# Patient Record
Sex: Female | Born: 1940 | Race: White | Hispanic: No | Marital: Married | Smoking: Never smoker
Health system: Southern US, Community
[De-identification: ages and names within clinical notes are randomized; demographics above are authoritative.]

## PROBLEM LIST (undated history)

## (undated) DIAGNOSIS — C4490 Unspecified malignant neoplasm of skin, unspecified: Secondary | ICD-10-CM

## (undated) DIAGNOSIS — G629 Polyneuropathy, unspecified: Secondary | ICD-10-CM

## (undated) DIAGNOSIS — T7840XA Allergy, unspecified, initial encounter: Secondary | ICD-10-CM

## (undated) DIAGNOSIS — J302 Other seasonal allergic rhinitis: Secondary | ICD-10-CM

## (undated) DIAGNOSIS — E039 Hypothyroidism, unspecified: Secondary | ICD-10-CM

## (undated) DIAGNOSIS — M1990 Unspecified osteoarthritis, unspecified site: Secondary | ICD-10-CM

## (undated) DIAGNOSIS — I4819 Other persistent atrial fibrillation: Secondary | ICD-10-CM

## (undated) DIAGNOSIS — I10 Essential (primary) hypertension: Secondary | ICD-10-CM

## (undated) DIAGNOSIS — IMO0002 Reserved for concepts with insufficient information to code with codable children: Secondary | ICD-10-CM

## (undated) DIAGNOSIS — G709 Myoneural disorder, unspecified: Secondary | ICD-10-CM

## (undated) DIAGNOSIS — G43909 Migraine, unspecified, not intractable, without status migrainosus: Secondary | ICD-10-CM

## (undated) DIAGNOSIS — K219 Gastro-esophageal reflux disease without esophagitis: Secondary | ICD-10-CM

## (undated) DIAGNOSIS — E785 Hyperlipidemia, unspecified: Secondary | ICD-10-CM

## (undated) DIAGNOSIS — I428 Other cardiomyopathies: Secondary | ICD-10-CM

## (undated) HISTORY — DX: Other seasonal allergic rhinitis: J30.2

## (undated) HISTORY — DX: Other cardiomyopathies: I42.8

## (undated) HISTORY — DX: Unspecified osteoarthritis, unspecified site: M19.90

## (undated) HISTORY — DX: Myoneural disorder, unspecified: G70.9

## (undated) HISTORY — PX: CATARACT EXTRACTION, BILATERAL: SHX1313

## (undated) HISTORY — DX: Essential (primary) hypertension: I10

## (undated) HISTORY — DX: Hypothyroidism, unspecified: E03.9

## (undated) HISTORY — DX: Other persistent atrial fibrillation: I48.19

## (undated) HISTORY — DX: Allergy, unspecified, initial encounter: T78.40XA

## (undated) HISTORY — DX: Polyneuropathy, unspecified: G62.9

## (undated) HISTORY — PX: COLONOSCOPY: SHX174

## (undated) HISTORY — DX: Gastro-esophageal reflux disease without esophagitis: K21.9

## (undated) HISTORY — PX: POLYPECTOMY: SHX149

## (undated) HISTORY — DX: Unspecified malignant neoplasm of skin, unspecified: C44.90

## (undated) HISTORY — DX: Reserved for concepts with insufficient information to code with codable children: IMO0002

## (undated) HISTORY — DX: Migraine, unspecified, not intractable, without status migrainosus: G43.909

## (undated) HISTORY — DX: Hyperlipidemia, unspecified: E78.5

---

## 1976-03-20 HISTORY — PX: TOTAL ABDOMINAL HYSTERECTOMY: SHX209

## 1999-07-14 ENCOUNTER — Other Ambulatory Visit: Admission: RE | Admit: 1999-07-14 | Discharge: 1999-07-14 | Payer: Self-pay | Admitting: Internal Medicine

## 1999-07-14 ENCOUNTER — Encounter (INDEPENDENT_AMBULATORY_CARE_PROVIDER_SITE_OTHER): Payer: Self-pay | Admitting: Specialist

## 2000-07-31 ENCOUNTER — Other Ambulatory Visit: Admission: RE | Admit: 2000-07-31 | Discharge: 2000-07-31 | Payer: Self-pay | Admitting: Obstetrics & Gynecology

## 2001-03-20 DIAGNOSIS — C4490 Unspecified malignant neoplasm of skin, unspecified: Secondary | ICD-10-CM | POA: Insufficient documentation

## 2001-03-20 HISTORY — DX: Unspecified malignant neoplasm of skin, unspecified: C44.90

## 2001-08-15 ENCOUNTER — Other Ambulatory Visit: Admission: RE | Admit: 2001-08-15 | Discharge: 2001-08-15 | Payer: Self-pay | Admitting: Obstetrics & Gynecology

## 2002-05-14 ENCOUNTER — Ambulatory Visit (HOSPITAL_COMMUNITY): Admission: RE | Admit: 2002-05-14 | Discharge: 2002-05-14 | Payer: Self-pay | Admitting: Neurosurgery

## 2002-05-14 ENCOUNTER — Encounter: Payer: Self-pay | Admitting: Neurosurgery

## 2002-08-25 ENCOUNTER — Other Ambulatory Visit: Admission: RE | Admit: 2002-08-25 | Discharge: 2002-08-25 | Payer: Self-pay | Admitting: Obstetrics & Gynecology

## 2003-03-21 HISTORY — PX: CERVICAL DISCECTOMY: SHX98

## 2003-06-24 ENCOUNTER — Inpatient Hospital Stay (HOSPITAL_COMMUNITY): Admission: RE | Admit: 2003-06-24 | Discharge: 2003-06-27 | Payer: Self-pay | Admitting: Neurosurgery

## 2003-09-18 ENCOUNTER — Other Ambulatory Visit: Admission: RE | Admit: 2003-09-18 | Discharge: 2003-09-18 | Payer: Self-pay | Admitting: Obstetrics & Gynecology

## 2004-01-20 ENCOUNTER — Ambulatory Visit: Payer: Self-pay | Admitting: Internal Medicine

## 2004-02-03 ENCOUNTER — Ambulatory Visit: Payer: Self-pay | Admitting: Internal Medicine

## 2004-02-10 ENCOUNTER — Encounter: Payer: Self-pay | Admitting: Internal Medicine

## 2004-02-10 ENCOUNTER — Ambulatory Visit (HOSPITAL_COMMUNITY): Admission: RE | Admit: 2004-02-10 | Discharge: 2004-02-10 | Payer: Self-pay | Admitting: Internal Medicine

## 2004-03-15 ENCOUNTER — Ambulatory Visit: Payer: Self-pay | Admitting: Internal Medicine

## 2004-03-20 HISTORY — PX: CHOLECYSTECTOMY: SHX55

## 2004-05-03 ENCOUNTER — Observation Stay (HOSPITAL_COMMUNITY): Admission: RE | Admit: 2004-05-03 | Discharge: 2004-05-04 | Payer: Self-pay | Admitting: Surgery

## 2004-05-03 ENCOUNTER — Encounter (INDEPENDENT_AMBULATORY_CARE_PROVIDER_SITE_OTHER): Payer: Self-pay | Admitting: *Deleted

## 2004-10-19 ENCOUNTER — Other Ambulatory Visit: Admission: RE | Admit: 2004-10-19 | Discharge: 2004-10-19 | Payer: Self-pay | Admitting: Obstetrics & Gynecology

## 2007-08-21 DIAGNOSIS — R519 Headache, unspecified: Secondary | ICD-10-CM

## 2007-08-21 DIAGNOSIS — I10 Essential (primary) hypertension: Secondary | ICD-10-CM | POA: Insufficient documentation

## 2007-08-21 DIAGNOSIS — Z860100 Personal history of colon polyps, unspecified: Secondary | ICD-10-CM | POA: Insufficient documentation

## 2007-08-21 DIAGNOSIS — E785 Hyperlipidemia, unspecified: Secondary | ICD-10-CM | POA: Insufficient documentation

## 2007-08-21 DIAGNOSIS — K5730 Diverticulosis of large intestine without perforation or abscess without bleeding: Secondary | ICD-10-CM

## 2007-08-21 DIAGNOSIS — R51 Headache: Secondary | ICD-10-CM

## 2007-08-21 DIAGNOSIS — Z87898 Personal history of other specified conditions: Secondary | ICD-10-CM | POA: Insufficient documentation

## 2007-08-21 DIAGNOSIS — K219 Gastro-esophageal reflux disease without esophagitis: Secondary | ICD-10-CM | POA: Insufficient documentation

## 2007-08-21 HISTORY — DX: Diverticulosis of large intestine without perforation or abscess without bleeding: K57.30

## 2007-08-21 HISTORY — DX: Personal history of colon polyps, unspecified: Z86.0100

## 2007-08-21 HISTORY — DX: Headache, unspecified: R51.9

## 2007-08-22 ENCOUNTER — Ambulatory Visit: Payer: Self-pay | Admitting: Internal Medicine

## 2007-08-22 DIAGNOSIS — M1990 Unspecified osteoarthritis, unspecified site: Secondary | ICD-10-CM | POA: Insufficient documentation

## 2007-08-22 DIAGNOSIS — M129 Arthropathy, unspecified: Secondary | ICD-10-CM | POA: Insufficient documentation

## 2007-08-22 DIAGNOSIS — M259 Joint disorder, unspecified: Secondary | ICD-10-CM

## 2007-08-22 DIAGNOSIS — M5416 Radiculopathy, lumbar region: Secondary | ICD-10-CM

## 2007-08-22 DIAGNOSIS — E039 Hypothyroidism, unspecified: Secondary | ICD-10-CM | POA: Insufficient documentation

## 2007-08-22 HISTORY — DX: Radiculopathy, lumbar region: M54.16

## 2007-08-22 HISTORY — DX: Joint disorder, unspecified: M25.9

## 2007-08-27 ENCOUNTER — Encounter: Payer: Self-pay | Admitting: Cardiology

## 2007-08-27 ENCOUNTER — Ambulatory Visit: Payer: Self-pay | Admitting: Surgery

## 2007-08-27 ENCOUNTER — Ambulatory Visit (HOSPITAL_COMMUNITY): Admission: RE | Admit: 2007-08-27 | Discharge: 2007-08-27 | Payer: Self-pay | Admitting: Internal Medicine

## 2007-08-30 ENCOUNTER — Encounter: Payer: Self-pay | Admitting: Internal Medicine

## 2007-08-30 ENCOUNTER — Ambulatory Visit: Payer: Self-pay | Admitting: Internal Medicine

## 2007-09-02 ENCOUNTER — Encounter: Payer: Self-pay | Admitting: Internal Medicine

## 2008-11-12 ENCOUNTER — Ambulatory Visit: Payer: Self-pay | Admitting: Internal Medicine

## 2008-11-12 DIAGNOSIS — D126 Benign neoplasm of colon, unspecified: Secondary | ICD-10-CM

## 2008-11-12 HISTORY — DX: Benign neoplasm of colon, unspecified: D12.6

## 2008-12-18 HISTORY — PX: TRANSTHORACIC ECHOCARDIOGRAM: SHX275

## 2009-03-15 ENCOUNTER — Telehealth: Payer: Self-pay | Admitting: Internal Medicine

## 2010-04-10 ENCOUNTER — Encounter: Payer: Self-pay | Admitting: Obstetrics & Gynecology

## 2010-08-05 NOTE — H&P (Signed)
NAME:  Bridget [name], Bridget [name]                         ACCOUNT NO.:  [id number]   MEDICAL RECORD NO.:  [id number]                   PATIENT TYPE:  INP   LOCATION:  NA                                   FACILITY:  [facility]   PHYSICIAN:  Hilda Lias, M.D.                DATE OF BIRTH:  12-09-1940   DATE OF ADMISSION:  DATE OF DISCHARGE:                                HISTORY & PHYSICAL   There was no dictation for this job.                                                Hilda Lias, M.D.    EB/MEDQ  D:  06/24/2003  T:  06/24/2003  Job:  [id number]

## 2010-08-05 NOTE — Op Note (Signed)
[name], [name]                         ACCOUNT NO.:  [id number]   MEDICAL RECORD NO.:  [id number]                   PATIENT TYPE:  INP   LOCATION:  2899                                 FACILITY:  [facility]   PHYSICIAN:  Hilda Lias, M.D.                DATE OF BIRTH:  July 04, 1940   DATE OF PROCEDURE:  06/24/2003  DATE OF DISCHARGE:                                 OPERATIVE REPORT   PREOPERATIVE DIAGNOSIS:  Cervical spondylosis, C3-4, 4-5, 5-6, 6-7 with  radiculopathy.   POSTOPERATIVE DIAGNOSES:  Cervical spondylosis, C3-4, 4-5, 5-6, 6-7 with  radiculopathy.   PROCEDURE:  Anterior 3-4, 4-5, 5-6, 6-7 decompression of the spinal column,  total diskectomy, bilateral foraminotomy, interbody fusion with allograft,  plate for C3 to C7, microscope.   SURGEON:  Hilda Lias, M.D.   ASSISTANT:  Dr. Newell Coral   CLINICAL HISTORY:  Bridget [name] is a lady who had been complaining of neck  pain with radiation to both upper extremities, right worse than the left  one.  Clinical history has weakness of the biceps, right tricep and neck  with extension, lateral recession.  X-ray showed severe case of spondylosis  at the level of 5-6, 6-7.  At level 4-5, 3-4, the patient already has some  narrowing centrally, as well as prominent stenosis.  We talked about surgery  and at the end she and her husband wanted to proceed with all four levels  because she did not want to have any more surgical procedures.  The patient  was aware that if we were to do only 2-3 level, the level of one below will  get worse.  The risk was explained in the history and physical, as well as  on my physical examination in my office.   PROCEDURE:  The patient was taken to the __________ ; the neck was prepped  with Betadine.  Longitudinal incision through the skin, platysma was carried  out.  X-ray showed indeed we were at the level of 5-6.  The patient had  quite a bit of osteophytes at the level of 4-5, 5-6, 6-7.  The  osteophytes  were removed with the Leksell.  Then, we entered the C6-7 space.  The area  was quite narrow up to the point that the disks were absolutely  degenerative.  We had to open the disk space using the drill.  With the  microscope, we were able to remove the posterior ligament and decompression  of the spinal cord at level 6-7 plus foraminotomy with removal of  osteophyte in that area was accomplished.  The same procedure was done at 5-  6.  The area was quite narrow.  At level 4-5, the patient also had continued  osteophytes and quite a bit central and laterally narrow secondary to  spondylosis.  Decompression of the spinal cord at this level, as well as the  level of 3-4 was accomplished.  At the end, we had good decompression of the  spinal column.  The nerve root #3, 4, 5, 6 and 7 were wide open.  Having  done this, hemostasis was done with the Gelfoam.  After patient had no more  evidence of bleeding, the Gelfoam was removed.  The area was irrigated.  The  end plate of this level was drilled, and four pieces of allograft, 6 mm  __________ were inserted.  This was followed by a plate from C3 down to C7  using pin and screws.  The lateral C spine showed good position of the  plate, as well as the bone graft.  From that level, the area was irrigated.  We waited 10 minutes and there was no evidence of any bleeding.  Nevertheless, because of the __________ we decided to leave a Jackson-Pratt  drain.  The area was closed using Vicryl and Steri-Strips.                                               Hilda Lias, M.D.   EB/MEDQ  D:  06/24/2003  T:  06/25/2003  Job:  [id number]

## 2010-08-05 NOTE — H&P (Signed)
NAME:  Bridget [name], Bridget [name]                         ACCOUNT NO.:  [id number]   MEDICAL RECORD NO.:  [id number]                   PATIENT TYPE:  INP   LOCATION:  3004                                 FACILITY:  [facility]   PHYSICIAN:  Hilda Lias, M.D.                DATE OF BIRTH:  10-04-1940   DATE OF ADMISSION:  06/24/2003  DATE OF DISCHARGE:                                HISTORY & PHYSICAL   HISTORY:  This is a lady who came to see me two months ago because of neck  pain with pain going to the right upper extremity, associated with numbness,  weakness and difficulty driving.  Some time lately she had been developing  pain in the left upper extremity.  She complained of some pain which had  mostly also localized to the hip area.  The patient denied weakness in the  lower extremity, but she is getting weak in the right upper extremity.   About eight years ago, I saw this lady and the diagnoses of 5-6, 6-7  spondylosis was done.  She denies any problem with bladder or bowel.  She  feels that she is getting worse lately.  The patient had cervical spine MRI  and because of the findings, she wanted to proceed with surgery.   PAST MEDICAL HISTORY:  Hysterectomy, appendectomy.   ALLERGIES:  CODEINE, SULFA AND BELLERGAL.   SOCIAL HISTORY:  Negative.   FAMILY HISTORY:  Mother died of a stroke.  Father died of heart disease when  he was 69 years old.   PHYSICAL EXAMINATION:  HEENT:  Head, ears and  throat normal.  Nose is  clear.  NECK:  She is able to flex, but extension and left rotation causes  discomfort. There is some tenderness in the trapezius muscle.  HEART:  Sounds are normal.  ABDOMEN:  Normal.  NEUROLOGIC:  Neuromotor is grossly normal.  Cranial nerves normal.  Strength:  She has a normal deltoid.  I can break easily both biceps and  both wrist extensors. She has a 3/5 weakness in the right triceps with  normal in the left one.  Thenar and hypothenar muscles are normal.   Sensory  examination normal.  Reflexes are symmetrical with decrease in both biceps.  Coordination normal.   Cervical spine x-ray showed spondylosis at the level of 3-4, 4-5, 5-6, 6-7.  The MRI showed that she has spondylosis, not only at 5-6 and 6-7, but also  between 4-5 and 3-4.   IMPRESSION:  Cervical spondylosis from C3 down to C6-7.   RECOMMENDATION:  I talked to her and her husband on different occasions.  There is no question that clinically, her problems are coming from the 5-6  and 6-7, but I worry about the mild spondylosis, and foraminal stenosis that  she has at the level of 3-4, and 4-5.  We talked about doing only those two  lower levels and  see how she does with the other two although she knows that  in the future, this might get worse because of the fusion and she might  require a second surgical procedure.  Nevertheless, after several talks to  both of them, they wanted to proceed with a full  level of the cervical discectomy.  The surgery was fully explained to them  on several occasions including the risks such as damage to the vertebral  artery, stroke, damage to the soft palate or trachea, failure of the bone  graft, failure of the plate and need for further surgery, and no improvement  whatsoever.                                                Hilda Lias, M.D.    EB/MEDQ  D:  06/24/2003  T:  06/25/2003  Job:  [id number]

## 2010-08-05 NOTE — Op Note (Signed)
NAMESHABNAM, SHILLINGFORD               ACCOUNT NO.:  [id number]   MEDICAL RECORD NO.:  [id number]          PATIENT TYPE:  AMB   LOCATION:  DAY                          FACILITY:  [facility]   PHYSICIAN:  Sandria Bales. Ezzard Standing, M.D.  DATE OF BIRTH:  1940/03/23   DATE OF PROCEDURE:  05/03/2004  DATE OF DISCHARGE:                                 OPERATIVE REPORT   PREOPERATIVE DIAGNOSIS:  Chronic cholecystitis with cholelithiasis.   POSTOPERATIVE DIAGNOSIS:  Chronic cholecystitis with cholelithiasis.   PROCEDURES:  1.  Laparoscopic cholecystectomy.  2.  Intraoperative cholangiogram.   SURGEON:  Sandria Bales. Ezzard Standing, M.D.   FIRST ASSISTANT:  Gita Kudo, M.D.   ANESTHESIA:  General endotracheal.   ESTIMATED BLOOD LOSS:  Minimal.   INDICATION FOR PROCEDURE:  Ms. Heidtke is a 70 year old white female who is a  patient of Dr. Lawana Pai in [place], [place].  She sees Lina Sar,  M.D.  She has had some vague abdominal pain.  She had a CT scan which  revealed a 2.3 cm gallstone.  She now comes for attempted laparoscopic  cholecystectomy.  The indications and potential complications were explained  to the patient.   The potential complications, include, but are not limited to bleeding,  infection, bile duct injury and open surgery.   OPERATIVE NOTE:  The patient was placed in the supine position and given a  general endotracheal anesthetic as supervised by Jenelle Mages. Rica Mast, M.D.  Her abdomen was prepped with Betadine solution and sterilely draped.  She  was given 1 g of Ancef at the initiation of the procedure.   I used the [place] Surgical port.  I used the 10 mm Husson for umbilicus.  I used  a 10 mm subxiphoid and then a right mid subcostal 5 mm and right lateral  subcostal 5 mm.   I first did an exploration.  The right and left lobes of the liver were  unremarkable.  The stomach was unremarkable.  The bowel that  I see was  unremarkable.  The gallbladder was identified.  I grasped the  gallbladder  and rotated it cephalad.  Interestingly, the patient had sort of a  __________ deformity of the gallbladder at the junction of the cystic duct.  This was sort of attached to the common bile duct.  I divided these off.   I then placed a clip on the gallbladder side of the cystic duct.  I then  shot an intraoperative cholangiogram.   Intraoperative cholangiogram was shot using a cut off taut catheter placed  through a 14 gauge Jelco into the cut side of the cystic duct.  A clip was  then used to secure the taut catheter.   I used half-strength Hypaque solution about 6 mL and injected this into the  cystic duct, down the common bile duct and into the duodenum.  It also went  up the hepatic radicles.  The hepatic bile duct was noted to be somewhat  dilated and large with no filling defect and it emptied promptly into the  duodenum.   The cystic duct was then  triply endoclipped and divided.  The cystic artery  was doubly endoclipped and divided.   The gallbladder was divided from the gallbladder bed.  The triangle of Calot  and the gallbladder bed were visualized.  There was no bleeding and no bile  leak.  It was placed into an EndoCatch bag delivered through the umbilicus.   The umbilical port was then closed with a 0 Vicryl suture.  The abdomen was  then irrigated.  The umbilical port was closed with a 0 Vicryl suture.  The  skin at each site was closed with a 5-0 Monocryl suture and painted with  tincture of Benzoin and steri-stripped.   The patient tolerated the procedure well and was transported to the recovery  room in good condition.  The sponge and needle counts were correct at the  end of the case.      DHN/MEDQ  D:  05/03/2004  T:  05/03/2004  Job:  [id number]   cc:   Lina Sar, M.D. [facility]   Roney Marion, M.D.  [place], [place]

## 2010-08-05 NOTE — Discharge Summary (Signed)
NAMENORELLE, RUNNION                         ACCOUNT NO.:  [id number]   MEDICAL RECORD NO.:  [id number]                   PATIENT TYPE:  INP   LOCATION:  3004                                 FACILITY:  [facility]   PHYSICIAN:  Hilda Lias, M.D.                DATE OF BIRTH:  1940-11-24   DATE OF ADMISSION:  06/24/2003  DATE OF DISCHARGE:  06/27/2003                                 DISCHARGE SUMMARY   ADMISSION DIAGNOSIS:  C3-C4, C4-C5, C5-C6, and C6-C7 spondylosis with  stenosis.   FINAL DIAGNOSIS:  C3-C4, C4-C5, C5-C6, and C6-C7 spondylosis with stenosis.   CLINICAL HISTORY:  The patient was admitted because of neck pain with  radiation to the upper extremity associated with weakness.   X-ray showed stenosis from C3-C4 down to C6-C7.  Surgery was advised.   LABORATORY DATA:  Normal.   HOSPITAL COURSE:  The patient was taken to surgery and anterior cervical  decompression was done.  After that, the patient had a drain inserted  because of a small amount of epidural __________ bleeding.   The patient was stable, and 24 hours later, the drain was removed.  The  patient was able to walk without any difficulty.   By February 9, she was stable, and she was ready to go home.   DISCHARGE CONDITION:  Improvement.   MEDICATIONS:  1. Percocet.  2. Diazepam.   DISCHARGE DIET:  Regular.   DISCHARGE ACTIVITY:  She was not to drive for at least 10 days.   FOLLOWUP:  She will be seen by me in four weeks.                                                Hilda Lias, M.D.    EB/MEDQ  D:  07/17/2003  T:  07/18/2003  Job:  [id number]

## 2011-04-04 DIAGNOSIS — M169 Osteoarthritis of hip, unspecified: Secondary | ICD-10-CM | POA: Diagnosis not present

## 2011-04-19 DIAGNOSIS — Z23 Encounter for immunization: Secondary | ICD-10-CM | POA: Diagnosis not present

## 2011-04-19 DIAGNOSIS — I10 Essential (primary) hypertension: Secondary | ICD-10-CM | POA: Diagnosis not present

## 2011-04-19 DIAGNOSIS — E782 Mixed hyperlipidemia: Secondary | ICD-10-CM | POA: Diagnosis not present

## 2011-04-19 DIAGNOSIS — R7301 Impaired fasting glucose: Secondary | ICD-10-CM | POA: Diagnosis not present

## 2011-04-20 DIAGNOSIS — I10 Essential (primary) hypertension: Secondary | ICD-10-CM | POA: Diagnosis not present

## 2011-04-20 DIAGNOSIS — E039 Hypothyroidism, unspecified: Secondary | ICD-10-CM | POA: Diagnosis not present

## 2011-04-20 DIAGNOSIS — E782 Mixed hyperlipidemia: Secondary | ICD-10-CM | POA: Diagnosis not present

## 2011-04-21 HISTORY — PX: TOTAL HIP ARTHROPLASTY: SHX124

## 2011-04-24 DIAGNOSIS — L538 Other specified erythematous conditions: Secondary | ICD-10-CM | POA: Diagnosis not present

## 2011-04-24 DIAGNOSIS — L821 Other seborrheic keratosis: Secondary | ICD-10-CM | POA: Diagnosis not present

## 2011-04-24 DIAGNOSIS — L570 Actinic keratosis: Secondary | ICD-10-CM | POA: Diagnosis not present

## 2011-05-03 DIAGNOSIS — G5790 Unspecified mononeuropathy of unspecified lower limb: Secondary | ICD-10-CM | POA: Diagnosis not present

## 2011-05-03 DIAGNOSIS — I10 Essential (primary) hypertension: Secondary | ICD-10-CM | POA: Diagnosis not present

## 2011-05-03 DIAGNOSIS — Z01818 Encounter for other preprocedural examination: Secondary | ICD-10-CM | POA: Diagnosis not present

## 2011-05-03 DIAGNOSIS — M169 Osteoarthritis of hip, unspecified: Secondary | ICD-10-CM | POA: Diagnosis not present

## 2011-05-03 DIAGNOSIS — F329 Major depressive disorder, single episode, unspecified: Secondary | ICD-10-CM | POA: Diagnosis not present

## 2011-05-03 DIAGNOSIS — G43909 Migraine, unspecified, not intractable, without status migrainosus: Secondary | ICD-10-CM | POA: Diagnosis not present

## 2011-05-03 DIAGNOSIS — Z79899 Other long term (current) drug therapy: Secondary | ICD-10-CM | POA: Diagnosis not present

## 2011-05-03 DIAGNOSIS — E039 Hypothyroidism, unspecified: Secondary | ICD-10-CM | POA: Diagnosis not present

## 2011-05-03 DIAGNOSIS — E785 Hyperlipidemia, unspecified: Secondary | ICD-10-CM | POA: Diagnosis not present

## 2011-05-11 DIAGNOSIS — G5790 Unspecified mononeuropathy of unspecified lower limb: Secondary | ICD-10-CM | POA: Diagnosis present

## 2011-05-11 DIAGNOSIS — E785 Hyperlipidemia, unspecified: Secondary | ICD-10-CM | POA: Diagnosis present

## 2011-05-11 DIAGNOSIS — E039 Hypothyroidism, unspecified: Secondary | ICD-10-CM | POA: Diagnosis present

## 2011-05-11 DIAGNOSIS — M1610 Unilateral primary osteoarthritis, unspecified hip: Secondary | ICD-10-CM | POA: Diagnosis not present

## 2011-05-11 DIAGNOSIS — Z882 Allergy status to sulfonamides status: Secondary | ICD-10-CM | POA: Diagnosis not present

## 2011-05-11 DIAGNOSIS — I10 Essential (primary) hypertension: Secondary | ICD-10-CM | POA: Diagnosis present

## 2011-05-11 DIAGNOSIS — E349 Endocrine disorder, unspecified: Secondary | ICD-10-CM | POA: Diagnosis present

## 2011-05-11 DIAGNOSIS — M169 Osteoarthritis of hip, unspecified: Secondary | ICD-10-CM | POA: Diagnosis not present

## 2011-05-11 DIAGNOSIS — Z96649 Presence of unspecified artificial hip joint: Secondary | ICD-10-CM | POA: Diagnosis not present

## 2011-05-11 DIAGNOSIS — F329 Major depressive disorder, single episode, unspecified: Secondary | ICD-10-CM | POA: Diagnosis present

## 2011-05-11 DIAGNOSIS — Z471 Aftercare following joint replacement surgery: Secondary | ICD-10-CM | POA: Diagnosis not present

## 2011-05-11 DIAGNOSIS — Z886 Allergy status to analgesic agent status: Secondary | ICD-10-CM | POA: Diagnosis not present

## 2011-05-11 DIAGNOSIS — G43909 Migraine, unspecified, not intractable, without status migrainosus: Secondary | ICD-10-CM | POA: Diagnosis present

## 2011-05-11 DIAGNOSIS — Z79899 Other long term (current) drug therapy: Secondary | ICD-10-CM | POA: Diagnosis not present

## 2011-05-14 DIAGNOSIS — IMO0001 Reserved for inherently not codable concepts without codable children: Secondary | ICD-10-CM | POA: Diagnosis not present

## 2011-05-14 DIAGNOSIS — Z96649 Presence of unspecified artificial hip joint: Secondary | ICD-10-CM | POA: Diagnosis not present

## 2011-05-14 DIAGNOSIS — I10 Essential (primary) hypertension: Secondary | ICD-10-CM | POA: Diagnosis not present

## 2011-05-14 DIAGNOSIS — M169 Osteoarthritis of hip, unspecified: Secondary | ICD-10-CM | POA: Diagnosis not present

## 2011-05-14 DIAGNOSIS — Z85828 Personal history of other malignant neoplasm of skin: Secondary | ICD-10-CM | POA: Diagnosis not present

## 2011-05-14 DIAGNOSIS — Z471 Aftercare following joint replacement surgery: Secondary | ICD-10-CM | POA: Diagnosis not present

## 2011-05-15 DIAGNOSIS — Z471 Aftercare following joint replacement surgery: Secondary | ICD-10-CM | POA: Diagnosis not present

## 2011-05-15 DIAGNOSIS — Z96649 Presence of unspecified artificial hip joint: Secondary | ICD-10-CM | POA: Diagnosis not present

## 2011-05-15 DIAGNOSIS — Z85828 Personal history of other malignant neoplasm of skin: Secondary | ICD-10-CM | POA: Diagnosis not present

## 2011-05-15 DIAGNOSIS — I10 Essential (primary) hypertension: Secondary | ICD-10-CM | POA: Diagnosis not present

## 2011-05-15 DIAGNOSIS — IMO0001 Reserved for inherently not codable concepts without codable children: Secondary | ICD-10-CM | POA: Diagnosis not present

## 2011-05-17 DIAGNOSIS — Z85828 Personal history of other malignant neoplasm of skin: Secondary | ICD-10-CM | POA: Diagnosis not present

## 2011-05-17 DIAGNOSIS — IMO0001 Reserved for inherently not codable concepts without codable children: Secondary | ICD-10-CM | POA: Diagnosis not present

## 2011-05-17 DIAGNOSIS — Z96649 Presence of unspecified artificial hip joint: Secondary | ICD-10-CM | POA: Diagnosis not present

## 2011-05-17 DIAGNOSIS — I10 Essential (primary) hypertension: Secondary | ICD-10-CM | POA: Diagnosis not present

## 2011-05-17 DIAGNOSIS — Z471 Aftercare following joint replacement surgery: Secondary | ICD-10-CM | POA: Diagnosis not present

## 2011-05-18 DIAGNOSIS — Z471 Aftercare following joint replacement surgery: Secondary | ICD-10-CM | POA: Diagnosis not present

## 2011-05-18 DIAGNOSIS — IMO0001 Reserved for inherently not codable concepts without codable children: Secondary | ICD-10-CM | POA: Diagnosis not present

## 2011-05-18 DIAGNOSIS — Z96649 Presence of unspecified artificial hip joint: Secondary | ICD-10-CM | POA: Diagnosis not present

## 2011-05-18 DIAGNOSIS — Z85828 Personal history of other malignant neoplasm of skin: Secondary | ICD-10-CM | POA: Diagnosis not present

## 2011-05-18 DIAGNOSIS — I10 Essential (primary) hypertension: Secondary | ICD-10-CM | POA: Diagnosis not present

## 2011-05-22 DIAGNOSIS — I10 Essential (primary) hypertension: Secondary | ICD-10-CM | POA: Diagnosis not present

## 2011-05-22 DIAGNOSIS — Z96649 Presence of unspecified artificial hip joint: Secondary | ICD-10-CM | POA: Diagnosis not present

## 2011-05-22 DIAGNOSIS — IMO0001 Reserved for inherently not codable concepts without codable children: Secondary | ICD-10-CM | POA: Diagnosis not present

## 2011-05-22 DIAGNOSIS — Z471 Aftercare following joint replacement surgery: Secondary | ICD-10-CM | POA: Diagnosis not present

## 2011-05-22 DIAGNOSIS — Z85828 Personal history of other malignant neoplasm of skin: Secondary | ICD-10-CM | POA: Diagnosis not present

## 2011-05-24 DIAGNOSIS — Z85828 Personal history of other malignant neoplasm of skin: Secondary | ICD-10-CM | POA: Diagnosis not present

## 2011-05-24 DIAGNOSIS — Z96649 Presence of unspecified artificial hip joint: Secondary | ICD-10-CM | POA: Diagnosis not present

## 2011-05-24 DIAGNOSIS — IMO0001 Reserved for inherently not codable concepts without codable children: Secondary | ICD-10-CM | POA: Diagnosis not present

## 2011-05-24 DIAGNOSIS — Z471 Aftercare following joint replacement surgery: Secondary | ICD-10-CM | POA: Diagnosis not present

## 2011-05-24 DIAGNOSIS — I10 Essential (primary) hypertension: Secondary | ICD-10-CM | POA: Diagnosis not present

## 2011-05-26 DIAGNOSIS — Z471 Aftercare following joint replacement surgery: Secondary | ICD-10-CM | POA: Diagnosis not present

## 2011-05-26 DIAGNOSIS — IMO0001 Reserved for inherently not codable concepts without codable children: Secondary | ICD-10-CM | POA: Diagnosis not present

## 2011-05-26 DIAGNOSIS — Z96649 Presence of unspecified artificial hip joint: Secondary | ICD-10-CM | POA: Diagnosis not present

## 2011-05-26 DIAGNOSIS — I10 Essential (primary) hypertension: Secondary | ICD-10-CM | POA: Diagnosis not present

## 2011-05-26 DIAGNOSIS — Z85828 Personal history of other malignant neoplasm of skin: Secondary | ICD-10-CM | POA: Diagnosis not present

## 2011-05-30 DIAGNOSIS — Z471 Aftercare following joint replacement surgery: Secondary | ICD-10-CM | POA: Diagnosis not present

## 2011-05-30 DIAGNOSIS — Z96649 Presence of unspecified artificial hip joint: Secondary | ICD-10-CM | POA: Diagnosis not present

## 2011-05-30 DIAGNOSIS — I10 Essential (primary) hypertension: Secondary | ICD-10-CM | POA: Diagnosis not present

## 2011-05-30 DIAGNOSIS — Z85828 Personal history of other malignant neoplasm of skin: Secondary | ICD-10-CM | POA: Diagnosis not present

## 2011-05-30 DIAGNOSIS — IMO0001 Reserved for inherently not codable concepts without codable children: Secondary | ICD-10-CM | POA: Diagnosis not present

## 2011-05-31 DIAGNOSIS — IMO0001 Reserved for inherently not codable concepts without codable children: Secondary | ICD-10-CM | POA: Diagnosis not present

## 2011-05-31 DIAGNOSIS — Z85828 Personal history of other malignant neoplasm of skin: Secondary | ICD-10-CM | POA: Diagnosis not present

## 2011-05-31 DIAGNOSIS — Z471 Aftercare following joint replacement surgery: Secondary | ICD-10-CM | POA: Diagnosis not present

## 2011-05-31 DIAGNOSIS — Z96649 Presence of unspecified artificial hip joint: Secondary | ICD-10-CM | POA: Diagnosis not present

## 2011-05-31 DIAGNOSIS — I10 Essential (primary) hypertension: Secondary | ICD-10-CM | POA: Diagnosis not present

## 2011-06-02 DIAGNOSIS — I10 Essential (primary) hypertension: Secondary | ICD-10-CM | POA: Diagnosis not present

## 2011-06-02 DIAGNOSIS — Z96649 Presence of unspecified artificial hip joint: Secondary | ICD-10-CM | POA: Diagnosis not present

## 2011-06-02 DIAGNOSIS — Z85828 Personal history of other malignant neoplasm of skin: Secondary | ICD-10-CM | POA: Diagnosis not present

## 2011-06-02 DIAGNOSIS — Z471 Aftercare following joint replacement surgery: Secondary | ICD-10-CM | POA: Diagnosis not present

## 2011-06-02 DIAGNOSIS — IMO0001 Reserved for inherently not codable concepts without codable children: Secondary | ICD-10-CM | POA: Diagnosis not present

## 2011-06-05 DIAGNOSIS — IMO0001 Reserved for inherently not codable concepts without codable children: Secondary | ICD-10-CM | POA: Diagnosis not present

## 2011-06-05 DIAGNOSIS — Z96649 Presence of unspecified artificial hip joint: Secondary | ICD-10-CM | POA: Diagnosis not present

## 2011-06-05 DIAGNOSIS — Z85828 Personal history of other malignant neoplasm of skin: Secondary | ICD-10-CM | POA: Diagnosis not present

## 2011-06-05 DIAGNOSIS — I10 Essential (primary) hypertension: Secondary | ICD-10-CM | POA: Diagnosis not present

## 2011-06-05 DIAGNOSIS — Z471 Aftercare following joint replacement surgery: Secondary | ICD-10-CM | POA: Diagnosis not present

## 2011-06-08 DIAGNOSIS — IMO0001 Reserved for inherently not codable concepts without codable children: Secondary | ICD-10-CM | POA: Diagnosis not present

## 2011-06-08 DIAGNOSIS — I10 Essential (primary) hypertension: Secondary | ICD-10-CM | POA: Diagnosis not present

## 2011-06-08 DIAGNOSIS — Z85828 Personal history of other malignant neoplasm of skin: Secondary | ICD-10-CM | POA: Diagnosis not present

## 2011-06-08 DIAGNOSIS — Z96649 Presence of unspecified artificial hip joint: Secondary | ICD-10-CM | POA: Diagnosis not present

## 2011-06-08 DIAGNOSIS — Z471 Aftercare following joint replacement surgery: Secondary | ICD-10-CM | POA: Diagnosis not present

## 2011-06-09 DIAGNOSIS — Z85828 Personal history of other malignant neoplasm of skin: Secondary | ICD-10-CM | POA: Diagnosis not present

## 2011-06-09 DIAGNOSIS — Z471 Aftercare following joint replacement surgery: Secondary | ICD-10-CM | POA: Diagnosis not present

## 2011-06-09 DIAGNOSIS — IMO0001 Reserved for inherently not codable concepts without codable children: Secondary | ICD-10-CM | POA: Diagnosis not present

## 2011-06-09 DIAGNOSIS — Z09 Encounter for follow-up examination after completed treatment for conditions other than malignant neoplasm: Secondary | ICD-10-CM | POA: Diagnosis not present

## 2011-06-09 DIAGNOSIS — Z96649 Presence of unspecified artificial hip joint: Secondary | ICD-10-CM | POA: Diagnosis not present

## 2011-06-09 DIAGNOSIS — I10 Essential (primary) hypertension: Secondary | ICD-10-CM | POA: Diagnosis not present

## 2011-06-21 DIAGNOSIS — G43119 Migraine with aura, intractable, without status migrainosus: Secondary | ICD-10-CM | POA: Diagnosis not present

## 2011-06-21 DIAGNOSIS — M359 Systemic involvement of connective tissue, unspecified: Secondary | ICD-10-CM | POA: Diagnosis not present

## 2011-08-01 DIAGNOSIS — M1710 Unilateral primary osteoarthritis, unspecified knee: Secondary | ICD-10-CM | POA: Diagnosis not present

## 2011-09-20 DIAGNOSIS — G43119 Migraine with aura, intractable, without status migrainosus: Secondary | ICD-10-CM | POA: Diagnosis not present

## 2011-09-20 DIAGNOSIS — M359 Systemic involvement of connective tissue, unspecified: Secondary | ICD-10-CM | POA: Diagnosis not present

## 2011-10-05 DIAGNOSIS — E039 Hypothyroidism, unspecified: Secondary | ICD-10-CM | POA: Diagnosis not present

## 2011-11-07 DIAGNOSIS — M1710 Unilateral primary osteoarthritis, unspecified knee: Secondary | ICD-10-CM | POA: Diagnosis not present

## 2011-11-15 DIAGNOSIS — K1370 Unspecified lesions of oral mucosa: Secondary | ICD-10-CM | POA: Diagnosis not present

## 2011-11-23 DIAGNOSIS — K1370 Unspecified lesions of oral mucosa: Secondary | ICD-10-CM | POA: Diagnosis not present

## 2011-11-30 DIAGNOSIS — L821 Other seborrheic keratosis: Secondary | ICD-10-CM | POA: Diagnosis not present

## 2011-11-30 DIAGNOSIS — L820 Inflamed seborrheic keratosis: Secondary | ICD-10-CM | POA: Diagnosis not present

## 2011-12-22 DIAGNOSIS — Z23 Encounter for immunization: Secondary | ICD-10-CM | POA: Diagnosis not present

## 2012-01-24 DIAGNOSIS — I10 Essential (primary) hypertension: Secondary | ICD-10-CM | POA: Diagnosis not present

## 2012-01-24 DIAGNOSIS — E782 Mixed hyperlipidemia: Secondary | ICD-10-CM | POA: Diagnosis not present

## 2012-02-05 DIAGNOSIS — Z01419 Encounter for gynecological examination (general) (routine) without abnormal findings: Secondary | ICD-10-CM | POA: Diagnosis not present

## 2012-02-05 DIAGNOSIS — Z130 Encounter for screening for diseases of the blood and blood-forming organs and certain disorders involving the immune mechanism: Secondary | ICD-10-CM | POA: Diagnosis not present

## 2012-02-05 DIAGNOSIS — Z13820 Encounter for screening for osteoporosis: Secondary | ICD-10-CM | POA: Diagnosis not present

## 2012-02-27 DIAGNOSIS — M1710 Unilateral primary osteoarthritis, unspecified knee: Secondary | ICD-10-CM | POA: Diagnosis not present

## 2012-02-27 DIAGNOSIS — Z1231 Encounter for screening mammogram for malignant neoplasm of breast: Secondary | ICD-10-CM | POA: Diagnosis not present

## 2012-04-24 DIAGNOSIS — M940 Chondrocostal junction syndrome [Tietze]: Secondary | ICD-10-CM | POA: Diagnosis not present

## 2012-04-30 DIAGNOSIS — M1710 Unilateral primary osteoarthritis, unspecified knee: Secondary | ICD-10-CM | POA: Diagnosis not present

## 2012-05-15 DIAGNOSIS — Z01818 Encounter for other preprocedural examination: Secondary | ICD-10-CM | POA: Diagnosis not present

## 2012-05-15 DIAGNOSIS — I10 Essential (primary) hypertension: Secondary | ICD-10-CM | POA: Diagnosis not present

## 2012-05-15 DIAGNOSIS — E782 Mixed hyperlipidemia: Secondary | ICD-10-CM | POA: Diagnosis not present

## 2012-05-15 DIAGNOSIS — E039 Hypothyroidism, unspecified: Secondary | ICD-10-CM | POA: Diagnosis not present

## 2012-05-18 HISTORY — PX: REPLACEMENT TOTAL KNEE: SUR1224

## 2012-05-21 DIAGNOSIS — L719 Rosacea, unspecified: Secondary | ICD-10-CM | POA: Diagnosis not present

## 2012-06-03 DIAGNOSIS — R4182 Altered mental status, unspecified: Secondary | ICD-10-CM | POA: Diagnosis not present

## 2012-06-03 DIAGNOSIS — G609 Hereditary and idiopathic neuropathy, unspecified: Secondary | ICD-10-CM | POA: Diagnosis present

## 2012-06-03 DIAGNOSIS — M6281 Muscle weakness (generalized): Secondary | ICD-10-CM | POA: Diagnosis not present

## 2012-06-03 DIAGNOSIS — M6250 Muscle wasting and atrophy, not elsewhere classified, unspecified site: Secondary | ICD-10-CM | POA: Diagnosis not present

## 2012-06-03 DIAGNOSIS — Z882 Allergy status to sulfonamides status: Secondary | ICD-10-CM | POA: Diagnosis not present

## 2012-06-03 DIAGNOSIS — IMO0002 Reserved for concepts with insufficient information to code with codable children: Secondary | ICD-10-CM | POA: Diagnosis not present

## 2012-06-03 DIAGNOSIS — N3010 Interstitial cystitis (chronic) without hematuria: Secondary | ICD-10-CM | POA: Diagnosis present

## 2012-06-03 DIAGNOSIS — M25569 Pain in unspecified knee: Secondary | ICD-10-CM | POA: Diagnosis not present

## 2012-06-03 DIAGNOSIS — Z01818 Encounter for other preprocedural examination: Secondary | ICD-10-CM | POA: Diagnosis not present

## 2012-06-03 DIAGNOSIS — Z96659 Presence of unspecified artificial knee joint: Secondary | ICD-10-CM | POA: Diagnosis not present

## 2012-06-03 DIAGNOSIS — I10 Essential (primary) hypertension: Secondary | ICD-10-CM | POA: Diagnosis not present

## 2012-06-03 DIAGNOSIS — F29 Unspecified psychosis not due to a substance or known physiological condition: Secondary | ICD-10-CM | POA: Diagnosis not present

## 2012-06-03 DIAGNOSIS — Z471 Aftercare following joint replacement surgery: Secondary | ICD-10-CM | POA: Diagnosis not present

## 2012-06-03 DIAGNOSIS — R262 Difficulty in walking, not elsewhere classified: Secondary | ICD-10-CM | POA: Diagnosis not present

## 2012-06-03 DIAGNOSIS — M1990 Unspecified osteoarthritis, unspecified site: Secondary | ICD-10-CM | POA: Diagnosis not present

## 2012-06-03 DIAGNOSIS — M1710 Unilateral primary osteoarthritis, unspecified knee: Secondary | ICD-10-CM | POA: Diagnosis present

## 2012-06-03 DIAGNOSIS — K219 Gastro-esophageal reflux disease without esophagitis: Secondary | ICD-10-CM | POA: Diagnosis present

## 2012-06-03 DIAGNOSIS — R269 Unspecified abnormalities of gait and mobility: Secondary | ICD-10-CM | POA: Diagnosis not present

## 2012-06-14 DIAGNOSIS — K219 Gastro-esophageal reflux disease without esophagitis: Secondary | ICD-10-CM | POA: Diagnosis not present

## 2012-06-14 DIAGNOSIS — I10 Essential (primary) hypertension: Secondary | ICD-10-CM | POA: Diagnosis not present

## 2012-06-14 DIAGNOSIS — M6281 Muscle weakness (generalized): Secondary | ICD-10-CM | POA: Diagnosis not present

## 2012-06-14 DIAGNOSIS — Z96659 Presence of unspecified artificial knee joint: Secondary | ICD-10-CM | POA: Diagnosis not present

## 2012-06-14 DIAGNOSIS — R269 Unspecified abnormalities of gait and mobility: Secondary | ICD-10-CM | POA: Diagnosis not present

## 2012-06-14 DIAGNOSIS — M1990 Unspecified osteoarthritis, unspecified site: Secondary | ICD-10-CM | POA: Diagnosis not present

## 2012-06-14 DIAGNOSIS — E782 Mixed hyperlipidemia: Secondary | ICD-10-CM | POA: Diagnosis not present

## 2012-06-14 DIAGNOSIS — K2100 Gastro-esophageal reflux disease with esophagitis, without bleeding: Secondary | ICD-10-CM | POA: Diagnosis not present

## 2012-06-14 DIAGNOSIS — M6250 Muscle wasting and atrophy, not elsewhere classified, unspecified site: Secondary | ICD-10-CM | POA: Diagnosis not present

## 2012-06-14 DIAGNOSIS — M25569 Pain in unspecified knee: Secondary | ICD-10-CM | POA: Diagnosis not present

## 2012-06-14 DIAGNOSIS — G43001 Migraine without aura, not intractable, with status migrainosus: Secondary | ICD-10-CM | POA: Diagnosis not present

## 2012-06-14 DIAGNOSIS — R4182 Altered mental status, unspecified: Secondary | ICD-10-CM | POA: Diagnosis not present

## 2012-06-14 DIAGNOSIS — R262 Difficulty in walking, not elsewhere classified: Secondary | ICD-10-CM | POA: Diagnosis not present

## 2012-06-25 DIAGNOSIS — K2100 Gastro-esophageal reflux disease with esophagitis, without bleeding: Secondary | ICD-10-CM | POA: Diagnosis not present

## 2012-06-25 DIAGNOSIS — R269 Unspecified abnormalities of gait and mobility: Secondary | ICD-10-CM | POA: Diagnosis not present

## 2012-06-25 DIAGNOSIS — G43001 Migraine without aura, not intractable, with status migrainosus: Secondary | ICD-10-CM | POA: Diagnosis not present

## 2012-06-25 DIAGNOSIS — E782 Mixed hyperlipidemia: Secondary | ICD-10-CM | POA: Diagnosis not present

## 2012-07-01 DIAGNOSIS — M1990 Unspecified osteoarthritis, unspecified site: Secondary | ICD-10-CM | POA: Diagnosis not present

## 2012-07-01 DIAGNOSIS — M6281 Muscle weakness (generalized): Secondary | ICD-10-CM | POA: Diagnosis not present

## 2012-07-01 DIAGNOSIS — Z96659 Presence of unspecified artificial knee joint: Secondary | ICD-10-CM | POA: Diagnosis not present

## 2012-07-03 DIAGNOSIS — Z96659 Presence of unspecified artificial knee joint: Secondary | ICD-10-CM | POA: Diagnosis not present

## 2012-07-03 DIAGNOSIS — M6281 Muscle weakness (generalized): Secondary | ICD-10-CM | POA: Diagnosis not present

## 2012-07-03 DIAGNOSIS — M1990 Unspecified osteoarthritis, unspecified site: Secondary | ICD-10-CM | POA: Diagnosis not present

## 2012-07-08 DIAGNOSIS — M6281 Muscle weakness (generalized): Secondary | ICD-10-CM | POA: Diagnosis not present

## 2012-07-08 DIAGNOSIS — H2589 Other age-related cataract: Secondary | ICD-10-CM | POA: Diagnosis not present

## 2012-07-08 DIAGNOSIS — H524 Presbyopia: Secondary | ICD-10-CM | POA: Diagnosis not present

## 2012-07-08 DIAGNOSIS — Z96659 Presence of unspecified artificial knee joint: Secondary | ICD-10-CM | POA: Diagnosis not present

## 2012-07-08 DIAGNOSIS — M1990 Unspecified osteoarthritis, unspecified site: Secondary | ICD-10-CM | POA: Diagnosis not present

## 2012-07-10 DIAGNOSIS — Z96659 Presence of unspecified artificial knee joint: Secondary | ICD-10-CM | POA: Diagnosis not present

## 2012-07-10 DIAGNOSIS — M6281 Muscle weakness (generalized): Secondary | ICD-10-CM | POA: Diagnosis not present

## 2012-07-10 DIAGNOSIS — M1990 Unspecified osteoarthritis, unspecified site: Secondary | ICD-10-CM | POA: Diagnosis not present

## 2012-07-10 DIAGNOSIS — Z09 Encounter for follow-up examination after completed treatment for conditions other than malignant neoplasm: Secondary | ICD-10-CM | POA: Diagnosis not present

## 2012-07-12 DIAGNOSIS — Z96659 Presence of unspecified artificial knee joint: Secondary | ICD-10-CM | POA: Diagnosis not present

## 2012-07-12 DIAGNOSIS — M6281 Muscle weakness (generalized): Secondary | ICD-10-CM | POA: Diagnosis not present

## 2012-07-12 DIAGNOSIS — M1990 Unspecified osteoarthritis, unspecified site: Secondary | ICD-10-CM | POA: Diagnosis not present

## 2012-07-15 DIAGNOSIS — M1990 Unspecified osteoarthritis, unspecified site: Secondary | ICD-10-CM | POA: Diagnosis not present

## 2012-07-15 DIAGNOSIS — Z96659 Presence of unspecified artificial knee joint: Secondary | ICD-10-CM | POA: Diagnosis not present

## 2012-07-15 DIAGNOSIS — M6281 Muscle weakness (generalized): Secondary | ICD-10-CM | POA: Diagnosis not present

## 2012-07-18 DIAGNOSIS — G603 Idiopathic progressive neuropathy: Secondary | ICD-10-CM | POA: Diagnosis not present

## 2012-07-18 DIAGNOSIS — G43119 Migraine with aura, intractable, without status migrainosus: Secondary | ICD-10-CM | POA: Diagnosis not present

## 2012-07-18 DIAGNOSIS — M542 Cervicalgia: Secondary | ICD-10-CM | POA: Diagnosis not present

## 2012-07-22 DIAGNOSIS — M1990 Unspecified osteoarthritis, unspecified site: Secondary | ICD-10-CM | POA: Diagnosis not present

## 2012-07-22 DIAGNOSIS — M6281 Muscle weakness (generalized): Secondary | ICD-10-CM | POA: Diagnosis not present

## 2012-07-22 DIAGNOSIS — Z96659 Presence of unspecified artificial knee joint: Secondary | ICD-10-CM | POA: Diagnosis not present

## 2012-07-24 DIAGNOSIS — Z96659 Presence of unspecified artificial knee joint: Secondary | ICD-10-CM | POA: Diagnosis not present

## 2012-07-24 DIAGNOSIS — M1990 Unspecified osteoarthritis, unspecified site: Secondary | ICD-10-CM | POA: Diagnosis not present

## 2012-07-24 DIAGNOSIS — M6281 Muscle weakness (generalized): Secondary | ICD-10-CM | POA: Diagnosis not present

## 2012-07-26 DIAGNOSIS — M1990 Unspecified osteoarthritis, unspecified site: Secondary | ICD-10-CM | POA: Diagnosis not present

## 2012-07-26 DIAGNOSIS — Z96659 Presence of unspecified artificial knee joint: Secondary | ICD-10-CM | POA: Diagnosis not present

## 2012-07-26 DIAGNOSIS — M6281 Muscle weakness (generalized): Secondary | ICD-10-CM | POA: Diagnosis not present

## 2012-07-29 DIAGNOSIS — M549 Dorsalgia, unspecified: Secondary | ICD-10-CM | POA: Diagnosis not present

## 2012-07-29 DIAGNOSIS — M6281 Muscle weakness (generalized): Secondary | ICD-10-CM | POA: Diagnosis not present

## 2012-07-29 DIAGNOSIS — M1990 Unspecified osteoarthritis, unspecified site: Secondary | ICD-10-CM | POA: Diagnosis not present

## 2012-07-29 DIAGNOSIS — Z96659 Presence of unspecified artificial knee joint: Secondary | ICD-10-CM | POA: Diagnosis not present

## 2012-07-31 DIAGNOSIS — Z96659 Presence of unspecified artificial knee joint: Secondary | ICD-10-CM | POA: Diagnosis not present

## 2012-07-31 DIAGNOSIS — M6281 Muscle weakness (generalized): Secondary | ICD-10-CM | POA: Diagnosis not present

## 2012-07-31 DIAGNOSIS — M1990 Unspecified osteoarthritis, unspecified site: Secondary | ICD-10-CM | POA: Diagnosis not present

## 2012-08-05 DIAGNOSIS — M1990 Unspecified osteoarthritis, unspecified site: Secondary | ICD-10-CM | POA: Diagnosis not present

## 2012-08-05 DIAGNOSIS — Z96659 Presence of unspecified artificial knee joint: Secondary | ICD-10-CM | POA: Diagnosis not present

## 2012-08-05 DIAGNOSIS — M6281 Muscle weakness (generalized): Secondary | ICD-10-CM | POA: Diagnosis not present

## 2012-08-06 DIAGNOSIS — G43119 Migraine with aura, intractable, without status migrainosus: Secondary | ICD-10-CM | POA: Diagnosis not present

## 2012-08-07 ENCOUNTER — Encounter: Payer: Self-pay | Admitting: Internal Medicine

## 2012-08-07 DIAGNOSIS — M1990 Unspecified osteoarthritis, unspecified site: Secondary | ICD-10-CM | POA: Diagnosis not present

## 2012-08-07 DIAGNOSIS — M6281 Muscle weakness (generalized): Secondary | ICD-10-CM | POA: Diagnosis not present

## 2012-08-07 DIAGNOSIS — Z96659 Presence of unspecified artificial knee joint: Secondary | ICD-10-CM | POA: Diagnosis not present

## 2012-08-19 ENCOUNTER — Encounter: Payer: Self-pay | Admitting: Internal Medicine

## 2012-08-20 DIAGNOSIS — IMO0002 Reserved for concepts with insufficient information to code with codable children: Secondary | ICD-10-CM | POA: Diagnosis not present

## 2012-08-24 DIAGNOSIS — M48061 Spinal stenosis, lumbar region without neurogenic claudication: Secondary | ICD-10-CM | POA: Diagnosis not present

## 2012-08-24 DIAGNOSIS — M79609 Pain in unspecified limb: Secondary | ICD-10-CM | POA: Diagnosis not present

## 2012-09-05 DIAGNOSIS — L821 Other seborrheic keratosis: Secondary | ICD-10-CM | POA: Diagnosis not present

## 2012-09-06 DIAGNOSIS — M47817 Spondylosis without myelopathy or radiculopathy, lumbosacral region: Secondary | ICD-10-CM | POA: Diagnosis not present

## 2012-09-06 DIAGNOSIS — M545 Low back pain, unspecified: Secondary | ICD-10-CM | POA: Diagnosis not present

## 2012-09-06 DIAGNOSIS — IMO0002 Reserved for concepts with insufficient information to code with codable children: Secondary | ICD-10-CM | POA: Diagnosis not present

## 2012-09-06 DIAGNOSIS — M5137 Other intervertebral disc degeneration, lumbosacral region: Secondary | ICD-10-CM | POA: Diagnosis not present

## 2012-09-09 DIAGNOSIS — IMO0002 Reserved for concepts with insufficient information to code with codable children: Secondary | ICD-10-CM | POA: Diagnosis not present

## 2012-09-27 DIAGNOSIS — M545 Low back pain, unspecified: Secondary | ICD-10-CM | POA: Diagnosis not present

## 2012-09-27 DIAGNOSIS — M5137 Other intervertebral disc degeneration, lumbosacral region: Secondary | ICD-10-CM | POA: Diagnosis not present

## 2012-09-27 DIAGNOSIS — IMO0002 Reserved for concepts with insufficient information to code with codable children: Secondary | ICD-10-CM | POA: Diagnosis not present

## 2012-10-04 ENCOUNTER — Ambulatory Visit (AMBULATORY_SURGERY_CENTER): Payer: Medicare Other | Admitting: *Deleted

## 2012-10-04 VITALS — Ht 66.0 in | Wt 176.8 lb

## 2012-10-04 DIAGNOSIS — Z86010 Personal history of colonic polyps: Secondary | ICD-10-CM

## 2012-10-04 MED ORDER — MOVIPREP 100 G PO SOLR: 1.0000 | Freq: Once | ORAL | Status: DC

## 2012-10-04 NOTE — Progress Notes (Signed)
Denies allergies to eggs or soy products. Denies complications of anesthesia or sedation.

## 2012-10-11 DIAGNOSIS — G43119 Migraine with aura, intractable, without status migrainosus: Secondary | ICD-10-CM | POA: Diagnosis not present

## 2012-10-11 DIAGNOSIS — M359 Systemic involvement of connective tissue, unspecified: Secondary | ICD-10-CM | POA: Diagnosis not present

## 2012-10-15 DIAGNOSIS — R609 Edema, unspecified: Secondary | ICD-10-CM | POA: Diagnosis not present

## 2012-10-15 DIAGNOSIS — I8310 Varicose veins of unspecified lower extremity with inflammation: Secondary | ICD-10-CM | POA: Diagnosis not present

## 2012-10-16 DIAGNOSIS — R609 Edema, unspecified: Secondary | ICD-10-CM | POA: Diagnosis not present

## 2012-10-18 ENCOUNTER — Encounter: Payer: Self-pay | Admitting: Internal Medicine

## 2012-10-18 ENCOUNTER — Ambulatory Visit (AMBULATORY_SURGERY_CENTER): Payer: Medicare Other | Admitting: Internal Medicine

## 2012-10-18 VITALS — BP 111/64 | HR 64 | Temp 97.4°F | Resp 12 | Ht 66.0 in | Wt 176.0 lb

## 2012-10-18 DIAGNOSIS — D126 Benign neoplasm of colon, unspecified: Secondary | ICD-10-CM

## 2012-10-18 DIAGNOSIS — Z86010 Personal history of colonic polyps: Secondary | ICD-10-CM

## 2012-10-18 DIAGNOSIS — E039 Hypothyroidism, unspecified: Secondary | ICD-10-CM | POA: Diagnosis not present

## 2012-10-18 DIAGNOSIS — I10 Essential (primary) hypertension: Secondary | ICD-10-CM | POA: Diagnosis not present

## 2012-10-18 DIAGNOSIS — K5730 Diverticulosis of large intestine without perforation or abscess without bleeding: Secondary | ICD-10-CM | POA: Diagnosis not present

## 2012-10-18 MED ORDER — SODIUM CHLORIDE 0.9 % IV SOLN: 500.0000 mL | INTRAVENOUS | Status: DC

## 2012-10-18 NOTE — Progress Notes (Signed)
Patient did not experience any of the following events: a burn prior to discharge; a fall within the facility; wrong site/side/patient/procedure/implant event; or a hospital transfer or hospital admission upon discharge from the facility. (G8907) Patient did not have preoperative order for IV antibiotic SSI prophylaxis. (G8918)  

## 2012-10-18 NOTE — Patient Instructions (Addendum)
YOU HAD AN ENDOSCOPIC PROCEDURE TODAY AT THE [facility]: Refer to the procedure report that was given to you for any specific questions about what was found during the examination.  If the procedure report does not answer your questions, please call your gastroenterologist to clarify.  If you requested that your care partner not be given the details of your procedure findings, then the procedure report has been included in a sealed envelope for you to review at your convenience later.  YOU SHOULD EXPECT: Some feelings of bloating in the abdomen. Passage of more gas than usual.  Walking can help get rid of the air that was put into your GI tract during the procedure and reduce the bloating. If you had a lower endoscopy (such as a colonoscopy or flexible sigmoidoscopy) you may notice spotting of blood in your stool or on the toilet paper. If you underwent a bowel prep for your procedure, then you may not have a normal bowel movement for a few days.  DIET: Your first meal following the procedure should be a light meal and then it is ok to progress to your normal diet.  A half-sandwich or bowl of soup is an example of a good first meal.  Heavy or fried foods are harder to digest and may make you feel nauseous or bloated.  Likewise meals heavy in dairy and vegetables can cause extra gas to form and this can also increase the bloating.  Drink plenty of fluids but you should avoid alcoholic beverages for 24 hours.  ACTIVITY: Your care partner should take you home directly after the procedure.  You should plan to take it easy, moving slowly for the rest of the day.  You can resume normal activity the day after the procedure however you should NOT DRIVE or use heavy machinery for 24 hours (because of the sedation medicines used during the test).    SYMPTOMS TO REPORT IMMEDIATELY: A gastroenterologist can be reached at any hour.  During normal business hours, 8:30 AM to 5:00 PM Monday through Friday,  call [phone].  After hours and on weekends, please call the GI answering service at [phone] who will take a message and have the physician on call contact you.   Following lower endoscopy (colonoscopy or flexible sigmoidoscopy):  Excessive amounts of blood in the stool  Significant tenderness or worsening of abdominal pains  Swelling of the abdomen that is new, acute  Fever of 100F or higher  FOLLOW UP: If any biopsies were taken you will be contacted by phone or by letter within the next 1-3 weeks.  Call your gastroenterologist if you have not heard about the biopsies in 3 weeks.  Our staff will call the home number listed on your records the next business day following your procedure to check on you and address any questions or concerns that you may have at that time regarding the information given to you following your procedure. This is a courtesy call and so if there is no answer at the home number and we have not heard from you through the emergency physician on call, we will assume that you have returned to your regular daily activities without incident.  SIGNATURES/CONFIDENTIALITY: You and/or your care partner have signed paperwork which will be entered into your electronic medical record.  These signatures attest to the fact that that the information above on your After Visit Summary has been reviewed and is understood.  Full responsibility of the confidentiality of this   discharge information lies with you and/or your care-partner.  Polyps, Diverticulosis, high fiber diet-handouts given  Repeat colonoscopy will be determined by pathology.  

## 2012-10-18 NOTE — Progress Notes (Signed)
Report to pacu rn, vss,bbs=clear,

## 2012-10-18 NOTE — Op Note (Signed)
[facility] [address] [place], [address]   COLONOSCOPY PROCEDURE REPORT  PATIENT: Torina, Ey  MR#: [id number] BIRTHDATE: 02/24/1941 , 72  yrs. old GENDER: Female ENDOSCOPIST: Hart Carwin, MD REFERRED BY: Dr Juleen China PROCEDURE DATE:  10/18/2012 PROCEDURE:   Colonoscopy with cold biopsy polypectomy First Screening Colonoscopy - Avg.  risk and is 50 yrs.  old or older - No.  Prior Negative Screening - Now for repeat screening. N/A  History of Adenoma - Now for follow-up colonoscopy & has been > or = to 3 yrs.  Yes hx of adenoma.  Has been 3 or more years since last colonoscopy.  Polyps Removed Today? Yes. ASA CLASS:   Class II INDICATIONS:Patient's personal history of adenomatous colon polyps and adenomatous polyp in 2001, 2005,08/2007. MEDICATIONS: MAC sedation, administered by CRNA and propofol (Diprivan) 300mg  IV  DESCRIPTION OF PROCEDURE:   After the risks benefits and alternatives of the procedure were thoroughly explained, informed consent was obtained.  A digital rectal exam revealed no abnormalities of the rectum.   The LB [id number]-[id number]  endoscope was introduced through the anus and advanced to the cecum, which was identified by both the appendix and ileocecal valve. No adverse events experienced.   The quality of the prep was excellent, using MoviPrep  The instrument was then slowly withdrawn as the colon was fully examined.      COLON FINDINGS: Two smooth sessile polyps ranging between 3-29mm in size were found in the ascending colon.and in the rectum at 5 cm. A polypectomy was performed with cold forceps.  The resection was complete and the polyp tissue was completely retrieved.There was mild diverticulosis of the sigmoid colon.  Retroflexed views revealed no abnormalities. The time to cecum=11 minutes 3 seconds. Withdrawal time=8 minutes 33 seconds.  The scope was withdrawn and the procedure completed. COMPLICATIONS: There were  no complications.  ENDOSCOPIC IMPRESSION: Two sessile polyps ranging between 3-84mm in size were found in the ascending colon; polypectomy was performed with cold forceps mild diverticulosis of the sigmoid colon  RECOMMENDATIONS: 1.  Await pathology results 2.  high fiber diet 3.  recall colonoscopy pending path report   eSigned:  Hart Carwin, MD 10/18/2012 10:49 AM   cc:   PATIENT NAME:  Pamalee, Marcoe MR#: [id number]

## 2012-10-18 NOTE — Progress Notes (Signed)
Called to room to assist during endoscopic procedure.  Patient ID and intended procedure confirmed with present staff. Received instructions for my participation in the procedure from the performing physician.  

## 2012-10-21 ENCOUNTER — Telehealth: Payer: Self-pay | Admitting: *Deleted

## 2012-10-21 NOTE — Telephone Encounter (Signed)
  Follow up Call-  Call back number 10/18/2012  Post procedure Call Back phone  # [phone]  Permission to leave phone message Yes     Patient questions:  Do you have a fever, pain , or abdominal swelling? no Pain Score  0 *  Have you tolerated food without any problems? yes  Have you been able to return to your normal activities? yes  Do you have any questions about your discharge instructions: Diet   no Medications  no Follow up visit  no  Do you have questions or concerns about your Care? no  Actions: * If pain score is 4 or above: No action needed, pain <4.

## 2012-10-23 ENCOUNTER — Encounter: Payer: Self-pay | Admitting: Internal Medicine

## 2012-11-05 DIAGNOSIS — I8310 Varicose veins of unspecified lower extremity with inflammation: Secondary | ICD-10-CM | POA: Diagnosis not present

## 2012-11-05 DIAGNOSIS — R609 Edema, unspecified: Secondary | ICD-10-CM | POA: Diagnosis not present

## 2012-11-07 DIAGNOSIS — M7989 Other specified soft tissue disorders: Secondary | ICD-10-CM | POA: Diagnosis not present

## 2012-11-07 DIAGNOSIS — I10 Essential (primary) hypertension: Secondary | ICD-10-CM | POA: Diagnosis not present

## 2012-11-07 DIAGNOSIS — E782 Mixed hyperlipidemia: Secondary | ICD-10-CM | POA: Diagnosis not present

## 2012-11-08 DIAGNOSIS — M25569 Pain in unspecified knee: Secondary | ICD-10-CM | POA: Diagnosis not present

## 2012-12-09 DIAGNOSIS — I10 Essential (primary) hypertension: Secondary | ICD-10-CM | POA: Diagnosis not present

## 2013-01-07 DIAGNOSIS — Z23 Encounter for immunization: Secondary | ICD-10-CM | POA: Diagnosis not present

## 2013-01-10 DIAGNOSIS — H2589 Other age-related cataract: Secondary | ICD-10-CM | POA: Diagnosis not present

## 2013-01-15 ENCOUNTER — Encounter: Payer: Self-pay | Admitting: *Deleted

## 2013-01-16 ENCOUNTER — Encounter: Payer: Self-pay | Admitting: Internal Medicine

## 2013-01-20 ENCOUNTER — Ambulatory Visit (INDEPENDENT_AMBULATORY_CARE_PROVIDER_SITE_OTHER): Payer: Medicare Other | Admitting: Internal Medicine

## 2013-01-20 ENCOUNTER — Encounter: Payer: Self-pay | Admitting: Internal Medicine

## 2013-01-20 VITALS — BP 122/82 | HR 64 | Ht 66.0 in | Wt 172.5 lb

## 2013-01-20 DIAGNOSIS — E785 Hyperlipidemia, unspecified: Secondary | ICD-10-CM

## 2013-01-20 DIAGNOSIS — I10 Essential (primary) hypertension: Secondary | ICD-10-CM

## 2013-01-20 DIAGNOSIS — M129 Arthropathy, unspecified: Secondary | ICD-10-CM

## 2013-01-20 NOTE — Patient Instructions (Signed)
Please have fasting blood work at your earliest convenience using a [facility] lab.  Your physician wants you to follow-up in: 1 year. You will receive a reminder letter in the mail two months in advance. If you don't receive a letter, please call our office to schedule the follow-up appointment.

## 2013-01-20 NOTE — Progress Notes (Signed)
OFFICE NOTE  Chief Complaint:  No complaints other than arthritis  Primary Care Physician: Marylen Ponto, MD  HPI:  Bridget [name]  is a 72 year old female I have been seeing annually with a history of fatigue in the past. Actually when I saw her the last time she was short of breath only walking across the kitchen. We did undergo a significant workup which was basically normal. She returns today and says those symptoms have completely resolved and is unclear what they were due to. Unfortunately in February as you know she had a left hip replacement and is due for right knee replacement sometime in the near future. Denies any chest pain, worsening shortness of breath, palpitations, presyncope or syncopal symptoms.  Her main complaint is arthritis which is now developing in her left hip and knee. She's been hesitant to take meloxicam due to concerns about worsening heart disease. She reported she had cholesterol testing several months ago and is due for this again. The only other change is that she was noted to be taken off of amlodipine and switched to benazepril. She's not sure that that works as well for her blood pressure, but it does appear well-controlled today 122/82.  This was due to lower extremity swelling.  PMHx:  Past Medical History  Diagnosis Date  . Seasonal allergies   . Arthritis     hip OA  . 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 GERD (gastroesophageal reflux disease)   . Hyperlipidemia   . Hypertension   . Neuropathy   . Hypothyroidism   . Degenerative disc disease   . Migraines     Past Surgical History  Procedure Laterality Date  . 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 Total hip arthroplasty Left 04/2011  . Total abdominal hysterectomy  1978  . Cervical discectomy  2005    with fusion  . Cholecystectomy  2006  . Transthoracic echocardiogram  12/2008    EF=>55%, borderline conc LVH; trace MR; mod TR; trace AV regurg    FAMHx:  Family History    Problem Relation Age of Onset  . Rectal cancer Neg Hx   . Stomach cancer Neg Hx   . Colon cancer Cousin   . Esophageal cancer Cousin   . Stroke Mother 67  . Coronary artery disease Father 39  . Stroke Maternal Grandfather   . Stroke Paternal Grandmother   . Breast cancer Paternal Grandmother     SOCHx:   reports that she has never smoked. She has never used smokeless tobacco. She reports that she does not drink alcohol or use illicit drugs.  ALLERGIES:  Allergies  Allergen Reactions  . Codeine   . Sulfonamide Derivatives     ROS: A comprehensive review of systems was negative except for: Musculoskeletal: positive for arthralgias and stiff joints  HOME MEDS: Current Outpatient Prescriptions  Medication Sig Dispense Refill  . aspirin 81 MG tablet Take 81 mg by mouth daily.      . benazepril (LOTENSIN) 10 MG tablet Take 1 tablet by mouth daily.      . Calcium Carbonate-Vitamin D (CALCIUM + D PO) Take 1,200 mg by mouth daily.      . cholecalciferol (VITAMIN D) 1000 UNITS tablet Take 1,000 Units by mouth daily.      . Coenzyme Q10 (CO Q-10) 100 MG CAPS Take 1 capsule by mouth daily.      Marland Kitchen estradiol (CLIMARA - DOSED IN MG/24 HR) 0.025 mg/24hr       .  ezetimibe (ZETIA) 10 MG tablet Take 10 mg by mouth daily.      Marland Kitchen gabapentin (NEURONTIN) 600 MG tablet Take 600 mg by mouth 3 (three) times daily.      Marland Kitchen levothyroxine (SYNTHROID, LEVOTHROID) 75 MCG tablet Take 75 mcg by mouth daily before breakfast.      . metoprolol succinate (TOPROL-XL) 50 MG 24 hr tablet Take 50 mg by mouth daily. Take with or immediately following a meal.      . Multiple Vitamin (MULTIVITAMIN) capsule Take 1 capsule by mouth daily.      Marland Kitchen omeprazole (PRILOSEC) 20 MG capsule Take 20 mg by mouth as needed.       . rosuvastatin (CRESTOR) 10 MG tablet Take 10 mg by mouth daily.       No current facility-administered medications for this visit.    LABS/IMAGING: No results found for this or any previous visit  (from the past 48 hour(s)). No results found.  VITALS: BP 122/82  Pulse 64  Ht 5\' 6"  (1.676 m)  Wt 172 lb 8 oz (78.245 kg)  BMI 27.86 kg/m2  EXAM: General appearance: alert and no distress Neck: no carotid bruit and no JVD Lungs: clear to auscultation bilaterally Heart: regular rate and rhythm, S1, S2 normal, no murmur, click, rub or gallop Abdomen: soft, non-tender; bowel sounds normal; no masses,  no organomegaly Extremities: extremities normal, atraumatic, no cyanosis or edema Pulses: 2+ and symmetric Skin: Skin color, texture, turgor normal. No rashes or lesions Neurologic: Grossly normal Psych: Mood, affect normal  EKG: Normal sinus rhythm at 64  ASSESSMENT: 1. Hypertension-well controlled 2. Dyslipidemia-on Crestor and Zetia 3. Arthritis  PLAN: 1.   Ms. Marulanda is doing very well from a cardiac standpoint. Her blood pressure is well controlled despite recent change in her medications. She is maintained on Crestor and Zetia and will later recheck of her lipid profile to make sure that she is at goal. She asked about using meloxicam for her arthritis, and I reassured her that the risk is very low for her and she should use it if she has pain. She also cord about using calcium supplements, and I recommended she discuss this with her primary, although there is less data for the use of routine calcium supplementation and people without known osteopenia or osteoporosis. Plan to see her back annually and will contact her with results of her lipid profile and make adjustments as necessary.  Chrystie Nose, MD, [facility] Attending Cardiologist [facility]  Clorene Nerio C 01/20/2013, 11:00 AM

## 2013-02-04 DIAGNOSIS — H18599 Other hereditary corneal dystrophies, unspecified eye: Secondary | ICD-10-CM | POA: Diagnosis not present

## 2013-02-07 DIAGNOSIS — R351 Nocturia: Secondary | ICD-10-CM | POA: Diagnosis not present

## 2013-02-07 DIAGNOSIS — E785 Hyperlipidemia, unspecified: Secondary | ICD-10-CM | POA: Diagnosis not present

## 2013-02-07 DIAGNOSIS — N3010 Interstitial cystitis (chronic) without hematuria: Secondary | ICD-10-CM | POA: Diagnosis not present

## 2013-02-07 DIAGNOSIS — N949 Unspecified condition associated with female genital organs and menstrual cycle: Secondary | ICD-10-CM | POA: Diagnosis not present

## 2013-02-10 LAB — NMR LIPOPROFILE WITH LIPIDS
Cholesterol, Total: 174 mg/dL (ref <200)
HDL Size: 9.6 nm (ref >=9.2)
HDL-C: 64 mg/dL (ref >=40)
LDL (calc): 89 mg/dL (ref <100)
LDL Particle Number: 1298 nmol/L — ABNORMAL HIGH (ref <1000)
LDL Size: 20.4 nm — ABNORMAL LOW (ref >20.5)
LP-IR Score: 40 (ref <=45)
Large VLDL-P: 2.6 nmol/L (ref <=2.7)
Small LDL Particle Number: 632 nmol/L — ABNORMAL HIGH (ref <=527)
VLDL Size: 47.4 nm — ABNORMAL HIGH (ref <=46.6)

## 2013-02-11 ENCOUNTER — Encounter: Payer: Self-pay | Admitting: *Deleted

## 2013-02-25 DIAGNOSIS — Z79899 Other long term (current) drug therapy: Secondary | ICD-10-CM | POA: Diagnosis not present

## 2013-02-25 DIAGNOSIS — E039 Hypothyroidism, unspecified: Secondary | ICD-10-CM | POA: Diagnosis not present

## 2013-02-25 DIAGNOSIS — I10 Essential (primary) hypertension: Secondary | ICD-10-CM | POA: Diagnosis not present

## 2013-02-25 DIAGNOSIS — H18599 Other hereditary corneal dystrophies, unspecified eye: Secondary | ICD-10-CM | POA: Diagnosis not present

## 2013-02-27 DIAGNOSIS — Z1231 Encounter for screening mammogram for malignant neoplasm of breast: Secondary | ICD-10-CM | POA: Diagnosis not present

## 2013-03-10 DIAGNOSIS — L570 Actinic keratosis: Secondary | ICD-10-CM | POA: Diagnosis not present

## 2013-04-01 DIAGNOSIS — E039 Hypothyroidism, unspecified: Secondary | ICD-10-CM | POA: Diagnosis not present

## 2013-04-01 DIAGNOSIS — H18509 Unspecified hereditary corneal dystrophies, unspecified eye: Secondary | ICD-10-CM | POA: Diagnosis not present

## 2013-04-01 DIAGNOSIS — K219 Gastro-esophageal reflux disease without esophagitis: Secondary | ICD-10-CM | POA: Diagnosis not present

## 2013-04-01 DIAGNOSIS — H18599 Other hereditary corneal dystrophies, unspecified eye: Secondary | ICD-10-CM | POA: Diagnosis not present

## 2013-04-01 DIAGNOSIS — I10 Essential (primary) hypertension: Secondary | ICD-10-CM | POA: Diagnosis not present

## 2013-04-01 DIAGNOSIS — G609 Hereditary and idiopathic neuropathy, unspecified: Secondary | ICD-10-CM | POA: Diagnosis not present

## 2013-04-01 DIAGNOSIS — Z79899 Other long term (current) drug therapy: Secondary | ICD-10-CM | POA: Diagnosis not present

## 2013-04-11 DIAGNOSIS — G43119 Migraine with aura, intractable, without status migrainosus: Secondary | ICD-10-CM | POA: Diagnosis not present

## 2013-04-11 DIAGNOSIS — G603 Idiopathic progressive neuropathy: Secondary | ICD-10-CM | POA: Diagnosis not present

## 2013-04-22 DIAGNOSIS — Z124 Encounter for screening for malignant neoplasm of cervix: Secondary | ICD-10-CM | POA: Diagnosis not present

## 2013-04-23 DIAGNOSIS — M25569 Pain in unspecified knee: Secondary | ICD-10-CM | POA: Diagnosis not present

## 2013-05-21 DIAGNOSIS — N3010 Interstitial cystitis (chronic) without hematuria: Secondary | ICD-10-CM | POA: Diagnosis not present

## 2013-05-22 DIAGNOSIS — L821 Other seborrheic keratosis: Secondary | ICD-10-CM | POA: Diagnosis not present

## 2013-05-22 DIAGNOSIS — L578 Other skin changes due to chronic exposure to nonionizing radiation: Secondary | ICD-10-CM | POA: Diagnosis not present

## 2013-05-22 DIAGNOSIS — L570 Actinic keratosis: Secondary | ICD-10-CM | POA: Diagnosis not present

## 2013-05-28 DIAGNOSIS — E039 Hypothyroidism, unspecified: Secondary | ICD-10-CM | POA: Diagnosis not present

## 2013-05-28 DIAGNOSIS — G608 Other hereditary and idiopathic neuropathies: Secondary | ICD-10-CM | POA: Diagnosis not present

## 2013-05-28 DIAGNOSIS — I10 Essential (primary) hypertension: Secondary | ICD-10-CM | POA: Diagnosis not present

## 2013-05-28 DIAGNOSIS — E782 Mixed hyperlipidemia: Secondary | ICD-10-CM | POA: Diagnosis not present

## 2013-07-10 DIAGNOSIS — G8929 Other chronic pain: Secondary | ICD-10-CM

## 2013-07-10 DIAGNOSIS — M359 Systemic involvement of connective tissue, unspecified: Secondary | ICD-10-CM | POA: Diagnosis not present

## 2013-07-10 DIAGNOSIS — M542 Cervicalgia: Secondary | ICD-10-CM | POA: Insufficient documentation

## 2013-07-10 DIAGNOSIS — G43119 Migraine with aura, intractable, without status migrainosus: Secondary | ICD-10-CM | POA: Diagnosis not present

## 2013-07-10 DIAGNOSIS — I671 Cerebral aneurysm, nonruptured: Secondary | ICD-10-CM | POA: Diagnosis not present

## 2013-07-10 HISTORY — DX: Other chronic pain: G89.29

## 2013-07-21 DIAGNOSIS — G43119 Migraine with aura, intractable, without status migrainosus: Secondary | ICD-10-CM | POA: Diagnosis not present

## 2013-07-21 DIAGNOSIS — G43909 Migraine, unspecified, not intractable, without status migrainosus: Secondary | ICD-10-CM | POA: Diagnosis not present

## 2013-07-21 DIAGNOSIS — R930 Abnormal findings on diagnostic imaging of skull and head, not elsewhere classified: Secondary | ICD-10-CM | POA: Diagnosis not present

## 2013-07-21 DIAGNOSIS — G589 Mononeuropathy, unspecified: Secondary | ICD-10-CM | POA: Diagnosis not present

## 2013-07-28 DIAGNOSIS — G609 Hereditary and idiopathic neuropathy, unspecified: Secondary | ICD-10-CM | POA: Diagnosis not present

## 2013-07-28 DIAGNOSIS — G43909 Migraine, unspecified, not intractable, without status migrainosus: Secondary | ICD-10-CM | POA: Diagnosis not present

## 2013-07-30 DIAGNOSIS — G709 Myoneural disorder, unspecified: Secondary | ICD-10-CM | POA: Insufficient documentation

## 2013-07-30 DIAGNOSIS — G43909 Migraine, unspecified, not intractable, without status migrainosus: Secondary | ICD-10-CM | POA: Insufficient documentation

## 2013-07-30 DIAGNOSIS — G629 Polyneuropathy, unspecified: Secondary | ICD-10-CM | POA: Insufficient documentation

## 2013-08-05 DIAGNOSIS — H2589 Other age-related cataract: Secondary | ICD-10-CM | POA: Diagnosis not present

## 2013-08-13 DIAGNOSIS — H2589 Other age-related cataract: Secondary | ICD-10-CM | POA: Diagnosis not present

## 2013-08-13 DIAGNOSIS — H2510 Age-related nuclear cataract, unspecified eye: Secondary | ICD-10-CM | POA: Diagnosis not present

## 2013-10-27 DIAGNOSIS — G43909 Migraine, unspecified, not intractable, without status migrainosus: Secondary | ICD-10-CM | POA: Diagnosis not present

## 2013-10-27 DIAGNOSIS — M47812 Spondylosis without myelopathy or radiculopathy, cervical region: Secondary | ICD-10-CM | POA: Diagnosis not present

## 2013-10-27 DIAGNOSIS — G609 Hereditary and idiopathic neuropathy, unspecified: Secondary | ICD-10-CM | POA: Diagnosis not present

## 2013-11-04 DIAGNOSIS — H04129 Dry eye syndrome of unspecified lacrimal gland: Secondary | ICD-10-CM | POA: Diagnosis not present

## 2013-11-06 DIAGNOSIS — M542 Cervicalgia: Secondary | ICD-10-CM | POA: Diagnosis not present

## 2013-11-06 DIAGNOSIS — Z981 Arthrodesis status: Secondary | ICD-10-CM | POA: Diagnosis not present

## 2013-11-06 DIAGNOSIS — M47812 Spondylosis without myelopathy or radiculopathy, cervical region: Secondary | ICD-10-CM | POA: Diagnosis not present

## 2013-11-06 DIAGNOSIS — M5090 Cervical disc disorder, unspecified, unspecified cervical region: Secondary | ICD-10-CM | POA: Diagnosis not present

## 2013-11-12 DIAGNOSIS — H52209 Unspecified astigmatism, unspecified eye: Secondary | ICD-10-CM | POA: Diagnosis not present

## 2013-11-12 DIAGNOSIS — H2510 Age-related nuclear cataract, unspecified eye: Secondary | ICD-10-CM | POA: Diagnosis not present

## 2013-11-12 DIAGNOSIS — H2589 Other age-related cataract: Secondary | ICD-10-CM | POA: Diagnosis not present

## 2013-12-23 DIAGNOSIS — Z23 Encounter for immunization: Secondary | ICD-10-CM | POA: Diagnosis not present

## 2014-01-26 DIAGNOSIS — G629 Polyneuropathy, unspecified: Secondary | ICD-10-CM | POA: Diagnosis not present

## 2014-01-26 DIAGNOSIS — G43709 Chronic migraine without aura, not intractable, without status migrainosus: Secondary | ICD-10-CM | POA: Diagnosis not present

## 2014-01-27 DIAGNOSIS — Z23 Encounter for immunization: Secondary | ICD-10-CM | POA: Diagnosis not present

## 2014-02-20 ENCOUNTER — Ambulatory Visit: Payer: Medicare Other | Admitting: Internal Medicine

## 2014-03-03 DIAGNOSIS — Z1231 Encounter for screening mammogram for malignant neoplasm of breast: Secondary | ICD-10-CM | POA: Diagnosis not present

## 2014-03-24 DIAGNOSIS — G43839 Menstrual migraine, intractable, without status migrainosus: Secondary | ICD-10-CM | POA: Diagnosis not present

## 2014-03-24 DIAGNOSIS — Z049 Encounter for examination and observation for unspecified reason: Secondary | ICD-10-CM | POA: Diagnosis not present

## 2014-03-24 DIAGNOSIS — G43111 Migraine with aura, intractable, with status migrainosus: Secondary | ICD-10-CM | POA: Diagnosis not present

## 2014-03-24 DIAGNOSIS — Z79899 Other long term (current) drug therapy: Secondary | ICD-10-CM | POA: Diagnosis not present

## 2014-03-24 DIAGNOSIS — R51 Headache: Secondary | ICD-10-CM | POA: Diagnosis not present

## 2014-03-24 DIAGNOSIS — G43719 Chronic migraine without aura, intractable, without status migrainosus: Secondary | ICD-10-CM | POA: Diagnosis not present

## 2014-03-26 DIAGNOSIS — G43111 Migraine with aura, intractable, with status migrainosus: Secondary | ICD-10-CM | POA: Diagnosis not present

## 2014-03-26 DIAGNOSIS — G43839 Menstrual migraine, intractable, without status migrainosus: Secondary | ICD-10-CM | POA: Diagnosis not present

## 2014-03-26 DIAGNOSIS — G518 Other disorders of facial nerve: Secondary | ICD-10-CM | POA: Diagnosis not present

## 2014-03-26 DIAGNOSIS — R51 Headache: Secondary | ICD-10-CM | POA: Diagnosis not present

## 2014-03-26 DIAGNOSIS — G43719 Chronic migraine without aura, intractable, without status migrainosus: Secondary | ICD-10-CM | POA: Diagnosis not present

## 2014-03-26 DIAGNOSIS — M542 Cervicalgia: Secondary | ICD-10-CM | POA: Diagnosis not present

## 2014-03-26 DIAGNOSIS — M791 Myalgia: Secondary | ICD-10-CM | POA: Diagnosis not present

## 2014-04-09 DIAGNOSIS — G43839 Menstrual migraine, intractable, without status migrainosus: Secondary | ICD-10-CM | POA: Diagnosis not present

## 2014-04-09 DIAGNOSIS — G43111 Migraine with aura, intractable, with status migrainosus: Secondary | ICD-10-CM | POA: Diagnosis not present

## 2014-04-09 DIAGNOSIS — R51 Headache: Secondary | ICD-10-CM | POA: Diagnosis not present

## 2014-04-09 DIAGNOSIS — M791 Myalgia: Secondary | ICD-10-CM | POA: Diagnosis not present

## 2014-04-09 DIAGNOSIS — G518 Other disorders of facial nerve: Secondary | ICD-10-CM | POA: Diagnosis not present

## 2014-04-09 DIAGNOSIS — G43719 Chronic migraine without aura, intractable, without status migrainosus: Secondary | ICD-10-CM | POA: Diagnosis not present

## 2014-04-09 DIAGNOSIS — M542 Cervicalgia: Secondary | ICD-10-CM | POA: Diagnosis not present

## 2014-04-14 ENCOUNTER — Encounter: Payer: Self-pay | Admitting: Internal Medicine

## 2014-04-14 ENCOUNTER — Ambulatory Visit (INDEPENDENT_AMBULATORY_CARE_PROVIDER_SITE_OTHER): Payer: Medicare Other | Admitting: Internal Medicine

## 2014-04-14 VITALS — BP 108/74 | HR 68 | Ht 66.5 in | Wt 163.7 lb

## 2014-04-14 DIAGNOSIS — I10 Essential (primary) hypertension: Secondary | ICD-10-CM

## 2014-04-14 DIAGNOSIS — R51 Headache: Secondary | ICD-10-CM

## 2014-04-14 DIAGNOSIS — G8929 Other chronic pain: Secondary | ICD-10-CM

## 2014-04-14 DIAGNOSIS — E785 Hyperlipidemia, unspecified: Secondary | ICD-10-CM

## 2014-04-14 DIAGNOSIS — R519 Other chronic pain: Secondary | ICD-10-CM

## 2014-04-14 MED ORDER — ROSUVASTATIN CALCIUM 10 MG PO TABS: 10.0000 mg | ORAL_TABLET | Freq: Every day | ORAL | Status: DC

## 2014-04-14 MED ORDER — EZETIMIBE 10 MG PO TABS: 10.0000 mg | ORAL_TABLET | Freq: Every day | ORAL | Status: DC

## 2014-04-14 NOTE — Progress Notes (Signed)
OFFICE NOTE  Chief Complaint:  No complaints other than headaches  Primary Care Physician: Ronita Hipps, MD  HPI:  Bridget [name]  is a 74 year old female I have been seeing annually with a history of fatigue in the past. Actually when I saw her the last time she was short of breath only walking across the kitchen. We did undergo a significant workup which was basically normal. She returns today and says those symptoms have completely resolved and is unclear what they were due to. Unfortunately in February as you know she had a left hip replacement and is due for right knee replacement sometime in the near future. Denies any chest pain, worsening shortness of breath, palpitations, presyncope or syncopal symptoms.  Her main complaint is arthritis which is now developing in her left hip and knee. She's been hesitant to take meloxicam due to concerns about worsening heart disease. She reported she had cholesterol testing several months ago and is due for this again. The only other change is that she was noted to be taken off of amlodipine and switched to benazepril. She's not sure that that works as well for her blood pressure, but it does appear well-controlled today 122/82.  This was due to lower extremity swelling.  I saw Bridget [name] back in the office today. She's been having some problems with recurrent headaches. She's currently go to headache clinic and has been weaned off of all NSAIDs. She denies any chest pain or worsening shortness of breath. Her blood pressure control is been excellent. She's not had reassessment of her cholesterol to my knowledge since her last study in 2014.  PMHx:  Past Medical History  Diagnosis Date  . Seasonal allergies   . Arthritis     hip OA  . 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 GERD (gastroesophageal reflux disease)   . Hyperlipidemia   . Hypertension   . Neuropathy   . Hypothyroidism   . Degenerative disc disease   . Migraines      Past Surgical History  Procedure Laterality Date  . 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 Total hip arthroplasty Left 04/2011  . Total abdominal hysterectomy  1978  . Cervical discectomy  2005    with fusion  . Cholecystectomy  2006  . Transthoracic echocardiogram  12/2008    EF=>55%, borderline conc LVH; trace MR; mod TR; trace AV regurg    FAMHx:  Family History  Problem Relation Age of Onset  . Rectal cancer Neg Hx   . Stomach cancer Neg Hx   . Colon cancer Cousin   . Esophageal cancer Cousin   . Stroke Mother 65  . Coronary artery disease Father 25  . Stroke Maternal Grandfather   . Stroke Paternal Grandmother   . Breast cancer Paternal Grandmother     SOCHx:   reports that she has never smoked. She has never used smokeless tobacco. She reports that she does not drink alcohol or use illicit drugs.  ALLERGIES:  Allergies  Allergen Reactions  . Amlodipine Other (See Comments)    swelling  . Codeine   . Sulfonamide Derivatives     ROS: A comprehensive review of systems was negative except for: Neurological: positive for headaches  HOME MEDS: Current Outpatient Prescriptions  Medication Sig Dispense Refill  . aspirin 81 MG tablet Take 81 mg by mouth daily.    . baclofen (LIORESAL) 10 MG tablet Take 10 mg by mouth 2 (two) times  daily as needed.  0  . Calcium Carbonate-Vitamin D (CALCIUM + D PO) Take 1,200 mg by mouth daily.    . cholecalciferol (VITAMIN D) 1000 UNITS tablet Take 1,000 Units by mouth daily.    . Coenzyme Q10 (CO Q-10) 100 MG CAPS Take 1 capsule by mouth daily.    Marland Kitchen estradiol (CLIMARA - DOSED IN MG/24 HR) 0.025 mg/24hr     . ezetimibe (ZETIA) 10 MG tablet Take 1 tablet (10 mg total) by mouth daily. 28 tablet 0  . gabapentin (NEURONTIN) 600 MG tablet Take 600 mg by mouth 3 (three) times daily.    Marland Kitchen imipramine (TOFRANIL) 25 MG tablet Take 25 mg by mouth at bedtime.  1  . irbesartan (AVAPRO) 75 MG tablet Take 75 mg by mouth daily.    Marland Kitchen  levothyroxine (SYNTHROID, LEVOTHROID) 75 MCG tablet Take 75 mcg by mouth daily before breakfast.    . metoprolol succinate (TOPROL-XL) 50 MG 24 hr tablet Take 50 mg by mouth daily. Take with or immediately following a meal.    . Multiple Vitamin (MULTIVITAMIN) capsule Take 1 capsule by mouth daily.    . rosuvastatin (CRESTOR) 10 MG tablet Take 1 tablet (10 mg total) by mouth daily. 28 tablet 0   No current facility-administered medications for this visit.    LABS/IMAGING: No results found for this or any previous visit (from the past 48 hour(s)). No results found.  VITALS: BP 108/74 mmHg  Pulse 68  Ht 5' 6.5" (1.689 m)  Wt 163 lb 11.2 oz (74.254 kg)  BMI 26.03 kg/m2  EXAM: General appearance: alert and no distress Neck: no carotid bruit and no JVD Lungs: clear to auscultation bilaterally Heart: regular rate and rhythm, S1, S2 normal, no murmur, click, rub or gallop Abdomen: soft, non-tender; bowel sounds normal; no masses,  no organomegaly Extremities: extremities normal, atraumatic, no cyanosis or edema Pulses: 2+ and symmetric Skin: Skin color, texture, turgor normal. No rashes or lesions Neurologic: Grossly normal Psych: Mood, affect normal  EKG: Normal sinus rhythm at 68  ASSESSMENT: 1. Hypertension-well controlled 2. Dyslipidemia-on Crestor and Zetia 3. Arthritis 4. Headaches  PLAN: 1.   Bridget [name] is doing very well from a cardiac standpoint. Blood pressure appears well controlled on her current regimen. She is due for a recheck of her lipid profile which we'll order today. She reports stable arthritis without any worsening pain. Her headaches are the main issue right now but she seems to recently have better control through the headache clinic. Plan to see her back annually or sooner as needed.  Pixie Casino, MD, [facility] Attending Cardiologist [facility]  Raylynne Cubbage C 04/14/2014, 9:46 AM

## 2014-04-14 NOTE — Patient Instructions (Signed)
Your physician recommends that you return for lab work in: TODAY  Your physician wants you to follow-up in: 1 year with Dr. Debara Pickett. You will receive a reminder letter in the mail two months in advance. If you don't receive a letter, please call our office to schedule the follow-up appointment.

## 2014-04-16 LAB — NMR LIPOPROFILE WITH LIPIDS
Cholesterol, Total: 146 mg/dL (ref 100–199)
HDL Particle Number: 35.3 umol/L (ref >=30.5)
HDL Size: 9.5 nm (ref >=9.2)
HDL-C: 59 mg/dL (ref >39)
LDL CALC: 68 mg/dL (ref 0–99)
LDL Particle Number: 910 nmol/L (ref <1000)
LDL Size: 20.6 nm (ref >=20.8)
LP-IR Score: 33 (ref <=45)
Large HDL-P: 8.5 umol/L (ref >=4.8)
Large VLDL-P: 2 nmol/L (ref <=2.7)
Small LDL Particle Number: 316 nmol/L (ref <=527)
TRIGLYCERIDES: 94 mg/dL (ref 0–149)
VLDL Size: 44.4 nm (ref <=46.6)

## 2014-04-17 ENCOUNTER — Encounter: Payer: Self-pay | Admitting: Internal Medicine

## 2014-04-17 ENCOUNTER — Encounter: Payer: Self-pay | Admitting: *Deleted

## 2014-04-24 DIAGNOSIS — G43719 Chronic migraine without aura, intractable, without status migrainosus: Secondary | ICD-10-CM | POA: Diagnosis not present

## 2014-04-24 DIAGNOSIS — R51 Headache: Secondary | ICD-10-CM | POA: Diagnosis not present

## 2014-04-24 DIAGNOSIS — G518 Other disorders of facial nerve: Secondary | ICD-10-CM | POA: Diagnosis not present

## 2014-04-24 DIAGNOSIS — M542 Cervicalgia: Secondary | ICD-10-CM | POA: Diagnosis not present

## 2014-04-24 DIAGNOSIS — M791 Myalgia: Secondary | ICD-10-CM | POA: Diagnosis not present

## 2014-04-24 DIAGNOSIS — G43839 Menstrual migraine, intractable, without status migrainosus: Secondary | ICD-10-CM | POA: Diagnosis not present

## 2014-04-24 DIAGNOSIS — G43111 Migraine with aura, intractable, with status migrainosus: Secondary | ICD-10-CM | POA: Diagnosis not present

## 2014-04-27 DIAGNOSIS — Z6826 Body mass index (BMI) 26.0-26.9, adult: Secondary | ICD-10-CM | POA: Diagnosis not present

## 2014-04-27 DIAGNOSIS — Z01419 Encounter for gynecological examination (general) (routine) without abnormal findings: Secondary | ICD-10-CM | POA: Diagnosis not present

## 2014-05-08 DIAGNOSIS — G43111 Migraine with aura, intractable, with status migrainosus: Secondary | ICD-10-CM | POA: Diagnosis not present

## 2014-05-08 DIAGNOSIS — G43719 Chronic migraine without aura, intractable, without status migrainosus: Secondary | ICD-10-CM | POA: Diagnosis not present

## 2014-05-08 DIAGNOSIS — M791 Myalgia: Secondary | ICD-10-CM | POA: Diagnosis not present

## 2014-05-08 DIAGNOSIS — G43839 Menstrual migraine, intractable, without status migrainosus: Secondary | ICD-10-CM | POA: Diagnosis not present

## 2014-05-08 DIAGNOSIS — M542 Cervicalgia: Secondary | ICD-10-CM | POA: Diagnosis not present

## 2014-05-08 DIAGNOSIS — R51 Headache: Secondary | ICD-10-CM | POA: Diagnosis not present

## 2014-05-08 DIAGNOSIS — G518 Other disorders of facial nerve: Secondary | ICD-10-CM | POA: Diagnosis not present

## 2014-05-22 DIAGNOSIS — M542 Cervicalgia: Secondary | ICD-10-CM | POA: Diagnosis not present

## 2014-05-22 DIAGNOSIS — R51 Headache: Secondary | ICD-10-CM | POA: Diagnosis not present

## 2014-05-22 DIAGNOSIS — G43719 Chronic migraine without aura, intractable, without status migrainosus: Secondary | ICD-10-CM | POA: Diagnosis not present

## 2014-05-22 DIAGNOSIS — G43839 Menstrual migraine, intractable, without status migrainosus: Secondary | ICD-10-CM | POA: Diagnosis not present

## 2014-05-22 DIAGNOSIS — G43111 Migraine with aura, intractable, with status migrainosus: Secondary | ICD-10-CM | POA: Diagnosis not present

## 2014-05-22 DIAGNOSIS — G518 Other disorders of facial nerve: Secondary | ICD-10-CM | POA: Diagnosis not present

## 2014-05-22 DIAGNOSIS — M791 Myalgia: Secondary | ICD-10-CM | POA: Diagnosis not present

## 2014-06-09 DIAGNOSIS — M791 Myalgia: Secondary | ICD-10-CM | POA: Diagnosis not present

## 2014-06-09 DIAGNOSIS — G518 Other disorders of facial nerve: Secondary | ICD-10-CM | POA: Diagnosis not present

## 2014-06-09 DIAGNOSIS — G43839 Menstrual migraine, intractable, without status migrainosus: Secondary | ICD-10-CM | POA: Diagnosis not present

## 2014-06-09 DIAGNOSIS — M542 Cervicalgia: Secondary | ICD-10-CM | POA: Diagnosis not present

## 2014-06-09 DIAGNOSIS — G43111 Migraine with aura, intractable, with status migrainosus: Secondary | ICD-10-CM | POA: Diagnosis not present

## 2014-06-09 DIAGNOSIS — G43719 Chronic migraine without aura, intractable, without status migrainosus: Secondary | ICD-10-CM | POA: Diagnosis not present

## 2014-06-09 DIAGNOSIS — R51 Headache: Secondary | ICD-10-CM | POA: Diagnosis not present

## 2014-06-26 DIAGNOSIS — Z79899 Other long term (current) drug therapy: Secondary | ICD-10-CM | POA: Diagnosis not present

## 2014-06-26 DIAGNOSIS — I10 Essential (primary) hypertension: Secondary | ICD-10-CM | POA: Diagnosis not present

## 2014-06-26 DIAGNOSIS — E782 Mixed hyperlipidemia: Secondary | ICD-10-CM | POA: Diagnosis not present

## 2014-06-26 DIAGNOSIS — Z0000 Encounter for general adult medical examination without abnormal findings: Secondary | ICD-10-CM | POA: Diagnosis not present

## 2014-06-26 DIAGNOSIS — E039 Hypothyroidism, unspecified: Secondary | ICD-10-CM | POA: Diagnosis not present

## 2014-06-26 DIAGNOSIS — G608 Other hereditary and idiopathic neuropathies: Secondary | ICD-10-CM | POA: Diagnosis not present

## 2014-07-15 DIAGNOSIS — C44329 Squamous cell carcinoma of skin of other parts of face: Secondary | ICD-10-CM | POA: Diagnosis not present

## 2014-07-15 DIAGNOSIS — L853 Xerosis cutis: Secondary | ICD-10-CM | POA: Diagnosis not present

## 2014-07-15 DIAGNOSIS — L821 Other seborrheic keratosis: Secondary | ICD-10-CM | POA: Diagnosis not present

## 2014-07-15 DIAGNOSIS — L820 Inflamed seborrheic keratosis: Secondary | ICD-10-CM | POA: Diagnosis not present

## 2014-07-23 DIAGNOSIS — G43719 Chronic migraine without aura, intractable, without status migrainosus: Secondary | ICD-10-CM | POA: Diagnosis not present

## 2014-07-23 DIAGNOSIS — G43111 Migraine with aura, intractable, with status migrainosus: Secondary | ICD-10-CM | POA: Diagnosis not present

## 2014-07-23 DIAGNOSIS — G43839 Menstrual migraine, intractable, without status migrainosus: Secondary | ICD-10-CM | POA: Diagnosis not present

## 2014-09-22 DIAGNOSIS — M5416 Radiculopathy, lumbar region: Secondary | ICD-10-CM | POA: Diagnosis not present

## 2014-09-22 DIAGNOSIS — M549 Dorsalgia, unspecified: Secondary | ICD-10-CM | POA: Diagnosis not present

## 2014-09-23 DIAGNOSIS — G43719 Chronic migraine without aura, intractable, without status migrainosus: Secondary | ICD-10-CM | POA: Diagnosis not present

## 2014-09-23 DIAGNOSIS — G43111 Migraine with aura, intractable, with status migrainosus: Secondary | ICD-10-CM | POA: Diagnosis not present

## 2014-09-23 DIAGNOSIS — G43839 Menstrual migraine, intractable, without status migrainosus: Secondary | ICD-10-CM | POA: Diagnosis not present

## 2014-10-21 ENCOUNTER — Encounter: Payer: Self-pay | Admitting: Internal Medicine

## 2014-10-23 DIAGNOSIS — M545 Low back pain: Secondary | ICD-10-CM | POA: Diagnosis not present

## 2014-10-23 DIAGNOSIS — M5416 Radiculopathy, lumbar region: Secondary | ICD-10-CM | POA: Diagnosis not present

## 2014-10-23 DIAGNOSIS — M549 Dorsalgia, unspecified: Secondary | ICD-10-CM | POA: Diagnosis not present

## 2014-10-28 DIAGNOSIS — M47816 Spondylosis without myelopathy or radiculopathy, lumbar region: Secondary | ICD-10-CM | POA: Diagnosis not present

## 2014-10-28 DIAGNOSIS — M5126 Other intervertebral disc displacement, lumbar region: Secondary | ICD-10-CM | POA: Diagnosis not present

## 2014-10-28 DIAGNOSIS — M4806 Spinal stenosis, lumbar region: Secondary | ICD-10-CM | POA: Diagnosis not present

## 2014-11-02 DIAGNOSIS — M5416 Radiculopathy, lumbar region: Secondary | ICD-10-CM | POA: Diagnosis not present

## 2014-11-02 DIAGNOSIS — M545 Low back pain: Secondary | ICD-10-CM | POA: Diagnosis not present

## 2014-11-12 ENCOUNTER — Other Ambulatory Visit: Payer: Self-pay | Admitting: Neurosurgery

## 2014-11-12 DIAGNOSIS — Z6827 Body mass index (BMI) 27.0-27.9, adult: Secondary | ICD-10-CM | POA: Diagnosis not present

## 2014-11-12 DIAGNOSIS — M4807 Spinal stenosis, lumbosacral region: Secondary | ICD-10-CM | POA: Diagnosis not present

## 2014-11-12 DIAGNOSIS — M549 Dorsalgia, unspecified: Secondary | ICD-10-CM | POA: Diagnosis not present

## 2014-11-12 NOTE — Progress Notes (Signed)
Pt denies SOB and chest pain but is under the care of Dr. Debara Pickett, cardiology. Pt denies having a stress test and cardiac cath. Pt made aware to stop taking Aspirin, otc vitamins and herbal medications. Do not take any NSAIDs ie: Ibuprofen, Advil, Naproxen or any medication containing Aspirin.Pt verbalized understanding of all pre-op instructions.

## 2014-11-12 NOTE — H&P (Signed)
Bridget [name] is an 74 y.o. female.   Chief Complaint: right leg pain HPI: patient who came to my office with a walker with pain coming from her back to the right foot for several weeks, no better with treatment. The pain is 10/10. Can not sit. 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  Past Medical History  Diagnosis Date  . Seasonal allergies   . Arthritis     hip OA  . 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 GERD (gastroesophageal reflux disease)   . Hyperlipidemia   . Hypertension   . Neuropathy   . Hypothyroidism   . Degenerative disc disease   . Migraines     Past Surgical History  Procedure Laterality Date  . 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 Total hip arthroplasty Left 04/2011  . Total abdominal hysterectomy  1978  . Cervical discectomy  2005    with fusion  . Cholecystectomy  2006  . Transthoracic echocardiogram  12/2008    EF=>55%, borderline conc LVH; trace MR; mod TR; trace AV regurg    Family History  Problem Relation Age of Onset  . Rectal cancer Neg Hx   . Stomach cancer Neg Hx   . Colon cancer Cousin   . Esophageal cancer Cousin   . Stroke Mother 46  . Coronary artery disease Father 11  . Stroke Maternal Grandfather   . Stroke Paternal Grandmother   . Breast cancer Paternal Grandmother    Social History:  reports that she has never smoked. She has never used smokeless tobacco. She reports that she does not drink alcohol or use illicit drugs.  Allergies:  Allergies  Allergen Reactions  . Codeine Anaphylaxis  . Sulfonamide Derivatives Anaphylaxis  . Amlodipine Other (See Comments)    swelling    No prescriptions prior to admission    No results found for this or any previous visit (from the past 48 hour(s)). No results found.  Review of Systems  Constitutional: Negative.   HENT: Negative.   Eyes: Negative.   Respiratory: Negative.   Cardiovascular: Negative.    Gastrointestinal: Negative.   Genitourinary: Negative.   Musculoskeletal: Positive for back pain.  Skin: Negative.   Neurological: Positive for sensory change and focal weakness.  Endo/Heme/Allergies: Negative.   Psychiatric/Behavioral: Negative.     There were no vitals taken for this visit. Physical Exam  Constitutional: She appears well-developed.  HENT:  Head: Normocephalic.  Eyes: Pupils are equal, round, and reactive to light.  Neck: Normal range of motion.  Sacr from previous surgery  Cardiovascular: Normal rate.   Respiratory: Effort normal.  GI: Soft.  Musculoskeletal: Normal range of motion.  Scar from hip surgery  Neurological: She is alert. She has normal reflexes. A cranial nerve deficit is present. Coordination abnormal.  Weakness of DF right foot with SLR positive at 45 in the right.can not walk in tiptoes with right foot.     Assessment/Plan Patient to go ahead with a right l4-5 discectomy. She and her husband are aware of risks and benefits such as csf leak,infection, no improvement and need of further surgery  Jaquila Santelli M 11/12/2014, 10:05 PM

## 2014-11-13 ENCOUNTER — Observation Stay (HOSPITAL_COMMUNITY)
Admission: AD | Admit: 2014-11-13 | Discharge: 2014-11-15 | Disposition: A | Discharge status: Home / Self Care | Payer: Medicare Other | Source: Ambulatory Visit | Attending: Neurosurgery | Admitting: Neurosurgery

## 2014-11-13 ENCOUNTER — Ambulatory Visit (HOSPITAL_COMMUNITY): Payer: Medicare Other | Admitting: Certified Registered"

## 2014-11-13 ENCOUNTER — Encounter (HOSPITAL_COMMUNITY)
Admission: AD | Disposition: A | Discharge status: Home / Self Care | Payer: Self-pay | Source: Ambulatory Visit | Attending: Neurosurgery

## 2014-11-13 ENCOUNTER — Ambulatory Visit (HOSPITAL_COMMUNITY): Payer: Medicare Other

## 2014-11-13 ENCOUNTER — Encounter (HOSPITAL_COMMUNITY): Payer: Self-pay | Admitting: *Deleted

## 2014-11-13 DIAGNOSIS — Z888 Allergy status to other drugs, medicaments and biological substances status: Secondary | ICD-10-CM | POA: Insufficient documentation

## 2014-11-13 DIAGNOSIS — E039 Hypothyroidism, unspecified: Secondary | ICD-10-CM | POA: Insufficient documentation

## 2014-11-13 DIAGNOSIS — Z882 Allergy status to sulfonamides status: Secondary | ICD-10-CM | POA: Diagnosis not present

## 2014-11-13 DIAGNOSIS — M79604 Pain in right leg: Secondary | ICD-10-CM | POA: Insufficient documentation

## 2014-11-13 DIAGNOSIS — I10 Essential (primary) hypertension: Secondary | ICD-10-CM | POA: Insufficient documentation

## 2014-11-13 DIAGNOSIS — R531 Weakness: Secondary | ICD-10-CM | POA: Diagnosis not present

## 2014-11-13 DIAGNOSIS — Z96651 Presence of right artificial knee joint: Secondary | ICD-10-CM | POA: Diagnosis not present

## 2014-11-13 DIAGNOSIS — G629 Polyneuropathy, unspecified: Secondary | ICD-10-CM | POA: Diagnosis not present

## 2014-11-13 DIAGNOSIS — Z9889 Other specified postprocedural states: Secondary | ICD-10-CM | POA: Diagnosis not present

## 2014-11-13 DIAGNOSIS — M5136 Other intervertebral disc degeneration, lumbar region: Secondary | ICD-10-CM | POA: Insufficient documentation

## 2014-11-13 DIAGNOSIS — Z9049 Acquired absence of other specified parts of digestive tract: Secondary | ICD-10-CM | POA: Insufficient documentation

## 2014-11-13 DIAGNOSIS — M1610 Unilateral primary osteoarthritis, unspecified hip: Secondary | ICD-10-CM | POA: Insufficient documentation

## 2014-11-13 DIAGNOSIS — Z85828 Personal history of other malignant neoplasm of skin: Secondary | ICD-10-CM | POA: Diagnosis not present

## 2014-11-13 DIAGNOSIS — G43909 Migraine, unspecified, not intractable, without status migrainosus: Secondary | ICD-10-CM | POA: Diagnosis not present

## 2014-11-13 DIAGNOSIS — E785 Hyperlipidemia, unspecified: Secondary | ICD-10-CM | POA: Insufficient documentation

## 2014-11-13 DIAGNOSIS — M1990 Unspecified osteoarthritis, unspecified site: Secondary | ICD-10-CM | POA: Diagnosis not present

## 2014-11-13 DIAGNOSIS — Z823 Family history of stroke: Secondary | ICD-10-CM | POA: Insufficient documentation

## 2014-11-13 DIAGNOSIS — M5126 Other intervertebral disc displacement, lumbar region: Secondary | ICD-10-CM

## 2014-11-13 DIAGNOSIS — Z803 Family history of malignant neoplasm of breast: Secondary | ICD-10-CM | POA: Diagnosis not present

## 2014-11-13 DIAGNOSIS — Z90710 Acquired absence of both cervix and uterus: Secondary | ICD-10-CM | POA: Insufficient documentation

## 2014-11-13 DIAGNOSIS — Z96642 Presence of left artificial hip joint: Secondary | ICD-10-CM | POA: Diagnosis not present

## 2014-11-13 DIAGNOSIS — M4806 Spinal stenosis, lumbar region: Secondary | ICD-10-CM | POA: Diagnosis not present

## 2014-11-13 DIAGNOSIS — K219 Gastro-esophageal reflux disease without esophagitis: Secondary | ICD-10-CM | POA: Diagnosis not present

## 2014-11-13 DIAGNOSIS — Z800 Family history of malignant neoplasm of digestive organs: Secondary | ICD-10-CM | POA: Diagnosis not present

## 2014-11-13 DIAGNOSIS — M5116 Intervertebral disc disorders with radiculopathy, lumbar region: Principal | ICD-10-CM | POA: Insufficient documentation

## 2014-11-13 DIAGNOSIS — Z885 Allergy status to narcotic agent status: Secondary | ICD-10-CM | POA: Diagnosis not present

## 2014-11-13 DIAGNOSIS — Z419 Encounter for procedure for purposes other than remedying health state, unspecified: Secondary | ICD-10-CM

## 2014-11-13 HISTORY — PX: LUMBAR LAMINECTOMY/DECOMPRESSION MICRODISCECTOMY: SHX5026

## 2014-11-13 HISTORY — DX: Other intervertebral disc displacement, lumbar region: M51.26

## 2014-11-13 LAB — CBC
HEMATOCRIT: 41.8 % (ref 36.0–46.0)
HEMOGLOBIN: 13.5 g/dL (ref 12.0–15.0)
MCH: 29.3 pg (ref 26.0–34.0)
MCHC: 32.3 g/dL (ref 30.0–36.0)
MCV: 90.9 fL (ref 78.0–100.0)
Platelets: 131 10*3/uL — ABNORMAL LOW (ref 150–400)
RBC: 4.6 MIL/uL (ref 3.87–5.11)
RDW: 13.7 % (ref 11.5–15.5)
WBC: 6.1 10*3/uL (ref 4.0–10.5)

## 2014-11-13 LAB — SURGICAL PCR SCREEN
MRSA, PCR: NEGATIVE
Staphylococcus aureus: POSITIVE — AB

## 2014-11-13 LAB — BASIC METABOLIC PANEL
ANION GAP: 8 (ref 5–15)
BUN: 20 mg/dL (ref 6–20)
CALCIUM: 9.6 mg/dL (ref 8.9–10.3)
CO2: 26 mmol/L (ref 22–32)
Chloride: 108 mmol/L (ref 101–111)
Creatinine, Ser: 0.75 mg/dL (ref 0.44–1.00)
GFR calc Af Amer: >60 mL/min (ref >60)
GFR calc non Af Amer: >60 mL/min (ref >60)
GLUCOSE: 94 mg/dL (ref 65–99)
POTASSIUM: 3.8 mmol/L (ref 3.5–5.1)
Sodium: 142 mmol/L (ref 135–145)

## 2014-11-13 SURGERY — LUMBAR LAMINECTOMY/DECOMPRESSION MICRODISCECTOMY 1 LEVEL
Anesthesia: General | Site: Spine Lumbar | Laterality: Right

## 2014-11-13 MED ORDER — LACTATED RINGERS IV SOLN
INTRAVENOUS | Status: DC
  Administered 2014-11-13: 09:00:00 via INTRAVENOUS

## 2014-11-13 MED ORDER — ROSUVASTATIN CALCIUM 10 MG PO TABS
10.0000 mg | ORAL_TABLET | Freq: Every day | ORAL | Status: DC
  Administered 2014-11-13 – 2014-11-15 (×3): 10 mg via ORAL
  Filled 2014-11-13 (×3): qty 1

## 2014-11-13 MED ORDER — ONDANSETRON HCL 4 MG/2ML IJ SOLN
4.0000 mg | INTRAMUSCULAR | Status: DC | PRN
  Administered 2014-11-13: 4 mg via INTRAVENOUS
  Filled 2014-11-13: qty 2

## 2014-11-13 MED ORDER — EPHEDRINE SULFATE 50 MG/ML IJ SOLN
INTRAMUSCULAR | Status: AC
  Filled 2014-11-13: qty 1

## 2014-11-13 MED ORDER — CEFAZOLIN SODIUM 1-5 GM-% IV SOLN
1.0000 g | Freq: Three times a day (TID) | INTRAVENOUS | Status: AC
  Administered 2014-11-13 – 2014-11-14 (×2): 1 g via INTRAVENOUS
  Filled 2014-11-13 (×2): qty 50

## 2014-11-13 MED ORDER — FENTANYL CITRATE (PF) 250 MCG/5ML IJ SOLN
INTRAMUSCULAR | Status: AC
  Filled 2014-11-13: qty 5

## 2014-11-13 MED ORDER — DEXAMETHASONE SODIUM PHOSPHATE 10 MG/ML IJ SOLN
INTRAMUSCULAR | Status: DC | PRN
  Administered 2014-11-13: 10 mg via INTRAVENOUS

## 2014-11-13 MED ORDER — ROCURONIUM BROMIDE 100 MG/10ML IV SOLN
INTRAVENOUS | Status: DC | PRN
  Administered 2014-11-13: 40 mg via INTRAVENOUS

## 2014-11-13 MED ORDER — ACETAMINOPHEN 650 MG RE SUPP: 650.0000 mg | RECTAL | Status: DC | PRN

## 2014-11-13 MED ORDER — BUPIVACAINE LIPOSOME 1.3 % IJ SUSP
INTRAMUSCULAR | Status: DC | PRN
  Administered 2014-11-13: 20 mL

## 2014-11-13 MED ORDER — NEOSTIGMINE METHYLSULFATE 10 MG/10ML IV SOLN
INTRAVENOUS | Status: DC | PRN
  Administered 2014-11-13: 4 mg via INTRAVENOUS

## 2014-11-13 MED ORDER — BUPIVACAINE LIPOSOME 1.3 % IJ SUSP
20.0000 mL | INTRAMUSCULAR | Status: AC
  Filled 2014-11-13: qty 20

## 2014-11-13 MED ORDER — EZETIMIBE 10 MG PO TABS
10.0000 mg | ORAL_TABLET | Freq: Every day | ORAL | Status: DC
  Administered 2014-11-14 – 2014-11-15 (×2): 10 mg via ORAL
  Filled 2014-11-13 (×2): qty 1

## 2014-11-13 MED ORDER — METOPROLOL SUCCINATE ER 50 MG PO TB24
50.0000 mg | ORAL_TABLET | Freq: Every day | ORAL | Status: DC
  Administered 2014-11-13 – 2014-11-14 (×2): 50 mg via ORAL
  Filled 2014-11-13 (×4): qty 1

## 2014-11-13 MED ORDER — MIDAZOLAM HCL 5 MG/5ML IJ SOLN
INTRAMUSCULAR | Status: DC | PRN
  Administered 2014-11-13: 1 mg via INTRAVENOUS

## 2014-11-13 MED ORDER — ESTRADIOL 0.025 MG/24HR TD PTWK
0.0250 mg | MEDICATED_PATCH | TRANSDERMAL | Status: DC
  Filled 2014-11-13: qty 1

## 2014-11-13 MED ORDER — LIDOCAINE HCL (CARDIAC) 20 MG/ML IV SOLN
INTRAVENOUS | Status: AC
  Filled 2014-11-13: qty 5

## 2014-11-13 MED ORDER — MUPIROCIN 2 % EX OINT
1.0000 "application " | TOPICAL_OINTMENT | Freq: Once | CUTANEOUS | Status: AC
  Administered 2014-11-13: 1 via TOPICAL

## 2014-11-13 MED ORDER — FENTANYL CITRATE (PF) 100 MCG/2ML IJ SOLN
25.0000 ug | INTRAMUSCULAR | Status: DC | PRN
  Administered 2014-11-13: 25 ug via INTRAVENOUS

## 2014-11-13 MED ORDER — PHENOL 1.4 % MT LIQD: 1.0000 | OROMUCOSAL | Status: DC | PRN

## 2014-11-13 MED ORDER — VANCOMYCIN HCL 1000 MG IV SOLR
INTRAVENOUS | Status: DC | PRN
  Administered 2014-11-13: 1000 mg via TOPICAL

## 2014-11-13 MED ORDER — SODIUM CHLORIDE 0.9 % IJ SOLN
INTRAMUSCULAR | Status: AC
Start: 2014-11-13 — End: 2014-11-13
  Filled 2014-11-13: qty 10

## 2014-11-13 MED ORDER — PHENYLEPHRINE HCL 10 MG/ML IJ SOLN
INTRAMUSCULAR | Status: DC | PRN
  Administered 2014-11-13 (×3): 40 ug via INTRAVENOUS
  Administered 2014-11-13 (×2): 80 ug via INTRAVENOUS
  Administered 2014-11-13 (×3): 40 ug via INTRAVENOUS

## 2014-11-13 MED ORDER — FENTANYL CITRATE (PF) 100 MCG/2ML IJ SOLN
INTRAMUSCULAR | Status: DC | PRN
  Administered 2014-11-13: 100 ug via INTRAVENOUS
  Administered 2014-11-13: 50 ug via INTRAVENOUS

## 2014-11-13 MED ORDER — GLYCOPYRROLATE 0.2 MG/ML IJ SOLN
INTRAMUSCULAR | Status: AC
  Filled 2014-11-13: qty 3

## 2014-11-13 MED ORDER — LIDOCAINE HCL (CARDIAC) 20 MG/ML IV SOLN
INTRAVENOUS | Status: DC | PRN
  Administered 2014-11-13: 70 mg via INTRAVENOUS

## 2014-11-13 MED ORDER — ONDANSETRON HCL 4 MG/2ML IJ SOLN
INTRAMUSCULAR | Status: DC | PRN
  Administered 2014-11-13: 4 mg via INTRAVENOUS

## 2014-11-13 MED ORDER — PHENYLEPHRINE 40 MCG/ML (10ML) SYRINGE FOR IV PUSH (FOR BLOOD PRESSURE SUPPORT)
PREFILLED_SYRINGE | INTRAVENOUS | Status: AC
  Filled 2014-11-13: qty 10

## 2014-11-13 MED ORDER — 0.9 % SODIUM CHLORIDE (POUR BTL) OPTIME
TOPICAL | Status: DC | PRN
  Administered 2014-11-13: 1000 mL

## 2014-11-13 MED ORDER — MORPHINE SULFATE (PF) 2 MG/ML IV SOLN
1.0000 mg | INTRAVENOUS | Status: DC | PRN
  Administered 2014-11-13 (×2): 2 mg via INTRAVENOUS
  Filled 2014-11-13 (×2): qty 1

## 2014-11-13 MED ORDER — ARTIFICIAL TEARS OP OINT
TOPICAL_OINTMENT | OPHTHALMIC | Status: DC | PRN
  Administered 2014-11-13: 1 via OPHTHALMIC

## 2014-11-13 MED ORDER — SODIUM CHLORIDE 0.9 % IV SOLN: 250.0000 mL | INTRAVENOUS | Status: DC

## 2014-11-13 MED ORDER — SODIUM CHLORIDE 0.9 % IJ SOLN: 3.0000 mL | INTRAMUSCULAR | Status: DC | PRN

## 2014-11-13 MED ORDER — GABAPENTIN 600 MG PO TABS
600.0000 mg | ORAL_TABLET | Freq: Three times a day (TID) | ORAL | Status: DC
  Administered 2014-11-13 – 2014-11-15 (×6): 600 mg via ORAL
  Filled 2014-11-13 (×6): qty 1

## 2014-11-13 MED ORDER — MENTHOL 3 MG MT LOZG: 1.0000 | LOZENGE | OROMUCOSAL | Status: DC | PRN

## 2014-11-13 MED ORDER — SODIUM CHLORIDE 0.9 % IJ SOLN
3.0000 mL | Freq: Two times a day (BID) | INTRAMUSCULAR | Status: DC
  Administered 2014-11-13 – 2014-11-15 (×3): 3 mL via INTRAVENOUS

## 2014-11-13 MED ORDER — ACETAMINOPHEN 325 MG PO TABS: 650.0000 mg | ORAL_TABLET | ORAL | Status: DC | PRN

## 2014-11-13 MED ORDER — GLYCOPYRROLATE 0.2 MG/ML IJ SOLN
INTRAMUSCULAR | Status: DC | PRN
  Administered 2014-11-13: 0.6 mg via INTRAVENOUS

## 2014-11-13 MED ORDER — IMIPRAMINE HCL 25 MG PO TABS
25.0000 mg | ORAL_TABLET | Freq: Every day | ORAL | Status: DC
  Administered 2014-11-13 – 2014-11-14 (×2): 25 mg via ORAL
  Filled 2014-11-13 (×3): qty 1

## 2014-11-13 MED ORDER — THROMBIN 5000 UNITS EX SOLR
CUTANEOUS | Status: DC | PRN
  Administered 2014-11-13 (×2): 5000 [IU] via TOPICAL

## 2014-11-13 MED ORDER — MIDAZOLAM HCL 2 MG/2ML IJ SOLN
INTRAMUSCULAR | Status: AC
  Filled 2014-11-13: qty 2

## 2014-11-13 MED ORDER — HEMOSTATIC AGENTS (NO CHARGE) OPTIME
TOPICAL | Status: DC | PRN
  Administered 2014-11-13: 1 via TOPICAL

## 2014-11-13 MED ORDER — BACITRACIN ZINC 500 UNIT/GM EX OINT
TOPICAL_OINTMENT | CUTANEOUS | Status: DC | PRN
  Administered 2014-11-13: 1 via TOPICAL

## 2014-11-13 MED ORDER — MUPIROCIN 2 % EX OINT
TOPICAL_OINTMENT | CUTANEOUS | Status: AC
  Filled 2014-11-13: qty 22

## 2014-11-13 MED ORDER — IRBESARTAN 75 MG PO TABS
75.0000 mg | ORAL_TABLET | Freq: Every day | ORAL | Status: DC
  Administered 2014-11-13 – 2014-11-15 (×3): 75 mg via ORAL
  Filled 2014-11-13 (×3): qty 1

## 2014-11-13 MED ORDER — VANCOMYCIN HCL 1000 MG IV SOLR
INTRAVENOUS | Status: AC
  Filled 2014-11-13: qty 1000

## 2014-11-13 MED ORDER — PROMETHAZINE HCL 25 MG/ML IJ SOLN: 6.2500 mg | INTRAMUSCULAR | Status: DC | PRN

## 2014-11-13 MED ORDER — ARTIFICIAL TEARS OP OINT
TOPICAL_OINTMENT | OPHTHALMIC | Status: AC
  Filled 2014-11-13: qty 3.5

## 2014-11-13 MED ORDER — ROCURONIUM BROMIDE 50 MG/5ML IV SOLN
INTRAVENOUS | Status: AC
  Filled 2014-11-13: qty 1

## 2014-11-13 MED ORDER — DIAZEPAM 5 MG PO TABS
5.0000 mg | ORAL_TABLET | Freq: Four times a day (QID) | ORAL | Status: DC | PRN
  Administered 2014-11-13 (×2): 5 mg via ORAL
  Filled 2014-11-13 (×2): qty 1

## 2014-11-13 MED ORDER — FENTANYL CITRATE (PF) 100 MCG/2ML IJ SOLN
INTRAMUSCULAR | Status: AC
  Filled 2014-11-13: qty 2

## 2014-11-13 MED ORDER — CEFAZOLIN SODIUM-DEXTROSE 2-3 GM-% IV SOLR
INTRAVENOUS | Status: AC
  Filled 2014-11-13: qty 50

## 2014-11-13 MED ORDER — PROPOFOL 10 MG/ML IV BOLUS
INTRAVENOUS | Status: DC | PRN
  Administered 2014-11-13: 170 mg via INTRAVENOUS

## 2014-11-13 MED ORDER — NEOSTIGMINE METHYLSULFATE 10 MG/10ML IV SOLN
INTRAVENOUS | Status: AC
  Filled 2014-11-13: qty 1

## 2014-11-13 MED ORDER — OXYCODONE-ACETAMINOPHEN 5-325 MG PO TABS
1.0000 | ORAL_TABLET | ORAL | Status: DC | PRN
  Administered 2014-11-13: 2 via ORAL
  Administered 2014-11-13: 1 via ORAL
  Administered 2014-11-14 (×2): 2 via ORAL
  Filled 2014-11-13: qty 1
  Filled 2014-11-13 (×3): qty 2

## 2014-11-13 MED ORDER — LACTATED RINGERS IV SOLN
INTRAVENOUS | Status: DC | PRN
  Administered 2014-11-13 (×2): via INTRAVENOUS

## 2014-11-13 MED ORDER — ONDANSETRON HCL 4 MG/2ML IJ SOLN
INTRAMUSCULAR | Status: AC
  Filled 2014-11-13: qty 2

## 2014-11-13 MED ORDER — CEFAZOLIN SODIUM-DEXTROSE 2-3 GM-% IV SOLR
2.0000 g | INTRAVENOUS | Status: AC
  Administered 2014-11-13: 2 g via INTRAVENOUS

## 2014-11-13 MED ORDER — LEVOTHYROXINE SODIUM 75 MCG PO TABS
75.0000 ug | ORAL_TABLET | Freq: Every day | ORAL | Status: DC
  Administered 2014-11-14 – 2014-11-15 (×2): 75 ug via ORAL
  Filled 2014-11-13 (×2): qty 1
  Filled 2014-11-13: qty 3
  Filled 2014-11-13: qty 1
  Filled 2014-11-13: qty 3

## 2014-11-13 MED ORDER — SODIUM CHLORIDE 0.9 % IV SOLN
INTRAVENOUS | Status: DC
  Administered 2014-11-13: 15:00:00 via INTRAVENOUS

## 2014-11-13 SURGICAL SUPPLY — 59 items
BENZOIN TINCTURE PRP APPL 2/3 (GAUZE/BANDAGES/DRESSINGS) ×2 IMPLANT
BLADE CLIPPER SURG (BLADE) IMPLANT
BUR ACORN 6.0 (BURR) ×2 IMPLANT
BUR MATCHSTICK NEURO 3.0 LAGG (BURR) ×2 IMPLANT
CANISTER SUCT 3000ML PPV (MISCELLANEOUS) ×2 IMPLANT
DRAPE LAPAROTOMY 100X72X124 (DRAPES) ×2 IMPLANT
DRAPE MICROSCOPE LEICA (MISCELLANEOUS) ×2 IMPLANT
DRAPE POUCH INSTRU U-SHP 10X18 (DRAPES) ×2 IMPLANT
DRSG OPSITE POSTOP 4X6 (GAUZE/BANDAGES/DRESSINGS) ×2 IMPLANT
DRSG PAD ABDOMINAL 8X10 ST (GAUZE/BANDAGES/DRESSINGS) IMPLANT
DURAPREP 26ML APPLICATOR (WOUND CARE) ×2 IMPLANT
DURASEAL APPLICATOR TIP (TIP) ×2 IMPLANT
DURASEAL SPINE SEALANT 3ML (MISCELLANEOUS) ×2 IMPLANT
ELECT REM PT RETURN 9FT ADLT (ELECTROSURGICAL) ×2
ELECTRODE REM PT RTRN 9FT ADLT (ELECTROSURGICAL) ×1 IMPLANT
GAUZE SPONGE 4X4 12PLY STRL (GAUZE/BANDAGES/DRESSINGS) IMPLANT
GAUZE SPONGE 4X4 16PLY XRAY LF (GAUZE/BANDAGES/DRESSINGS) IMPLANT
GLOVE BIO SURGEON STRL SZ 6.5 (GLOVE) ×6 IMPLANT
GLOVE BIO SURGEON STRL SZ8 (GLOVE) ×2 IMPLANT
GLOVE BIO SURGEON STRL SZ8.5 (GLOVE) ×2 IMPLANT
GLOVE BIOGEL M 8.0 STRL (GLOVE) ×2 IMPLANT
GLOVE BIOGEL PI IND STRL 6.5 (GLOVE) ×3 IMPLANT
GLOVE BIOGEL PI IND STRL 7.0 (GLOVE) ×1 IMPLANT
GLOVE BIOGEL PI INDICATOR 6.5 (GLOVE) ×3
GLOVE BIOGEL PI INDICATOR 7.0 (GLOVE) ×1
GLOVE ECLIPSE 6.5 STRL STRAW (GLOVE) ×2 IMPLANT
GLOVE EXAM NITRILE LRG STRL (GLOVE) IMPLANT
GLOVE EXAM NITRILE MD LF STRL (GLOVE) IMPLANT
GLOVE EXAM NITRILE XL STR (GLOVE) IMPLANT
GLOVE EXAM NITRILE XS STR PU (GLOVE) IMPLANT
GOWN STRL REUS W/ TWL LRG LVL3 (GOWN DISPOSABLE) ×2 IMPLANT
GOWN STRL REUS W/ TWL XL LVL3 (GOWN DISPOSABLE) ×2 IMPLANT
GOWN STRL REUS W/TWL 2XL LVL3 (GOWN DISPOSABLE) IMPLANT
GOWN STRL REUS W/TWL LRG LVL3 (GOWN DISPOSABLE) ×2
GOWN STRL REUS W/TWL XL LVL3 (GOWN DISPOSABLE) ×2
KIT BASIN OR (CUSTOM PROCEDURE TRAY) ×2 IMPLANT
KIT ROOM TURNOVER OR (KITS) ×2 IMPLANT
NEEDLE HYPO 18GX1.5 BLUNT FILL (NEEDLE) IMPLANT
NEEDLE HYPO 21X1.5 SAFETY (NEEDLE) ×2 IMPLANT
NEEDLE HYPO 25X1 1.5 SAFETY (NEEDLE) ×2 IMPLANT
NEEDLE SPNL 20GX3.5 QUINCKE YW (NEEDLE) ×2 IMPLANT
NS IRRIG 1000ML POUR BTL (IV SOLUTION) ×2 IMPLANT
PACK LAMINECTOMY NEURO (CUSTOM PROCEDURE TRAY) ×2 IMPLANT
PAD ARMBOARD 7.5X6 YLW CONV (MISCELLANEOUS) ×10 IMPLANT
PATTIES SURGICAL .5 X1 (DISPOSABLE) IMPLANT
RUBBERBAND STERILE (MISCELLANEOUS) ×4 IMPLANT
SPONGE LAP 4X18 X RAY DECT (DISPOSABLE) IMPLANT
SPONGE SURGIFOAM ABS GEL SZ50 (HEMOSTASIS) ×2 IMPLANT
STAPLER VISISTAT 35W (STAPLE) ×2 IMPLANT
STRIP CLOSURE SKIN 1/2X4 (GAUZE/BANDAGES/DRESSINGS) ×2 IMPLANT
SUT VIC AB 0 CT1 18XCR BRD8 (SUTURE) ×1 IMPLANT
SUT VIC AB 0 CT1 8-18 (SUTURE) ×1
SUT VIC AB 2-0 CP2 18 (SUTURE) ×2 IMPLANT
SUT VIC AB 3-0 SH 8-18 (SUTURE) ×2 IMPLANT
SYR 20ML ECCENTRIC (SYRINGE) IMPLANT
SYR 5ML LL (SYRINGE) IMPLANT
TOWEL OR 17X24 6PK STRL BLUE (TOWEL DISPOSABLE) ×2 IMPLANT
TOWEL OR 17X26 10 PK STRL BLUE (TOWEL DISPOSABLE) ×2 IMPLANT
WATER STERILE IRR 1000ML POUR (IV SOLUTION) ×2 IMPLANT

## 2014-11-13 NOTE — Transfer of Care (Signed)
Immediate Anesthesia Transfer of Care Note  Patient: Bridget [name]  Procedure(s) Performed: Procedure(s) with comments: Right Lumbar four-five Microdiskectomy (Right) - right  Patient Location: PACU  Anesthesia Type:General  Level of Consciousness: oriented, sedated and patient cooperative  Airway & Oxygen Therapy: Patient Spontanous Breathing and Patient connected to nasal cannula oxygen  Post-op Assessment: Report given to RN, Post -op Vital signs reviewed and stable and Patient moving all extremities X 4  Post vital signs: Reviewed and stable  Last Vitals:  Filed Vitals:   11/13/14 0838  BP: 136/78  Pulse: 73  Temp: 36.4 C  Resp: 20    Complications: No apparent anesthesia complications

## 2014-11-13 NOTE — Progress Notes (Signed)
Patient ID: Bridget [name], female   DOB: 1940/07/16, 74 y.o.   MRN: [id number] Thurmon Fair, no leg pain as preop.c/o incisional pain

## 2014-11-13 NOTE — Anesthesia Procedure Notes (Signed)
Procedure Name: Intubation Date/Time: 11/13/2014 9:59 AM Performed by: Gaylene Brooks Pre-anesthesia Checklist: Emergency Drugs available, Patient identified, Timeout performed, Suction available and Patient being monitored Patient Re-evaluated:Patient Re-evaluated prior to inductionOxygen Delivery Method: Circle system utilized Preoxygenation: Pre-oxygenation with 100% oxygen Intubation Type: IV induction Ventilation: Mask ventilation without difficulty Laryngoscope Size: Miller and 2 Grade View: Grade II Tube type: Oral Tube size: 7.0 mm Number of attempts: 1 Airway Equipment and Method: Stylet Placement Confirmation: ETT inserted through vocal cords under direct vision,  breath sounds checked- equal and bilateral and positive ETCO2 Secured at: 22 cm Tube secured with: Tape Dental Injury: Teeth and Oropharynx as per pre-operative assessment

## 2014-11-13 NOTE — Anesthesia Preprocedure Evaluation (Addendum)
Anesthesia Evaluation  Patient identified by MRN, date of birth, ID band Patient awake    History of Anesthesia Complications (+) PONV  Airway Mallampati: I  TM Distance: >3 FB Neck ROM: Full    Dental  (+) Teeth Intact, Caps, Dental Advisory Given   Pulmonary  breath sounds clear to auscultation        Cardiovascular hypertension, Rhythm:Regular Rate:Normal     Neuro/Psych  Headaches,    GI/Hepatic GERD-  ,  Endo/Other  Hypothyroidism   Renal/GU      Musculoskeletal  (+) Arthritis -,   Abdominal   Peds  Hematology   Anesthesia Other Findings   Reproductive/Obstetrics                            Anesthesia Physical Anesthesia Plan  ASA: III  Anesthesia Plan: General   Post-op Pain Management:    Induction: Intravenous  Airway Management Planned: Oral ETT  Additional Equipment:   Intra-op Plan:   Post-operative Plan: Extubation in OR  Informed Consent:   Plan Discussed with: CRNA, Anesthesiologist and Surgeon  Anesthesia Plan Comments:         Anesthesia Quick Evaluation

## 2014-11-13 NOTE — Progress Notes (Signed)
Patient admitted from PACU. Patient is alert and oriented x 4. Patient oriented to room. Patient resting at this time.

## 2014-11-13 NOTE — Anesthesia Postprocedure Evaluation (Signed)
  Anesthesia Post-op Note  Patient: Bridget [name]  Procedure(s) Performed: Procedure(s) with comments: Right Lumbar four-five Microdiskectomy (Right) - right  Patient Location: PACU  Anesthesia Type:General  Level of Consciousness: awake  Airway and Oxygen Therapy: Patient Spontanous Breathing  Post-op Pain: mild  Post-op Assessment: Post-op Vital signs reviewed LLE Motor Response: Purposeful movement, Responds to commands LLE Sensation: No numbness RLE Motor Response: Purposeful movement, Responds to commands RLE Sensation: No numbness      Post-op Vital Signs: Reviewed  Last Vitals:  Filed Vitals:   11/13/14 1200  BP: 138/65  Pulse: 72  Temp: 36.2 C  Resp: 13    Complications: No apparent anesthesia complications

## 2014-11-14 DIAGNOSIS — M5116 Intervertebral disc disorders with radiculopathy, lumbar region: Secondary | ICD-10-CM | POA: Diagnosis not present

## 2014-11-14 NOTE — Care Management Note (Signed)
Case Management Note  Patient Details  Name: KEEANNA VILLAFRANCA MRN: [id number] Date of Birth: [birth year]  Subjective/Objective:                    Action/Plan:   Expected Discharge Date:                  Expected Discharge Plan:     In-House Referral:     Discharge planning Services     Post Acute Care Choice:    Choice offered to:     DME Arranged:    DME Agency:     HH Arranged:    [place] Agency:     Status of Service:     Medicare Important Message Given:   yes Date Medicare IM Given:   11/14/14 Medicare IM give by:   Frann Rider, RN, BSN Date Additional Medicare IM Given:    Additional Medicare Important Message give by:     If discussed at [place] of Stay Meetings, dates discussed:    Additional Comments:  Norina Buzzard, RN 11/14/2014, 10:23 AM

## 2014-11-14 NOTE — Evaluation (Signed)
Occupational Therapy Evaluation Patient Details Name: MICHAELANNE BOATWRIGHT MRN: [id number] DOB: 11-25-1940 Today's Date: 11/14/2014    History of Present Illness Patient is a 74 yo married female s/p right L4-5 laminotomy, 4-5 diskectomy, and decompression on 11/13/14.    Clinical Impression   Patient is s/p L4-5 lamintomy surgery resulting in functional limitations due to the deficits listed below (see OT problem list). PTA independent with all adls. Patient will benefit from skilled OT acutely to increase independence and safety with ADLS to allow discharge home without follow up. PT educated on fall risk and need to have husband initially assist first two days at home due to LOB during session. Pt educated on setting alarm at night for pain medication and pet care for dog at home.     Follow Up Recommendations  No OT follow up    Equipment Recommendations  None recommended by OT    Recommendations for Other Services       Precautions / Restrictions Precautions Precautions: Back Precaution Booklet Issued: Yes (comment) Precaution Comments: reviewed booklet with patient and husband (can verbalize 3/3 but needs cues during fuctional tasks) Restrictions Weight Bearing Restrictions: No      Mobility Bed Mobility               General bed mobility comments: in chair on arrival  Transfers Overall transfer level: Needs assistance Equipment used: Rolling walker (2 wheeled) Transfers: Sit to/from Stand Sit to Stand: Supervision Stand pivot transfers: Min guard       General transfer comment: min cues for hand placement and back precautions    Balance Overall balance assessment: Needs assistance         Standing balance support: No upper extremity supported;During functional activity Standing balance-Leahy Scale: Fair                              ADL Overall ADL's : Needs assistance/impaired     Grooming: Wash/dry hands;Wash/dry face;Oral  care;Applying deodorant;Brushing hair;Min guard;Standing Grooming Details (indicate cue type and reason): LOB with head turn Upper Body Bathing: Standing;Min guard   Lower Body Bathing: Minimal assistance;Sit to/from stand Lower Body Bathing Details (indicate cue type and reason): cues to avoid bending.         Toilet Transfer: Min guard;Ambulation;BSC;RW Toilet Transfer Details (indicate cue type and reason): cues for safety with RW Toileting- Clothing Manipulation and Hygiene: Min guard;Sit to/from stand Toileting - Clothing Manipulation Details (indicate cue type and reason): cues for safety. pt exiting bathroom with mesh panties at knees because pad felll on the floor. Pt advised to pull panties all the way up or down to prevent fall     Functional mobility during ADLs: Minimal assistance;Rolling walker General ADL Comments: pt required cues throughout session on RW safety and not to pick up the RW. pt with x2 LOB during session . pt advised to only transfer with huband initally due to fall risk. Pt with no righting reaction to LOB     Vision     Perception     Praxis      Pertinent Vitals/Pain Pain Assessment: No/denies pain Pain Score: 1  Pain Location: low back, Rt hip Pain Descriptors / Indicators: Sore;Aching Pain Intervention(s): Limited activity within patient's tolerance;Monitored during session     Hand Dominance Right   Extremity/Trunk Assessment Upper Extremity Assessment Upper Extremity Assessment: Overall WFL for tasks assessed   Lower Extremity Assessment Lower Extremity Assessment: Defer  to PT evaluation   Cervical / Trunk Assessment Cervical / Trunk Assessment: Other exceptions (s/p surg)   Communication Communication Communication: No difficulties   Cognition Arousal/Alertness: Awake/alert Behavior During Therapy: WFL for tasks assessed/performed Overall Cognitive Status: Within Functional Limits for tasks assessed       Memory: Decreased  recall of precautions             General Comments       Exercises       Shoulder Instructions      Home Living Family/patient expects to be discharged to:: Private residence Living Arrangements: Spouse/significant other Available Help at Discharge: Family;Available 24 hours/day Type of Home: House Home Access: Stairs to enter [employer] of Steps: 1 Entrance Stairs-Rails: None Home Layout: Able to live on main level with bedroom/bathroom     Bathroom Shower/Tub: Producer, television/film/video: Standard Bathroom Accessibility: Yes   Home Equipment: Environmental consultant - 2 wheels;Bedside commode;Grab bars - tub/shower (uses BSC as shower chair)          Prior Functioning/Environment Level of Independence: Independent with assistive device(s)        Comments: used RW to manage pain    OT Diagnosis: Generalized weakness;Acute pain   OT Problem List: Decreased strength;Decreased activity tolerance;Impaired balance (sitting and/or standing);Decreased safety awareness;Decreased knowledge of use of DME or AE;Decreased knowledge of precautions;Pain   OT Treatment/Interventions: Self-care/ADL training;Therapeutic exercise;DME and/or AE instruction;Therapeutic activities;Cognitive remediation/compensation;Patient/family education;Balance training    OT Goals(Current goals can be found in the care plan section) Acute Rehab OT Goals Patient Stated Goal: to get a bath OT Goal Formulation: With patient Time For Goal Achievement: 11/28/14 Potential to Achieve Goals: Good  OT Frequency: Min 2X/week   Barriers to D/C:            Co-evaluation              End of Session Equipment Utilized During Treatment: Gait belt;Rolling walker Nurse Communication: Mobility status;Precautions  Activity Tolerance: Patient tolerated treatment well Patient left: in chair;with call bell/phone within reach   Time: 8119-1478 OT Time Calculation (min): 33 min Charges:  OT  General Charges $OT Visit: 1 Procedure OT Evaluation $Initial OT Evaluation Tier I: 1 Procedure OT Treatments $Self Care/Home Management : 8-22 mins G-Codes: OT G-codes **NOT FOR INPATIENT CLASS** Functional Assessment Tool Used: clincal judgement Functional Limitation: Self care Self Care Current Status (G9562): At least 1 percent but less than 20 percent impaired, limited or restricted Self Care Goal Status (Z3086): 0 percent impaired, limited or restricted Self Care Discharge Status [phone]): At least 1 percent but less than 20 percent impaired, limited or restricted  Harolyn Rutherford 11/14/2014, 1:21 PM   Mateo Flow   OTR/L Pager: [phone] Office: [phone] .

## 2014-11-14 NOTE — Evaluation (Signed)
Physical Therapy Evaluation Patient Details Name: Bridget [name] MRN: [id number] DOB: 1940/08/15 Today's Date: 11/14/2014   History of Present Illness  Patient is a 74 yo married female s/p right L4-5 laminotomy, 4-5 diskectomy, and decompression on 11/13/14.   Clinical Impression  Patient with decr memory of back precautions during functional mobility, occasionally lost train of thought during conversation.  Patient with supportive family upon discharge home. Will continue to follow patient while on this venue of care to progress mobility.Reviewed back precaution handout, sleeping positions and compensatory strategies.    Follow Up Recommendations Home health PT;Outpatient PT;Supervision/Assistance - 24 hour    Equipment Recommendations  None recommended by PT    Recommendations for Other Services       Precautions / Restrictions Precautions Precautions: Back Precaution Booklet Issued: Yes (comment) Precaution Comments: reviewed booklet with patient and husband (can verbalize 3/3 but needs cues during fuctional tasks) Restrictions Weight Bearing Restrictions: No      Mobility  Bed Mobility               General bed mobility comments: OOB upon entering room (just finished with OT)  Transfers Overall transfer level: Needs assistance Equipment used: Rolling walker (2 wheeled) Transfers: Sit to/from [employer] Sit to Stand: Supervision Stand pivot transfers: Min guard       General transfer comment: min cues for hand placement and back precautions  Ambulation/Gait Ambulation/Gait assistance: Supervision Ambulation Distance (Feet): 175 Feet Assistive device: Rolling walker (2 wheeled) Gait Pattern/deviations: WFL(Within Functional Limits)     General Gait Details: decr trunk rot,slightly slower cadence  Stairs            Wheelchair Mobility    Modified Rankin (Stroke Patients Only)       Balance Overall balance assessment: Modified  Independent                                           Pertinent Vitals/Pain Pain Assessment: 0-10 Pain Score: 1  Pain Location: low back, Rt hip Pain Descriptors / Indicators: Sore;Aching Pain Intervention(s): Limited activity within patient's tolerance;Monitored during session    Home Living Family/patient expects to be discharged to:: Private residence Living Arrangements: Spouse/significant other Available Help at Discharge: Family;Available 24 hours/day Type of Home: House Home Access: Stairs to enter Entrance Stairs-Rails: None Entrance Stairs-Number of Steps: 1 Home Layout: Able to live on main level with bedroom/bathroom Home Equipment: Walker - 2 wheels;Bedside commode (uses BSC as shower chair)      Prior Function Level of Independence: Independent with assistive device(s)         Comments: used RW to manage pain     Hand Dominance   Dominant Hand: Right    Extremity/Trunk Assessment   Upper Extremity Assessment: Defer to OT evaluation           Lower Extremity Assessment: Overall WFL for tasks assessed      Cervical / Trunk Assessment: Normal  Communication   Communication: No difficulties  Cognition Arousal/Alertness: Awake/alert Behavior During Therapy: WFL for tasks assessed/performed Overall Cognitive Status: Within Functional Limits for tasks assessed       Memory: Decreased recall of precautions              General Comments      Exercises        Assessment/Plan    PT Assessment Patient needs continued  PT services  PT Diagnosis Difficulty walking;Acute pain   PT Problem List Decreased activity tolerance;Decreased balance;Decreased mobility;Decreased knowledge of use of DME;Decreased knowledge of precautions;Pain  PT Treatment Interventions DME instruction;Gait training;Stair training;Functional mobility training;Therapeutic activities;Therapeutic exercise;Balance training;Neuromuscular  re-education;Patient/family education   PT Goals (Current goals can be found in the Care Plan section) Acute Rehab PT Goals Patient Stated Goal: go home either later today or tomorrow PT Goal Formulation: With patient/family Time For Goal Achievement: 11/21/14 Potential to Achieve Goals: Good    Frequency Min 5X/week   Barriers to discharge        Co-evaluation               End of Session Equipment Utilized During Treatment: Gait belt Activity Tolerance: Patient tolerated treatment well;No increased pain Patient left: in chair;with call bell/phone within reach;with family/visitor present Nurse Communication: Mobility status    Functional Assessment Tool Used: transfers, gait distance Functional Limitation: Mobility: Walking and moving around Mobility: Walking and Moving Around Current Status (Z6109): At least 20 percent but less than 40 percent impaired, limited or restricted Mobility: Walking and Moving Around Goal Status [phone]): At least 1 percent but less than 20 percent impaired, limited or restricted    Time: 1130-1202 PT Time Calculation (min) (ACUTE ONLY): 32 min   Charges:   PT Evaluation $Initial PT Evaluation Tier I: 1 Procedure PT Treatments $Gait Training: 8-22 mins   PT G Codes:   PT G-Codes **NOT FOR INPATIENT CLASS** Functional Assessment Tool Used: transfers, gait distance Functional Limitation: Mobility: Walking and moving around Mobility: Walking and Moving Around Current Status (U9811): At least 20 percent but less than 40 percent impaired, limited or restricted Mobility: Walking and Moving Around Goal Status [phone]): At least 1 percent but less than 20 percent impaired, limited or restricted   Nestor Lewandowsky, [place] [phone]  Lukasz Rogus 11/14/2014, 1:06 PM

## 2014-11-14 NOTE — Progress Notes (Signed)
Patient ID: Bridget [name], female   DOB: 03/21/1940, 74 y.o.   MRN: [id number] Subjective:  The patient is alert and pleasant. She is in no apparent distress. She feels she needs more physical therapy and wants to go home tomorrow.  Objective: Vital signs in last 24 hours: Temp:  [97.2 F (36.2 C)-98.3 F (36.8 C)] 98.1 F (36.7 C) (08/27 0925) Pulse Rate:  [57-96] 70 (08/27 0925) Resp:  [7-20] 18 (08/27 0925) BP: (95-155)/(52-82) 95/52 mmHg (08/27 0925) SpO2:  [95 %-100 %] 99 % (08/27 0925)  Intake/Output from previous day: 08/26 0701 - 08/27 0700 In: 1300 [I.V.:1300] Out: 500 [Urine:400; Blood:100] Intake/Output this shift:    Physical exam the patient is alert and pleasant. She is moving her lower extremities well. She looks well.  Lab Results:  Recent Labs  11/13/14 0916  WBC 6.1  HGB 13.5  HCT 41.8  PLT 131*   BMET  Recent Labs  11/13/14 0916  NA 142  K 3.8  CL 108  CO2 26  GLUCOSE 94  BUN 20  CREATININE 0.75  CALCIUM 9.6    Studies/Results: Dg Lumbar Spine 2-3 Views  11/13/2014   CLINICAL DATA:  Surgery.  EXAM: LUMBAR SPINE - 2-3 VIEW  COMPARISON:  11/12/2014.  FINDINGS: Lumbar vertebra are numbered with the lowest segmented lumbar shaped vertebra as L5. Metallic marker is noted posteriorly at the L3-L4 level on the first image. On the second image metallic instruments are noted at the L4-L5 level.  IMPRESSION: Lumbar spine intraoperative findings as above.   Electronically Signed   By: Marcello Moores  Register   On: 11/13/2014 12:19    Assessment/Plan: Postop day #1: We will mobilize the patient with PT. She will likely go home tomorrow.  LOS: 1 day     Jordan Caraveo D 11/14/2014, 9:32 AM

## 2014-11-14 NOTE — Op Note (Signed)
Bridget [name], WIELAND NO.:  [id number]  MEDICAL RECORD NO.:  [id number]  LOCATION:  5C13C                        FACILITY:  [facility]  PHYSICIAN:  Leeroy Cha, M.D.   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  PROCEDURE:  Right L4-5 laminotomy, 4-5 diskectomy, and decompression of the L4 and L5 nerve roots.  Foraminotomy.  Microscope.  SURGEON:  Leeroy Cha, M.D.  ASSISTANT:  Ashok Pall, M.D.  CLINICAL HISTORY:  The patient was seen by me yesterday complaining of back pain worsened to the right leg associated with weakness.  The patient has failed conservative treatment.  MRI showed that she has a degenerative disc disease, but at the L4-5, she has a large herniated disc.  Surgery was advised and she and her husband knew the risk and benefits.  PROCEDURE IN DETAIL:  The patient was taken to the OR, and after intubation, she was positioned in a prone manner.  The back was cleaned with Betadine and later on with DuraPrep.  X-rays showed that we were right at the level of L3.  From then on, a midline incision from L4 to L5 was made and muscles were retracted laterally.  We repeated the x-ray which showed that we were right at the level L4-L5.  With the help of the microscope and drill, we removed the lower part of L4, the upper of L5, and the one-third of the medial facet.  The patient had a thick calcified ligament.  Removal was achieved.  Then, we retracted the thecal sac and indeed right underneath the L4, the patient had herniated disc going upward.  Incision was made, fragments were removed.  We entered the disc space and total diskectomy medial and lateral was achieved.  Then, we went  to the foramen to decompress the L4 and L5 nerve root.  There was a small area in the dura mater probably from the epidural injection with a small amount of CSF leak which was taken care easily with coagulation using bipolar.  Valsalva maneuver 3 times up to 40 was negative.  From then on, the area was irrigated.  Vancomycin was left in the operative site, and the wound was closed with Vicryl staple.          ______________________________ Leeroy Cha, M.D.    EB/MEDQ  D:  11/13/2014  T:  11/14/2014  Job:  [id number]

## 2014-11-15 DIAGNOSIS — M5116 Intervertebral disc disorders with radiculopathy, lumbar region: Secondary | ICD-10-CM | POA: Diagnosis not present

## 2014-11-15 MED ORDER — OXYCODONE-ACETAMINOPHEN 5-325 MG PO TABS: 1.0000 | ORAL_TABLET | ORAL | Status: DC | PRN

## 2014-11-15 NOTE — Progress Notes (Signed)
Occupational Therapy Treatment Patient Details Name: Bridget [name] MRN: [id number] DOB: 12-27-1940 Today's Date: 11/15/2014    History of present illness Patient is a 74 yo married female s/p right L4-5 laminotomy, 4-5 diskectomy, and decompression on 11/13/14.    OT comments  Pt progressing towards acute OT goals. Toilet/shower transfers were focus of session as detailed below. D/c plan remains appropriate.   Follow Up Recommendations  No OT follow up    Equipment Recommendations  None recommended by OT    Recommendations for Other Services      Precautions / Restrictions Precautions Precautions: Back Precaution Booklet Issued: Yes (comment) Precaution Comments: Pt with decreased recall of precautions, reviewed. Restrictions Weight Bearing Restrictions: No       Mobility Bed Mobility               General bed mobility comments: in chair on arrival  Transfers Overall transfer level: Needs assistance Equipment used: Rolling walker (2 wheeled) Transfers: Sit to/from Stand Sit to Stand: Supervision         General transfer comment: cues for technique    Balance Overall balance assessment: Needs assistance         Standing balance support: Bilateral upper extremity supported;During functional activity Standing balance-Leahy Scale: Fair                     ADL Overall ADL's : Needs assistance/impaired                         Toilet Transfer: Min guard;Ambulation;RW (3n1 over toilet)         Tub/Shower Transfer Details (indicate cue type and reason): ambualted to bathroom and compelted transfer to 3n1 discussed walk-in shower transfer technique with spouse present. Functional mobility during ADLs: Min guard;Rolling walker General ADL Comments: Pt with decreased recall of precautions. [place] precautions and ADL education with spouse present. Pt completed toilet transfer as detailed above.      Vision                      Perception     Praxis      Cognition   Behavior During Therapy: WFL for tasks assessed/performed Overall Cognitive Status: Within Functional Limits for tasks assessed       Memory: Decreased recall of precautions               Extremity/Trunk Assessment               Exercises     Shoulder Instructions       General Comments      Pertinent Vitals/ Pain       Pain Assessment: 0-10 Pain Score: 2  Pain Location: low back Pain Descriptors / Indicators: Dull Pain Intervention(s): Monitored during session;Repositioned  Home Living                                          Prior Functioning/Environment              Frequency Min 2X/week     Progress Toward Goals  OT Goals(current goals can now be found in the care plan section)  Progress towards OT goals: Progressing toward goals  Acute Rehab OT Goals Patient Stated Goal: to get a bath OT Goal Formulation: With patient Time For Goal Achievement: 11/28/14 Potential to  Achieve Goals: Good ADL Goals Pt Will Perform Lower Body Dressing: with modified independence;sit to/from stand Pt Will Perform Tub/Shower Transfer: Shower transfer;ambulating;rolling walker;with supervision  Plan Discharge plan remains appropriate    Co-evaluation                 End of Session Equipment Utilized During Treatment: Rolling walker   Activity Tolerance Patient tolerated treatment well   Patient Left in chair;with call bell/phone within reach;with family/visitor present   Nurse Communication      Functional Assessment Tool Used: clincal judgement Functional Limitation: Self care Self Care Current Status [phone]): At least 1 percent but less than 20 percent impaired, limited or restricted Self Care Goal Status (B2841): 0 percent impaired, limited or restricted Self Care Discharge Status [phone]): At least 1 percent but less than 20 percent impaired, limited or restricted   Time:  1008-1027 OT Time Calculation (min): 19 min  Charges: OT G-codes **NOT FOR INPATIENT CLASS** Functional Assessment Tool Used: clincal judgement Functional Limitation: Self care Self Care Current Status (N0272): At least 1 percent but less than 20 percent impaired, limited or restricted Self Care Goal Status (Z3664): 0 percent impaired, limited or restricted Self Care Discharge Status [phone]): At least 1 percent but less than 20 percent impaired, limited or restricted OT General Charges $OT Visit: 1 Procedure OT Treatments $Self Care/Home Management : 8-22 mins  Pilar Grammes 11/15/2014, 2:17 PM

## 2014-11-15 NOTE — Progress Notes (Signed)
Patient discharged home with husband alert oriented no pain, discharge summary reviewed and prescription given.

## 2014-11-15 NOTE — Discharge Summary (Signed)
Physician Discharge Summary  Patient ID: Bridget [name] MRN: [id number] DOB/AGE: [birth year] 74 y.o.  Admit date: 11/13/2014 Discharge date: 11/15/2014  Admission Diagnoses: L4-5 herniated disc, lumbago, lumbar radiculopathy  Discharge Diagnoses: The same Active Problems:   Lumbar herniated disc   Discharged Condition: good  Hospital Course: Dr. Jeral Fruit performed a L4-5 discectomy on the patient on 11/13/2014.  The patient's postoperative course was unremarkable. On postoperative day #2 she requested discharge to home. The patient and her husband were given oral and written discharge instructions. All their questions were answered.  Consults: Physical therapy Significant Diagnostic Studies: None Treatments: L4-5 discectomy Discharge Exam: Blood pressure 111/60, pulse 71, temperature 98.6 F (37 C), temperature source Oral, resp. rate 20, height 5\' 7"  (1.702 m), weight 74.844 kg (165 lb), SpO2 97 %. The patient is alert and pleasant. She is moving her lower extremities well.  Disposition: Home  Discharge Instructions    Call MD for:  difficulty breathing, headache or visual disturbances    Complete by:  As directed      Call MD for:  extreme fatigue    Complete by:  As directed      Call MD for:  hives    Complete by:  As directed      Call MD for:  persistant dizziness or light-headedness    Complete by:  As directed      Call MD for:  persistant nausea and vomiting    Complete by:  As directed      Call MD for:  redness, tenderness, or signs of infection (pain, swelling, redness, odor or green/yellow discharge around incision site)    Complete by:  As directed      Call MD for:  severe uncontrolled pain    Complete by:  As directed      Call MD for:  temperature >100.4    Complete by:  As directed      Diet - low sodium heart healthy    Complete by:  As directed      Discharge instructions    Complete by:  As directed   Call [phone] for a followup appointment.  Take a stool softener while you are using pain medications.     Driving Restrictions    Complete by:  As directed   Do not drive for 2 weeks.     Increase activity slowly    Complete by:  As directed      Lifting restrictions    Complete by:  As directed   Do not lift more than 5 pounds. No excessive bending or twisting.     May shower / Bathe    Complete by:  As directed   He may shower after the pain she is removed 3 days after surgery. Leave the incision alone.     Remove dressing in 24 hours    Complete by:  As directed             Medication List    STOP taking these medications        HYDROcodone-acetaminophen 10-325 MG per tablet  Commonly known as:  NORCO      TAKE these medications        aspirin 81 MG tablet  Take 81 mg by mouth daily.     estradiol 0.025 mg/24hr patch  Commonly known as:  CLIMARA - Dosed in mg/24 hr  Place 0.025 mg onto the skin once a week.     ezetimibe 10 MG  tablet  Commonly known as:  ZETIA  Take 1 tablet (10 mg total) by mouth daily.     gabapentin 600 MG tablet  Commonly known as:  NEURONTIN  Take 600 mg by mouth 3 (three) times daily.     imipramine 25 MG tablet  Commonly known as:  TOFRANIL  Take 25 mg by mouth at bedtime.     irbesartan 75 MG tablet  Commonly known as:  AVAPRO  Take 75 mg by mouth daily.     levothyroxine 75 MCG tablet  Commonly known as:  SYNTHROID, LEVOTHROID  Take 75 mcg by mouth daily before breakfast.     metoprolol succinate 50 MG 24 hr tablet  Commonly known as:  TOPROL-XL  Take 50 mg by mouth daily. Take with or immediately following a meal.     multivitamin capsule  Take 1 capsule by mouth daily.     oxyCODONE-acetaminophen 5-325 MG per tablet  Commonly known as:  PERCOCET/ROXICET  Take 1-2 tablets by mouth every 4 (four) hours as needed for moderate pain.     rosuvastatin 10 MG tablet  Commonly known as:  CRESTOR  Take 1 tablet (10 mg total) by mouth daily.          SignedTressie Stalker D 11/15/2014, 9:05 AM

## 2014-11-15 NOTE — Progress Notes (Signed)
Physical Therapy Treatment Patient Details Name: Bridget [name] MRN: [id number] DOB: [birth year] Today's Date: 11/15/2014    History of Present Illness Patient is a 74 yo married female s/p right L4-5 laminotomy, 4-5 diskectomy, and decompression on 11/13/14.     PT Comments    Pt progressing well with PT goals.  Completed stair training this session.  Pt & husband with concerns/questions about car transfers- verbalized technique.    Follow Up Recommendations   **Pt states she does not want f/u therapy at this time**     Equipment Recommendations  None recommended by PT    Recommendations for Other Services       Precautions / Restrictions Precautions Precautions: Back Precaution Comments: pt able to verbalize 3/3 back precautions Restrictions Weight Bearing Restrictions: No    Mobility  Bed Mobility                  Transfers Overall transfer level: Modified independent Equipment used: Rolling walker (2 wheeled)                Ambulation/Gait Ambulation/Gait assistance: Supervision Ambulation Distance (Feet): 200 Feet Assistive device: Rolling walker (2 wheeled) Gait Pattern/deviations: Step-through pattern;Decreased stride length Gait velocity: decreased   General Gait Details: decr trunk rot,slightly slower cadence   Stairs Stairs: Yes Stairs assistance: Min guard Stair Management: Two rails;Forwards;Step to pattern Number of Stairs: 2 (2x's) General stair comments: pt used bil rails to simulate using door frame of entrance at home.    Wheelchair Mobility    Modified Rankin (Stroke Patients Only)       Balance                                    Cognition Arousal/Alertness: Awake/alert Behavior During Therapy: WFL for tasks assessed/performed Overall Cognitive Status: Within Functional Limits for tasks assessed                      Exercises      General Comments        Pertinent Vitals/Pain Pain  Assessment: 0-10 Pain Score: 2  Pain Location: low back Pain Descriptors / Indicators: Dull Pain Intervention(s): Monitored during session    Home Living                      Prior Function            PT Goals (current goals can now be found in the care plan section) Acute Rehab PT Goals PT Goal Formulation: With patient/family Time For Goal Achievement: 11/21/14 Potential to Achieve Goals: Good    Frequency  Min 5X/week    PT Plan Current plan remains appropriate    Co-evaluation             End of Session   Activity Tolerance: Patient tolerated treatment well;No increased pain Patient left: in chair;with call bell/phone within reach;with family/visitor present     Time: 1540-0867 PT Time Calculation (min) (ACUTE ONLY): 20 min  Charges:  $Gait Training: 8-22 mins                    G Codes:      Sena Hitch 11/15/2014, 11:44 AM   Sarajane Marek, PTA [phone] 11/15/2014

## 2014-11-17 ENCOUNTER — Encounter (HOSPITAL_COMMUNITY): Payer: Self-pay | Admitting: Neurosurgery

## 2014-12-17 DIAGNOSIS — M4807 Spinal stenosis, lumbosacral region: Secondary | ICD-10-CM | POA: Diagnosis not present

## 2014-12-17 DIAGNOSIS — M545 Low back pain: Secondary | ICD-10-CM | POA: Diagnosis not present

## 2014-12-22 DIAGNOSIS — M545 Low back pain: Secondary | ICD-10-CM | POA: Diagnosis not present

## 2014-12-22 DIAGNOSIS — M4807 Spinal stenosis, lumbosacral region: Secondary | ICD-10-CM | POA: Diagnosis not present

## 2014-12-29 DIAGNOSIS — M545 Low back pain: Secondary | ICD-10-CM | POA: Diagnosis not present

## 2014-12-29 DIAGNOSIS — M4807 Spinal stenosis, lumbosacral region: Secondary | ICD-10-CM | POA: Diagnosis not present

## 2014-12-30 DIAGNOSIS — Z23 Encounter for immunization: Secondary | ICD-10-CM | POA: Diagnosis not present

## 2014-12-31 DIAGNOSIS — M4807 Spinal stenosis, lumbosacral region: Secondary | ICD-10-CM | POA: Diagnosis not present

## 2014-12-31 DIAGNOSIS — M545 Low back pain: Secondary | ICD-10-CM | POA: Diagnosis not present

## 2015-01-04 DIAGNOSIS — M4807 Spinal stenosis, lumbosacral region: Secondary | ICD-10-CM | POA: Diagnosis not present

## 2015-01-04 DIAGNOSIS — M545 Low back pain: Secondary | ICD-10-CM | POA: Diagnosis not present

## 2015-01-07 DIAGNOSIS — M4807 Spinal stenosis, lumbosacral region: Secondary | ICD-10-CM | POA: Diagnosis not present

## 2015-01-07 DIAGNOSIS — M545 Low back pain: Secondary | ICD-10-CM | POA: Diagnosis not present

## 2015-01-12 DIAGNOSIS — M4807 Spinal stenosis, lumbosacral region: Secondary | ICD-10-CM | POA: Diagnosis not present

## 2015-01-12 DIAGNOSIS — M545 Low back pain: Secondary | ICD-10-CM | POA: Diagnosis not present

## 2015-01-14 DIAGNOSIS — M545 Low back pain: Secondary | ICD-10-CM | POA: Diagnosis not present

## 2015-01-14 DIAGNOSIS — M4807 Spinal stenosis, lumbosacral region: Secondary | ICD-10-CM | POA: Diagnosis not present

## 2015-01-21 DIAGNOSIS — E039 Hypothyroidism, unspecified: Secondary | ICD-10-CM | POA: Diagnosis not present

## 2015-01-21 DIAGNOSIS — E782 Mixed hyperlipidemia: Secondary | ICD-10-CM | POA: Diagnosis not present

## 2015-01-28 DIAGNOSIS — G609 Hereditary and idiopathic neuropathy, unspecified: Secondary | ICD-10-CM | POA: Diagnosis not present

## 2015-01-28 DIAGNOSIS — G43709 Chronic migraine without aura, not intractable, without status migrainosus: Secondary | ICD-10-CM | POA: Diagnosis not present

## 2015-04-15 ENCOUNTER — Ambulatory Visit (INDEPENDENT_AMBULATORY_CARE_PROVIDER_SITE_OTHER): Payer: Medicare Other | Admitting: Internal Medicine

## 2015-04-15 ENCOUNTER — Encounter: Payer: Self-pay | Admitting: Internal Medicine

## 2015-04-15 VITALS — BP 110/80 | HR 70 | Ht 66.5 in | Wt 172.2 lb

## 2015-04-15 DIAGNOSIS — M5442 Lumbago with sciatica, left side: Secondary | ICD-10-CM

## 2015-04-15 DIAGNOSIS — I10 Essential (primary) hypertension: Secondary | ICD-10-CM

## 2015-04-15 DIAGNOSIS — E785 Hyperlipidemia, unspecified: Secondary | ICD-10-CM

## 2015-04-15 DIAGNOSIS — M5441 Lumbago with sciatica, right side: Secondary | ICD-10-CM | POA: Diagnosis not present

## 2015-04-15 DIAGNOSIS — M5126 Other intervertebral disc displacement, lumbar region: Secondary | ICD-10-CM

## 2015-04-15 NOTE — Patient Instructions (Signed)
You have been referred to Bridget [name], a registered dietician.  Your physician recommends that you wear compression stockings - 20-30 mmHg. See information provided.  Dr Debara Pickett recommends that you schedule a follow-up appointment in 1 year. You will receive a reminder letter in the mail two months in advance. If you don't receive a letter, please call our office to schedule the follow-up appointment.  If you need a refill on your cardiac medications before your next appointment, please call your pharmacy.

## 2015-04-18 DIAGNOSIS — M549 Dorsalgia, unspecified: Secondary | ICD-10-CM | POA: Insufficient documentation

## 2015-04-18 HISTORY — DX: Dorsalgia, unspecified: M54.9

## 2015-04-18 NOTE — Progress Notes (Signed)
OFFICE NOTE  Chief Complaint:  Back pain, infrequent headaches  Primary Care Physician: Ronita Hipps, MD  HPI:  Bridget [name]  is a 75 year old female I have been seeing annually with a history of fatigue in the past. Actually when I saw her the last time she was short of breath only walking across the kitchen. We did undergo a significant workup which was basically normal. She returns today and says those symptoms have completely resolved and is unclear what they were due to. Unfortunately in February as you know she had a left hip replacement and is due for right knee replacement sometime in the near future. Denies any chest pain, worsening shortness of breath, palpitations, presyncope or syncopal symptoms.  Her main complaint is arthritis which is now developing in her left hip and knee. She's been hesitant to take meloxicam due to concerns about worsening heart disease. She reported she had cholesterol testing several months ago and is due for this again. The only other change is that she was noted to be taken off of amlodipine and switched to benazepril. She's not sure that that works as well for her blood pressure, but it does appear well-controlled today 122/82.  This was due to lower extremity swelling.  I saw Bridget [name] back in the office today. She's been having some problems with recurrent headaches. She's currently go to headache clinic and has been weaned off of all NSAIDs. She denies any chest pain or worsening shortness of breath. Her blood pressure control is been excellent. She's not had reassessment of her cholesterol to my knowledge since her last study in 2014.  Bridget [name] returns today for follow-up. She is being seen Dr. Joya Salm for back pain. She is on gabapentin 3 times daily but continues to have neuropathic symptoms. She also takes Percocet for pain. Blood pressure is well-controlled today. She denies any chest pain or worsening shortness of breath. EKG shows normal  sinus rhythm.  PMHx:  Past Medical History  Diagnosis Date  . Seasonal allergies   . Arthritis     hip OA  . 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 GERD (gastroesophageal reflux disease)   . Hyperlipidemia   . Hypertension   . Neuropathy ([place])   . Hypothyroidism   . Degenerative disc disease   . Migraines     Past Surgical History  Procedure Laterality Date  . 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 Total hip arthroplasty Left 04/2011  . Total abdominal hysterectomy  1978  . Cervical discectomy  2005    with fusion  . Cholecystectomy  2006  . Transthoracic echocardiogram  12/2008    EF=>55%, borderline conc LVH; trace MR; mod TR; trace AV regurg  . Lumbar laminectomy/decompression microdiscectomy Right 11/13/2014    Procedure: Right Lumbar four-five Microdiskectomy;  Surgeon: Leeroy Cha, MD;  Location: [facility] NEURO ORS;  Service: Neurosurgery;  Laterality: Right;  right    FAMHx:  Family History  Problem Relation Age of Onset  . Rectal cancer Neg Hx   . Stomach cancer Neg Hx   . Colon cancer Cousin   . Esophageal cancer Cousin   . Stroke Mother 87  . Coronary artery disease Father 39  . Stroke Maternal Grandfather   . Stroke Paternal Grandmother   . Breast cancer Paternal Grandmother     SOCHx:   reports that she has never smoked. She has never used smokeless tobacco. She reports that she does not drink  alcohol or use illicit drugs.  ALLERGIES:  Allergies  Allergen Reactions  . Codeine Anaphylaxis  . Sulfonamide Derivatives Anaphylaxis  . Amlodipine Other (See Comments)    swelling    ROS: A comprehensive review of systems was negative except for: Musculoskeletal: positive for back pain Neurological: positive for headaches  HOME MEDS: Current Outpatient Prescriptions  Medication Sig Dispense Refill  . aspirin 81 MG tablet Take 81 mg by mouth daily.    . baclofen (LIORESAL) 10 MG tablet As needed    . estradiol (CLIMARA - DOSED IN MG/24 HR) 0.025  mg/24hr Place 0.025 mg onto the skin once a week.     . estradiol (CLIMARA) 0.06 MG/24HR Place 1 patch onto the skin once a week. Use 1 patch once a week    . ezetimibe (ZETIA) 10 MG tablet Take 1 tablet (10 mg total) by mouth daily. 28 tablet 0  . gabapentin (NEURONTIN) 600 MG tablet Take 600 mg by mouth 3 (three) times daily.    Marland Kitchen imipramine (TOFRANIL) 25 MG tablet Take 25 mg by mouth at bedtime.  1  . irbesartan (AVAPRO) 75 MG tablet Take 75 mg by mouth daily.    Marland Kitchen levothyroxine (SYNTHROID, LEVOTHROID) 75 MCG tablet Take 75 mcg by mouth daily before breakfast.    . meloxicam (MOBIC) 7.5 MG tablet As needed up to bid    . metoprolol succinate (TOPROL-XL) 50 MG 24 hr tablet Take 50 mg by mouth daily. Take with or immediately following a meal.    . Multiple Vitamin (MULTIVITAMIN) capsule Take 1 capsule by mouth daily.    Marland Kitchen oxyCODONE-acetaminophen (PERCOCET/ROXICET) 5-325 MG per tablet Take 1-2 tablets by mouth every 4 (four) hours as needed for moderate pain. 100 tablet 0  . rosuvastatin (CRESTOR) 10 MG tablet Take 1 tablet (10 mg total) by mouth daily. 28 tablet 0   No current facility-administered medications for this visit.    LABS/IMAGING: No results found for this or any previous visit (from the past 48 hour(s)). No results found.  VITALS: BP 110/80 mmHg  Pulse 70  Ht 5' 6.5" (1.689 m)  Wt 172 lb 3 oz (78.104 kg)  BMI 27.38 kg/m2  EXAM: General appearance: alert and no distress Neck: no carotid bruit and no JVD Lungs: clear to auscultation bilaterally Heart: regular rate and rhythm, S1, S2 normal, no murmur, click, rub or gallop Abdomen: soft, non-tender; bowel sounds normal; no masses,  no organomegaly Extremities: extremities normal, atraumatic, no cyanosis or edema Pulses: 2+ and symmetric Skin: Skin color, texture, turgor normal. No rashes or lesions Neurologic: Grossly normal Psych: Mood, affect normal  EKG: Normal sinus rhythm at  70  ASSESSMENT: 1. Hypertension-well controlled 2. Dyslipidemia-on Crestor and Zetia 3. Arthritis 4. Headaches 5. LBP -neuropathy  PLAN: 1.   Ms. [name] is doing very well from a cardiac standpoint. Blood pressure appears well controlled on her current regimen. She is due for a recheck of her lipid profile which we'll order today. She reports stable arthritis without any worsening pain. Her headaches are the main issue right now but she seems to recently have better control through the headache clinic. She is seen by Dr. Joya Salm for back pain and neuropathy. She reports some lower extremity swelling which may be treated with compression stockings. In addition, she has had trouble losing weight and is interested in a dietitian recommendation which I'll provide. Plan to see her back annually or sooner as needed.  Pixie Casino, MD, [facility] Attending Cardiologist  Bridget [name] 04/18/2015, 6:33 PM

## 2015-04-21 DIAGNOSIS — Z1231 Encounter for screening mammogram for malignant neoplasm of breast: Secondary | ICD-10-CM | POA: Diagnosis not present

## 2015-05-03 DIAGNOSIS — Z1272 Encounter for screening for malignant neoplasm of vagina: Secondary | ICD-10-CM | POA: Diagnosis not present

## 2015-05-03 DIAGNOSIS — Z90710 Acquired absence of both cervix and uterus: Secondary | ICD-10-CM | POA: Diagnosis not present

## 2015-05-03 DIAGNOSIS — N390 Urinary tract infection, site not specified: Secondary | ICD-10-CM | POA: Diagnosis not present

## 2015-05-03 DIAGNOSIS — Z6828 Body mass index (BMI) 28.0-28.9, adult: Secondary | ICD-10-CM | POA: Diagnosis not present

## 2015-05-03 DIAGNOSIS — Z124 Encounter for screening for malignant neoplasm of cervix: Secondary | ICD-10-CM | POA: Diagnosis not present

## 2015-05-10 DIAGNOSIS — Z6827 Body mass index (BMI) 27.0-27.9, adult: Secondary | ICD-10-CM | POA: Diagnosis not present

## 2015-05-10 DIAGNOSIS — M4807 Spinal stenosis, lumbosacral region: Secondary | ICD-10-CM | POA: Diagnosis not present

## 2015-05-11 ENCOUNTER — Other Ambulatory Visit: Payer: Self-pay | Admitting: Neurosurgery

## 2015-05-11 DIAGNOSIS — M4807 Spinal stenosis, lumbosacral region: Secondary | ICD-10-CM

## 2015-05-19 ENCOUNTER — Other Ambulatory Visit: Payer: Self-pay | Admitting: Radiology

## 2015-05-19 ENCOUNTER — Encounter: Payer: Self-pay | Admitting: Radiology

## 2015-05-19 ENCOUNTER — Ambulatory Visit
Admission: RE | Admit: 2015-05-19 | Discharge: 2015-05-19 | Disposition: A | Discharge status: Home / Self Care | Payer: Medicare Other | Source: Ambulatory Visit | Attending: Neurosurgery | Admitting: Neurosurgery

## 2015-05-19 ENCOUNTER — Other Ambulatory Visit: Payer: Self-pay | Admitting: Neurosurgery

## 2015-05-19 DIAGNOSIS — M545 Low back pain: Secondary | ICD-10-CM | POA: Diagnosis not present

## 2015-05-19 DIAGNOSIS — M4807 Spinal stenosis, lumbosacral region: Secondary | ICD-10-CM

## 2015-05-19 MED ORDER — IOHEXOL 180 MG/ML  SOLN
1.0000 mL | Freq: Once | INTRAMUSCULAR | Status: AC | PRN
  Administered 2015-05-19: 1 mL via EPIDURAL

## 2015-05-19 MED ORDER — METHYLPREDNISOLONE ACETATE 40 MG/ML INJ SUSP (RADIOLOG
120.0000 mg | Freq: Once | INTRAMUSCULAR | Status: AC
  Administered 2015-05-19: 120 mg via EPIDURAL

## 2015-05-19 NOTE — Discharge Instructions (Signed)

## 2015-06-02 DIAGNOSIS — L570 Actinic keratosis: Secondary | ICD-10-CM | POA: Diagnosis not present

## 2015-06-02 DIAGNOSIS — L719 Rosacea, unspecified: Secondary | ICD-10-CM | POA: Diagnosis not present

## 2015-07-16 DIAGNOSIS — L570 Actinic keratosis: Secondary | ICD-10-CM | POA: Diagnosis not present

## 2015-08-02 DIAGNOSIS — G43709 Chronic migraine without aura, not intractable, without status migrainosus: Secondary | ICD-10-CM | POA: Diagnosis not present

## 2015-08-02 DIAGNOSIS — G609 Hereditary and idiopathic neuropathy, unspecified: Secondary | ICD-10-CM | POA: Diagnosis not present

## 2015-08-20 ENCOUNTER — Other Ambulatory Visit: Payer: Self-pay | Admitting: Neurosurgery

## 2015-08-20 DIAGNOSIS — M4807 Spinal stenosis, lumbosacral region: Secondary | ICD-10-CM

## 2015-08-27 ENCOUNTER — Other Ambulatory Visit: Payer: Self-pay | Admitting: Neurosurgery

## 2015-08-27 ENCOUNTER — Ambulatory Visit
Admission: RE | Admit: 2015-08-27 | Discharge: 2015-08-27 | Disposition: A | Discharge status: Home / Self Care | Payer: Medicare Other | Source: Ambulatory Visit | Attending: Neurosurgery | Admitting: Neurosurgery

## 2015-08-27 DIAGNOSIS — M4807 Spinal stenosis, lumbosacral region: Secondary | ICD-10-CM

## 2015-08-27 DIAGNOSIS — M47817 Spondylosis without myelopathy or radiculopathy, lumbosacral region: Secondary | ICD-10-CM | POA: Diagnosis not present

## 2015-08-27 MED ORDER — IOPAMIDOL (ISOVUE-M 200) INJECTION 41%
1.0000 mL | Freq: Once | INTRAMUSCULAR | Status: AC
  Administered 2015-08-27: 1 mL via EPIDURAL

## 2015-08-27 MED ORDER — METHYLPREDNISOLONE ACETATE 40 MG/ML INJ SUSP (RADIOLOG
120.0000 mg | Freq: Once | INTRAMUSCULAR | Status: AC
  Administered 2015-08-27: 120 mg via EPIDURAL

## 2015-08-27 NOTE — Discharge Instructions (Signed)

## 2015-08-30 ENCOUNTER — Other Ambulatory Visit: Payer: Medicare Other

## 2015-09-02 DIAGNOSIS — L570 Actinic keratosis: Secondary | ICD-10-CM | POA: Diagnosis not present

## 2015-09-02 DIAGNOSIS — L719 Rosacea, unspecified: Secondary | ICD-10-CM | POA: Diagnosis not present

## 2015-09-02 DIAGNOSIS — L820 Inflamed seborrheic keratosis: Secondary | ICD-10-CM | POA: Diagnosis not present

## 2015-09-02 DIAGNOSIS — L821 Other seborrheic keratosis: Secondary | ICD-10-CM | POA: Diagnosis not present

## 2015-10-15 DIAGNOSIS — E782 Mixed hyperlipidemia: Secondary | ICD-10-CM | POA: Diagnosis not present

## 2015-10-15 DIAGNOSIS — M8580 Other specified disorders of bone density and structure, unspecified site: Secondary | ICD-10-CM | POA: Diagnosis not present

## 2015-10-15 DIAGNOSIS — E039 Hypothyroidism, unspecified: Secondary | ICD-10-CM | POA: Diagnosis not present

## 2015-10-15 DIAGNOSIS — Z79899 Other long term (current) drug therapy: Secondary | ICD-10-CM | POA: Diagnosis not present

## 2015-10-15 DIAGNOSIS — I10 Essential (primary) hypertension: Secondary | ICD-10-CM | POA: Diagnosis not present

## 2015-10-15 DIAGNOSIS — G608 Other hereditary and idiopathic neuropathies: Secondary | ICD-10-CM | POA: Diagnosis not present

## 2015-10-15 DIAGNOSIS — Z0000 Encounter for general adult medical examination without abnormal findings: Secondary | ICD-10-CM | POA: Diagnosis not present

## 2015-11-02 DIAGNOSIS — L570 Actinic keratosis: Secondary | ICD-10-CM | POA: Diagnosis not present

## 2015-11-02 DIAGNOSIS — L820 Inflamed seborrheic keratosis: Secondary | ICD-10-CM | POA: Diagnosis not present

## 2015-11-02 DIAGNOSIS — L578 Other skin changes due to chronic exposure to nonionizing radiation: Secondary | ICD-10-CM | POA: Diagnosis not present

## 2015-11-08 DIAGNOSIS — G609 Hereditary and idiopathic neuropathy, unspecified: Secondary | ICD-10-CM | POA: Diagnosis not present

## 2015-11-08 DIAGNOSIS — G43709 Chronic migraine without aura, not intractable, without status migrainosus: Secondary | ICD-10-CM | POA: Diagnosis not present

## 2015-11-09 DIAGNOSIS — H26493 Other secondary cataract, bilateral: Secondary | ICD-10-CM | POA: Diagnosis not present

## 2015-11-25 DIAGNOSIS — G43709 Chronic migraine without aura, not intractable, without status migrainosus: Secondary | ICD-10-CM | POA: Diagnosis not present

## 2015-11-30 ENCOUNTER — Other Ambulatory Visit: Payer: Self-pay | Admitting: Neurosurgery

## 2015-11-30 DIAGNOSIS — M4807 Spinal stenosis, lumbosacral region: Secondary | ICD-10-CM

## 2015-12-07 ENCOUNTER — Other Ambulatory Visit: Payer: Self-pay | Admitting: Neurosurgery

## 2015-12-07 ENCOUNTER — Ambulatory Visit
Admission: RE | Admit: 2015-12-07 | Discharge: 2015-12-07 | Disposition: A | Discharge status: Home / Self Care | Payer: Medicare Other | Source: Ambulatory Visit | Attending: Neurosurgery | Admitting: Neurosurgery

## 2015-12-07 DIAGNOSIS — M4807 Spinal stenosis, lumbosacral region: Secondary | ICD-10-CM

## 2015-12-07 DIAGNOSIS — M545 Low back pain: Secondary | ICD-10-CM | POA: Diagnosis not present

## 2015-12-07 MED ORDER — IOPAMIDOL (ISOVUE-M 200) INJECTION 41%
1.0000 mL | Freq: Once | INTRAMUSCULAR | Status: AC
  Administered 2015-12-07: 1 mL via EPIDURAL

## 2015-12-07 MED ORDER — METHYLPREDNISOLONE ACETATE 40 MG/ML INJ SUSP (RADIOLOG
120.0000 mg | Freq: Once | INTRAMUSCULAR | Status: AC
  Administered 2015-12-07: 120 mg via EPIDURAL

## 2015-12-07 NOTE — Discharge Instructions (Signed)

## 2015-12-16 DIAGNOSIS — Z23 Encounter for immunization: Secondary | ICD-10-CM | POA: Diagnosis not present

## 2016-01-06 DIAGNOSIS — G609 Hereditary and idiopathic neuropathy, unspecified: Secondary | ICD-10-CM | POA: Diagnosis not present

## 2016-01-06 DIAGNOSIS — G43709 Chronic migraine without aura, not intractable, without status migrainosus: Secondary | ICD-10-CM | POA: Diagnosis not present

## 2016-03-09 DIAGNOSIS — G43709 Chronic migraine without aura, not intractable, without status migrainosus: Secondary | ICD-10-CM | POA: Diagnosis not present

## 2016-03-27 DIAGNOSIS — L578 Other skin changes due to chronic exposure to nonionizing radiation: Secondary | ICD-10-CM | POA: Diagnosis not present

## 2016-03-27 DIAGNOSIS — C44329 Squamous cell carcinoma of skin of other parts of face: Secondary | ICD-10-CM | POA: Diagnosis not present

## 2016-03-27 DIAGNOSIS — L570 Actinic keratosis: Secondary | ICD-10-CM | POA: Diagnosis not present

## 2016-03-27 DIAGNOSIS — L728 Other follicular cysts of the skin and subcutaneous tissue: Secondary | ICD-10-CM | POA: Diagnosis not present

## 2016-03-27 DIAGNOSIS — L821 Other seborrheic keratosis: Secondary | ICD-10-CM | POA: Diagnosis not present

## 2016-04-14 ENCOUNTER — Encounter: Payer: Self-pay | Admitting: Internal Medicine

## 2016-04-14 ENCOUNTER — Ambulatory Visit (INDEPENDENT_AMBULATORY_CARE_PROVIDER_SITE_OTHER): Payer: Medicare Other | Admitting: Internal Medicine

## 2016-04-14 VITALS — BP 98/62 | HR 76 | Ht 66.5 in | Wt 163.6 lb

## 2016-04-14 DIAGNOSIS — G43809 Other migraine, not intractable, without status migrainosus: Secondary | ICD-10-CM | POA: Diagnosis not present

## 2016-04-14 DIAGNOSIS — I10 Essential (primary) hypertension: Secondary | ICD-10-CM | POA: Diagnosis not present

## 2016-04-14 DIAGNOSIS — E785 Hyperlipidemia, unspecified: Secondary | ICD-10-CM

## 2016-04-14 DIAGNOSIS — Z79899 Other long term (current) drug therapy: Secondary | ICD-10-CM | POA: Diagnosis not present

## 2016-04-14 MED ORDER — CARVEDILOL 6.25 MG PO TABS: 6.2500 mg | ORAL_TABLET | Freq: Two times a day (BID) | ORAL | 3 refills | Status: DC

## 2016-04-14 NOTE — Patient Instructions (Addendum)
Your physician recommends that you return for lab work FASTING (nothing to [place] after midnight)  Your physician has recommended you make the following change in your medication...  1. STOP metoprolol succinate 2. START carvedilol 6.25mg  twice daily 3. Take irbesartan in the morning  Monitor home BP when you first get up in the morning and 1 hour before evening dose of carvedilol Please record your BP + HR   Your physician recommends that you schedule a follow-up appointment in: 3 weeks with clinical pharmacist for BP check -- bring home BP cuff & readings  Your physician wants you to follow-up in: ONE YEAR with Dr. Debara Pickett. You will receive a reminder letter in the mail two months in advance. If you don't receive a letter, please call our office to schedule the follow-up appointment.

## 2016-04-14 NOTE — Progress Notes (Signed)
OFFICE NOTE  Chief Complaint:  Occasional migraines, labile blood pressure  Primary Care Physician: Ronita Hipps, MD  HPI:  Bridget [name]  is a 76 year old female I have been seeing annually with a history of fatigue in the past. Actually when I saw her the last time she was short of breath only walking across the kitchen. We did undergo a significant workup which was basically normal. She returns today and says those symptoms have completely resolved and is unclear what they were due to. Unfortunately in February as you know she had a left hip replacement and is due for right knee replacement sometime in the near future. Denies any chest pain, worsening shortness of breath, palpitations, presyncope or syncopal symptoms.  Her main complaint is arthritis which is now developing in her left hip and knee. She's been hesitant to take meloxicam due to concerns about worsening heart disease. She reported she had cholesterol testing several months ago and is due for this again. The only other change is that she was noted to be taken off of amlodipine and switched to benazepril. She's not sure that that works as well for her blood pressure, but it does appear well-controlled today 122/82.  This was due to lower extremity swelling.  I saw Bridget [name] back in the office today. She's been having some problems with recurrent headaches. She's currently go to headache clinic and has been weaned off of all NSAIDs. She denies any chest pain or worsening shortness of breath. Her blood pressure control is been excellent. She's not had reassessment of her cholesterol to my knowledge since her last study in 2014.  Bridget [name] returns today for follow-up. She is being seen Dr. Joya Salm for back pain. She is on gabapentin 3 times daily but continues to have neuropathic symptoms. She also takes Percocet for pain. Blood pressure is well-controlled today. She denies any chest pain or worsening shortness of breath. EKG  shows normal sinus rhythm.  04/14/2016  Bridget [name] was seen today in annual follow-up. She has a number of concerns today including recurrent migraine headaches. She says they have improved somewhat with treatment by a neurologist however she continues to have problems with them. She also has labile blood pressures. When asked more questions about this it seems that she tends to adjust her medication doses based on what her blood pressure is. She may take her blood pressure for 5 times a day and if she feels like the blood pressures too low she may take less than the prescribed dose of medication or perhaps take additional medication if the blood pressure is too high. Given the long-term effects of these medicines which are both 16-24 hour half-lives, this strategy is probably causing her to have the high and low blood pressures that she seeing. Today her blood pressure was 98/62 and she is slightly presyncopal with this.  PMHx:  Past Medical History:  Diagnosis Date  . Arthritis    hip OA  . Degenerative disc disease   . GERD (gastroesophageal reflux disease)   . Hyperlipidemia   . Hypertension   . Hypothyroidism   . Migraines   . Neuropathy ([place])   . Seasonal allergies   . 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 CERVICAL DISCECTOMY  2005   with fusion  . CHOLECYSTECTOMY  2006  . LUMBAR LAMINECTOMY/DECOMPRESSION MICRODISCECTOMY Right 11/13/2014   Procedure: Right Lumbar four-five Microdiskectomy;  Surgeon: Leeroy Cha, MD;  Location: [facility] NEURO ORS;  Service: Neurosurgery;  Laterality: Right;  right  . 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 TOTAL ABDOMINAL HYSTERECTOMY  1978  . TOTAL HIP ARTHROPLASTY Left 04/2011  . TRANSTHORACIC ECHOCARDIOGRAM  12/2008   EF=>55%, borderline conc LVH; trace MR; mod TR; trace AV regurg    FAMHx:  Family History  Problem Relation Age of Onset  . Rectal cancer Neg Hx   . Stomach cancer  Neg Hx   . Colon cancer Cousin   . Esophageal cancer Cousin   . Stroke Mother 67  . Coronary artery disease Father 30  . Stroke Maternal Grandfather   . Stroke Paternal Grandmother   . Breast cancer Paternal Grandmother     SOCHx:   reports that she has never smoked. She has never used smokeless tobacco. She reports that she does not drink alcohol or use drugs.  ALLERGIES:  Allergies  Allergen Reactions  . Codeine Anaphylaxis  . Sulfonamide Derivatives Anaphylaxis  . Amlodipine Other (See Comments)    swelling    ROS: Pertinent items noted in HPI and remainder of comprehensive ROS otherwise negative.  HOME MEDS: Current Outpatient Prescriptions  Medication Sig Dispense Refill  . aspirin 81 MG tablet Take 81 mg by mouth daily.    . baclofen (LIORESAL) 10 MG tablet As needed    . gabapentin (NEURONTIN) 600 MG tablet Take 600 mg by mouth 3 (three) times daily.    Marland Kitchen imipramine (TOFRANIL) 25 MG tablet Take 25 mg by mouth at bedtime.  1  . irbesartan (AVAPRO) 75 MG tablet Take 75 mg by mouth daily.    Marland Kitchen levothyroxine (SYNTHROID, LEVOTHROID) 75 MCG tablet Take 75 mcg by mouth daily before breakfast.    . meloxicam (MOBIC) 7.5 MG tablet As needed up to bid    . Multiple Vitamin (MULTIVITAMIN) capsule Take 1 capsule by mouth daily.    Marland Kitchen oxyCODONE-acetaminophen (PERCOCET/ROXICET) 5-325 MG per tablet Take 1-2 tablets by mouth every 4 (four) hours as needed for moderate pain. 100 tablet 0  . rosuvastatin (CRESTOR) 10 MG tablet Take 1 tablet (10 mg total) by mouth daily. 28 tablet 0  . traMADol (ULTRAM) 50 MG tablet Patient takes 1-2 tablets by mouth every 4 hours as needed for migraine pain and neuropathy.    . carvedilol (COREG) 6.25 MG tablet Take 1 tablet (6.25 mg total) by mouth 2 (two) times daily. 180 tablet 3   No current facility-administered medications for this visit.     LABS/IMAGING: No results found for this or any previous visit (from the past 48 hour(s)). No results  found.  VITALS: BP 98/62   Pulse 76   Ht 5' 6.5" (1.689 m)   Wt 163 lb 9.6 oz (74.2 kg)   LMP  (LMP Unknown)   BMI 26.01 kg/m   EXAM: General appearance: alert and no distress Neck: no carotid bruit and no JVD Lungs: clear to auscultation bilaterally Heart: regular rate and rhythm, S1, S2 normal, no murmur, click, rub or gallop Abdomen: soft, non-tender; bowel sounds normal; no masses,  no organomegaly Extremities: extremities normal, atraumatic, no cyanosis or edema Pulses: 2+ and symmetric Skin: Skin color, texture, turgor normal. No rashes or lesions Neurologic: Grossly normal Psych: Mood, affect normal  EKG: Normal sinus rhythm at 76  ASSESSMENT: 1. Hypertension-well controlled 2. Dyslipidemia-on Crestor and Zetia 3. Arthritis 4. Headaches 5. LBP -neuropathy  PLAN: 1.   Ms. [name] is having some labile blood pressures  and is noted to be low today. This seems to be with medication adjustment. She has lost about 9 pounds since I last saw her, so her overall requirement for blood pressure medicine may not be as high. She says this is mostly due to vomiting related to pain with her headaches but she denies eating any less. I think she might benefit from addition of a more nonselective beta blocker which could give her some additional benefit for migraine headaches. The total dose of that should be lower than her current beta blocker dose his blood pressure does appear to be lower and weight is down about 10 pounds. I did advise starting carvedilol 6.25 mg twice daily and discontinue her current dose of Toprol. She should remain on irbesartan 75 mg every morning. Will arrange for follow-up in hypertension clinic in a few weeks to see if her blood pressures at home are responding appropriately. We'll also check a metabolic profile and lipid profile today as well. She remains on rosuvastatin and was well controlled previously but also took ezetimibe which she discontinued due to cost.  If her cholesterol is elevated I would advise restarting it.  Pixie Casino, MD, [facility] Attending Cardiologist [place] 04/14/2016, 1:45 PM

## 2016-04-15 LAB — COMPREHENSIVE METABOLIC PANEL
ALBUMIN: 3.9 g/dL (ref 3.6–5.1)
ALT: 14 U/L (ref 6–29)
AST: 21 U/L (ref 10–35)
Alkaline Phosphatase: 56 U/L (ref 33–130)
BUN: 21 mg/dL (ref 7–25)
CALCIUM: 9.7 mg/dL (ref 8.6–10.4)
CHLORIDE: 106 mmol/L (ref 98–110)
CO2: 25 mmol/L (ref 20–31)
CREATININE: 0.9 mg/dL (ref 0.60–0.93)
Glucose, Bld: 99 mg/dL (ref 65–99)
POTASSIUM: 4.4 mmol/L (ref 3.5–5.3)
Sodium: 143 mmol/L (ref 135–146)
TOTAL PROTEIN: 6.5 g/dL (ref 6.1–8.1)
Total Bilirubin: 0.4 mg/dL (ref 0.2–1.2)

## 2016-04-15 LAB — LIPID PANEL
CHOL/HDL RATIO: 2.5 ratio (ref <5.0)
CHOLESTEROL: 158 mg/dL (ref <200)
HDL: 63 mg/dL (ref >50)
LDL Cholesterol: 75 mg/dL (ref <100)
TRIGLYCERIDES: 98 mg/dL (ref <150)
VLDL: 20 mg/dL (ref <30)

## 2016-04-20 ENCOUNTER — Encounter: Payer: Self-pay | Admitting: Internal Medicine

## 2016-05-01 ENCOUNTER — Other Ambulatory Visit: Payer: Self-pay | Admitting: Neurosurgery

## 2016-05-01 DIAGNOSIS — M4807 Spinal stenosis, lumbosacral region: Secondary | ICD-10-CM

## 2016-05-03 DIAGNOSIS — Z1231 Encounter for screening mammogram for malignant neoplasm of breast: Secondary | ICD-10-CM | POA: Diagnosis not present

## 2016-05-03 DIAGNOSIS — Z01419 Encounter for gynecological examination (general) (routine) without abnormal findings: Secondary | ICD-10-CM | POA: Diagnosis not present

## 2016-05-03 DIAGNOSIS — Z6826 Body mass index (BMI) 26.0-26.9, adult: Secondary | ICD-10-CM | POA: Diagnosis not present

## 2016-05-05 ENCOUNTER — Ambulatory Visit: Payer: Medicare Other

## 2016-05-05 DIAGNOSIS — R922 Inconclusive mammogram: Secondary | ICD-10-CM | POA: Diagnosis not present

## 2016-05-10 ENCOUNTER — Ambulatory Visit: Payer: Medicare Other

## 2016-05-16 ENCOUNTER — Ambulatory Visit (INDEPENDENT_AMBULATORY_CARE_PROVIDER_SITE_OTHER): Payer: Medicare Other | Admitting: Pharmacist Clinician (PhC)/ Clinical Pharmacy Specialist

## 2016-05-16 DIAGNOSIS — I10 Essential (primary) hypertension: Secondary | ICD-10-CM | POA: Diagnosis not present

## 2016-05-16 NOTE — Patient Instructions (Signed)
  Your blood pressure today is 112/80 (goal is <130/80)  Check your blood pressure at home several times each week and keep record of the readings.  Take your BP meds as follows:  Continue with all your current medications.  Bring all of your meds, your BP cuff and your record of home blood pressures to your next appointment.  Exercise as you're able, try to walk approximately 30 minutes per day.  Keep salt intake to a minimum, especially watch canned and prepared boxed foods.  Eat more fresh fruits and vegetables and fewer canned items.  Avoid eating in fast food restaurants.    HOW TO TAKE YOUR BLOOD PRESSURE: . Rest 5 minutes before taking your blood pressure. .  Don't smoke or drink caffeinated beverages for at least 30 minutes before. . Take your blood pressure before (not after) you eat. . Sit comfortably with your back supported and both feet on the floor (don't cross your legs). . Elevate your arm to heart level on a table or a desk. . Use the proper sized cuff. It should fit smoothly and snugly around your bare upper arm. There should be enough room to slip a fingertip under the cuff. The bottom edge of the cuff should be 1 inch above the crease of the elbow. . Ideally, take 3 measurements at one sitting and record the average.

## 2016-05-16 NOTE — Progress Notes (Signed)
05/18/2016 Bridget [name] [birth year] [id number]   HPI:  Bridget [name] is a 76 y.o. female patient of Dr Rennis Golden, with a PMH below who presents today for hypertension clinic evaluation.  She has had hypertension for approximately 10 years, mostly well controlled.  Her bigger problem is chronic migraines, which she has had since she was about 76 years old.    In recent months she would adjust her blood pressure medications based on her home BP readings.  With higher readings she would take extra irbesartan and with lower readings would skip or take only 1/2 dose.  This was probably causing her to have greater fluctuations in her pressure.  Since her visit with Dr. Rennis Golden in January she has followed her dosing directions correctly, and has cut her BP measurements to no more than twice daily.  Dr. Rennis Golden recently switched her metoprolol succinate to carvedilol.  Blood Pressure Goal:  130/80   Current Medications:  Irbesartan 75 mg qd  Carvedilol 6.25 mg bid  Family Hx:  Both parents had hypertension, mother died from stroke at age 22; brother died at age 46 from accident  Social Hx:  No tobacco or alcohol, only drinks decaf coffees and teas  Diet:  Does eat out 2-3 times per week, but tries to follow a low sodium diet.  Prefers fresh over canned vegetables.  Exercise:  None currently  Home BP readings:  Has home ReliOn cuff (not made by [employer]) about 75 years old.  Home readings average 129/83  Intolerances:   Amlodipine causes edema  Wt Readings from Last 3 Encounters:  04/14/16 163 lb 9.6 oz (74.2 kg)  04/15/15 172 lb 3 oz (78.1 kg)  11/13/14 165 lb (74.8 kg)   BP Readings from Last 3 Encounters:  05/18/16 104/66  04/14/16 98/62  12/07/15 124/66   Pulse Readings from Last 3 Encounters:  05/18/16 80  04/14/16 76  12/07/15 68    Current Outpatient Prescriptions  Medication Sig Dispense Refill  . aspirin 81 MG tablet Take 81 mg by mouth daily.    . baclofen (LIORESAL)  10 MG tablet As needed    . carvedilol (COREG) 6.25 MG tablet Take 1 tablet (6.25 mg total) by mouth 2 (two) times daily. 180 tablet 3  . gabapentin (NEURONTIN) 600 MG tablet Take 600 mg by mouth 3 (three) times daily.    Marland Kitchen imipramine (TOFRANIL) 25 MG tablet Take 25 mg by mouth at bedtime.  1  . irbesartan (AVAPRO) 75 MG tablet Take 75 mg by mouth daily.    Marland Kitchen levothyroxine (SYNTHROID, LEVOTHROID) 75 MCG tablet Take 75 mcg by mouth daily before breakfast.    . Multiple Vitamin (MULTIVITAMIN) capsule Take 1 capsule by mouth daily.    . rosuvastatin (CRESTOR) 10 MG tablet Take 1 tablet (10 mg total) by mouth daily. 28 tablet 0  . traMADol (ULTRAM) 50 MG tablet Patient takes 1-2 tablets by mouth every 4 hours as needed for migraine pain and neuropathy.     No current facility-administered medications for this visit.     Allergies  Allergen Reactions  . Codeine Anaphylaxis  . Sulfonamide Derivatives Anaphylaxis  . Amlodipine Other (See Comments)    swelling    Past Medical History:  Diagnosis Date  . Arthritis    hip OA  . Degenerative disc disease   . GERD (gastroesophageal reflux disease)   . Hyperlipidemia   . Hypertension   . Hypothyroidism   . Migraines   .  Neuropathy (HCC)   . Seasonal allergies   . 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  Essential hypertension Patient with past history of non-compliance and self medication.  She is more stable now and admits to improving her compliance.  Home BP readings look good, and in office was well WNL.  She will continue with her current regimen and it was again stressed the importance of taking medications as prescribed.   Phillips Hay PharmD CPP [facility]

## 2016-05-18 ENCOUNTER — Encounter: Payer: Self-pay | Admitting: Pharmacist Clinician (PhC)/ Clinical Pharmacy Specialist

## 2016-05-18 NOTE — Assessment & Plan Note (Signed)
Patient with past history of non-compliance and self medication.  She is more stable now and admits to improving her compliance.  Home BP readings look good, and in office was well WNL.  She will continue with her current regimen and it was again stressed the importance of taking medications as prescribed.

## 2016-05-24 ENCOUNTER — Ambulatory Visit
Admission: RE | Admit: 2016-05-24 | Discharge: 2016-05-24 | Disposition: A | Discharge status: Home / Self Care | Payer: Medicare Other | Source: Ambulatory Visit | Attending: Neurosurgery | Admitting: Neurosurgery

## 2016-05-24 ENCOUNTER — Other Ambulatory Visit: Payer: Self-pay | Admitting: Neurosurgery

## 2016-05-24 DIAGNOSIS — M4807 Spinal stenosis, lumbosacral region: Secondary | ICD-10-CM

## 2016-05-24 DIAGNOSIS — M5416 Radiculopathy, lumbar region: Secondary | ICD-10-CM | POA: Diagnosis not present

## 2016-05-24 MED ORDER — METHYLPREDNISOLONE ACETATE 40 MG/ML INJ SUSP (RADIOLOG: 120.0000 mg | Freq: Once | INTRAMUSCULAR | Status: DC

## 2016-05-24 MED ORDER — IOPAMIDOL (ISOVUE-M 200) INJECTION 41%: 1.0000 mL | Freq: Once | INTRAMUSCULAR | Status: DC

## 2016-05-24 NOTE — Discharge Instructions (Signed)

## 2016-07-05 DIAGNOSIS — G43709 Chronic migraine without aura, not intractable, without status migrainosus: Secondary | ICD-10-CM | POA: Diagnosis not present

## 2016-07-06 DIAGNOSIS — R251 Tremor, unspecified: Secondary | ICD-10-CM | POA: Diagnosis not present

## 2016-07-06 DIAGNOSIS — G608 Other hereditary and idiopathic neuropathies: Secondary | ICD-10-CM | POA: Diagnosis not present

## 2016-07-06 DIAGNOSIS — E039 Hypothyroidism, unspecified: Secondary | ICD-10-CM | POA: Diagnosis not present

## 2016-07-06 DIAGNOSIS — Z6824 Body mass index (BMI) 24.0-24.9, adult: Secondary | ICD-10-CM | POA: Diagnosis not present

## 2016-07-10 ENCOUNTER — Encounter: Payer: Self-pay | Admitting: Neurology

## 2016-07-25 NOTE — Progress Notes (Signed)
Bridget [name] was seen today in the movement disorders clinic for neurologic consultation at the request of Ronita Hipps, MD.  This patient is accompanied in the office by her spouse who supplements the history. The consultation is for the evaluation of tremor and to r/o PD.   Pt denies this is reason for consult and states that she really doesn't have tremor.  States that biggest c/o is dizziness, falls and loss of balance.   States that falls started in 05/2016.  Had 4 falls since 05/2016.  With one she stepped into a hole outside and it was covered with leaves but she could get back up.  With one fall, she tripped over the threshold of the door and it was dark.  With one fall, she was going to the bathroom in the middle of the night and turned to fast.  She cannot remember the last fall.  She admits to numb/tingling feet and states that she has had neuropathy (unknown cause) for a "long time."  Not done balance therapy.  Admits to hx of migraine headache and has a neurology PA in [place].  Has had done botox and accupuncture for headache.   Tremor: No. but does c/o occasional "jerking" in the arms and legs Specific Symptoms: Family hx of similar:  No. (mom had neuropathy) Voice: yes x 6-9 months Sleep: sleeps well with gabapentin  Vivid Dreams:  Yes.    Acting out dreams:  No. Wet Pillows: No. Postural symptoms:  Yes.    Falls?  Yes.   Bradykinesia symptoms: shuffling gait and difficulty getting out of a chair Loss of smell:  Yes.   Loss of taste:  No. Urinary Incontinence:  Yes.  , minor, uses poise pad Difficulty Swallowing:  Yes.  , with dry foods and pills Handwriting, micrographia: mildly Trouble with ADL's:  No.  Trouble buttoning clothing: No. Depression:  No. Memory changes:  Yes.   per patient but husband states it is very good Hallucinations:  No.  visual distortions: No. N/V:  Yes.   with migraine only Lightheaded:  No. (rarely) - but at end of visit c/o "dizziness" -  not vertigo.  Has difficutly describing it and telling me if dizzy different than off balance.  Usually when turning fast or first arising.  Syncope: No. Diplopia:  No. Dyskinesia:  No.  Neuroimaging has previously been performed.  It is not available for my review today.    PREVIOUS MEDICATIONS: none to date  ALLERGIES:   Allergies  Allergen Reactions  . Codeine Anaphylaxis  . Sulfonamide Derivatives Anaphylaxis  . Amlodipine Other (See Comments)    swelling    CURRENT MEDICATIONS:  Outpatient Encounter Prescriptions as of 07/27/2016  Medication Sig  . aspirin 81 MG tablet Take 81 mg by mouth daily.  . baclofen (LIORESAL) 10 MG tablet As needed  . Calcium Carbonate-Vit D-Min (CALCIUM 1200 PO) Take by mouth.  . carvedilol (COREG CR) 20 MG 24 hr capsule Take 20 mg by mouth.  . co-enzyme Q-10 30 MG capsule Take 30 mg by mouth 3 (three) times daily.  Marland Kitchen gabapentin (NEURONTIN) 600 MG tablet Take 600 mg by mouth 3 (three) times daily.  Marland Kitchen imipramine (TOFRANIL) 25 MG tablet Take 25 mg by mouth at bedtime.  . irbesartan (AVAPRO) 75 MG tablet Take 75 mg by mouth daily.  Marland Kitchen levothyroxine (SYNTHROID, LEVOTHROID) 75 MCG tablet Take 75 mcg by mouth daily before breakfast.  . Multiple Vitamin (MULTIVITAMIN) capsule Take 1  capsule by mouth daily.  . Omega-3 Fatty Acids (FISH OIL) 1000 MG CAPS Take by mouth.  . rosuvastatin (CRESTOR) 10 MG tablet Take 1 tablet (10 mg total) by mouth daily.  . traMADol (ULTRAM) 50 MG tablet Patient takes 1-2 tablets by mouth every 4 hours as needed for migraine pain and neuropathy.   No facility-administered encounter medications on file as of 07/27/2016.     PAST MEDICAL HISTORY:   Past Medical History:  Diagnosis Date  . Arthritis    hip OA  . Degenerative disc disease   . GERD (gastroesophageal reflux disease)   . Hyperlipidemia   . Hypertension   . Hypothyroidism   . Migraines   . Neuropathy   . Seasonal allergies   . Skin cancer 2003   squamous  cell forehead    PAST SURGICAL HISTORY:   Past Surgical History:  Procedure Laterality Date  . CATARACT EXTRACTION, BILATERAL    . CERVICAL DISCECTOMY  2005   with fusion  . CHOLECYSTECTOMY  2006  . LUMBAR LAMINECTOMY/DECOMPRESSION MICRODISCECTOMY Right 11/13/2014   Procedure: Right Lumbar four-five Microdiskectomy;  Surgeon: Leeroy Cha, MD;  Location: [facility];  Service: Neurosurgery;  Laterality: Right;  right  . 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 TOTAL ABDOMINAL HYSTERECTOMY  1978  . TOTAL HIP ARTHROPLASTY Left 04/2011  . TRANSTHORACIC ECHOCARDIOGRAM  12/2008   EF=>55%, borderline conc LVH; trace MR; mod TR; trace AV regurg    SOCIAL HISTORY:   Social History   Social History  . Marital status: Married    Spouse name: N/A  . Number of children: 2  . Years of education: N/A   Occupational History  . retired     Presenter, broadcasting   Social History Main Topics  . Smoking status: Never Smoker  . Smokeless tobacco: Never Used  . Alcohol use No  . Drug use: No  . Sexual activity: Not on file   Other Topics Concern  . Not on file   Social History Narrative  . No narrative on file    FAMILY HISTORY:   Family Status  Relation Status  . Mother Deceased at age 61       CVA  . Father Deceased at age 75       CAD  . Cousin (Not Specified)  . Cousin (Not Specified)  . MGF (Not Specified)  . PGM (Not Specified)  . Brother Deceased       plane crash  . Daughter Alive  . Daughter Deceased  . Neg Hx (Not Specified)    ROS:  A complete 10 system review of systems was obtained and was unremarkable apart from what is mentioned above.  PHYSICAL EXAMINATION:    VITALS:   Vitals:   07/27/16 0827  BP: 118/62  Pulse: 86  SpO2: 92%  Weight: 156 lb (70.8 kg)  Height: 5\' 6"  (1.676 m)    GEN:  The patient appears stated age and is in NAD. HEENT:  Normocephalic, atraumatic.  The mucous membranes are moist. The superficial temporal arteries are without  ropiness or tenderness. CV:  RRR Lungs:  CTAB Neck/HEME:  There are no carotid bruits bilaterally.  Neurological examination:  Orientation: The patient is alert and oriented x3. Fund of knowledge is appropriate.  Recent and remote memory are intact.  Attention and concentration are normal.    Able to name objects and repeat phrases. Cranial nerves: There is good facial symmetry. Pupils are equal round and reactive to  light bilaterally. Fundoscopic exam reveals clear margins bilaterally. Extraocular muscles are intact. The visual fields are full to confrontational testing. The speech is fluent and clear. Soft palate rises symmetrically and there is no tongue deviation. Hearing is intact to conversational tone. Sensation: Sensation is intact to light and pinprick throughout (facial, trunk, extremities). Pinprick is only decreased in stocking distribution on the L.  Vibration is intact at the bilateral big toe but slightly decreased. There is no extinction with double simultaneous stimulation. There is no sensory dermatomal level identified. Motor: Strength is 5/5 in the bilateral upper and lower extremities.   Shoulder shrug is equal and symmetric.  There is no pronator drift. Deep tendon reflexes: Deep tendon reflexes are 2-2+/4 at the bilateral biceps, triceps, brachioradialis, patella and trace at the bilateral achilles. Plantar responses are downgoing bilaterally.  Movement examination: Tone: There is normal tone in the bilateral upper extremities.  The tone in the lower extremities is normal.  Abnormal movements: none Coordination:  There is no decremation with RAM's, with any form of RAMS, including alternating supination and pronation of the forearm, hand opening and closing, finger taps, heel taps and toe taps. Gait and Station: The patient has no difficulty arising out of a deep-seated chair without the use of the hands. The patient's stride length is slightly decreased.  She is antalgic with  her gait.  She leans to the right at the waist with ambulation.  No shuffling.  She cannot ambulate in a tandem fashion. MS:  The left leg is shorter than the right      Chemistry      Component Value Date/Time   NA 143 04/14/2016 1155   K 4.4 04/14/2016 1155   CL 106 04/14/2016 1155   CO2 25 04/14/2016 1155   BUN 21 04/14/2016 1155   CREATININE 0.90 04/14/2016 1155      Component Value Date/Time   CALCIUM 9.7 04/14/2016 1155   ALKPHOS 56 04/14/2016 1155   AST 21 04/14/2016 1155   ALT 14 04/14/2016 1155   BILITOT 0.4 04/14/2016 1155     Her primary care physician did then work on 07/06/2016.  Her TSH was 1.515.  Her B12 was 428.   ASSESSMENT/PLAN:  1.  Gait instability  -was previously dx with peripheral neuropathy.    -The patient has clinical examination evidence of a diffuse peripheral neuropathy but very mild clinical evidence, which certainly can affect gait and balance.  We discussed safety associated with peripheral neuropathy.  We discussed balance therapy and the importance of ambulatory assistive device for balance assistance.  She is willing to go to PT and will [facility]  -pt already seeing a neurology PA practice for this and will defer remainder of tx and work up to them  -I do think that structural issues play a role into balance as left leg appears shorter than the right.  Hopefully can give recommendations in PT (? Shoe insert).  May need ortho referral if doesn't help  -no evidence of PD and reassurance provided  2.  Migraine  -patient asks about "pain med" but has had multiple treatments and been seen at [facility].  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  Discussed with patient/husband that I often see this from gabapentin and she can discuss with prescribing provider  4.  Dizziness  -just started new BP  med and wonder if contributing.   Asked her to take BP at home when experiences the dizziness in various positions.  5.  f/u prn.  Much greater than 50% of this visit was spent in counseling and coordinating care.  Total face to face time:  60 min    Cc:  Ronita Hipps, MD

## 2016-07-27 ENCOUNTER — Ambulatory Visit (INDEPENDENT_AMBULATORY_CARE_PROVIDER_SITE_OTHER): Payer: Medicare Other | Admitting: Neurology

## 2016-07-27 ENCOUNTER — Encounter: Payer: Self-pay | Admitting: Neurology

## 2016-07-27 VITALS — BP 118/62 | HR 86 | Ht 66.0 in | Wt 156.0 lb

## 2016-07-27 DIAGNOSIS — R42 Dizziness and giddiness: Secondary | ICD-10-CM

## 2016-07-27 DIAGNOSIS — G253 Myoclonus: Secondary | ICD-10-CM

## 2016-07-27 DIAGNOSIS — R2681 Unsteadiness on feet: Secondary | ICD-10-CM | POA: Diagnosis not present

## 2016-07-27 DIAGNOSIS — G43719 Chronic migraine without aura, intractable, without status migrainosus: Secondary | ICD-10-CM | POA: Diagnosis not present

## 2016-08-09 ENCOUNTER — Telehealth: Payer: Self-pay | Admitting: Neurology

## 2016-08-09 NOTE — Telephone Encounter (Signed)
LMOM at [facility] checking on referral status. Will call patient when I hear back.

## 2016-08-09 NOTE — Telephone Encounter (Signed)
[facility] called back and states they don't have referral. Will send another referral to [facility]. Patient made aware.

## 2016-08-09 NOTE — Telephone Encounter (Signed)
Patient states that she was to be referred to [facility] therapy and has not heard anything please call her at [phone]

## 2016-08-18 DIAGNOSIS — M25551 Pain in right hip: Secondary | ICD-10-CM | POA: Diagnosis not present

## 2016-08-18 DIAGNOSIS — R2689 Other abnormalities of gait and mobility: Secondary | ICD-10-CM | POA: Diagnosis not present

## 2016-08-18 DIAGNOSIS — M6281 Muscle weakness (generalized): Secondary | ICD-10-CM | POA: Diagnosis not present

## 2016-08-22 DIAGNOSIS — M25551 Pain in right hip: Secondary | ICD-10-CM | POA: Diagnosis not present

## 2016-08-22 DIAGNOSIS — R2689 Other abnormalities of gait and mobility: Secondary | ICD-10-CM | POA: Diagnosis not present

## 2016-08-22 DIAGNOSIS — M6281 Muscle weakness (generalized): Secondary | ICD-10-CM | POA: Diagnosis not present

## 2016-08-24 DIAGNOSIS — R2689 Other abnormalities of gait and mobility: Secondary | ICD-10-CM | POA: Diagnosis not present

## 2016-08-24 DIAGNOSIS — M6281 Muscle weakness (generalized): Secondary | ICD-10-CM | POA: Diagnosis not present

## 2016-08-24 DIAGNOSIS — M25551 Pain in right hip: Secondary | ICD-10-CM | POA: Diagnosis not present

## 2016-08-28 DIAGNOSIS — M6281 Muscle weakness (generalized): Secondary | ICD-10-CM | POA: Diagnosis not present

## 2016-08-28 DIAGNOSIS — R2689 Other abnormalities of gait and mobility: Secondary | ICD-10-CM | POA: Diagnosis not present

## 2016-08-28 DIAGNOSIS — M25551 Pain in right hip: Secondary | ICD-10-CM | POA: Diagnosis not present

## 2016-09-07 DIAGNOSIS — R2689 Other abnormalities of gait and mobility: Secondary | ICD-10-CM | POA: Diagnosis not present

## 2016-09-07 DIAGNOSIS — M25551 Pain in right hip: Secondary | ICD-10-CM | POA: Diagnosis not present

## 2016-09-07 DIAGNOSIS — M6281 Muscle weakness (generalized): Secondary | ICD-10-CM | POA: Diagnosis not present

## 2016-09-13 DIAGNOSIS — M25551 Pain in right hip: Secondary | ICD-10-CM | POA: Diagnosis not present

## 2016-09-13 DIAGNOSIS — M6281 Muscle weakness (generalized): Secondary | ICD-10-CM | POA: Diagnosis not present

## 2016-09-13 DIAGNOSIS — R2689 Other abnormalities of gait and mobility: Secondary | ICD-10-CM | POA: Diagnosis not present

## 2016-09-18 DIAGNOSIS — R2689 Other abnormalities of gait and mobility: Secondary | ICD-10-CM | POA: Diagnosis not present

## 2016-09-18 DIAGNOSIS — M6281 Muscle weakness (generalized): Secondary | ICD-10-CM | POA: Diagnosis not present

## 2016-09-18 DIAGNOSIS — M25551 Pain in right hip: Secondary | ICD-10-CM | POA: Diagnosis not present

## 2016-09-26 DIAGNOSIS — M25551 Pain in right hip: Secondary | ICD-10-CM | POA: Diagnosis not present

## 2016-09-26 DIAGNOSIS — R2689 Other abnormalities of gait and mobility: Secondary | ICD-10-CM | POA: Diagnosis not present

## 2016-09-26 DIAGNOSIS — M6281 Muscle weakness (generalized): Secondary | ICD-10-CM | POA: Diagnosis not present

## 2016-10-05 DIAGNOSIS — L821 Other seborrheic keratosis: Secondary | ICD-10-CM | POA: Diagnosis not present

## 2016-10-05 DIAGNOSIS — L570 Actinic keratosis: Secondary | ICD-10-CM | POA: Diagnosis not present

## 2016-10-05 DIAGNOSIS — L578 Other skin changes due to chronic exposure to nonionizing radiation: Secondary | ICD-10-CM | POA: Diagnosis not present

## 2016-10-05 DIAGNOSIS — L281 Prurigo nodularis: Secondary | ICD-10-CM | POA: Diagnosis not present

## 2016-10-05 DIAGNOSIS — C44529 Squamous cell carcinoma of skin of other part of trunk: Secondary | ICD-10-CM | POA: Diagnosis not present

## 2016-10-16 DIAGNOSIS — L281 Prurigo nodularis: Secondary | ICD-10-CM | POA: Diagnosis not present

## 2016-11-08 DIAGNOSIS — H26493 Other secondary cataract, bilateral: Secondary | ICD-10-CM | POA: Diagnosis not present

## 2016-11-10 DIAGNOSIS — Z1389 Encounter for screening for other disorder: Secondary | ICD-10-CM | POA: Diagnosis not present

## 2016-11-10 DIAGNOSIS — Z0000 Encounter for general adult medical examination without abnormal findings: Secondary | ICD-10-CM | POA: Diagnosis not present

## 2016-11-10 DIAGNOSIS — E782 Mixed hyperlipidemia: Secondary | ICD-10-CM | POA: Diagnosis not present

## 2016-11-10 DIAGNOSIS — M549 Dorsalgia, unspecified: Secondary | ICD-10-CM | POA: Diagnosis not present

## 2016-11-10 DIAGNOSIS — R350 Frequency of micturition: Secondary | ICD-10-CM | POA: Diagnosis not present

## 2016-11-10 DIAGNOSIS — E039 Hypothyroidism, unspecified: Secondary | ICD-10-CM | POA: Diagnosis not present

## 2016-11-10 DIAGNOSIS — Z79899 Other long term (current) drug therapy: Secondary | ICD-10-CM | POA: Diagnosis not present

## 2017-01-04 DIAGNOSIS — Z23 Encounter for immunization: Secondary | ICD-10-CM | POA: Diagnosis not present

## 2017-03-21 DIAGNOSIS — L570 Actinic keratosis: Secondary | ICD-10-CM | POA: Diagnosis not present

## 2017-03-21 DIAGNOSIS — L719 Rosacea, unspecified: Secondary | ICD-10-CM | POA: Diagnosis not present

## 2017-03-21 DIAGNOSIS — L821 Other seborrheic keratosis: Secondary | ICD-10-CM | POA: Diagnosis not present

## 2017-03-21 DIAGNOSIS — L820 Inflamed seborrheic keratosis: Secondary | ICD-10-CM | POA: Diagnosis not present

## 2017-03-21 DIAGNOSIS — L281 Prurigo nodularis: Secondary | ICD-10-CM | POA: Diagnosis not present

## 2017-05-01 ENCOUNTER — Encounter: Payer: Self-pay | Admitting: Internal Medicine

## 2017-05-01 ENCOUNTER — Ambulatory Visit (INDEPENDENT_AMBULATORY_CARE_PROVIDER_SITE_OTHER): Payer: Medicare Other | Admitting: Internal Medicine

## 2017-05-01 VITALS — BP 122/78 | HR 78 | Ht 66.5 in | Wt 153.6 lb

## 2017-05-01 DIAGNOSIS — I10 Essential (primary) hypertension: Secondary | ICD-10-CM

## 2017-05-01 DIAGNOSIS — E785 Hyperlipidemia, unspecified: Secondary | ICD-10-CM | POA: Diagnosis not present

## 2017-05-01 NOTE — Patient Instructions (Signed)
Your physician wants you to follow-up in: ONE YEAR with Dr. Hilty. You will receive a reminder letter in the mail two months in advance. If you don't receive a letter, please call our office to schedule the follow-up appointment.  

## 2017-05-01 NOTE — Progress Notes (Signed)
OFFICE NOTE  Chief Complaint:  No complaints  Primary Care Physician: Ronita Hipps, MD  HPI:  Bridget [name]  is a 77 year old female I have been seeing annually with a history of fatigue in the past. Actually when I saw her the last time she was short of breath only walking across the kitchen. We did undergo a significant workup which was basically normal. She returns [name] and says those symptoms have completely resolved and is unclear what they were due to. Unfortunately in February as you know she had a left hip replacement and is due for right knee replacement sometime in the near future. Denies any chest pain, worsening shortness of breath, palpitations, presyncope or syncopal symptoms.  Her main complaint is arthritis which is now developing in her left hip and knee. She's been hesitant to take meloxicam due to concerns about worsening heart disease. She reported she had cholesterol testing several months ago and is due for this again. The only other change is that she was noted to be taken off of amlodipine and switched to benazepril. She's not sure that that works as well for her blood pressure, but it does appear well-controlled [name] 122/82.  This was due to lower extremity swelling.  I saw Bridget [name] back in the office [name]. She's been having some problems with recurrent headaches. She's currently go to headache clinic and has been weaned off of all NSAIDs. She denies any chest pain or worsening shortness of breath. Her blood pressure control is been excellent. She's not had reassessment of her cholesterol to my knowledge since her last study in 2014.  Bridget [name] returns [name] for follow-up. She is being seen Dr. Joya Salm for back pain. She is on gabapentin 3 times daily but continues to have neuropathic symptoms. She also takes Percocet for pain. Blood pressure is well-controlled [name]. She denies any chest pain or worsening shortness of breath. EKG shows normal sinus  rhythm.  04/14/2016  Bridget [name] was seen [name] in annual follow-up. She has a number of concerns [name] including recurrent migraine headaches. She says they have improved somewhat with treatment by a neurologist however she continues to have problems with them. She also has labile blood pressures. When asked more questions about this it seems that she tends to adjust her medication doses based on what her blood pressure is. She may take her blood pressure for 5 times a day and if she feels like the blood pressures too low she may take less than the prescribed dose of medication or perhaps take additional medication if the blood pressure is too high. Given the long-term effects of these medicines which are both 16-24 hour half-lives, this strategy is probably causing her to have the high and low blood pressures that she seeing. [name] her blood pressure was 98/62 and she is slightly presyncopal with this.  05/01/2017  Bridget [name] for follow-up.  Overall she is doing well.  Her blood pressure is excellent [name] at 122/78.  Since she fell straight of the twice daily carvedilol and the dose was reduced and placed on long-acting carvedilol 20 mg daily.  She remains on irbesartan.  She denies any chest pain or palpitations.  Cholesterol was assessed last summer showed total cholesterol 158, HDL 48, LDL 77 and triglycerides 167.  PMHx:  Past Medical History:  Diagnosis Date  . Arthritis    hip OA  . Degenerative disc disease   . GERD (gastroesophageal reflux disease)   .  Hyperlipidemia   . Hypertension   . Hypothyroidism   . Migraines   . Neuropathy   . Seasonal allergies   . 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 CATARACT EXTRACTION, BILATERAL    . CERVICAL DISCECTOMY  2005   with fusion  . CHOLECYSTECTOMY  2006  . LUMBAR LAMINECTOMY/DECOMPRESSION MICRODISCECTOMY Right 11/13/2014   Procedure: Right Lumbar four-five  Microdiskectomy;  Surgeon: Leeroy Cha, MD;  Location: [place] [facility];  Service: Neurosurgery;  Laterality: Right;  right  . 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 TOTAL ABDOMINAL HYSTERECTOMY  1978  . TOTAL HIP ARTHROPLASTY Left 04/2011  . TRANSTHORACIC ECHOCARDIOGRAM  12/2008   EF=>55%, borderline conc LVH; trace MR; mod TR; trace AV regurg    FAMHx:  Family History  Problem Relation Age of Onset  . Stroke Mother 14  . Coronary artery disease Father 46  . Colon cancer Cousin   . Esophageal cancer Cousin   . Stroke Maternal Grandfather   . Stroke Paternal Grandmother   . Breast cancer Paternal Grandmother   . Suicidality Daughter   . Rectal cancer Neg Hx   . Stomach cancer Neg Hx     SOCHx:   reports that  has never smoked. she has never used smokeless tobacco. She reports that she does not drink alcohol or use drugs.  ALLERGIES:  Allergies  Allergen Reactions  . Codeine Anaphylaxis  . Sulfonamide Derivatives Anaphylaxis  . Amlodipine Other (See Comments)    swelling    ROS: Pertinent items noted in HPI and remainder of comprehensive ROS otherwise negative.  HOME MEDS: Current Outpatient Medications  Medication Sig Dispense Refill  . aspirin 81 MG tablet Take 81 mg by mouth daily.    . baclofen (LIORESAL) 10 MG tablet As needed    . carvedilol (COREG CR) 20 MG 24 hr capsule Take 20 mg by mouth.    Eduard Roux (AIMOVIG [place]) Inject into the skin.    Marland Kitchen gabapentin (NEURONTIN) 600 MG tablet Take 600 mg by mouth 3 (three) times daily.    Marland Kitchen imipramine (TOFRANIL) 25 MG tablet Take 25 mg by mouth at bedtime.  1  . irbesartan (AVAPRO) 75 MG tablet Take 75 mg by mouth daily.    Marland Kitchen levothyroxine (SYNTHROID, LEVOTHROID) 75 MCG tablet Take 75 mcg by mouth daily before breakfast.    . Multiple Vitamin (MULTIVITAMIN) capsule Take 1 capsule by mouth daily.    . Omega-3 Fatty Acids (FISH OIL) 1000 MG CAPS Take by mouth.    . rosuvastatin (CRESTOR) 10 MG tablet Take 1 tablet (10 mg  total) by mouth daily. 28 tablet 0  . traMADol (ULTRAM) 50 MG tablet Patient takes 1-2 tablets by mouth every 4 hours as needed for migraine pain and neuropathy.     No current facility-administered medications for this visit.     LABS/IMAGING: No results found for this or any previous visit (from the past 48 hour(s)). No results found.  VITALS: BP 122/78   Pulse 78   Ht 5' 6.5" (1.689 m)   Wt 153 lb 9.6 oz (69.7 kg)   LMP  (LMP Unknown)   BMI 24.42 kg/m   EXAM: General appearance: alert and no distress Neck: no carotid bruit and no JVD Lungs: clear to auscultation bilaterally Heart: regular rate and rhythm, S1, S2 normal, no murmur, click, rub or gallop Abdomen: soft, non-tender; bowel sounds normal; no masses,  no  organomegaly Extremities: extremities normal, atraumatic, no cyanosis or edema Pulses: 2+ and symmetric Skin: Skin color, texture, turgor normal. No rashes or lesions Neurologic: Grossly normal Psych: Mood, affect normal  EKG: Normal sinus rhythm 78-personally reviewed  ASSESSMENT: 1. Hypertension-well controlled 2. Dyslipidemia-on Crestor 3. Arthritis 4. Headaches 5. LBP -neuropathy  PLAN: 1.   Ms. [name] is doing well and is asymptomatic.  Blood pressure is at goal.  Her cholesterol is at goal.  She stopped taking ezetimibe due to cost however her LDL is now close to 70.  I would not advise restarting it.  Plan follow-up annually or sooner as necessary.  Pixie Casino, MD, [facility] [place] Director of the [facility] Lipid Disorders &  Cardiovascular Risk Reduction Clinic Diplomate of the American Board of Clinical Lipidology Attending Cardiologist  Direct Dial: [phone]  Fax: [phone]  Website:  [name] 05/01/2017, 3:17 PM

## 2017-05-07 DIAGNOSIS — Z6824 Body mass index (BMI) 24.0-24.9, adult: Secondary | ICD-10-CM | POA: Diagnosis not present

## 2017-05-07 DIAGNOSIS — Z1272 Encounter for screening for malignant neoplasm of vagina: Secondary | ICD-10-CM | POA: Diagnosis not present

## 2017-05-07 DIAGNOSIS — Z1231 Encounter for screening mammogram for malignant neoplasm of breast: Secondary | ICD-10-CM | POA: Diagnosis not present

## 2017-05-07 DIAGNOSIS — Z124 Encounter for screening for malignant neoplasm of cervix: Secondary | ICD-10-CM | POA: Diagnosis not present

## 2017-05-10 DIAGNOSIS — R921 Mammographic calcification found on diagnostic imaging of breast: Secondary | ICD-10-CM | POA: Diagnosis not present

## 2017-06-19 DIAGNOSIS — L570 Actinic keratosis: Secondary | ICD-10-CM | POA: Diagnosis not present

## 2017-06-19 DIAGNOSIS — L719 Rosacea, unspecified: Secondary | ICD-10-CM | POA: Diagnosis not present

## 2017-07-05 ENCOUNTER — Other Ambulatory Visit: Payer: Self-pay | Admitting: Internal Medicine

## 2017-07-05 IMAGING — CR DG LUMBAR SPINE 2-3V
2 series · 2 of 2 positions shown · non-contrast
Comparison: 11/12/2014.

CLINICAL DATA: Surgery.

EXAM:
LUMBAR SPINE - 2-3 VIEW

[lat (1 of 2)]
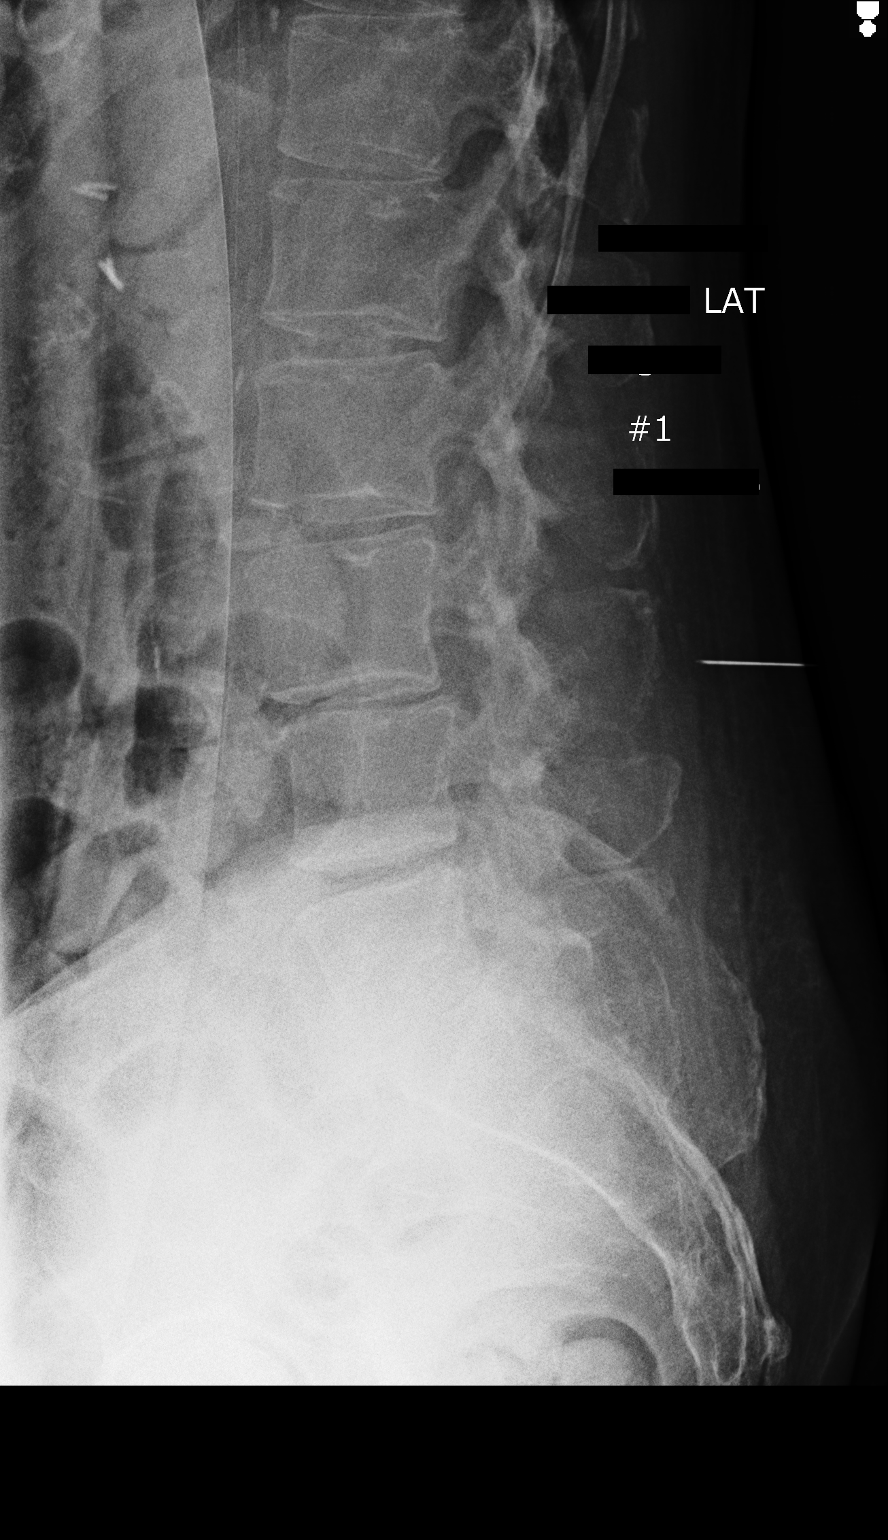

[lat (2 of 2)]
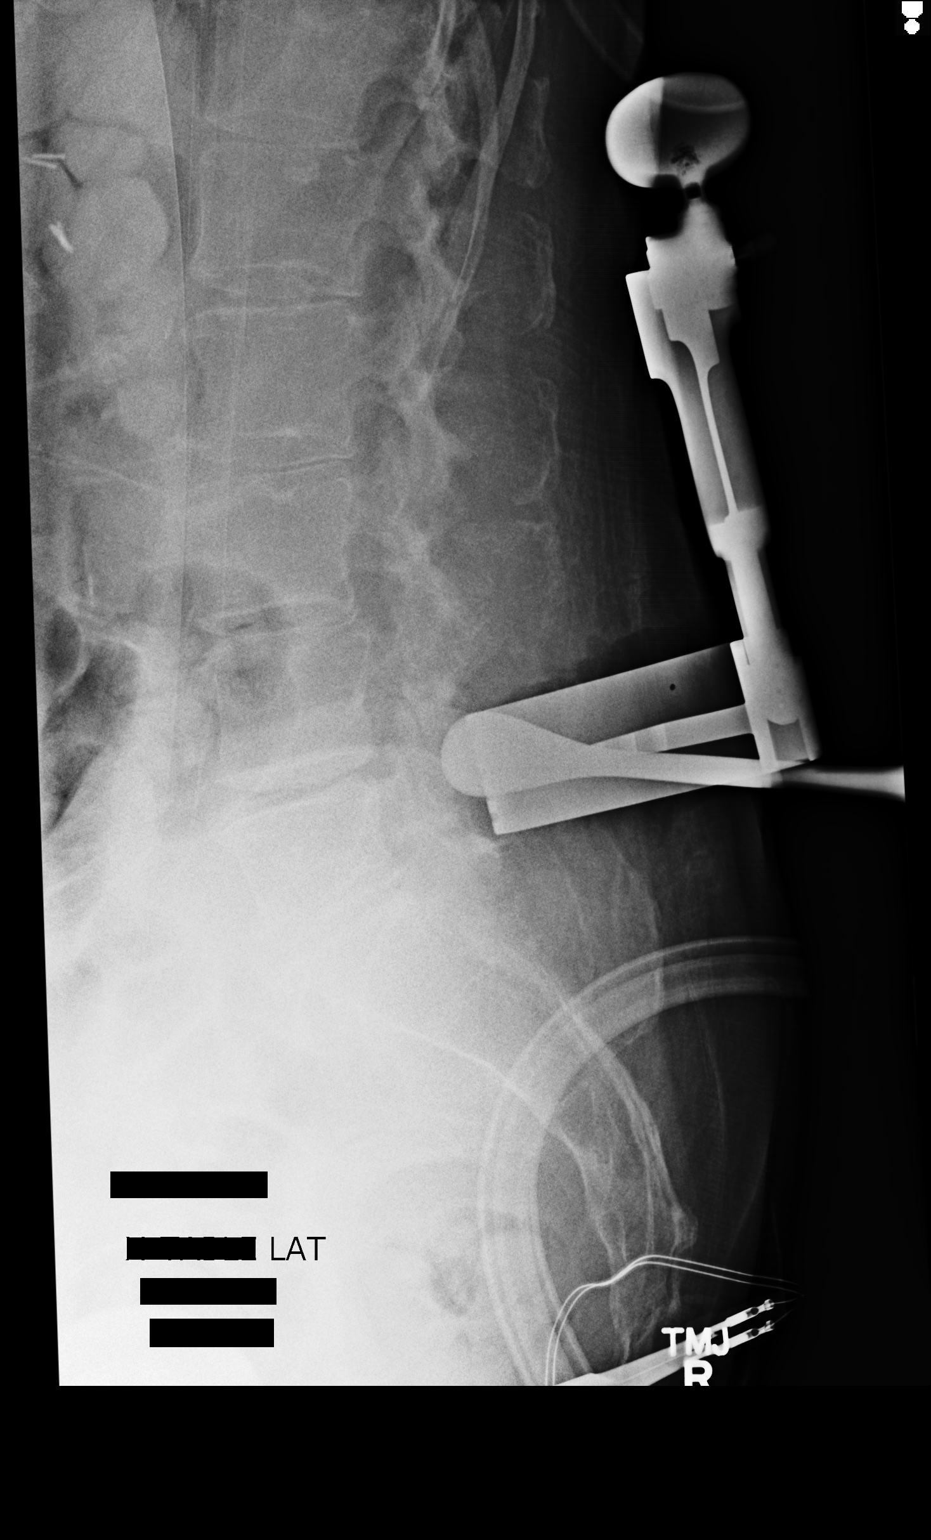

[2 of 2 positions shown; findings below may reference images not displayed]

FINDINGS: Lumbar vertebra are numbered with the lowest segmented lumbar shaped
vertebra as L5. Metallic marker is noted posteriorly at the L3-L4
level on the first image. On the second image metallic instruments
are noted at the L4-L5 level.
IMPRESSION: Lumbar spine intraoperative findings as above.

## 2017-10-17 DIAGNOSIS — L281 Prurigo nodularis: Secondary | ICD-10-CM | POA: Diagnosis not present

## 2017-10-17 DIAGNOSIS — L821 Other seborrheic keratosis: Secondary | ICD-10-CM | POA: Diagnosis not present

## 2017-10-17 DIAGNOSIS — B351 Tinea unguium: Secondary | ICD-10-CM | POA: Diagnosis not present

## 2017-10-17 DIAGNOSIS — L578 Other skin changes due to chronic exposure to nonionizing radiation: Secondary | ICD-10-CM | POA: Diagnosis not present

## 2017-10-25 ENCOUNTER — Encounter: Payer: Self-pay | Admitting: Gastroenterology

## 2017-11-13 DIAGNOSIS — M5416 Radiculopathy, lumbar region: Secondary | ICD-10-CM | POA: Diagnosis not present

## 2017-11-13 DIAGNOSIS — G43709 Chronic migraine without aura, not intractable, without status migrainosus: Secondary | ICD-10-CM | POA: Diagnosis not present

## 2017-11-13 DIAGNOSIS — R269 Unspecified abnormalities of gait and mobility: Secondary | ICD-10-CM | POA: Diagnosis not present

## 2017-11-13 DIAGNOSIS — G629 Polyneuropathy, unspecified: Secondary | ICD-10-CM | POA: Diagnosis not present

## 2017-11-13 DIAGNOSIS — H26493 Other secondary cataract, bilateral: Secondary | ICD-10-CM | POA: Diagnosis not present

## 2017-11-16 ENCOUNTER — Other Ambulatory Visit: Payer: Self-pay | Admitting: Internal Medicine

## 2017-11-16 NOTE — Telephone Encounter (Signed)
Rx sent to pharmacy   

## 2017-11-22 DIAGNOSIS — R2689 Other abnormalities of gait and mobility: Secondary | ICD-10-CM | POA: Diagnosis not present

## 2017-11-22 DIAGNOSIS — R269 Unspecified abnormalities of gait and mobility: Secondary | ICD-10-CM | POA: Diagnosis not present

## 2017-11-22 DIAGNOSIS — M5416 Radiculopathy, lumbar region: Secondary | ICD-10-CM | POA: Diagnosis not present

## 2017-11-26 DIAGNOSIS — M5416 Radiculopathy, lumbar region: Secondary | ICD-10-CM | POA: Diagnosis not present

## 2017-11-26 DIAGNOSIS — R269 Unspecified abnormalities of gait and mobility: Secondary | ICD-10-CM | POA: Diagnosis not present

## 2017-11-26 DIAGNOSIS — R2689 Other abnormalities of gait and mobility: Secondary | ICD-10-CM | POA: Diagnosis not present

## 2017-11-27 DIAGNOSIS — B351 Tinea unguium: Secondary | ICD-10-CM | POA: Diagnosis not present

## 2017-11-27 DIAGNOSIS — L281 Prurigo nodularis: Secondary | ICD-10-CM | POA: Diagnosis not present

## 2017-11-28 DIAGNOSIS — R269 Unspecified abnormalities of gait and mobility: Secondary | ICD-10-CM | POA: Diagnosis not present

## 2017-11-28 DIAGNOSIS — M5416 Radiculopathy, lumbar region: Secondary | ICD-10-CM | POA: Diagnosis not present

## 2017-11-28 DIAGNOSIS — R2689 Other abnormalities of gait and mobility: Secondary | ICD-10-CM | POA: Diagnosis not present

## 2017-12-03 DIAGNOSIS — M5416 Radiculopathy, lumbar region: Secondary | ICD-10-CM | POA: Diagnosis not present

## 2017-12-03 DIAGNOSIS — R2689 Other abnormalities of gait and mobility: Secondary | ICD-10-CM | POA: Diagnosis not present

## 2017-12-03 DIAGNOSIS — R269 Unspecified abnormalities of gait and mobility: Secondary | ICD-10-CM | POA: Diagnosis not present

## 2017-12-06 DIAGNOSIS — E039 Hypothyroidism, unspecified: Secondary | ICD-10-CM | POA: Diagnosis not present

## 2017-12-06 DIAGNOSIS — Z0000 Encounter for general adult medical examination without abnormal findings: Secondary | ICD-10-CM | POA: Diagnosis not present

## 2017-12-06 DIAGNOSIS — R2689 Other abnormalities of gait and mobility: Secondary | ICD-10-CM | POA: Diagnosis not present

## 2017-12-06 DIAGNOSIS — E782 Mixed hyperlipidemia: Secondary | ICD-10-CM | POA: Diagnosis not present

## 2017-12-06 DIAGNOSIS — M5416 Radiculopathy, lumbar region: Secondary | ICD-10-CM | POA: Diagnosis not present

## 2017-12-06 DIAGNOSIS — Z23 Encounter for immunization: Secondary | ICD-10-CM | POA: Diagnosis not present

## 2017-12-06 DIAGNOSIS — R269 Unspecified abnormalities of gait and mobility: Secondary | ICD-10-CM | POA: Diagnosis not present

## 2017-12-06 DIAGNOSIS — I10 Essential (primary) hypertension: Secondary | ICD-10-CM | POA: Diagnosis not present

## 2017-12-06 DIAGNOSIS — G608 Other hereditary and idiopathic neuropathies: Secondary | ICD-10-CM | POA: Diagnosis not present

## 2017-12-06 DIAGNOSIS — Z1331 Encounter for screening for depression: Secondary | ICD-10-CM | POA: Diagnosis not present

## 2017-12-10 DIAGNOSIS — M5416 Radiculopathy, lumbar region: Secondary | ICD-10-CM | POA: Diagnosis not present

## 2017-12-10 DIAGNOSIS — R2689 Other abnormalities of gait and mobility: Secondary | ICD-10-CM | POA: Diagnosis not present

## 2017-12-10 DIAGNOSIS — R269 Unspecified abnormalities of gait and mobility: Secondary | ICD-10-CM | POA: Diagnosis not present

## 2017-12-12 DIAGNOSIS — M5416 Radiculopathy, lumbar region: Secondary | ICD-10-CM | POA: Diagnosis not present

## 2017-12-12 DIAGNOSIS — R2689 Other abnormalities of gait and mobility: Secondary | ICD-10-CM | POA: Diagnosis not present

## 2017-12-12 DIAGNOSIS — R269 Unspecified abnormalities of gait and mobility: Secondary | ICD-10-CM | POA: Diagnosis not present

## 2017-12-17 DIAGNOSIS — M5416 Radiculopathy, lumbar region: Secondary | ICD-10-CM | POA: Diagnosis not present

## 2017-12-17 DIAGNOSIS — R269 Unspecified abnormalities of gait and mobility: Secondary | ICD-10-CM | POA: Diagnosis not present

## 2017-12-17 DIAGNOSIS — R2689 Other abnormalities of gait and mobility: Secondary | ICD-10-CM | POA: Diagnosis not present

## 2017-12-19 DIAGNOSIS — R2689 Other abnormalities of gait and mobility: Secondary | ICD-10-CM | POA: Diagnosis not present

## 2017-12-19 DIAGNOSIS — M5416 Radiculopathy, lumbar region: Secondary | ICD-10-CM | POA: Diagnosis not present

## 2017-12-19 DIAGNOSIS — R269 Unspecified abnormalities of gait and mobility: Secondary | ICD-10-CM | POA: Diagnosis not present

## 2017-12-24 DIAGNOSIS — R269 Unspecified abnormalities of gait and mobility: Secondary | ICD-10-CM | POA: Diagnosis not present

## 2017-12-24 DIAGNOSIS — R2689 Other abnormalities of gait and mobility: Secondary | ICD-10-CM | POA: Diagnosis not present

## 2017-12-24 DIAGNOSIS — M5416 Radiculopathy, lumbar region: Secondary | ICD-10-CM | POA: Diagnosis not present

## 2017-12-26 DIAGNOSIS — R2689 Other abnormalities of gait and mobility: Secondary | ICD-10-CM | POA: Diagnosis not present

## 2017-12-26 DIAGNOSIS — R269 Unspecified abnormalities of gait and mobility: Secondary | ICD-10-CM | POA: Diagnosis not present

## 2017-12-26 DIAGNOSIS — M5416 Radiculopathy, lumbar region: Secondary | ICD-10-CM | POA: Diagnosis not present

## 2017-12-31 DIAGNOSIS — R269 Unspecified abnormalities of gait and mobility: Secondary | ICD-10-CM | POA: Diagnosis not present

## 2017-12-31 DIAGNOSIS — M5416 Radiculopathy, lumbar region: Secondary | ICD-10-CM | POA: Diagnosis not present

## 2017-12-31 DIAGNOSIS — R2689 Other abnormalities of gait and mobility: Secondary | ICD-10-CM | POA: Diagnosis not present

## 2018-01-02 DIAGNOSIS — R269 Unspecified abnormalities of gait and mobility: Secondary | ICD-10-CM | POA: Diagnosis not present

## 2018-01-02 DIAGNOSIS — M5416 Radiculopathy, lumbar region: Secondary | ICD-10-CM | POA: Diagnosis not present

## 2018-01-02 DIAGNOSIS — R2689 Other abnormalities of gait and mobility: Secondary | ICD-10-CM | POA: Diagnosis not present

## 2018-01-07 DIAGNOSIS — R269 Unspecified abnormalities of gait and mobility: Secondary | ICD-10-CM | POA: Diagnosis not present

## 2018-01-07 DIAGNOSIS — M5416 Radiculopathy, lumbar region: Secondary | ICD-10-CM | POA: Diagnosis not present

## 2018-01-07 DIAGNOSIS — R2689 Other abnormalities of gait and mobility: Secondary | ICD-10-CM | POA: Diagnosis not present

## 2018-01-15 DIAGNOSIS — M5416 Radiculopathy, lumbar region: Secondary | ICD-10-CM | POA: Diagnosis not present

## 2018-01-15 DIAGNOSIS — R2689 Other abnormalities of gait and mobility: Secondary | ICD-10-CM | POA: Diagnosis not present

## 2018-01-15 DIAGNOSIS — R269 Unspecified abnormalities of gait and mobility: Secondary | ICD-10-CM | POA: Diagnosis not present

## 2018-01-17 DIAGNOSIS — R2689 Other abnormalities of gait and mobility: Secondary | ICD-10-CM | POA: Diagnosis not present

## 2018-01-17 DIAGNOSIS — R269 Unspecified abnormalities of gait and mobility: Secondary | ICD-10-CM | POA: Diagnosis not present

## 2018-01-17 DIAGNOSIS — M5416 Radiculopathy, lumbar region: Secondary | ICD-10-CM | POA: Diagnosis not present

## 2018-01-22 DIAGNOSIS — R269 Unspecified abnormalities of gait and mobility: Secondary | ICD-10-CM | POA: Diagnosis not present

## 2018-01-22 DIAGNOSIS — R2689 Other abnormalities of gait and mobility: Secondary | ICD-10-CM | POA: Diagnosis not present

## 2018-01-22 DIAGNOSIS — M5416 Radiculopathy, lumbar region: Secondary | ICD-10-CM | POA: Diagnosis not present

## 2018-01-25 ENCOUNTER — Encounter: Payer: Self-pay | Admitting: Gastroenterology

## 2018-01-29 DIAGNOSIS — R269 Unspecified abnormalities of gait and mobility: Secondary | ICD-10-CM | POA: Diagnosis not present

## 2018-01-29 DIAGNOSIS — M5416 Radiculopathy, lumbar region: Secondary | ICD-10-CM | POA: Diagnosis not present

## 2018-01-29 DIAGNOSIS — R2689 Other abnormalities of gait and mobility: Secondary | ICD-10-CM | POA: Diagnosis not present

## 2018-02-05 ENCOUNTER — Ambulatory Visit (AMBULATORY_SURGERY_CENTER): Payer: Self-pay | Admitting: *Deleted

## 2018-02-05 VITALS — Ht 66.0 in | Wt 158.4 lb

## 2018-02-05 DIAGNOSIS — Z86010 Personal history of colonic polyps: Secondary | ICD-10-CM

## 2018-02-05 NOTE — Progress Notes (Signed)
No egg or soy allergy known to patient  No issues with past sedation with any surgeries  or procedures, no intubation problems  No diet pills per patient No home 02 use per patient  No blood thinners per patient  Pt denies issues with constipation  No A fib or A flutter  EMMI video sent to pt's e mail - pt declined  Dr Loletha Carrow said ok to use plenvu as Movi is 126$ with pt's insurance Plenvu sample-- [phone]  Exp 09/2018 sample as directed   Husband in [place] with pt today

## 2018-02-12 DIAGNOSIS — R2689 Other abnormalities of gait and mobility: Secondary | ICD-10-CM | POA: Diagnosis not present

## 2018-02-12 DIAGNOSIS — R269 Unspecified abnormalities of gait and mobility: Secondary | ICD-10-CM | POA: Diagnosis not present

## 2018-02-12 DIAGNOSIS — M5416 Radiculopathy, lumbar region: Secondary | ICD-10-CM | POA: Diagnosis not present

## 2018-02-21 ENCOUNTER — Ambulatory Visit (AMBULATORY_SURGERY_CENTER): Payer: Medicare Other | Admitting: Gastroenterology

## 2018-02-21 ENCOUNTER — Encounter: Payer: Self-pay | Admitting: Gastroenterology

## 2018-02-21 VITALS — BP 164/81 | HR 80 | Temp 96.0°F | Resp 12 | Ht 66.0 in | Wt 158.0 lb

## 2018-02-21 DIAGNOSIS — D123 Benign neoplasm of transverse colon: Secondary | ICD-10-CM | POA: Diagnosis not present

## 2018-02-21 DIAGNOSIS — D122 Benign neoplasm of ascending colon: Secondary | ICD-10-CM | POA: Diagnosis not present

## 2018-02-21 DIAGNOSIS — Z1211 Encounter for screening for malignant neoplasm of colon: Secondary | ICD-10-CM | POA: Diagnosis not present

## 2018-02-21 DIAGNOSIS — Z86010 Personal history of colonic polyps: Secondary | ICD-10-CM

## 2018-02-21 DIAGNOSIS — D120 Benign neoplasm of cecum: Secondary | ICD-10-CM | POA: Diagnosis not present

## 2018-02-21 MED ORDER — SODIUM CHLORIDE 0.9 % IV SOLN: 500.0000 mL | Freq: Once | INTRAVENOUS | Status: DC

## 2018-02-21 NOTE — Progress Notes (Signed)
After removing chest leads, pt experienced some redness and petechiae on her upper chest.  After using the bathroom, vaseline was applied to her reddened areas and vaseline pack given with instructions to use at home.  Pt agreed.

## 2018-02-21 NOTE — Patient Instructions (Signed)
Handouts given for polyps and diverticulosis  YOU HAD AN ENDOSCOPIC PROCEDURE TODAY AT [facility]:   Refer to the procedure report that was given to you for any specific questions about what was found during the examination.  If the procedure report does not answer your questions, please call your gastroenterologist to clarify.  If you requested that your care partner not be given the details of your procedure findings, then the procedure report has been included in a sealed envelope for you to review at your convenience later.  YOU SHOULD EXPECT: Some feelings of bloating in the abdomen. Passage of more gas than usual.  Walking can help get rid of the air that was put into your GI tract during the procedure and reduce the bloating. If you had a lower endoscopy (such as a colonoscopy or flexible sigmoidoscopy) you may notice spotting of blood in your stool or on the toilet paper. If you underwent a bowel prep for your procedure, you may not have a normal bowel movement for a few days.  Please Note:  You might notice some irritation and congestion in your nose or some drainage.  This is from the oxygen used during your procedure.  There is no need for concern and it should clear up in a day or so.  SYMPTOMS TO REPORT IMMEDIATELY:   Following lower endoscopy (colonoscopy or flexible sigmoidoscopy):  Excessive amounts of blood in the stool  Significant tenderness or worsening of abdominal pains  Swelling of the abdomen that is new, acute  Fever of 100F or higher  For urgent or emergent issues, a gastroenterologist can be reached at any hour by calling [phone].   DIET:  We do recommend a small meal at first, but then you may proceed to your regular diet.  Drink plenty of fluids but you should avoid alcoholic beverages for 24 hours.  ACTIVITY:  You should plan to take it easy for the rest of today and you should NOT DRIVE or use heavy machinery until tomorrow (because of  the sedation medicines used during the test).    FOLLOW UP: Our staff will call the number listed on your records the next business day following your procedure to check on you and address any questions or concerns that you may have regarding the information given to you following your procedure. If we do not reach you, we will leave a message.  However, if you are feeling well and you are not experiencing any problems, there is no need to return our call.  We will assume that you have returned to your regular daily activities without incident.  If any biopsies were taken you will be contacted by phone or by letter within the next 1-3 weeks.  Please call us at [phone] if you have not heard about the biopsies in 3 weeks.    SIGNATURES/CONFIDENTIALITY: You and/or your care partner have signed paperwork which will be entered into your electronic medical record.  These signatures attest to the fact that that the information above on your After Visit Summary has been reviewed and is understood.  Full responsibility of the confidentiality of this discharge information lies with you and/or your care-partner.

## 2018-02-21 NOTE — Progress Notes (Signed)
Pt's states no medical or surgical changes since previsit or office visit. 

## 2018-02-21 NOTE — Progress Notes (Signed)
Called to room to assist during endoscopic procedure.  Patient ID and intended procedure confirmed with present staff. Received instructions for my participation in the procedure from the performing physician.  

## 2018-02-21 NOTE — Op Note (Signed)
[facility] Patient Name: Bridget [name] Procedure Date: 02/21/2018 1:41 PM MRN: [id number] Endoscopist: Sherilyn Cooter L. Myrtie Neither , MD Age: 77 Referring MD:  Date of Birth: 15-Jan-1941 Gender: Female Account #: [id number] Procedure:                Colonoscopy Indications:              Surveillance: Personal history of adenomatous                            polyps on last colonoscopy 5 years ago (<67mm                            tubular adenoma 10/2012) Medicines:                Monitored Anesthesia Care Procedure:                Pre-Anesthesia Assessment:                           - Prior to the procedure, a History and Physical                            was performed, and patient medications and                            allergies were reviewed. The patient's tolerance of                            previous anesthesia was also reviewed. The risks                            and benefits of the procedure and the sedation                            options and risks were discussed with the patient.                            All questions were answered, and informed consent                            was obtained. Anticoagulants: The patient has taken                            aspirin. It was decided not to withhold this                            medication prior to the procedure. ASA Grade                            Assessment: II - A patient with mild systemic                            disease. After reviewing the risks and benefits,  the patient was deemed in satisfactory condition to                            undergo the procedure.                           After obtaining informed consent, the colonoscope                            was passed under direct vision. Throughout the                            procedure, the patient's blood pressure, pulse, and                            oxygen saturations were monitored continuously. The         Colonoscope was introduced through the anus and                            advanced to the the cecum, identified by                            appendiceal orifice and ileocecal valve. The                            colonoscopy was extremely difficult due to multiple                            diverticula in the colon, fair bowel prep, a                            redundant colon, significant looping and a tortuous                            colon. Successful completion of the procedure was                            aided by changing the patient to a supine position                            and using manual pressure. The patient tolerated                            the procedure well. The quality of the bowel                            preparation was fair (large amount of lavage                            performed to improve visualization). The ileocecal                            valve, appendiceal orifice, and rectum were  photographed. The bowel preparation used was Plenvu. Scope In: 1:51:56 PM Scope Out: 2:21:38 PM Scope Withdrawal Time: 0 hours 18 minutes 27 seconds  Total Procedure Duration: 0 hours 29 minutes 42 seconds  Findings:                 The digital rectal exam findings include decreased                            sphincter tone.                           Many diverticula were found in the sigmoid colon,                            with associated tortuosity and fixed angulation,                            making scope passage very challenging.                           Two sessile polyps were found in the cecum. The                            polyps were 4 to 6 mm in size. These polyps were                            removed with a cold snare. Resection and retrieval                            were complete.                           Four sessile polyps were found in the ascending                            colon. The polyps were 2 to 5  mm in size. These                            polyps were removed with a cold snare. Resection                            and retrieval were complete.                           A 4 mm polyp was found in the hepatic flexure. The                            polyp was sessile. The polyp was removed with a                            cold snare. Resection and retrieval were complete.                           Retroflexion in the rectum  was not performed due to                            anatomy.                           The exam was otherwise without abnormality. Complications:            No immediate complications. Estimated Blood Loss:     Estimated blood loss was minimal. Impression:               - Preparation of the colon was poor.                           - Decreased sphincter tone found on digital rectal                            exam.                           - Diverticulosis in the sigmoid colon.                           - Two 4 to 6 mm polyps in the cecum, removed with a                            cold snare. Resected and retrieved.                           - Four 2 to 5 mm polyps in the ascending colon,                            removed with a cold snare. Resected and retrieved.                           - One 4 mm polyp at the hepatic flexure, removed                            with a cold snare. Resected and retrieved.                           - The examination was otherwise normal. Recommendation:           - Patient has a contact number available for                            emergencies. The signs and symptoms of potential                            delayed complications were discussed with the                            patient. Return to normal activities tomorrow.                            Written  discharge instructions were provided to the                            patient.                           - Resume previous diet.                           - Continue  present medications.                           - Await pathology results.                           - No repeat surveillance colonoscopy due to age and                            increased risk of procedure due to                            anatomy/diverticulosis and difficult scope passage. Charlet Harr L. Myrtie Neither, MD 02/21/2018 2:29:11 PM This report has been signed electronically.

## 2018-02-21 NOTE — Progress Notes (Signed)
To PACU, VSS. Report to [email or web address] 

## 2018-02-22 ENCOUNTER — Telehealth: Payer: Self-pay | Admitting: *Deleted

## 2018-02-22 NOTE — Telephone Encounter (Signed)
  Follow up Call-  Call back number 02/21/2018  Post procedure Call Back phone  # [phone]  Permission to leave phone message Yes  Some recent data might be hidden     Patient questions:  Do you have a fever, pain , or abdominal swelling? No. Pain Score  0 *  Have you tolerated food without any problems? Yes.    Have you been able to return to your normal activities? Yes.    Do you have any questions about your discharge instructions: Diet   No. Medications  No. Follow up visit  No.  Do you have questions or concerns about your Care? No.  Actions: * If pain score is 4 or above: No action needed, pain <4.

## 2018-02-27 DIAGNOSIS — B351 Tinea unguium: Secondary | ICD-10-CM | POA: Diagnosis not present

## 2018-02-27 DIAGNOSIS — L570 Actinic keratosis: Secondary | ICD-10-CM | POA: Diagnosis not present

## 2018-02-28 ENCOUNTER — Encounter: Payer: Self-pay | Admitting: Gastroenterology

## 2018-05-14 ENCOUNTER — Encounter: Payer: Self-pay | Admitting: Internal Medicine

## 2018-05-14 ENCOUNTER — Ambulatory Visit: Payer: Medicare HMO | Admitting: Internal Medicine

## 2018-05-14 VITALS — BP 122/62 | HR 75 | Ht 65.0 in | Wt 164.6 lb

## 2018-05-14 DIAGNOSIS — E785 Hyperlipidemia, unspecified: Secondary | ICD-10-CM | POA: Diagnosis not present

## 2018-05-14 DIAGNOSIS — I10 Essential (primary) hypertension: Secondary | ICD-10-CM

## 2018-05-14 MED ORDER — CARVEDILOL 3.125 MG PO TABS: 3.1250 mg | ORAL_TABLET | Freq: Two times a day (BID) | ORAL | 3 refills | Status: DC

## 2018-05-14 NOTE — Patient Instructions (Signed)
Medication Instructions:  Stop Carvedilol Decrease Carvedilol to 3.125 mg twice a day Stop Fish Oil Stop CoQ10  If you need a refill on your cardiac medications before your next appointment, please call your pharmacy.   Lab work: Fasting Lipid Panel If you have labs (blood work) drawn today and your tests are completely normal, you will receive your results only by: Marland Kitchen MyChart Message (if you have MyChart) OR . A paper copy in the mail If you have any lab test that is abnormal or we need to change your treatment, we will call you to review the results.  Testing/Procedures: None ordered  Follow-Up: At [facility], you and your health needs are our priority.  As part of our continuing mission to provide you with exceptional heart care, we have created designated Provider Care Teams.  These Care Teams include your primary Cardiologist (physician) and Advanced Practice Providers (APPs -  Physician Assistants and Nurse Practitioners) who all work together to provide you with the care you need, when you need it. . Follow Up with Dr.Hilty in 1 year  Call 3 months before to schedule

## 2018-05-14 NOTE — Progress Notes (Signed)
OFFICE NOTE  Chief Complaint:  Annual follow-up  Primary Care Physician: Ronita Hipps, MD  HPI:  Bridget [name]  is a 78 year old female I have been seeing annually with a history of fatigue in the past. Actually when I saw her the last time she was short of breath only walking across the kitchen. We did undergo a significant workup which was basically normal. She returns today and says those symptoms have completely resolved and is unclear what they were due to. Unfortunately in February as you know she had a left hip replacement and is due for right knee replacement sometime in the near future. Denies any chest pain, worsening shortness of breath, palpitations, presyncope or syncopal symptoms.  Her main complaint is arthritis which is now developing in her left hip and knee. She's been hesitant to take meloxicam due to concerns about worsening heart disease. She reported she had cholesterol testing several months ago and is due for this again. The only other change is that she was noted to be taken off of amlodipine and switched to benazepril. She's not sure that that works as well for her blood pressure, but it does appear well-controlled today 122/82.  This was due to lower extremity swelling.  I saw Bridget [name] back in the office today. She's been having some problems with recurrent headaches. She's currently go to headache clinic and has been weaned off of all NSAIDs. She denies any chest pain or worsening shortness of breath. Her blood pressure control is been excellent. She's not had reassessment of her cholesterol to my knowledge since her last study in 2014.  Bridget [name] returns today for follow-up. She is being seen Dr. Joya Salm for back pain. She is on gabapentin 3 times daily but continues to have neuropathic symptoms. She also takes Percocet for pain. Blood pressure is well-controlled today. She denies any chest pain or worsening shortness of breath. EKG shows normal sinus  rhythm.  04/14/2016  Bridget [name] was seen today in annual follow-up. She has a number of concerns today including recurrent migraine headaches. She says they have improved somewhat with treatment by a neurologist however she continues to have problems with them. She also has labile blood pressures. When asked more questions about this it seems that she tends to adjust her medication doses based on what her blood pressure is. She may take her blood pressure for 5 times a day and if she feels like the blood pressures too low she may take less than the prescribed dose of medication or perhaps take additional medication if the blood pressure is too high. Given the long-term effects of these medicines which are both 16-24 hour half-lives, this strategy is probably causing her to have the high and low blood pressures that she seeing. Today her blood pressure was 98/62 and she is slightly presyncopal with this.  05/01/2017  Bridget [name] returns today for follow-up.  Overall she is doing well.  Her blood pressure is excellent today at 122/78.  Since she fell straight of the twice daily carvedilol and the dose was reduced and placed on long-acting carvedilol 20 mg daily.  She remains on irbesartan.  She denies any chest pain or palpitations.  Cholesterol was assessed last summer showed total cholesterol 158, HDL 48, LDL 77 and triglycerides 167.  05/18/2018  Bridget [name] was seen today in annual follow-up.  Overall she is doing well without any new complaints.  She was concerned that there were 2 different beta-blockers on  her med list.  Apparently a long-acting carvedilol as well as a short acting carvedilol.  She says she was having problems with low blood pressure and decreased her carvedilol from 6.25 mg twice daily to once daily.  I believe she was offered the long-acting carvedilol but never really took it.  In addition she had decreased her rosuvastatin to 10 mg daily.  She has not had recent lipids.  Blood pressure  is well controlled today.  PMHx:  Past Medical History:  Diagnosis Date  . Allergy   . Arthritis    hip OA  . Degenerative disc disease   . GERD (gastroesophageal reflux disease)   . Hyperlipidemia   . Hypertension   . Hypothyroidism   . Migraines   . Neuromuscular disorder ([place])   . Neuropathy   . Seasonal allergies   . 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 CATARACT EXTRACTION, BILATERAL    . CERVICAL DISCECTOMY  2005   with fusion  . CHOLECYSTECTOMY  2006  . COLONOSCOPY    . LUMBAR LAMINECTOMY/DECOMPRESSION MICRODISCECTOMY Right 11/13/2014   Procedure: Right Lumbar four-five Microdiskectomy;  Surgeon: Leeroy Cha, MD;  Location: [facility];  Service: Neurosurgery;  Laterality: Right;  right  . POLYPECTOMY    . 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 TOTAL ABDOMINAL HYSTERECTOMY  1978  . TOTAL HIP ARTHROPLASTY Left 04/2011  . TRANSTHORACIC ECHOCARDIOGRAM  12/2008   EF=>55%, borderline conc LVH; trace MR; mod TR; trace AV regurg    FAMHx:  Family History  Problem Relation Age of Onset  . Stroke Mother 62  . Coronary artery disease Father 60  . Colon polyps Father   . Colon cancer Cousin   . Esophageal cancer Cousin   . Stroke Maternal Grandfather   . Stroke Paternal Grandmother   . Breast cancer Paternal Grandmother   . Suicidality Daughter   . Rectal cancer Neg Hx   . Stomach cancer Neg Hx     SOCHx:   reports that she has never smoked. She has never used smokeless tobacco. She reports that she does not drink alcohol or use drugs.  ALLERGIES:  Allergies  Allergen Reactions  . Codeine Anaphylaxis  . Other Anaphylaxis  . Sulfonamide Derivatives Anaphylaxis  . 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 Amlodipine Other (See Comments)    swelling    ROS: Pertinent items noted in HPI and remainder of comprehensive ROS otherwise negative.  HOME  MEDS: Current Outpatient Medications  Medication Sig Dispense Refill  . aspirin 81 MG tablet Take 81 mg by mouth daily.    Marland Kitchen aspirin-acetaminophen-caffeine (EXCEDRIN MIGRAINE) 250-250-65 MG tablet Frequency:as needed   Dosage:250-250-65   MG  Instructions:Excedrin Migraine (250-250-65MG  TABS, Oral as needed)  Note:    . baclofen (LIORESAL) 10 MG tablet As needed    . Cholecalciferol (VITAMIN D-1000 MAX ST) 25 MCG (1000 UT) tablet Take by mouth.    . Coenzyme Q10 100 MG capsule Take by mouth.    Eduard Roux (AIMOVIG [place]) Inject into the skin every 30 (thirty) days.     Marland Kitchen gabapentin (NEURONTIN) 600 MG tablet Take 600 mg by mouth 3 (three) times daily.    Marland Kitchen imipramine (TOFRANIL) 25 MG tablet Take 25 mg by mouth at bedtime.  1  . irbesartan (AVAPRO) 75 MG tablet Take 75 mg by mouth daily.    Marland Kitchen  levothyroxine (SYNTHROID, LEVOTHROID) 75 MCG tablet Take 75 mcg by mouth daily before breakfast.    . Multiple Vitamin (MULTIVITAMIN) capsule Take 1 capsule by mouth daily.    . Omega-3 Fatty Acids (FISH OIL) 1000 MG CAPS Take by mouth.    . promethazine (PHENERGAN) 25 MG tablet Take by mouth.    . rosuvastatin (CRESTOR) 10 MG tablet Take 1 tablet (10 mg total) by mouth daily. 28 tablet 0  . traMADol (ULTRAM) 50 MG tablet Patient takes 1-2 tablets by mouth every 4 hours as needed for migraine pain and neuropathy.    . carvedilol (COREG CR) 20 MG 24 hr capsule Take 20 mg by mouth.    . carvedilol (COREG) 6.25 MG tablet TAKE 1 TABLET(6.25 MG) BY MOUTH TWICE DAILY 180 tablet 0   No current facility-administered medications for this visit.     LABS/IMAGING: No results found for this or any previous visit (from the past 48 hour(s)). No results found.  VITALS: BP 122/62   Pulse 75   Ht 5\' 5"  (1.651 m)   Wt 164 lb 9.6 oz (74.7 kg)   LMP  (LMP Unknown)   BMI 27.39 kg/m   EXAM: General appearance: alert and no distress Neck: no carotid bruit and no JVD Lungs: clear to auscultation  bilaterally Heart: regular rate and rhythm, S1, S2 normal, no murmur, click, rub or gallop Abdomen: soft, non-tender; bowel sounds normal; no masses,  no organomegaly Extremities: extremities normal, atraumatic, no cyanosis or edema Pulses: 2+ and symmetric Skin: Skin color, texture, turgor normal. No rashes or lesions Neurologic: Grossly normal Psych: Mood, affect normal  EKG: Normal sinus rhythm 75, left axis deviation-personally reviewed  ASSESSMENT: 1. Hypertension-well controlled 2. Dyslipidemia-on Crestor 3. Arthritis 4. Headaches 5. LBP -neuropathy  PLAN: 1.   Bridget [name] remains asymptomatic and denies palpitations or chest pain.  Her blood pressure is well controlled.  She is not had a recent lipid assessment which we will order.  Since she was on a lower dose of carvedilol and this is a short acting medication, would advise we switch her to 3.125 mg twice daily.  In addition I advised her to discontinue coenzyme every 10 and fish oil because neither is shown significant cardiovascular benefit.  Plan follow-up annually or sooner as necessary.  Pixie Casino, MD, [facility], [place] Director of the Advanced Lipid Disorders &  Cardiovascular Risk Reduction Clinic Diplomate of the American Board of Clinical Lipidology Attending Cardiologist  Direct Dial: [phone]  Fax: [phone]  Website:  [email or web address]  Bridget [name] 05/14/2018, 2:08 PM

## 2018-05-18 ENCOUNTER — Encounter: Payer: Self-pay | Admitting: Internal Medicine

## 2018-05-22 LAB — LIPID PANEL
CHOL/HDL RATIO: 2.6 ratio (ref 0.0–4.4)
Cholesterol, Total: 179 mg/dL (ref 100–199)
HDL: 68 mg/dL (ref >39)
LDL CALC: 88 mg/dL (ref 0–99)
Triglycerides: 114 mg/dL (ref 0–149)
VLDL CHOLESTEROL CAL: 23 mg/dL (ref 5–40)

## 2018-10-01 ENCOUNTER — Ambulatory Visit: Payer: Self-pay | Admitting: Podiatry

## 2018-10-01 ENCOUNTER — Other Ambulatory Visit: Payer: Self-pay

## 2018-10-07 ENCOUNTER — Ambulatory Visit: Payer: Medicare HMO | Admitting: Podiatry

## 2018-10-10 DIAGNOSIS — M722 Plantar fascial fibromatosis: Secondary | ICD-10-CM | POA: Insufficient documentation

## 2018-10-10 DIAGNOSIS — B351 Tinea unguium: Secondary | ICD-10-CM | POA: Insufficient documentation

## 2018-11-21 ENCOUNTER — Other Ambulatory Visit: Payer: Self-pay | Admitting: Internal Medicine

## 2018-11-21 MED ORDER — IRBESARTAN 75 MG PO TABS: 75.0000 mg | ORAL_TABLET | Freq: Every day | ORAL | 1 refills | Status: DC

## 2018-11-21 NOTE — Telephone Encounter (Signed)
Pt's medication was sent to pt's pharmacy as requested. Confirmation received.  °

## 2019-04-30 ENCOUNTER — Encounter: Payer: Self-pay | Admitting: Internal Medicine

## 2019-04-30 ENCOUNTER — Other Ambulatory Visit (HOSPITAL_COMMUNITY)
Admission: RE | Admit: 2019-04-30 | Discharge: 2019-04-30 | Disposition: A | Discharge status: Home / Self Care | Payer: Medicare HMO | Source: Ambulatory Visit | Attending: Internal Medicine | Admitting: Internal Medicine

## 2019-04-30 ENCOUNTER — Other Ambulatory Visit: Payer: Self-pay

## 2019-04-30 ENCOUNTER — Other Ambulatory Visit: Payer: Self-pay | Admitting: Internal Medicine

## 2019-04-30 ENCOUNTER — Ambulatory Visit (INDEPENDENT_AMBULATORY_CARE_PROVIDER_SITE_OTHER): Payer: Medicare HMO | Admitting: Internal Medicine

## 2019-04-30 VITALS — BP 148/93 | HR 138 | Temp 93.4°F | Ht 66.0 in | Wt 165.8 lb

## 2019-04-30 DIAGNOSIS — Z20822 Contact with and (suspected) exposure to covid-19: Secondary | ICD-10-CM | POA: Diagnosis not present

## 2019-04-30 DIAGNOSIS — I4892 Unspecified atrial flutter: Secondary | ICD-10-CM

## 2019-04-30 DIAGNOSIS — E039 Hypothyroidism, unspecified: Secondary | ICD-10-CM

## 2019-04-30 DIAGNOSIS — Z01812 Encounter for preprocedural laboratory examination: Secondary | ICD-10-CM | POA: Diagnosis present

## 2019-04-30 LAB — BASIC METABOLIC PANEL
BUN/Creatinine Ratio: 22 (ref 12–28)
BUN: 19 mg/dL (ref 8–27)
CO2: 26 mmol/L (ref 20–29)
Calcium: 10 mg/dL (ref 8.7–10.3)
Chloride: 102 mmol/L (ref 96–106)
Creatinine, Ser: 0.85 mg/dL (ref 0.57–1.00)
GFR calc Af Amer: 76 mL/min/{1.73_m2} (ref >59)
GFR calc non Af Amer: 66 mL/min/{1.73_m2} (ref >59)
Glucose: 103 mg/dL — ABNORMAL HIGH (ref 65–99)
Potassium: 4.1 mmol/L (ref 3.5–5.2)
Sodium: 143 mmol/L (ref 134–144)

## 2019-04-30 LAB — TSH+FREE T4
Free T4: 1.59 ng/dL (ref 0.82–1.77)
TSH: 4.29 u[IU]/mL (ref 0.450–4.500)

## 2019-04-30 LAB — CBC
Hematocrit: 41.9 % (ref 34.0–46.6)
Hemoglobin: 14 g/dL (ref 11.1–15.9)
MCH: 29.2 pg (ref 26.6–33.0)
MCHC: 33.4 g/dL (ref 31.5–35.7)
MCV: 87 fL (ref 79–97)
Platelets: 187 10*3/uL (ref 150–450)
RBC: 4.8 x10E6/uL (ref 3.77–5.28)
RDW: 13.2 % (ref 11.7–15.4)
WBC: 7.6 10*3/uL (ref 3.4–10.8)

## 2019-04-30 LAB — SARS CORONAVIRUS 2 (TAT 6-24 HRS): SARS Coronavirus 2: NEGATIVE

## 2019-04-30 MED ORDER — CARVEDILOL 6.25 MG PO TABS: 6.2500 mg | ORAL_TABLET | Freq: Two times a day (BID) | ORAL | 3 refills | Status: DC

## 2019-04-30 MED ORDER — APIXABAN 5 MG PO TABS: 5.0000 mg | ORAL_TABLET | Freq: Two times a day (BID) | ORAL | 11 refills | Status: DC

## 2019-04-30 NOTE — Patient Instructions (Addendum)
Medication Instructions:  START eliquis 5mg  twice daily STOP aspirin INCREASE carvedilol to 6.25mg   *If you need a refill on your cardiac medications before your next appointment, please call your pharmacy*  Lab Work: LABS today  [place] screening today An appointment has been made at 10:45am. This is a Drive Up Visit at the [facility] [address]. Please tell them that you are there for pre-procedure testing. Someone will direct you to the appropriate testing line. Stay in your car and someone will be with you shortly. Please make sure to have all other labs completed before this test because you will need to stay quarantined until your procedure. Please take your insurance card to this test.   If you have labs (blood work) drawn today and your tests are completely normal, you will receive your results only by: Marland Kitchen MyChart Message (if you have MyChart) OR . A paper copy in the mail If you have any lab test that is abnormal or we need to change your treatment, we will call you to review the results.  Testing/Procedures: TEE/Cardioversion to be completed by Dr. Debara Pickett @ [place]: At [facility], you and your health needs are our priority.  As part of our continuing mission to provide you with exceptional heart care, we have created designated Provider Care Teams.  These Care Teams include your primary Cardiologist (physician) and Advanced Practice Providers (APPs -  Physician Assistants and Nurse Practitioners) who all work together to provide you with the care you need, when you need it.  Your next appointment:   4 week(s)  The format for your next appointment:   In Person  Provider:   You may see Dr. Debara Pickett or one of the following Advanced Practice Providers on your designated Care Team:    Almyra Deforest, PA-C  Fabian Sharp, [place] or   Roby Lofts, [place]   Other Instructions   You are scheduled for a TEE/Cardioversion on Friday Feb. 12, 2021 with  Dr. Lyman Bishop.  Please arrive at the [facility] (Main Entrance A) at [facility]: [address] [place],  [address] at 8 am. (1 hour prior to procedure unless lab work is needed; if lab work is needed arrive 1.5 hours ahead)  DIET: Nothing to eat or drink after midnight except a sip of water with medications (see medication instructions below)  Medication Instructions: Hold - N/A  Continue your anticoagulant: Eliquis You will need to continue your anticoagulant after your procedure until you are told by your provider that it is safe to stop   Labs: to be completed today Feb 10 - blood test & COVID screening   You must have a responsible person to drive you home and stay in the waiting area during your procedure. Failure to do so could result in cancellation.  Bring your insurance cards.  *Special Note: Every effort is made to have your procedure done on time. Occasionally there are emergencies that occur at the hospital that may cause delays. Please be patient if a delay does occur.

## 2019-04-30 NOTE — Progress Notes (Signed)
OFFICE NOTE  Chief Complaint:  Fatigue, palpitations, shortness of breath  Primary Care Physician: Ronita Hipps, MD  HPI:  Bridget [name]  is a 79 year old female I have been seeing annually with a history of fatigue in the past. Actually when I saw her the last time she was short of breath only walking across the kitchen. We did undergo a significant workup which was basically normal. She returns today and says those symptoms have completely resolved and is unclear what they were due to. Unfortunately in February as you know she had a left hip replacement and is due for right knee replacement sometime in the near future. Denies any chest pain, worsening shortness of breath, palpitations, presyncope or syncopal symptoms.  Her main complaint is arthritis which is now developing in her left hip and knee. She's been hesitant to take meloxicam due to concerns about worsening heart disease. She reported she had cholesterol testing several months ago and is due for this again. The only other change is that she was noted to be taken off of amlodipine and switched to benazepril. She's not sure that that works as well for her blood pressure, but it does appear well-controlled today 122/82.  This was due to lower extremity swelling.  I saw [name] back in the office today. She's been having some problems with recurrent headaches. She's currently go to headache clinic and has been weaned off of all NSAIDs. She denies any chest pain or worsening shortness of breath. Her blood pressure control is been excellent. She's not had reassessment of her cholesterol to my knowledge since her last study in 2014.  [name] returns today for follow-up. She is being seen Dr. Joya Salm for back pain. She is on gabapentin 3 times daily but continues to have neuropathic symptoms. She also takes Percocet for pain. Blood pressure is well-controlled today. She denies any chest pain or worsening shortness of breath. EKG  shows normal sinus rhythm.  04/14/2016  [name] was seen today in annual follow-up. She has a number of concerns today including recurrent migraine headaches. She says they have improved somewhat with treatment by a neurologist however she continues to have problems with them. She also has labile blood pressures. When asked more questions about this it seems that she tends to adjust her medication doses based on what her blood pressure is. She may take her blood pressure for 5 times a day and if she feels like the blood pressures too low she may take less than the prescribed dose of medication or perhaps take additional medication if the blood pressure is too high. Given the long-term effects of these medicines which are both 16-24 hour half-lives, this strategy is probably causing her to have the high and low blood pressures that she seeing. Today her blood pressure was 98/62 and she is slightly presyncopal with this.  05/01/2017  [name] returns today for follow-up.  Overall she is doing well.  Her blood pressure is excellent today at 122/78.  Since she fell straight of the twice daily carvedilol and the dose was reduced and placed on long-acting carvedilol 20 mg daily.  She remains on irbesartan.  She denies any chest pain or palpitations.  Cholesterol was assessed last summer showed total cholesterol 158, HDL 48, LDL 77 and triglycerides 167.  05/18/2018  [name] was seen today in annual follow-up.  Overall she is doing well without any new complaints.  She was concerned that there were 2  different beta-blockers on her med list.  Apparently a long-acting carvedilol as well as a short acting carvedilol.  She says she was having problems with low blood pressure and decreased her carvedilol from 6.25 mg twice daily to once daily.  I believe she was offered the long-acting carvedilol but never really took it.  In addition she had decreased her rosuvastatin to 10 mg daily.  She has not had recent  lipids.  Blood pressure is well controlled today.  04/30/2019  [name] returns for follow-up which is routine.  She says for the past several weeks to up to 2 months she has had some worsening fatigue, palpitations and shortness of breath.  She spoke with her husband who suggested an earlier appointment however she decided to wait for her routine visit.  She is noted to be tachycardic today and an EKG demonstrated she is in atrial flutter with 2:1 conduction.  This is a new finding for her.  She has no history of A. fib or flutter.  She does have a history of hypothyroidism on levothyroxine.  She reports some fatigue and shortness of breath.  There is some trace ankle edema.  No complaints of chest pain.  PMHx:  Past Medical History:  Diagnosis Date  . Allergy   . Arthritis    hip OA  . Degenerative disc disease   . GERD (gastroesophageal reflux disease)   . Hyperlipidemia   . Hypertension   . Hypothyroidism   . Migraines   . Neuromuscular disorder ([place])   . Neuropathy   . Seasonal allergies   . 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 CATARACT EXTRACTION, BILATERAL    . CERVICAL DISCECTOMY  2005   with fusion  . CHOLECYSTECTOMY  2006  . COLONOSCOPY    . LUMBAR LAMINECTOMY/DECOMPRESSION MICRODISCECTOMY Right 11/13/2014   Procedure: Right Lumbar four-five Microdiskectomy;  Surgeon: Leeroy Cha, MD;  Location: [facility];  Service: Neurosurgery;  Laterality: Right;  right  . POLYPECTOMY    . 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 TOTAL ABDOMINAL HYSTERECTOMY  1978  . TOTAL HIP ARTHROPLASTY Left 04/2011  . TRANSTHORACIC ECHOCARDIOGRAM  12/2008   EF=>55%, borderline conc LVH; trace MR; mod TR; trace AV regurg    FAMHx:  Family History  Problem Relation Age of Onset  . Stroke Mother 74  . Coronary artery disease Father 44  . Colon polyps Father   . Colon cancer Cousin   . Esophageal cancer Cousin   . Stroke  Maternal Grandfather   . Stroke Paternal Grandmother   . Breast cancer Paternal Grandmother   . Suicidality Daughter   . Rectal cancer Neg Hx   . Stomach cancer Neg Hx     SOCHx:   reports that she has never smoked. She has never used smokeless tobacco. She reports that she does not drink alcohol or use drugs.  ALLERGIES:  Allergies  Allergen Reactions  . Codeine Anaphylaxis  . Other Anaphylaxis  . Sulfonamide Derivatives Anaphylaxis  . 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 Amlodipine Other (See Comments)    swelling    ROS: Pertinent items noted in HPI and remainder of comprehensive ROS otherwise negative.  HOME MEDS: Current Outpatient Medications  Medication Sig Dispense Refill  . aspirin 81 MG tablet Take 81 mg by mouth daily.    Marland Kitchen aspirin-acetaminophen-caffeine ([place])  250-250-65 MG tablet Frequency:as needed   Dosage:250-250-65   MG  Instructions:Excedrin Migraine (250-250-65MG TABS, Oral as needed)  Note:    . Cholecalciferol (VITAMIN D-1000 MAX ST) 25 MCG (1000 UT) tablet Take by mouth.    Eduard Roux (AIMOVIG [place]) Inject into the skin every 30 (thirty) days.     . fluconazole (DIFLUCAN) 200 MG tablet TK 1 T PO  TWICE WEEKLY WITH FOOD    . gabapentin (NEURONTIN) 600 MG tablet Take 600 mg by mouth 3 (three) times daily.    . irbesartan (AVAPRO) 75 MG tablet Take 1 tablet (75 mg total) by mouth daily. 90 tablet 1  . levothyroxine (SYNTHROID, LEVOTHROID) 75 MCG tablet Take 75 mcg by mouth daily before breakfast.    . Multiple Vitamin (MULTIVITAMIN) capsule Take 1 capsule by mouth daily.    . promethazine (PHENERGAN) 25 MG tablet Take by mouth.    . rosuvastatin (CRESTOR) 10 MG tablet Take 1 tablet (10 mg total) by mouth daily. 28 tablet 0  . traMADol (ULTRAM) 50 MG tablet Patient takes 1-2 tablets by mouth every 4 hours as needed for migraine pain and neuropathy.    . baclofen (LIORESAL) 10 MG tablet As needed     . carvedilol (COREG) 3.125 MG tablet Take 1 tablet (3.125 mg total) by mouth 2 (two) times daily. 180 tablet 3  . imipramine (TOFRANIL) 25 MG tablet Take 25 mg by mouth at bedtime.  1   No current facility-administered medications for this visit.    LABS/IMAGING: No results found for this or any previous visit (from the past 48 hour(s)). No results found.  VITALS: BP (!) 148/93   Pulse (!) 138   Temp (!) 93.4 F (34.1 C)   Ht _0  (1.676 m)   Wt 165 lb 12.8 oz (75.2 kg)   LMP  (LMP Unknown)   SpO2 99%   BMI 26.76 kg/m   EXAM: General appearance: alert and no distress Neck: JVD - 2 cm above sternal notch and no carotid bruit Lungs: diminished breath sounds bibasilar Heart: regular rate and rhythm and Tachycardic Abdomen: soft, non-tender; bowel sounds normal; no masses,  no organomegaly Extremities: edema Trace ankle Pulses: 2+ and symmetric Skin: Pale, cool, dry Neurologic: Grossly normal Psych: Mood, affect normal  EKG: Atrial flutter with 2-1 AV conduction at 138, nonspecific ST-T wave changes-personally reviewed  ASSESSMENT: 1. New onset atrial flutter with 2-1 AV conduction 2. Chads vas score of 3 3. Hypertension-well controlled 4. Dyslipidemia-on Crestor 5. Arthritis 6. Headaches 7. LBP -neuropathy  PLAN: 1.   Ms. Mowers appears in some mild distress.  She is noted to be in new onset atrial flutter with 2-1 AV conduction with a very fast rate of 138.  It is likely she has been in this rhythm for 1 to 2 months.  This could possibly have led to some heart failure.  She has been on low-dose beta-blocker.  I advised her to increase her carvedilol to 6.25 mg twice daily.  We will start Eliquis 5 mg twice daily this morning.  She should have 5 doses by Friday.  Fortunately on the scheduled TEE provider on Friday and will arrange for a TEE cardioversion.  We will stop aspirin.  I am concerned there may be some cardiomyopathy.  She does not appear grossly volume  overloaded today.  Further medication adjustments may be made in the hospital on Friday.  We will pursue Covid testing as well today.  Check routine labs including  CBC and be met as well as TSH and free T4 to make sure she is not iatrogenically hyperthyroid which could also cause her to be in atrial flutter.  Follow-up after her cardioversion.  Pixie Casino, MD, [facility], [place] Director of the Advanced Lipid Disorders &  Cardiovascular Risk Reduction Clinic Diplomate of the American Board of Clinical Lipidology Attending Cardiologist  Direct Dial: [phone]  Fax: [phone]  Website:  [email or web address] 04/30/2019, 9:07 AM

## 2019-05-02 ENCOUNTER — Ambulatory Visit (HOSPITAL_COMMUNITY): Payer: Medicare HMO | Admitting: Certified Registered Nurse Anesthetist

## 2019-05-02 ENCOUNTER — Encounter (HOSPITAL_COMMUNITY): Payer: Self-pay | Admitting: Internal Medicine

## 2019-05-02 ENCOUNTER — Encounter (HOSPITAL_COMMUNITY)
Admission: RE | Disposition: A | Discharge status: Home / Self Care | Payer: Self-pay | Source: Home / Self Care | Attending: Internal Medicine

## 2019-05-02 ENCOUNTER — Other Ambulatory Visit: Payer: Self-pay

## 2019-05-02 ENCOUNTER — Ambulatory Visit (HOSPITAL_COMMUNITY)
Admission: RE | Admit: 2019-05-02 | Discharge: 2019-05-02 | Disposition: A | Discharge status: Home / Self Care | Payer: Medicare HMO | Attending: Internal Medicine | Admitting: Internal Medicine

## 2019-05-02 ENCOUNTER — Ambulatory Visit (HOSPITAL_BASED_OUTPATIENT_CLINIC_OR_DEPARTMENT_OTHER)
Admission: RE | Admit: 2019-05-02 | Discharge: 2019-05-02 | Disposition: A | Discharge status: Home / Self Care | Payer: Medicare HMO | Source: Ambulatory Visit | Attending: Internal Medicine | Admitting: Internal Medicine

## 2019-05-02 DIAGNOSIS — Z8249 Family history of ischemic heart disease and other diseases of the circulatory system: Secondary | ICD-10-CM | POA: Diagnosis not present

## 2019-05-02 DIAGNOSIS — I340 Nonrheumatic mitral (valve) insufficiency: Secondary | ICD-10-CM

## 2019-05-02 DIAGNOSIS — I420 Dilated cardiomyopathy: Secondary | ICD-10-CM | POA: Diagnosis not present

## 2019-05-02 DIAGNOSIS — Z96642 Presence of left artificial hip joint: Secondary | ICD-10-CM | POA: Insufficient documentation

## 2019-05-02 DIAGNOSIS — I083 Combined rheumatic disorders of mitral, aortic and tricuspid valves: Secondary | ICD-10-CM | POA: Diagnosis not present

## 2019-05-02 DIAGNOSIS — Z7982 Long term (current) use of aspirin: Secondary | ICD-10-CM | POA: Diagnosis not present

## 2019-05-02 DIAGNOSIS — K219 Gastro-esophageal reflux disease without esophagitis: Secondary | ICD-10-CM | POA: Diagnosis not present

## 2019-05-02 DIAGNOSIS — Z79899 Other long term (current) drug therapy: Secondary | ICD-10-CM | POA: Diagnosis not present

## 2019-05-02 DIAGNOSIS — Z79890 Hormone replacement therapy (postmenopausal): Secondary | ICD-10-CM | POA: Insufficient documentation

## 2019-05-02 DIAGNOSIS — E785 Hyperlipidemia, unspecified: Secondary | ICD-10-CM | POA: Diagnosis not present

## 2019-05-02 DIAGNOSIS — Z888 Allergy status to other drugs, medicaments and biological substances status: Secondary | ICD-10-CM | POA: Diagnosis not present

## 2019-05-02 DIAGNOSIS — M169 Osteoarthritis of hip, unspecified: Secondary | ICD-10-CM | POA: Diagnosis not present

## 2019-05-02 DIAGNOSIS — I351 Nonrheumatic aortic (valve) insufficiency: Secondary | ICD-10-CM

## 2019-05-02 DIAGNOSIS — Z882 Allergy status to sulfonamides status: Secondary | ICD-10-CM | POA: Diagnosis not present

## 2019-05-02 DIAGNOSIS — I4892 Unspecified atrial flutter: Secondary | ICD-10-CM | POA: Diagnosis present

## 2019-05-02 DIAGNOSIS — Z96651 Presence of right artificial knee joint: Secondary | ICD-10-CM | POA: Diagnosis not present

## 2019-05-02 DIAGNOSIS — Z885 Allergy status to narcotic agent status: Secondary | ICD-10-CM | POA: Insufficient documentation

## 2019-05-02 DIAGNOSIS — I10 Essential (primary) hypertension: Secondary | ICD-10-CM | POA: Diagnosis not present

## 2019-05-02 DIAGNOSIS — G709 Myoneural disorder, unspecified: Secondary | ICD-10-CM | POA: Insufficient documentation

## 2019-05-02 DIAGNOSIS — E039 Hypothyroidism, unspecified: Secondary | ICD-10-CM | POA: Diagnosis not present

## 2019-05-02 HISTORY — PX: TEE WITHOUT CARDIOVERSION: SHX5443

## 2019-05-02 HISTORY — PX: CARDIOVERSION: SHX1299

## 2019-05-02 SURGERY — ECHOCARDIOGRAM, TRANSESOPHAGEAL
Anesthesia: Monitor Anesthesia Care

## 2019-05-02 MED ORDER — PROPOFOL 500 MG/50ML IV EMUL
INTRAVENOUS | Status: DC | PRN
  Administered 2019-05-02: 100 ug/kg/min via INTRAVENOUS

## 2019-05-02 MED ORDER — PROPOFOL 10 MG/ML IV BOLUS
INTRAVENOUS | Status: DC | PRN
  Administered 2019-05-02 (×2): 20 mg via INTRAVENOUS

## 2019-05-02 MED ORDER — LIDOCAINE 2% (20 MG/ML) 5 ML SYRINGE
INTRAMUSCULAR | Status: DC | PRN
  Administered 2019-05-02: 60 mg via INTRAVENOUS
  Administered 2019-05-02: 40 mg via INTRAVENOUS

## 2019-05-02 MED ORDER — SODIUM CHLORIDE 0.9 % IV SOLN: INTRAVENOUS | Status: DC

## 2019-05-02 NOTE — H&P (Signed)
   INTERVAL PROCEDURE H&P  History and Physical Interval Note:  05/02/2019 8:36 AM  Bridget [name] has presented today for their planned procedure. The various methods of treatment have been discussed with the patient and family. After consideration of risks, benefits and other options for treatment, the patient has consented to the procedure.  The patients' outpatient history has been reviewed, patient examined, and no change in status from most recent office note within the past 30 days. I have reviewed the patients' chart and labs and will proceed as planned. Questions were answered to the patient's satisfaction.   Pixie Casino, MD, [facility], [place] Director of the Advanced Lipid Disorders &  Cardiovascular Risk Reduction Clinic Diplomate of the American Board of Clinical Lipidology Attending Cardiologist  Direct Dial: [phone]  Fax: [phone]  Website:  [email or web address] 05/02/2019, 8:36 AM

## 2019-05-02 NOTE — Discharge Instructions (Signed)
Electrical Cardioversion   What can I expect after the procedure?  Your blood pressure, heart rate, breathing rate, and blood oxygen level will be monitored until you leave the hospital or clinic.  Your heart rhythm will be watched to make sure it does not change.  You may have some redness on the skin where the shocks were given. Follow these instructions at home:  Do not drive for 24 hours if you were given a sedative during your procedure.  Take over-the-counter and prescription medicines only as told by your health care provider.  Ask your health care provider how to check your pulse. Check it often.  Rest for 48 hours after the procedure or as told by your health care provider.  Avoid or limit your caffeine use as told by your health care provider.  Keep all follow-up visits as told by your health care provider. This is important. Contact a health care provider if:  You feel like your heart is beating too quickly or your pulse is not regular.  You have a serious muscle cramp that does not go away. Get help right away if:  You have discomfort in your chest.  You are dizzy or you feel faint.  You have trouble breathing or you are short of breath.  Your speech is slurred.  You have trouble moving an arm or leg on one side of your body.  Your fingers or toes turn cold or blue. Summary  Electrical cardioversion is the delivery of a jolt of electricity to restore a normal rhythm to the heart.  This procedure may be done right away in an emergency or may be a scheduled procedure if the condition is not an emergency.  Generally, this is a safe procedure.  After the procedure, check your pulse often as told by your health care provider. This information is not intended to replace advice given to you by your health care provider. Make sure you discuss any questions you have with your health care provider. Document Revised: 10/07/2018 Document Reviewed:  10/07/2018 Elsevier Patient Education  2020 Elsevier Inc.  

## 2019-05-02 NOTE — Anesthesia Postprocedure Evaluation (Signed)
Anesthesia Post Note  Patient: Bridget [name]  Procedure(s) Performed: TRANSESOPHAGEAL ECHOCARDIOGRAM (TEE) (N/A ) CARDIOVERSION (N/A )     Patient location during evaluation: Endoscopy Anesthesia Type: MAC Level of consciousness: awake and alert Pain management: pain level controlled Vital Signs Assessment: post-procedure vital signs reviewed and stable Respiratory status: spontaneous breathing, nonlabored ventilation and respiratory function stable Cardiovascular status: blood pressure returned to baseline and stable Postop Assessment: no apparent nausea or vomiting Anesthetic complications: no    Last Vitals:  Vitals:   05/02/19 0958 05/02/19 1005  BP: 103/66 105/75  Pulse: 63 75  Resp: 20 18  Temp: 36.9 C   SpO2: 96% 97%    Last Pain:  Vitals:   05/02/19 1005  PainSc: 0-No pain                 Lynda Rainwater

## 2019-05-02 NOTE — Progress Notes (Signed)
Echocardiogram Echocardiogram Transesophageal has been performed.  Oneal Deputy Chianti Goh 05/02/2019, 10:08 AM

## 2019-05-02 NOTE — Transfer of Care (Signed)
Immediate Anesthesia Transfer of Care Note  Patient: Bridget [name]  Procedure(s) Performed: TRANSESOPHAGEAL ECHOCARDIOGRAM (TEE) (N/A ) CARDIOVERSION (N/A )  Patient Location: Endoscopy Unit  Anesthesia Type:MAC  Level of Consciousness: drowsy  Airway & Oxygen Therapy: Patient Spontanous Breathing  Post-op Assessment: Report given to RN and Post -op Vital signs reviewed and stable  Post vital signs: Reviewed  Last Vitals:  Vitals Value Taken Time  BP 103/66 05/02/19 0956  Temp    Pulse 65 05/02/19 0957  Resp 19 05/02/19 0957  SpO2 97 % 05/02/19 0957  Vitals shown include unvalidated device data.  Last Pain:  Vitals:   05/02/19 0809  PainSc: 0-No pain         Complications: No apparent anesthesia complications

## 2019-05-02 NOTE — Anesthesia Procedure Notes (Signed)
Procedure Name: MAC Date/Time: 05/02/2019 9:31 AM Performed by: Janene Harvey, CRNA Pre-anesthesia Checklist: Patient identified, Emergency Drugs available, Suction available and Patient being monitored Oxygen Delivery Method: Nasal cannula Dental Injury: Teeth and Oropharynx as per pre-operative assessment

## 2019-05-02 NOTE — Anesthesia Preprocedure Evaluation (Signed)
Anesthesia Evaluation  Patient identified by MRN, date of birth, ID band Patient awake    History of Anesthesia Complications (+) PONV  Airway Mallampati: I  TM Distance: >3 FB Neck ROM: Full    Dental  (+) Teeth Intact, Caps, Dental Advisory Given   Pulmonary    breath sounds clear to auscultation       Cardiovascular hypertension, Pt. on medications + dysrhythmias Atrial Fibrillation  Rhythm:Regular Rate:Normal     Neuro/Psych  Headaches,    GI/Hepatic GERD  ,  Endo/Other  Hypothyroidism   Renal/GU      Musculoskeletal  (+) Arthritis ,   Abdominal   Peds  Hematology   Anesthesia Other Findings   Reproductive/Obstetrics                             Anesthesia Physical  Anesthesia Plan  ASA: III  Anesthesia Plan: MAC   Post-op Pain Management:    Induction: Intravenous  PONV Risk Score and Plan: 3 and Treatment may vary due to age or medical condition  Airway Management Planned: Nasal Cannula  Additional Equipment:   Intra-op Plan:   Post-operative Plan:   Informed Consent:   Plan Discussed with: CRNA, Anesthesiologist and Surgeon  Anesthesia Plan Comments:         Anesthesia Quick Evaluation

## 2019-05-02 NOTE — CV Procedure (Signed)
TEE/CARDIOVERSION NOTE  TRANSESOPHAGEAL ECHOCARDIOGRAM (TEE):  Indictation: Atrial Flutter  Consent:   Informed consent was obtained prior to the procedure. The risks, benefits and alternatives for the procedure were discussed and the patient comprehended these risks.  Risks include, but are not limited to, cough, sore throat, vomiting, nausea, somnolence, esophageal and stomach trauma or perforation, bleeding, low blood pressure, aspiration, pneumonia, infection, trauma to the teeth and death.    Time Out: Verified patient identification, verified procedure, site/side was marked, verified correct patient position, special equipment/implants available, medications/allergies/relevent history reviewed, required imaging and test results available. Performed  Procedure:  After a procedural time-out, the patient was given propofol per anesthesia for sedation. The patient's heart rate, blood pressure, and oxygen saturation are monitored continuously during the procedure.   The transesophageal probe was inserted in the esophagus and stomach without difficulty and multiple views were obtained. Agitated microbubble saline contrast was not administered.  Complications:    Complications: None Patient did tolerate procedure well.  Findings:  1. LEFT VENTRICLE: The left ventricular wall thickness is normal.  The left ventricular cavity is dilated in size. Wall motion is severely hypokinetic.  LVEF is 30-35%.  2. RIGHT VENTRICLE:  The right ventricle is normal in structure and function without any thrombus or masses.    3. LEFT ATRIUM:  The left atrium is mildly dilated in size without any thrombus or masses.  There is not spontaneous echo contrast ("smoke") in the left atrium consistent with a low flow state.  4. LEFT ATRIAL APPENDAGE:  The left atrial appendage is free of any thrombus or masses. The appendage has single lobes. Pulse doppler indicates low flow in the appendage.  5. ATRIAL  SEPTUM:  The atrial septum is not aneurysmal - there is a possible very small PFO by color doppler, however, it may be artifact.  6. RIGHT ATRIUM:  The right atrium is normal in size and function without any thrombus or masses.  7. MITRAL VALVE:  The mitral valve is normal in structure and function with Mild regurgitation.  There were no vegetations or stenosis.  8. AORTIC VALVE:  The aortic valve is trileaflet, normal in structure and function with Mild regurgitation.  There were no vegetations or stenosis  9. TRICUSPID VALVE:  The tricuspid valve is normal in structure and function with trivial regurgitation.  There were no vegetations or stenosis  10.  PULMONIC VALVE:  The pulmonic valve is normal in structure and function with no regurgitation.  There were no vegetations or stenosis.   11. AORTIC ARCH, ASCENDING AND DESCENDING AORTA:  There was grade 1 Ron Parker et. Al, 1992) atherosclerosis of the ascending aorta, aortic arch, or proximal descending aorta.  12. PULMONARY VEINS: Anomalous pulmonary venous return was not noted.  13. PERICARDIUM: The pericardium appeared normal and non-thickened.  There is no pericardial effusion.  CARDIOVERSION:     Second Time Out: Verified patient identification, verified procedure, site/side was marked, verified correct patient position, special equipment/implants available, medications/allergies/relevent history reviewed, required imaging and test results available.  Performed  Procedure:  1. Patient placed on cardiac monitor, pulse oximetry, supplemental oxygen as necessary.  2. Sedation administered per anesthesia 3. Pacer pads placed anterior and posterior chest. 4. Cardioverted 1 time(s).  5. Cardioverted at 120J biphasic.  Complications:  Complications: None Patient did tolerate procedure well.  Impression:  1. No LAA thrombus 2. Possible small PFO by color doppler, however, could be artifact. Not aneurysmal. Microbubble contrast not  administered 3. Mild  MR, mild AI 4. Dilated LV with severe hypokinesis 5. LVEF 30-35% 6. Successful DCCV to NSR with a single 120J biphasic shock, short bursts of atrial flutter were noted after the procedure  Recommendations:  1. 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  2. Will refer to cardiac EP to evaluate for atrial flutter ablation.  Time Spent Directly with the Patient:  60 minutes   Pixie Casino, MD, [facility], [place] Director of the [facility]  Card[facility] Diplomate of the American Board of Clinical Lipidology Attending Cardiologist  Direct Dial: [phone]  Fax: [phone]  Website:  [email or web address] 05/02/2019, 9:54 AM

## 2019-05-06 ENCOUNTER — Other Ambulatory Visit: Payer: Self-pay | Admitting: Internal Medicine

## 2019-05-06 ENCOUNTER — Telehealth: Payer: Self-pay | Admitting: Internal Medicine

## 2019-05-06 DIAGNOSIS — I4892 Unspecified atrial flutter: Secondary | ICD-10-CM

## 2019-05-06 NOTE — Telephone Encounter (Signed)
New Message:     Pt said she have questions to ask please. No details were given.

## 2019-05-06 NOTE — Telephone Encounter (Signed)
Pt calling inquiring what she can take for loose stool with taking Eliquis. Per Pharm D, pt ok to take lomotil or Imodium. Pt verbalized understanding.

## 2019-05-10 ENCOUNTER — Telehealth: Payer: Self-pay | Admitting: Physician Assistant

## 2019-05-10 ENCOUNTER — Other Ambulatory Visit: Payer: Self-pay

## 2019-05-10 ENCOUNTER — Emergency Department (HOSPITAL_COMMUNITY)
Admission: EM | Admit: 2019-05-10 | Discharge: 2019-05-10 | Disposition: A | Discharge status: Home / Self Care | Payer: Medicare HMO | Source: Home / Self Care | Attending: Emergency Medicine | Admitting: Emergency Medicine

## 2019-05-10 ENCOUNTER — Encounter (HOSPITAL_COMMUNITY): Payer: Self-pay | Admitting: Emergency Medicine

## 2019-05-10 ENCOUNTER — Emergency Department (HOSPITAL_COMMUNITY): Payer: Medicare HMO

## 2019-05-10 DIAGNOSIS — Z85828 Personal history of other malignant neoplasm of skin: Secondary | ICD-10-CM | POA: Insufficient documentation

## 2019-05-10 DIAGNOSIS — E039 Hypothyroidism, unspecified: Secondary | ICD-10-CM | POA: Insufficient documentation

## 2019-05-10 DIAGNOSIS — I4891 Unspecified atrial fibrillation: Secondary | ICD-10-CM

## 2019-05-10 DIAGNOSIS — Z79899 Other long term (current) drug therapy: Secondary | ICD-10-CM | POA: Insufficient documentation

## 2019-05-10 DIAGNOSIS — Z7901 Long term (current) use of anticoagulants: Secondary | ICD-10-CM | POA: Insufficient documentation

## 2019-05-10 DIAGNOSIS — I10 Essential (primary) hypertension: Secondary | ICD-10-CM | POA: Insufficient documentation

## 2019-05-10 LAB — I-STAT CHEM 8, ED
BUN: 16 mg/dL (ref 8–23)
Calcium, Ion: 1.14 mmol/L — ABNORMAL LOW (ref 1.15–1.40)
Chloride: 108 mmol/L (ref 98–111)
Creatinine, Ser: 0.7 mg/dL (ref 0.44–1.00)
Glucose, Bld: 116 mg/dL — ABNORMAL HIGH (ref 70–99)
HCT: 44 % (ref 36.0–46.0)
Hemoglobin: 15 g/dL (ref 12.0–15.0)
Potassium: 3.3 mmol/L — ABNORMAL LOW (ref 3.5–5.1)
Sodium: 143 mmol/L (ref 135–145)
TCO2: 27 mmol/L (ref 22–32)

## 2019-05-10 MED ORDER — PROPOFOL 10 MG/ML IV BOLUS
INTRAVENOUS | Status: AC | PRN
  Administered 2019-05-10: 40 mg via INTRAVENOUS

## 2019-05-10 MED ORDER — PROPOFOL 10 MG/ML IV BOLUS
0.5000 mg/kg | Freq: Once | INTRAVENOUS | Status: AC
  Administered 2019-05-10: 10:00:00 37.6 mg via INTRAVENOUS
  Filled 2019-05-10: qty 20

## 2019-05-10 MED ORDER — METOPROLOL TARTRATE 5 MG/5ML IV SOLN
INTRAVENOUS | Status: AC
  Filled 2019-05-10: qty 5

## 2019-05-10 MED ORDER — METOPROLOL TARTRATE 5 MG/5ML IV SOLN
INTRAVENOUS | Status: AC | PRN
  Administered 2019-05-10: 2.5 mg via INTRAVENOUS

## 2019-05-10 MED ORDER — POTASSIUM CHLORIDE 20 MEQ/15ML (10%) PO SOLN
20.0000 meq | Freq: Once | ORAL | Status: AC
  Administered 2019-05-10: 20 meq via ORAL
  Filled 2019-05-10: qty 15

## 2019-05-10 NOTE — Sedation Documentation (Signed)
Patient denies pain and is resting comfortably.  

## 2019-05-10 NOTE — ED Notes (Signed)
Signed paper consent for both Cardioversion and procedural sedation at the bedside.

## 2019-05-10 NOTE — ED Provider Notes (Addendum)
[place] EMERGENCY DEPARTMENT Provider Note   CSN: [id number] Arrival date & time: 05/10/19  [id number]     History Chief Complaint  Patient presents with  . Atrial Fibrillation    Bridget [name] is a 79 y.o. female.  HPI 79 year old female history of atrial flutter, status post cardioversion February 12, on Eliquis presents today with new onset of A. fib with RVR. She states that today she felt like the palpitations were back. She denies any chest pain. She did have some dyspnea. She states that she took her blood pressure at home and noted her's heart rate was 130. She has not been on Eliquis since February 9. She denies any headache, head injury, neck pain, loss of consciousness, nausea, vomiting, or Covid symptoms.    Past Medical History:  Diagnosis Date  . Allergy   . Arthritis    hip OA  . Degenerative disc disease   . GERD (gastroesophageal reflux disease)   . Hyperlipidemia   . Hypertension   . Hypothyroidism   . Migraines   . Neuromuscular disorder ([place])   . Neuropathy   . Seasonal allergies   . 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 Atrial flutter ([place])   . Back pain 04/18/2015  . Lumbar herniated disc 11/13/2014  . Peripheral polyneuropathy 07/30/2013  . Migraine headache 07/30/2013  . Neck pain 07/10/2013  . ADENOMATOUS COLONIC POLYP 11/12/2008  . Hypothyroidism 08/22/2007  . Lumbar radiculopathy 08/22/2007  . Disorder of joint 08/22/2007  . Arthropathia 08/22/2007  . Hyperlipidemia 08/21/2007  . Essential hypertension 08/21/2007  . Gastroesophageal reflux disease 08/21/2007  . Diverticulosis of large intestine 08/21/2007  . Headache 08/21/2007  . History of colonic polyps 08/21/2007  . History of disease 08/21/2007    Past Surgical History:  Procedure Laterality Date  . CARDIOVERSION N/A 05/02/2019   Procedure: CARDIOVERSION;  Surgeon: Pixie Casino, MD;   Location: [facility] ENDOSCOPY;  Service: Cardiovascular;  Laterality: N/A;  . CATARACT EXTRACTION, BILATERAL    . CERVICAL DISCECTOMY  2005   with fusion  . CHOLECYSTECTOMY  2006  . COLONOSCOPY    . LUMBAR LAMINECTOMY/DECOMPRESSION MICRODISCECTOMY Right 11/13/2014   Procedure: Right Lumbar four-five Microdiskectomy;  Surgeon: Leeroy Cha, MD;  Location: [place] NEURO ORS;  Service: Neurosurgery;  Laterality: Right;  right  . POLYPECTOMY    . 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 TEE WITHOUT CARDIOVERSION N/A 05/02/2019   Procedure: TRANSESOPHAGEAL ECHOCARDIOGRAM (TEE);  Surgeon: Pixie Casino, MD;  Location: [facility];  Service: Cardiovascular;  Laterality: N/A;  . TOTAL ABDOMINAL HYSTERECTOMY  1978  . TOTAL HIP ARTHROPLASTY Left 04/2011  . TRANSTHORACIC ECHOCARDIOGRAM  12/2008   EF=>55%, borderline conc LVH; trace MR; mod TR; trace AV regurg     OB History   No obstetric history on file.     Family History  Problem Relation Age of Onset  . Stroke Mother 9  . Coronary artery disease Father 87  . Colon polyps Father   . Colon cancer Cousin   . Esophageal cancer Cousin   . Stroke Maternal Grandfather   . Stroke Paternal Grandmother   . Breast cancer Paternal Grandmother   . Suicidality Daughter   . Rectal cancer Neg Hx   . Stomach cancer Neg Hx     Social History   Tobacco Use  . Smoking status: Never Smoker  . Smokeless tobacco: Never Used  Substance Use Topics  . Alcohol use: No  . Drug use: No    Home Medications Prior to Admission medications   Medication Sig Start Date End Date Taking? Authorizing Provider  AIMOVIG 140 MG/ML SOAJ Inject 140 mg into the skin every 30 (thirty) days. 04/28/19   [provider]  apixaban (ELIQUIS) 5 MG TABS tablet Take 1 tablet (5 mg total) by mouth 2 (two) times daily. 04/30/19   Pixie Casino, MD  aspirin-acetaminophen-caffeine (EXCEDRIN MIGRAINE) [phone] MG tablet Take 1 tablet by mouth every 8 (eight) hours as needed  for migraine.     [provider]  b complex vitamins tablet Take 1 tablet by mouth daily. With vitamin c 500 mg    [provider]  carvedilol (COREG) 6.25 MG tablet Take 1 tablet (6.25 mg total) by mouth 2 (two) times daily. 04/30/19 07/29/19  Hilty, Nadean Corwin, MD  Cholecalciferol (VITAMIN D-1000 MAX ST) 25 MCG (1000 UT) tablet Take 2,000 Units by mouth daily.     [provider]  gabapentin (NEURONTIN) 300 MG capsule Take 300 mg by mouth daily.     [provider]  imipramine (TOFRANIL) 25 MG tablet Take 25 mg by mouth at bedtime. 03/30/14   [provider]  irbesartan (AVAPRO) 75 MG tablet Take 1 tablet (75 mg total) by mouth daily. 11/21/18   Hilty, Nadean Corwin, MD  levothyroxine (SYNTHROID, LEVOTHROID) 75 MCG tablet Take 75 mcg by mouth daily before breakfast.    [provider]  metroNIDAZOLE (METROCREAM) 0.75 % cream Apply 1 application topically once a week.    [provider]  promethazine (PHENERGAN) 25 MG tablet Take 25 mg by mouth every 6 (six) hours as needed for nausea.  11/13/17   [provider]  rosuvastatin (CRESTOR) 10 MG tablet Take 1 tablet (10 mg total) by mouth daily. Patient not taking: Reported on 04/30/2019 04/14/14   Pixie Casino, MD  terbinafine (LAMISIL) 250 MG tablet Take 250 mg by mouth daily.    [provider]  traMADol (ULTRAM) 50 MG tablet Take 50-100 mg by mouth every 4 (four) hours as needed for moderate pain. as needed for migraine pain and neuropathy. 03/09/16   [provider]  zinc gluconate 50 MG tablet Take 50 mg by mouth daily.    [provider]    Allergies    Codeine, Sulfonamide derivatives, Triptans, and Amlodipine  Review of Systems   Review of Systems  All other systems reviewed and are negative.   Physical Exam Updated Vital Signs BP (!) 154/98 (BP Location: Right Arm)   Pulse (!) 163   Temp 98.3 F (36.8 C) (Oral)   Resp (!) 25   LMP  (LMP  Unknown)   SpO2 98%   Physical Exam Vitals and nursing note reviewed.  Constitutional:      General: She is not in acute distress.    Appearance: Normal appearance. She is normal weight. She is not ill-appearing.  HENT:     Head: Normocephalic and atraumatic.     Right Ear: External ear normal.     Left Ear: External ear normal.     Nose: Nose normal.     Mouth/Throat:     Mouth: Mucous membranes are moist.     Pharynx: Oropharynx is clear.     Comments: Good mouth opening Dentition appears intact Eyes:     Extraocular Movements: Extraocular movements intact.     Pupils: Pupils are equal, round, and  reactive to light.  Cardiovascular:     Rate and Rhythm: Tachycardia present. Rhythm irregular.  Pulmonary:     Effort: Pulmonary effort is normal.     Breath sounds: Normal breath sounds.  Abdominal:     General: Abdomen is flat.     Palpations: Abdomen is soft.  Musculoskeletal:        General: Normal range of motion.     Cervical back: Normal range of motion.  Skin:    General: Skin is warm and dry.     Capillary Refill: Capillary refill takes less than 2 seconds.  Neurological:     General: No focal deficit present.     Mental Status: She is alert and oriented to person, place, and time.  Psychiatric:        Mood and Affect: Mood normal.        Behavior: Behavior normal.     ED Results / Procedures / Treatments   Labs (all labs ordered are listed, but only abnormal results are displayed) Labs Reviewed - No data to display  EKG None ED ECG REPORT   Date: 05/10/2019  Rate: 149  Rhythm: atrial fibrillation  QRS Axis: indeterminate  Intervals: normal  ST/T Wave abnormalities: nonspecific ST changes  Conduction Disutrbances:none  Narrative Interpretation:   Old EKG Reviewed: changes noted  I have personally reviewed the EKG tracing and agree with the computerized printout as noted.  Radiology DG Chest Port 1 View  Result Date: 05/10/2019 CLINICAL DATA:   Dyspnea. EXAM: PORTABLE CHEST 1 VIEW COMPARISON:  06/22/2003 FINDINGS: Lungs are adequately inflated without focal airspace consolidation or effusion. Borderline cardiomegaly. Fusion hardware intact over the cervical spine. Mild degenerative change of the thoracic spine. IMPRESSION: No acute cardiopulmonary disease. Borderline cardiomegaly. Electronically Signed   By: Marin Olp M.D.   On: 05/10/2019 10:17   ED ECG REPORT   Date: 05/10/2019  Rate: 150  Rhythm: atrial fibrillation  QRS Axis: indeterminate  Intervals: normal  ST/T Wave abnormalities: nonspecific ST changes  Conduction Disutrbances:none  Narrative Interpretation:   Old EKG Reviewed: changes noted  I have personally reviewed the EKG tracing and agree with the computerized printout as noted.  Procedures .Cardioversion  Date/Time: 05/10/2019 10:34 AM Performed by: Pattricia Boss, MD Authorized by: Pattricia Boss, MD   Consent:    Consent obtained:  Written   Consent given by:  Patient   Risks discussed:  Cutaneous burn, death, induced arrhythmia and pain   Alternatives discussed:  Alternative treatment Pre-procedure details:    Cardioversion basis:  Emergent   Rhythm:  Atrial fibrillation   Electrode placement:  Anterior-posterior Patient sedated: Yes. Refer to sedation procedure documentation for details of sedation.  Attempt one:    Cardioversion mode:  Synchronous   Waveform:  Biphasic   Shock (Joules):  120   Shock outcome:  Conversion to normal sinus rhythm Post-procedure details:    Patient status:  Alert   Patient tolerance of procedure:  Tolerated well, no immediate complications  .Critical Care Performed by: Pattricia Boss, MD Authorized by: Pattricia Boss, MD   Critical care provider statement:    Critical care time (minutes):  45   Critical care was necessary to treat or prevent imminent or life-threatening deterioration of the following conditions:  Cardiac failure   Critical care was time spent  personally by me on the following activities:  Discussions with consultants, evaluation of patient's response to treatment, examination of patient, ordering and performing treatments and interventions, ordering and review  of laboratory studies, ordering and review of radiographic studies, pulse oximetry, re-evaluation of patient's condition, obtaining history from patient or surrogate and review of old charts   (including critical care time)   Medications Ordered in ED Medications - No data to display  ED Course  I have reviewed the triage vital signs and the nursing notes.  Pertinent labs & imaging results that were available during my care of the patient were reviewed by me and considered in my medical decision making (see chart for details). Patient presents with A. fib with RVR. Cardioversion done in ED Please see cardioversion note. Please see sedation note by separate provider Discussed with husband. Patient did not take a.m. beta-blocker and was given 2.5 of Lopressor IV here in ED. Patient appears stable with repeat EKG with sinus rhythm.    ED ECG REPORT   Date: 05/10/2019  Rate: 69  Rhythm: normal sinus rhythm  QRS Axis: left  Intervals: normal  ST/T Wave abnormalities: nonspecific T wave changes  Conduction Disutrbances:none  Narrative Interpretation:   Old EKG Reviewed: changes noted  I have personally reviewed the EKG tracing and agree with the computerized printout as noted.  MDM Rules/Calculators/A&P                     CHA2DS2/VAS Stroke Risk Points  Current as of 7 minutes ago     4 >= 2 Points: High Risk  1 - 1.99 Points: Medium Risk  0 Points: Low Risk    The patient's score has not changed in the past year.: No Change     Details    This score determines the patient's risk of having a stroke if the  patient has atrial fibrillation.       Points Metrics  0 Has Congestive Heart Failure:  No    Current as of 7 minutes ago  0 Has Vascular Disease:  No     Current as of 7 minutes ago  1 Has Hypertension:  Yes    Current as of 7 minutes ago  2 Age:  43    Current as of 7 minutes ago  0 Has Diabetes:  No    Current as of 7 minutes ago  0 Had Stroke:  No  Had TIA:  No  Had thromboembolism:  No    Current as of 7 minutes ago  1 Female:  Yes    Current as of 7 minutes ago       Patient on eliquis  79 year old female presents today with A. fib RVR Patient on Eliquis with recent a flutter cardioversion. Patient did not have a.m. dose of beta-blocker and given 2.5 of Lopressor here. Plan discharged home in improved condition.  She is advised regarding continuing her medications, return precautions, need for follow-up with her cardiologist. 11:14 AM Patient awake and alert.  Patient heart remains in normal sinus rhythm with normal blood pressure on the monitor.  I have discussed discharge, precautions and need for follow-up with the patient and her wife and they voiced understanding. Final Clinical Impression(s) / ED Diagnoses Final diagnoses:  Atrial fibrillation with RVR [facility])    Rx / DC Orders ED Discharge Orders    None       Pattricia Boss, MD 05/10/19 1115    Pattricia Boss, MD 05/28/19 1302

## 2019-05-10 NOTE — ED Notes (Signed)
Patient verbalizes understanding of discharge instructions. Opportunity for questioning and answers were provided. Armband removed by staff, pt discharged from ED. Wheeled out to lobby  

## 2019-05-10 NOTE — Discharge Instructions (Addendum)
Please do not drive, operate heavy machinery, weight or walk unassisted today as you received sedation for your procedure in the emergency department. Call your cardiologist Monday for recheck. Return to the emergency department if you are having any worsening of symptoms.

## 2019-05-10 NOTE — Sedation Documentation (Signed)
synconized cardioversion at 120j

## 2019-05-10 NOTE — Sedation Documentation (Signed)
Pt's husband, Rush Landmark, was called for an update. This RN confirmed that Rush Landmark will drive the pt home.

## 2019-05-10 NOTE — ED Provider Notes (Signed)
.  Sedation  Date/Time: 05/10/2019 10:26 AM Performed by: Quintella Reichert, MD Authorized by: Quintella Reichert, MD   Consent:    Consent obtained:  Verbal   Consent given by:  Patient   Risks discussed:  Allergic reaction, dysrhythmia, inadequate sedation, nausea, prolonged hypoxia resulting in organ damage, prolonged sedation necessitating reversal, respiratory compromise necessitating ventilatory assistance and intubation and vomiting   Alternatives discussed:  Analgesia without sedation, anxiolysis and regional anesthesia Universal protocol:    Procedure explained and questions answered to patient or proxy's satisfaction: yes     Relevant documents present and verified: yes     Test results available and properly labeled: yes     Imaging studies available: yes     Required blood products, implants, devices, and special equipment available: yes     Site/side marked: yes     Immediately prior to procedure a time out was called: yes     Patient identity confirmation method:  Verbally with patient and arm band Indications:    Procedure performed:  Cardioversion   Procedure necessitating sedation performed by:  Different physician Pre-sedation assessment:    Time since last food or drink:  6 hrs   ASA classification: class 3 - patient with severe systemic disease     Neck mobility: normal     Mouth opening:  3 or more finger widths   Thyromental distance:  4 finger widths   Mallampati score:  II - soft palate, uvula, fauces visible   Pre-sedation assessments completed and reviewed: airway patency, cardiovascular function, hydration status, mental status, nausea/vomiting, pain level, respiratory function and temperature   Immediate pre-procedure details:    Reassessment: Patient reassessed immediately prior to procedure     Reviewed: vital signs, relevant labs/tests and NPO status     Verified: bag valve mask available, emergency equipment available, intubation equipment available, IV  patency confirmed, oxygen available and suction available   Procedure details (see MAR for exact dosages):    Preoxygenation:  Nasal cannula   Sedation:  Propofol   Intended level of sedation: deep   Intra-procedure monitoring:  Blood pressure monitoring, cardiac monitor, continuous pulse oximetry, frequent LOC assessments, frequent vital sign checks and continuous capnometry   Intra-procedure events: none     Total Provider sedation time (minutes):  10 Post-procedure details:    Attendance: Constant attendance by certified staff until patient recovered     Recovery: Patient returned to pre-procedure baseline     Post-sedation assessments completed and reviewed: airway patency, cardiovascular function, hydration status, mental status, nausea/vomiting, pain level, respiratory function and temperature     Patient is stable for discharge or admission: yes     Patient tolerance:  Tolerated well, no immediate complications      Quintella Reichert, MD 05/10/19 1215

## 2019-05-10 NOTE — ED Triage Notes (Signed)
Pt woke up during the middle of the night and felt like her heart wasn't right. Endorses SOB. Pt took her heart rate at home and it was 140bpm. Pt alert and oriented.

## 2019-05-10 NOTE — Telephone Encounter (Signed)
   The patient called the answering service after-hours today. Chart reviewed. Recently seen in the office with new onset atrial flutter associated with heart failure. Underwent TEE/DCCV on 2/12, EF 30-35%. She called in today because she has felt herself go back out of rhythm, states "I just feel miserable and short of breath when this happens." HR 131 by home monitor. Suspect at this point she likely needs initiation of antiarrhythmic therapy. Given symptoms, cardiomyopathy and ongoing tachycardia have recommended she proceed to ED for further evaluation. The patient verbalized understanding and gratitude. She knows calling EMS is an option but plans to have her husband drive her.  Charlie Pitter, PA-C

## 2019-05-10 NOTE — ED Notes (Signed)
Pt placed on ZOLL pads 

## 2019-05-12 ENCOUNTER — Telehealth: Payer: Self-pay | Admitting: Internal Medicine

## 2019-05-12 ENCOUNTER — Other Ambulatory Visit: Payer: Self-pay

## 2019-05-12 ENCOUNTER — Encounter: Payer: Self-pay | Admitting: Adult Health

## 2019-05-12 ENCOUNTER — Inpatient Hospital Stay (HOSPITAL_COMMUNITY): Payer: Medicare HMO

## 2019-05-12 ENCOUNTER — Encounter: Payer: Medicare HMO | Admitting: Adult Health

## 2019-05-12 ENCOUNTER — Inpatient Hospital Stay (HOSPITAL_COMMUNITY)
Admission: AD | Admit: 2019-05-12 | Discharge: 2019-05-14 | DRG: 308 | Disposition: A | Discharge status: Home / Self Care | Payer: Medicare HMO | Source: Ambulatory Visit | Attending: Internal Medicine | Admitting: Internal Medicine

## 2019-05-12 ENCOUNTER — Other Ambulatory Visit: Payer: Self-pay | Admitting: Adult Health

## 2019-05-12 VITALS — BP 132/86 | HR 157 | Ht 66.0 in | Wt 165.2 lb

## 2019-05-12 DIAGNOSIS — I5021 Acute systolic (congestive) heart failure: Secondary | ICD-10-CM | POA: Diagnosis present

## 2019-05-12 DIAGNOSIS — Z79899 Other long term (current) drug therapy: Secondary | ICD-10-CM

## 2019-05-12 DIAGNOSIS — Z96642 Presence of left artificial hip joint: Secondary | ICD-10-CM | POA: Diagnosis present

## 2019-05-12 DIAGNOSIS — Z96651 Presence of right artificial knee joint: Secondary | ICD-10-CM | POA: Diagnosis present

## 2019-05-12 DIAGNOSIS — Z888 Allergy status to other drugs, medicaments and biological substances status: Secondary | ICD-10-CM

## 2019-05-12 DIAGNOSIS — G8929 Other chronic pain: Secondary | ICD-10-CM | POA: Diagnosis present

## 2019-05-12 DIAGNOSIS — Z9842 Cataract extraction status, left eye: Secondary | ICD-10-CM | POA: Diagnosis not present

## 2019-05-12 DIAGNOSIS — Z981 Arthrodesis status: Secondary | ICD-10-CM | POA: Diagnosis not present

## 2019-05-12 DIAGNOSIS — Z882 Allergy status to sulfonamides status: Secondary | ICD-10-CM

## 2019-05-12 DIAGNOSIS — M549 Dorsalgia, unspecified: Secondary | ICD-10-CM | POA: Diagnosis present

## 2019-05-12 DIAGNOSIS — G43909 Migraine, unspecified, not intractable, without status migrainosus: Secondary | ICD-10-CM | POA: Diagnosis present

## 2019-05-12 DIAGNOSIS — Z8249 Family history of ischemic heart disease and other diseases of the circulatory system: Secondary | ICD-10-CM

## 2019-05-12 DIAGNOSIS — I4891 Unspecified atrial fibrillation: Secondary | ICD-10-CM

## 2019-05-12 DIAGNOSIS — I509 Heart failure, unspecified: Secondary | ICD-10-CM

## 2019-05-12 DIAGNOSIS — Z79891 Long term (current) use of opiate analgesic: Secondary | ICD-10-CM

## 2019-05-12 DIAGNOSIS — I4892 Unspecified atrial flutter: Secondary | ICD-10-CM | POA: Diagnosis present

## 2019-05-12 DIAGNOSIS — E038 Other specified hypothyroidism: Secondary | ICD-10-CM | POA: Diagnosis not present

## 2019-05-12 DIAGNOSIS — Z823 Family history of stroke: Secondary | ICD-10-CM

## 2019-05-12 DIAGNOSIS — Z7901 Long term (current) use of anticoagulants: Secondary | ICD-10-CM

## 2019-05-12 DIAGNOSIS — Z885 Allergy status to narcotic agent status: Secondary | ICD-10-CM

## 2019-05-12 DIAGNOSIS — I428 Other cardiomyopathies: Secondary | ICD-10-CM | POA: Diagnosis present

## 2019-05-12 DIAGNOSIS — M1990 Unspecified osteoarthritis, unspecified site: Secondary | ICD-10-CM | POA: Diagnosis present

## 2019-05-12 DIAGNOSIS — E039 Hypothyroidism, unspecified: Secondary | ICD-10-CM | POA: Diagnosis present

## 2019-05-12 DIAGNOSIS — G629 Polyneuropathy, unspecified: Secondary | ICD-10-CM | POA: Diagnosis present

## 2019-05-12 DIAGNOSIS — I110 Hypertensive heart disease with heart failure: Secondary | ICD-10-CM | POA: Diagnosis present

## 2019-05-12 DIAGNOSIS — Z86010 Personal history of colonic polyps: Secondary | ICD-10-CM

## 2019-05-12 DIAGNOSIS — Z20822 Contact with and (suspected) exposure to covid-19: Secondary | ICD-10-CM | POA: Diagnosis present

## 2019-05-12 DIAGNOSIS — I4819 Other persistent atrial fibrillation: Secondary | ICD-10-CM | POA: Diagnosis present

## 2019-05-12 DIAGNOSIS — Z79890 Hormone replacement therapy (postmenopausal): Secondary | ICD-10-CM

## 2019-05-12 DIAGNOSIS — Z9841 Cataract extraction status, right eye: Secondary | ICD-10-CM | POA: Diagnosis not present

## 2019-05-12 DIAGNOSIS — I10 Essential (primary) hypertension: Secondary | ICD-10-CM | POA: Diagnosis present

## 2019-05-12 DIAGNOSIS — R000 Tachycardia, unspecified: Secondary | ICD-10-CM | POA: Diagnosis present

## 2019-05-12 DIAGNOSIS — Z90710 Acquired absence of both cervix and uterus: Secondary | ICD-10-CM

## 2019-05-12 DIAGNOSIS — E785 Hyperlipidemia, unspecified: Secondary | ICD-10-CM | POA: Diagnosis present

## 2019-05-12 DIAGNOSIS — Z803 Family history of malignant neoplasm of breast: Secondary | ICD-10-CM

## 2019-05-12 DIAGNOSIS — I483 Typical atrial flutter: Secondary | ICD-10-CM | POA: Diagnosis not present

## 2019-05-12 DIAGNOSIS — Z800 Family history of malignant neoplasm of digestive organs: Secondary | ICD-10-CM

## 2019-05-12 DIAGNOSIS — Z8371 Family history of colonic polyps: Secondary | ICD-10-CM

## 2019-05-12 DIAGNOSIS — E782 Mixed hyperlipidemia: Secondary | ICD-10-CM | POA: Diagnosis not present

## 2019-05-12 DIAGNOSIS — Z818 Family history of other mental and behavioral disorders: Secondary | ICD-10-CM

## 2019-05-12 LAB — COMPREHENSIVE METABOLIC PANEL
ALT: 18 U/L (ref 0–44)
AST: 19 U/L (ref 15–41)
Albumin: 3.6 g/dL (ref 3.5–5.0)
Alkaline Phosphatase: 63 U/L (ref 38–126)
Anion gap: 9 (ref 5–15)
BUN: 13 mg/dL (ref 8–23)
CO2: 27 mmol/L (ref 22–32)
Calcium: 9.6 mg/dL (ref 8.9–10.3)
Chloride: 108 mmol/L (ref 98–111)
Creatinine, Ser: 0.79 mg/dL (ref 0.44–1.00)
GFR calc Af Amer: >60 mL/min (ref >60)
GFR calc non Af Amer: >60 mL/min (ref >60)
Glucose, Bld: 112 mg/dL — ABNORMAL HIGH (ref 70–99)
Potassium: 4.7 mmol/L (ref 3.5–5.1)
Sodium: 144 mmol/L (ref 135–145)
Total Bilirubin: 0.7 mg/dL (ref 0.3–1.2)
Total Protein: 6.3 g/dL — ABNORMAL LOW (ref 6.5–8.1)

## 2019-05-12 LAB — CBC WITH DIFFERENTIAL/PLATELET
Abs Immature Granulocytes: 0.02 10*3/uL (ref 0.00–0.07)
Basophils Absolute: 0 10*3/uL (ref 0.0–0.1)
Basophils Relative: 1 %
Eosinophils Absolute: 0.1 10*3/uL (ref 0.0–0.5)
Eosinophils Relative: 2 %
HCT: 43.6 % (ref 36.0–46.0)
Hemoglobin: 13.6 g/dL (ref 12.0–15.0)
Immature Granulocytes: 0 %
Lymphocytes Relative: 29 %
Lymphs Abs: 1.9 10*3/uL (ref 0.7–4.0)
MCH: 28.9 pg (ref 26.0–34.0)
MCHC: 31.2 g/dL (ref 30.0–36.0)
MCV: 92.8 fL (ref 80.0–100.0)
Monocytes Absolute: 0.7 10*3/uL (ref 0.1–1.0)
Monocytes Relative: 10 %
Neutro Abs: 4 10*3/uL (ref 1.7–7.7)
Neutrophils Relative %: 58 %
Platelets: 197 10*3/uL (ref 150–400)
RBC: 4.7 MIL/uL (ref 3.87–5.11)
RDW: 14.3 % (ref 11.5–15.5)
WBC: 6.7 10*3/uL (ref 4.0–10.5)
nRBC: 0 % (ref 0.0–0.2)

## 2019-05-12 LAB — BRAIN NATRIURETIC PEPTIDE: B Natriuretic Peptide: 228.8 pg/mL — ABNORMAL HIGH (ref 0.0–100.0)

## 2019-05-12 LAB — SARS CORONAVIRUS 2 (TAT 6-24 HRS): SARS Coronavirus 2: NEGATIVE

## 2019-05-12 LAB — MAGNESIUM: Magnesium: 2.1 mg/dL (ref 1.7–2.4)

## 2019-05-12 LAB — TSH: TSH: 4.703 u[IU]/mL — ABNORMAL HIGH (ref 0.350–4.500)

## 2019-05-12 MED ORDER — TRAMADOL HCL 50 MG PO TABS
50.0000 mg | ORAL_TABLET | Freq: Once | ORAL | Status: AC
  Administered 2019-05-12: 50 mg via ORAL
  Filled 2019-05-12: qty 1

## 2019-05-12 MED ORDER — ASPIRIN 300 MG RE SUPP: 300.0000 mg | RECTAL | Status: DC

## 2019-05-12 MED ORDER — ASPIRIN 81 MG PO CHEW: 324.0000 mg | CHEWABLE_TABLET | ORAL | Status: DC

## 2019-05-12 MED ORDER — APIXABAN 5 MG PO TABS
5.0000 mg | ORAL_TABLET | Freq: Two times a day (BID) | ORAL | Status: DC
  Administered 2019-05-12 – 2019-05-14 (×4): 5 mg via ORAL
  Filled 2019-05-12 (×4): qty 1

## 2019-05-12 MED ORDER — ZOLPIDEM TARTRATE 5 MG PO TABS: 5.0000 mg | ORAL_TABLET | Freq: Every evening | ORAL | Status: DC | PRN

## 2019-05-12 MED ORDER — NITROGLYCERIN 0.4 MG SL SUBL: 0.4000 mg | SUBLINGUAL_TABLET | SUBLINGUAL | Status: DC | PRN

## 2019-05-12 MED ORDER — ROSUVASTATIN CALCIUM 5 MG PO TABS
10.0000 mg | ORAL_TABLET | Freq: Every day | ORAL | Status: DC
  Administered 2019-05-12: 10 mg via ORAL
  Filled 2019-05-12: qty 2

## 2019-05-12 MED ORDER — CARVEDILOL 6.25 MG PO TABS
6.2500 mg | ORAL_TABLET | Freq: Two times a day (BID) | ORAL | Status: DC
  Administered 2019-05-12 – 2019-05-14 (×4): 6.25 mg via ORAL
  Filled 2019-05-12 (×4): qty 1

## 2019-05-12 MED ORDER — ASPIRIN EC 81 MG PO TBEC: 81.0000 mg | DELAYED_RELEASE_TABLET | Freq: Every day | ORAL | Status: DC

## 2019-05-12 MED ORDER — AMIODARONE HCL IN DEXTROSE 360-4.14 MG/200ML-% IV SOLN
30.0000 mg/h | INTRAVENOUS | Status: DC
  Administered 2019-05-12 – 2019-05-14 (×4): 30 mg/h via INTRAVENOUS
  Filled 2019-05-12 (×4): qty 200

## 2019-05-12 MED ORDER — LEVOTHYROXINE SODIUM 75 MCG PO TABS
75.0000 ug | ORAL_TABLET | Freq: Every day | ORAL | Status: DC
  Administered 2019-05-13 – 2019-05-14 (×2): 75 ug via ORAL
  Filled 2019-05-12 (×2): qty 1

## 2019-05-12 MED ORDER — ONDANSETRON HCL 4 MG/2ML IJ SOLN: 4.0000 mg | Freq: Four times a day (QID) | INTRAMUSCULAR | Status: DC | PRN

## 2019-05-12 MED ORDER — AMIODARONE LOAD VIA INFUSION
150.0000 mg | Freq: Once | INTRAVENOUS | Status: AC
  Administered 2019-05-12: 150 mg via INTRAVENOUS
  Filled 2019-05-12: qty 83.34

## 2019-05-12 MED ORDER — AMIODARONE HCL IN DEXTROSE 360-4.14 MG/200ML-% IV SOLN
60.0000 mg/h | INTRAVENOUS | Status: AC
  Administered 2019-05-12 (×2): 60 mg/h via INTRAVENOUS
  Filled 2019-05-12: qty 200

## 2019-05-12 MED ORDER — FUROSEMIDE 20 MG PO TABS
20.0000 mg | ORAL_TABLET | Freq: Every day | ORAL | Status: DC
  Administered 2019-05-12 – 2019-05-13 (×2): 20 mg via ORAL
  Filled 2019-05-12 (×2): qty 1

## 2019-05-12 MED ORDER — ATORVASTATIN CALCIUM 10 MG PO TABS: 10.0000 mg | ORAL_TABLET | Freq: Every day | ORAL | Status: DC

## 2019-05-12 MED ORDER — ACETAMINOPHEN 325 MG PO TABS
650.0000 mg | ORAL_TABLET | ORAL | Status: DC | PRN
  Administered 2019-05-12: 650 mg via ORAL
  Filled 2019-05-12: qty 2

## 2019-05-12 NOTE — Plan of Care (Signed)
  Problem: Education: Goal: Ability to verbalize understanding of medication therapies will improve Outcome: Progressing   Problem: Cardiac: Goal: Ability to achieve and maintain adequate cardiopulmonary perfusion will improve Outcome: Progressing   

## 2019-05-12 NOTE — Progress Notes (Signed)
  Amiodarone Drug - Drug Interaction Consult Note  Recommendations: Monitor for muscle pain/weakness while on atorvastatin Monitor for bradycardia, AV block, myocardial depression while on carvedilol Monitor for QTc prolongation while on ondansetron  Amiodarone is metabolized by the cytochrome P450 system and therefore has the potential to cause many drug interactions. Amiodarone has an average plasma half-life of 50 days (range 20 to 100 days).   There is potential for drug interactions to occur several weeks or months after stopping treatment and the onset of drug interactions may be slow after initiating amiodarone.   [x]  Statins: Increased risk of myopathy. Simvastatin- restrict dose to 20mg  daily. Other statins: counsel patients to report any muscle pain or weakness immediately.  []  Anticoagulants: Amiodarone can increase anticoagulant effect. Consider warfarin dose reduction. Patients should be monitored closely and the dose of anticoagulant altered accordingly, remembering that amiodarone levels take several weeks to stabilize.  []  Antiepileptics: Amiodarone can increase plasma concentration of phenytoin, the dose should be reduced. Note that small changes in phenytoin dose can result in large changes in levels. Monitor patient and counsel on signs of toxicity.  [x]  Beta blockers: increased risk of bradycardia, AV block and myocardial depression. Sotalol - avoid concomitant use.  []   Calcium channel blockers (diltiazem and verapamil): increased risk of bradycardia, AV block and myocardial depression.  []   Cyclosporine: Amiodarone increases levels of cyclosporine. Reduced dose of cyclosporine is recommended.  []  Digoxin dose should be halved when amiodarone is started.  []  Diuretics: increased risk of cardiotoxicity if hypokalemia occurs.  []  Oral hypoglycemic agents (glyburide, glipizide, glimepiride): increased risk of hypoglycemia. Patient's glucose levels should be monitored  closely when initiating amiodarone therapy.   [x]  Drugs that prolong the QT interval:  Torsades de pointes risk may be increased with concurrent use - avoid if possible.  Monitor QTc, also keep magnesium/potassium WNL if concurrent therapy can't be avoided. Marland Kitchen Antibiotics: e.g. fluoroquinolones, erythromycin. . Antiarrhythmics: e.g. quinidine, procainamide, disopyramide, sotalol. . Antipsychotics: e.g. phenothiazines, haloperidol.  . Lithium, tricyclic antidepressants, and methadone.  Ondansetron  Thank You,  Gillermina Hu, PharmD, BCPS, [facility] Clinical Pharmacist 05/12/2019 5:45 PM

## 2019-05-12 NOTE — Telephone Encounter (Signed)
Patient c/o Palpitations:  High priority if patient c/o lightheadedness, shortness of breath, or chest pain  1) How long have you had palpitations/irregular HR/ Afib? Are you having the symptoms now? Has been occurring for the past 15 minutes symptoms are present now   2) Are you currently experiencing lightheadedness, SOB or CP? No   3) Do you have a history of afib (atrial fibrillation) or irregular heart rhythm? Yes   4) Have you checked your BP or HR? (document readings if available): HR 148   5) Are you experiencing any other symptoms? SOB   Patient is calling stating she is currently experiencing Afib. She states it has been occurring for the last 15 minutes and she is having SOB as well. The patient checked her HR while on the phone and it read to be 148. Please advise.

## 2019-05-12 NOTE — Progress Notes (Signed)
Error, Patient admitted.

## 2019-05-12 NOTE — Telephone Encounter (Signed)
See my Epic conversation - recommend start amiodarone 200 mg BID and eval for suspected CHF - EF 30-35%, likely needs diuretic. APPt with Camn[facility] on 3/1.  Dr Lemmie Evens

## 2019-05-12 NOTE — H&P (Signed)
CARDIOLOGY HISTORY AND PHYSICAL   Patient ID: LAKETRA TVEDT MRN: [id number]  DOB/AGE: 1940-05-31 79 y.o. Admit date: (Not on file)  Primary Care Physician: Marylen Ponto, MD Primary Cardiologist: Dr. Rennis Golden   Clinical Summary Ms. 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  She had been placed on Eliquis 5 mg twice daily prior to the procedure she did have a successful DCCV per Dr. Rennis Golden with an EF measuring 30% to 35% with no atrial thrombus noted.  Within 24 hours she was seen in the ED with atrial for with RVR and treated with cardioversion which was temporarily successful.    She called her office today with complaints of racing heart rate and shortness of breath and profound fatigue.  She was added to my office schedule for evaluation.  Dr. Rennis Golden was made aware of her symptoms prior to this appointment being made.  In the office today EKG revealed atrial fibrillation with RVR heart rate of 157 bpm with nonspecific ST-T wave abnormalities.  She is symptomatic with shortness of breath, and orthopnea.  She does not have any overt symptoms of CHF with exception of some orthopnea.  She stated that her heart rate is causing her to feel some mild chest pressure.  Blood pressure in the office 132/86, weight 165 pounds, pulse 157 bpm.  Further history includes hypertension, arthritis, (currently not on any NSAIDs), chronic back pain seen by Dr. Jeral Fruit on gabapentin 3 times daily, hypothyroidism on levothyroxine 75 mcg daily..   Allergies  Allergen Reactions  . Codeine Anaphylaxis  . Sulfonamide Derivatives Anaphylaxis  . 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 Amlodipine Other (See Comments)    swelling    Home Medications Apixaban 5 mg p.o. twice daily Carvedilol 6.25 mg twice  daily Gabapentin 300 mg daily Imipramine 25 mg at at bedtime Irbesartan 75 mg daily Levothyroxine 75 mcg daily Promethazine 25 mg tablet as needed every 6 hours nausea Rosuvastatin 10 mg daily Terbinafine 250 mcg daily Tramadol 50 mg 1 to 2 tablets every 4 hours as needed moderate pain. Vitamin D 1000 max, 25 mcg daily  Scheduled Medications    Infusions    PRN Medications    Past Medical History:  Diagnosis Date  . Allergy   . Arthritis    hip OA  . Degenerative disc disease   . GERD (gastroesophageal reflux disease)   . Hyperlipidemia   . Hypertension   . Hypothyroidism   . Migraines   . Neuromuscular disorder (HCC)   . Neuropathy   . Seasonal allergies   . 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 CARDIOVERSION N/A 05/02/2019   Procedure: CARDIOVERSION;  Surgeon: Chrystie Nose, MD;  Location: [facility] ENDOSCOPY;  Service: Cardiovascular;  Laterality: N/A;  . CATARACT EXTRACTION, BILATERAL    . CERVICAL DISCECTOMY  2005   with fusion  . CHOLECYSTECTOMY  2006  . COLONOSCOPY    . LUMBAR LAMINECTOMY/DECOMPRESSION MICRODISCECTOMY Right 11/13/2014   Procedure: Right Lumbar four-five Microdiskectomy;  Surgeon: Hilda Lias, MD;  Location: [facility] NEURO ORS;  Service: Neurosurgery;  Laterality: Right;  right  . POLYPECTOMY    . 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 TEE WITHOUT CARDIOVERSION N/A 05/02/2019  Procedure: TRANSESOPHAGEAL ECHOCARDIOGRAM (TEE);  Surgeon: Chrystie Nose, MD;  Location: [facility] ENDOSCOPY;  Service: Cardiovascular;  Laterality: N/A;  . TOTAL ABDOMINAL HYSTERECTOMY  1978  . TOTAL HIP ARTHROPLASTY Left 04/2011  . TRANSTHORACIC ECHOCARDIOGRAM  12/2008   EF=>55%, borderline conc LVH; trace MR; mod TR; trace AV regurg    Family History  Problem Relation Age of Onset  . Stroke Mother 61  . Coronary artery disease Father 23  . Colon polyps Father   . Colon cancer Cousin   . Esophageal cancer  Cousin   . Stroke Maternal Grandfather   . Stroke Paternal Grandmother   . Breast cancer Paternal Grandmother   . Suicidality Daughter   . Rectal cancer Neg Hx   . Stomach cancer Neg Hx     Social History Ms. Overbaugh reports that she has never smoked. She has never used smokeless tobacco. Ms. Moman reports no history of alcohol use.  Review of Systems Otherwise reviewed and negative except as outlined.  Physical Examination BP 132/86 HR 157 bpm, R 28. Weight 165 lbs. Ht 5'6""  Gen: Mildly dyspneic, slightly anxious HEENT: Conjunctiva and lids normal, oropharynx clear with moist mucosa. Neck: Supple, no elevated JVP or carotid bruits, no thyromegaly. Lungs: Clear to auscultation, nonlabored breathing at rest, orthopneic. Cardiac: Rapid irregular  rate and rhythm, no S3 or significant systolic murmur, no pericardial rub. Abdomen: Soft, nontender, no hepatomegaly, bowel sounds present, no guarding or rebound. Extremities: No pitting edema, distal pulses 2+. Skin: Warm and dry.  Pale Musculoskeletal: No kyphosis. Neuropsychiatric: Alert and oriented x3, affect grossly appropriate.   Lab Results  Basic Metabolic Panel: Recent Labs  Lab 05/10/19 0945  NA 143  K 3.3*  CL 108  GLUCOSE 116*  BUN 16  CREATININE 0.70    Liver Function Tests: No results for input(s): AST, ALT, ALKPHOS, BILITOT, PROT, ALBUMIN in the last 168 hours.  CBC: Recent Labs  Lab 05/10/19 0945  HGB 15.0  HCT 44.0    Cardiac Enzymes: No results for input(s): CKTOTAL, CKMB, CKMBINDEX, TROPONINI in the last 168 hours.  BNP: Invalid input(s): POCBNP   Radiology No results found.  Prior Cardiac Testing/Procedures:  05/02/2019 TEE 1. Left ventricular ejection fraction, by estimation, is 30 to 35%. The  left ventricle has moderately decreased function. The left ventricle  demonstrates global hypokinesis. The left ventricular internal cavity size  was mildly dilated. Left ventricular    diastolic function could not be evaluated.  2. Right ventricular systolic function is normal. The right ventricular  size is normal.  3. Left atrial size was mildly dilated. No left atrial/left atrial  appendage thrombus was detected.  4. The mitral valve is grossly normal. Mild mitral valve regurgitation.  5. The aortic valve is tricuspid. Aortic valve regurgitation is mild.  6. Cannot exclude small PFO.   ECG: Personally reviewed atrial fib with RVR heart rate of 57 bpm, left axis deviation with nonspecific ST-T wave abnormality noted.  Impression and Recommendations  1.  Persistent atrial fibrillation with RVR: Has had 2 cardioversions within the last 48 hours with return to atrial fib with RVR within 24 hours.  The patient will be admitted to progressive care unit started on amiodarone drip with transition to p.o. amiodarone.  She will continue on Eliquis 5 mg p.o. twice daily, labs to include TSH, repeat CBC, and LFTs will be completed for baseline information.  She will be followed by Dr. Rennis Golden who may consult EP at his discretion.  I  spoke with the patient and her husband who are here in the office visit today to explain the need to admit and planned medication management.  Multiple questions are answered.  They verbalized understanding and she is willing to be admitted.  2.  Hypertension: Remains on carvedilol 6.25 mg twice daily.  This may be needed to be adjusted based upon her response to amiodarone.  3.  Systolic dysfunction: Ejection fraction per TEE revealed EF of 30 to 35% while in atrial fibrillation with left ventricle demonstrating global hypokinesis.  She may be having evidence of decompensated CHF symptoms however they are not overt on exam.  Will check chest x-ray and BNP.  Add IV diuresis as necessary.  4.  Hypothyroidism: Check TSH, continue levothyroxine.  Hospitalist consultation at the discretion of admitting physician.  Signed: Bettey Mare. Liborio Nixon, ANP,  AACC  05/12/2019, 4:41 PM Co-Sign MD

## 2019-05-12 NOTE — Significant Event (Signed)
Rapid Response Event Note  Overview: MEWS - 4 -- Tachycardia  Nurse called to report MEWS 4 increased HR - AF RVR. MD had already ordered an Amiodarone infusion to be started. Patient is NOT symptomatic per nurse.   NO RRT INTERVENTIONS    Bridget [name], [place]

## 2019-05-12 NOTE — Telephone Encounter (Signed)
Notified pt that Dr.Hilty has been in touch with Jory Sims, NP and that they will discuss any medication changes or recommendations at her appointment today at 3:45. Pt agreeable with no other questions or concerns at this time.

## 2019-05-12 NOTE — Progress Notes (Addendum)
Received pt direct admit from MD's office, admitted d/t afib RVR. HR has been in the 160's. MEWS score 4.  Started on Amiodarone drip, seen by Dr. Debara Pickett. Rapid Response RN notified. Will monitor.

## 2019-05-12 NOTE — Telephone Encounter (Signed)
Pt calling in states she is in afib and experiencing "a lot of shortness of breath" Pt states that she was cardioverted on the 12th by Dr.Hilty, and that she went to the emergency room on Saturday because she was back in afib/aflutter and she was cardioverted again. Pt states she was coming into [facility] for another appt when it started up again. Pt very anxious and is requesting medication.  Offered pt an appt with APP today. Scheduled for today 05/12/19 at 3:45 with Jory Sims. Pt agreeable to this but is still asking for something to be done sooner. States she "needs something now."  Told pt message would be routed to Dr.Hilty for review and advice. Pt states she can be reached at [phone]

## 2019-05-12 NOTE — Telephone Encounter (Signed)
Thanks Dayna.  -Mali

## 2019-05-12 NOTE — H&P (Signed)
CARDIOLOGY HISTORY AND PHYSICAL   Patient ID: Bridget [name] MRN: [id number]  DOB/AGE: 1941/03/11 79 y.o. Admit date: (Not on file)  Primary Care Physician: Bridget Ponto, MD Primary Cardiologist: Dr. Rennis [name]   Clinical Summary 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  She had been placed on Eliquis 5 mg twice daily prior to the procedure she did have a successful DCCV per Dr. Rennis [name] with an EF measuring 30% to 35% with no atrial thrombus noted.  Within 24 hours she was seen in the ED with atrial for with RVR and treated with cardioversion which was temporarily successful.    She called her office today with complaints of racing heart rate and shortness of breath and profound fatigue.  She was added to my office schedule for evaluation.  Dr. Rennis [name] was made aware of her symptoms prior to this appointment being made.  In the office today EKG revealed atrial fibrillation with RVR heart rate of 157 bpm with nonspecific ST-T wave abnormalities.  She is symptomatic with shortness of breath, and orthopnea.  She does not have any overt symptoms of CHF with exception of some orthopnea.  She stated that her heart rate is causing her to feel some mild chest pressure.  Blood pressure in the office 132/86, weight 165 pounds, pulse 157 bpm.  Further history includes hypertension, arthritis, (currently not on any NSAIDs), chronic back pain seen by Dr. Jeral [name] on gabapentin 3 times daily, hypothyroidism on levothyroxine 75 mcg daily..   Allergies  Allergen Reactions  . Codeine Anaphylaxis  . Sulfonamide Derivatives Anaphylaxis  . 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 Amlodipine Other (See Comments)    swelling    Home Medications Apixaban 5 mg p.o. twice daily Carvedilol 6.25 mg twice  daily Gabapentin 300 mg daily Imipramine 25 mg at at bedtime Irbesartan 75 mg daily Levothyroxine 75 mcg daily Promethazine 25 mg tablet as needed every 6 hours nausea Rosuvastatin 10 mg daily Terbinafine 250 mcg daily Tramadol 50 mg 1 to 2 tablets every 4 hours as needed moderate pain. Vitamin D 1000 max, 25 mcg daily  Scheduled Medications    Infusions    PRN Medications    Past Medical History:  Diagnosis Date  . Allergy   . Arthritis    hip OA  . Degenerative disc disease   . GERD (gastroesophageal reflux disease)   . Hyperlipidemia   . Hypertension   . Hypothyroidism   . Migraines   . Neuromuscular disorder (HCC)   . Neuropathy   . Seasonal allergies   . 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 CARDIOVERSION N/A 05/02/2019   Procedure: CARDIOVERSION;  Surgeon: Bridget Nose, MD;  Location: [facility] ENDOSCOPY;  Service: Cardiovascular;  Laterality: N/A;  . CATARACT EXTRACTION, BILATERAL    . CERVICAL DISCECTOMY  2005   with fusion  . CHOLECYSTECTOMY  2006  . COLONOSCOPY    . LUMBAR LAMINECTOMY/DECOMPRESSION MICRODISCECTOMY Right 11/13/2014   Procedure: Right Lumbar four-five Microdiskectomy;  Surgeon: Bridget Lias, MD;  Location: [facility] NEURO ORS;  Service: Neurosurgery;  Laterality: Right;  right  . POLYPECTOMY    . 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 TEE WITHOUT CARDIOVERSION N/A 05/02/2019  Procedure: TRANSESOPHAGEAL ECHOCARDIOGRAM (TEE);  Surgeon: Bridget Nose, MD;  Location: [facility] ENDOSCOPY;  Service: Cardiovascular;  Laterality: N/A;  . TOTAL ABDOMINAL HYSTERECTOMY  1978  . TOTAL HIP ARTHROPLASTY Left 04/2011  . TRANSTHORACIC ECHOCARDIOGRAM  12/2008   EF=>55%, borderline conc LVH; trace MR; mod TR; trace AV regurg    Family History  Problem Relation Age of Onset  . Stroke Mother 99  . Coronary artery disease Father 29  . Colon polyps Father   . Colon cancer Cousin   . Esophageal cancer  Cousin   . Stroke Maternal Grandfather   . Stroke Paternal Grandmother   . Breast cancer Paternal Grandmother   . Suicidality Daughter   . Rectal cancer Neg Hx   . Stomach cancer Neg Hx     Social History [name] reports that she has never smoked. She has never used smokeless tobacco. [name] reports no history of alcohol use.  Review of Systems Otherwise reviewed and negative except as outlined.  Physical Examination BP 132/86 HR 157 bpm, R 28. Weight 165 lbs. Ht 5'6""  Gen: Mildly dyspneic, slightly anxious HEENT: Conjunctiva and lids normal, oropharynx clear with moist mucosa. Neck: Supple, no elevated JVP or carotid bruits, no thyromegaly. Lungs: Clear to auscultation, nonlabored breathing at rest, orthopneic. Cardiac: Rapid irregular  rate and rhythm, no S3 or significant systolic murmur, no pericardial rub. Abdomen: Soft, nontender, no hepatomegaly, bowel sounds present, no guarding or rebound. Extremities: No pitting edema, distal pulses 2+. Skin: Warm and dry.  Pale Musculoskeletal: No kyphosis. Neuropsychiatric: Alert and oriented x3, affect grossly appropriate.   Lab Results  Basic Metabolic Panel: Recent Labs  Lab 05/10/19 0945  NA 143  K 3.3*  CL 108  GLUCOSE 116*  BUN 16  CREATININE 0.70    Liver Function Tests: No results for input(s): AST, ALT, ALKPHOS, BILITOT, PROT, ALBUMIN in the last 168 hours.  CBC: Recent Labs  Lab 05/10/19 0945  HGB 15.0  HCT 44.0    Cardiac Enzymes: No results for input(s): CKTOTAL, CKMB, CKMBINDEX, TROPONINI in the last 168 hours.  BNP: Invalid input(s): POCBNP   Radiology No results found.  Prior Cardiac Testing/Procedures:  05/02/2019 TEE 1. Left ventricular ejection fraction, by estimation, is 30 to 35%. The  left ventricle has moderately decreased function. The left ventricle  demonstrates global hypokinesis. The left ventricular internal cavity size  was mildly dilated. Left ventricular    diastolic function could not be evaluated.  2. Right ventricular systolic function is normal. The right ventricular  size is normal.  3. Left atrial size was mildly dilated. No left atrial/left atrial  appendage thrombus was detected.  4. The mitral valve is grossly normal. Mild mitral valve regurgitation.  5. The aortic valve is tricuspid. Aortic valve regurgitation is mild.  6. Cannot exclude small PFO.   ECG: Personally reviewed atrial fib with RVR heart rate of 57 bpm, left axis deviation with nonspecific ST-T wave abnormality noted.  Impression and Recommendations  1.  Persistent atrial fibrillation with RVR: Has had 2 cardioversions within the last 48 hours with return to atrial fib with RVR within 24 hours.  The patient will be admitted to progressive care unit started on amiodarone drip with transition to p.o. amiodarone.  She will continue on Eliquis 5 mg p.o. twice daily, labs to include TSH, repeat CBC, and LFTs will be completed for baseline information.  She will be followed by Dr. Rennis [name] who may consult EP at his discretion. She will  be a direct admit to 3 E progressive care unit   I spoke with the patient and her husband who are here in the office visit today to explain the need to admit and planned medication management.  Multiple questions are answered.  They verbalized understanding and she is willing to be admitted.  2.  Hypertension: Remains on carvedilol 6.25 mg twice daily.  This may be needed to be adjusted based upon her response to amiodarone.  3.  Systolic dysfunction: Ejection fraction per TEE revealed EF of 30 to 35% while in atrial fibrillation with left ventricle demonstrating global hypokinesis.  She may be having evidence of decompensated CHF symptoms however they are not overt on exam.  Will check chest x-ray and BNP.  Add IV diuresis as necessary.  4.  Hypothyroidism: Check TSH, continue levothyroxine.  Hospitalist consultation at the discretion of  admitting physician.  Signed: Bettey Mare. Liborio Nixon, ANP, AACC  05/12/2019, 5:04 PM Co-Sign MD

## 2019-05-12 NOTE — Patient Instructions (Signed)
Pt being Admitted

## 2019-05-13 ENCOUNTER — Encounter (HOSPITAL_COMMUNITY): Payer: Self-pay | Admitting: Internal Medicine

## 2019-05-13 DIAGNOSIS — I4892 Unspecified atrial flutter: Secondary | ICD-10-CM

## 2019-05-13 DIAGNOSIS — I5021 Acute systolic (congestive) heart failure: Secondary | ICD-10-CM | POA: Diagnosis present

## 2019-05-13 DIAGNOSIS — E038 Other specified hypothyroidism: Secondary | ICD-10-CM

## 2019-05-13 LAB — LIPID PANEL
Cholesterol: 221 mg/dL — ABNORMAL HIGH (ref 0–200)
HDL: 48 mg/dL (ref >40)
LDL Cholesterol: 148 mg/dL — ABNORMAL HIGH (ref 0–99)
Total CHOL/HDL Ratio: 4.6 RATIO
Triglycerides: 124 mg/dL (ref <150)
VLDL: 25 mg/dL (ref 0–40)

## 2019-05-13 LAB — BASIC METABOLIC PANEL
Anion gap: 10 (ref 5–15)
BUN: 13 mg/dL (ref 8–23)
CO2: 24 mmol/L (ref 22–32)
Calcium: 9.2 mg/dL (ref 8.9–10.3)
Chloride: 107 mmol/L (ref 98–111)
Creatinine, Ser: 0.78 mg/dL (ref 0.44–1.00)
GFR calc Af Amer: >60 mL/min (ref >60)
GFR calc non Af Amer: >60 mL/min (ref >60)
Glucose, Bld: 124 mg/dL — ABNORMAL HIGH (ref 70–99)
Potassium: 3.8 mmol/L (ref 3.5–5.1)
Sodium: 141 mmol/L (ref 135–145)

## 2019-05-13 LAB — T4, FREE: Free T4: 1.06 ng/dL (ref 0.61–1.12)

## 2019-05-13 MED ORDER — TRAMADOL HCL 50 MG PO TABS
50.0000 mg | ORAL_TABLET | Freq: Four times a day (QID) | ORAL | Status: DC | PRN
  Administered 2019-05-13 – 2019-05-14 (×3): 50 mg via ORAL
  Filled 2019-05-13 (×3): qty 1

## 2019-05-13 MED ORDER — ROSUVASTATIN CALCIUM 20 MG PO TABS
20.0000 mg | ORAL_TABLET | Freq: Every day | ORAL | Status: DC
  Administered 2019-05-13: 20 mg via ORAL
  Filled 2019-05-13: qty 1

## 2019-05-13 NOTE — Plan of Care (Signed)
  Problem: Education: Goal: Ability to demonstrate management of disease process will improve Outcome: Progressing   Problem: Education: Goal: Ability to verbalize understanding of medication therapies will improve Outcome: Progressing   Problem: Clinical Measurements: Goal: Ability to maintain clinical measurements within normal limits will improve Outcome: Progressing   Problem: Clinical Measurements: Goal: Cardiovascular complication will be avoided Outcome: Progressing

## 2019-05-13 NOTE — Anesthesia Preprocedure Evaluation (Addendum)
Anesthesia Evaluation  Patient identified by MRN, date of birth, ID band Patient awake    Reviewed: Allergy & Precautions, NPO status , Patient's Chart, lab work & pertinent test results  Airway Mallampati: II  TM Distance: >3 FB Neck ROM: Full    Dental no notable dental hx. (+) Teeth Intact, Dental Advisory Given   Pulmonary    Pulmonary exam normal breath sounds clear to auscultation       Cardiovascular Exercise Tolerance: Good hypertension, Pt. on medications and Pt. on home beta blockers +CHF  Normal cardiovascular exam+ dysrhythmias Atrial Fibrillation  Rhythm:Regular Rate:Normal  05/02/19 Echo Ef 30-35%   Neuro/Psych  Headaches,  Neuromuscular disease    GI/Hepatic Neg liver ROS, GERD  Medicated,  Endo/Other  Hypothyroidism   Renal/GU negative Renal ROSK+ 3.8 Cr 0.78  negative genitourinary   Musculoskeletal negative musculoskeletal ROS (+)   Abdominal   Peds  Hematology hgb 13.6   Anesthesia Other Findings   Reproductive/Obstetrics                            Anesthesia Physical Anesthesia Plan  ASA: III  Anesthesia Plan: General   Post-op Pain Management:    Induction: Intravenous  PONV Risk Score and Plan: Treatment may vary due to age or medical condition  Airway Management Planned: Nasal Cannula and Natural Airway  Additional Equipment: None  Intra-op Plan:   Post-operative Plan:   Informed Consent: I have reviewed the patients History and Physical, chart, labs and discussed the procedure including the risks, benefits and alternatives for the proposed anesthesia with the patient or authorized representative who has indicated his/her understanding and acceptance.     Dental advisory given  Plan Discussed with: CRNA  Anesthesia Plan Comments:        Anesthesia Quick Evaluation

## 2019-05-13 NOTE — H&P (View-Only) (Signed)
ELECTROPHYSIOLOGY CONSULT NOTE    Patient ID: MEDIA ADEL MRN: [id number], DOB/AGE: Sep 23, 1940 79 y.o.  Admit date: 05/12/2019 Date of Consult: 05/13/2019  Primary Physician: Ronita Hipps, MD Primary Cardiologist: Dr. Debara Pickett Electrophysiologist: New   Referring Provider: Dr. Debara Pickett  Patient Profile: Bridget [name] is a 79 y.o. 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 Debara Pickett.  HPI:  Bridget [name] is a 79 y.o. female with medical history as above. Her new AFL has been treated with coreg and eliquis for CHA2DS2VASC of at least 5. She underwent TEE/DCCV 05/02/19 which was successful, but did show new systolic HF with EF 99991111 range. Thought to be tachy-mediated.  She had recurrent AFL and underwent DCCV 2/19, within 24 hours she had reported back to the ED with recurrent DCCV 2/20, which was again only temporarily successful.   She presented to office 05/12/2019 with racing HR and SOB with profound fatigue. ECG showed ? AFL vs coarse AF with RVR at rate of 157 bpm (not available). Admitted for amiodarone load with consideration for recurrent DCCV vs ablation consideration. EP asked to see for further recommendations.   She denies chest pain, PND, orthopnea, nausea, vomiting, dizziness, syncope, edema, weight gain, or early satiety at this time.   Past Medical History:  Diagnosis Date  . Allergy   . Arthritis    hip OA  . Degenerative disc disease   . GERD (gastroesophageal reflux disease)   . Hyperlipidemia   . Hypertension   . Hypothyroidism   . Migraines   . Neuromuscular disorder ([place])   . Neuropathy   . Seasonal allergies   . 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 CARDIOVERSION N/A 05/02/2019   Procedure: CARDIOVERSION;  Surgeon: Pixie Casino, MD;  Location: [facility] ENDOSCOPY;  Service: Cardiovascular;  Laterality: N/A;  . CATARACT EXTRACTION, BILATERAL    . CERVICAL DISCECTOMY  2005   with fusion  . CHOLECYSTECTOMY  2006  . COLONOSCOPY    . LUMBAR LAMINECTOMY/DECOMPRESSION MICRODISCECTOMY Right 11/13/2014   Procedure: Right Lumbar four-five Microdiskectomy;  Surgeon: Leeroy Cha, MD;  Location: [place] [facility]UR[facility]S;  Service: Neurosurgery;  Laterality: Right;  right  . POLYPECTOMY    . 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 TEE WITHOUT CARDIOVERSION N/A 05/02/2019   Procedure: TRANSESOPHAGEAL ECHOCARDIOGRAM (TEE);  Surgeon: Pixie Casino, MD;  Location: [place];  Service: Cardiovascular;  Laterality: N/A;  . TOTAL ABDOMINAL HYSTERECTOMY  1978  . TOTAL HIP ARTHROPLASTY Left 04/2011  . TRANSTHORACIC ECHOCARDIOGRAM  12/2008   EF=>55%, borderline conc LVH; trace MR; mod TR; trace AV regurg     Medications Prior to Admission  Medication Sig Dispense Refill Last Dose  . AIMOVIG 140 MG/ML SOAJ Inject 140 mg into the skin every 30 (thirty) days.   04/29/2019 at unknown  . apixaban (ELIQUIS) 5 MG TABS tablet Take 1 tablet (5 mg total) by mouth 2 (two) times daily. 60 tablet 11 05/12/2019 at 0900  . aspirin-acetaminophen-caffeine (EXCEDRIN MIGRAINE) 250-250-65 MG tablet Take 1 tablet by mouth every 8 (eight) hours as needed for migraine.    Past Month at Unknown time  . b complex vitamins tablet Take 1 tablet by mouth daily. With vitamin c 500 mg   Past Week at Unknown  ELECTROPHYSIOLOGY CONSULT NOTE    Patient ID: MEDIA ADEL MRN: [id number], DOB/AGE: Sep 23, 1940 79 y.o.  Admit date: 05/12/2019 Date of Consult: 05/13/2019  Primary Physician: Ronita Hipps, MD Primary Cardiologist: Dr. Debara Pickett Electrophysiologist: New   Referring Provider: Dr. Debara Pickett  Patient Profile: Bridget [name] is a 79 y.o. 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 Debara Pickett.  HPI:  Bridget [name] is a 79 y.o. female with medical history as above. Her new AFL has been treated with coreg and eliquis for CHA2DS2VASC of at least 5. She underwent TEE/DCCV 05/02/19 which was successful, but did show new systolic HF with EF 99991111 range. Thought to be tachy-mediated.  She had recurrent AFL and underwent DCCV 2/19, within 24 hours she had reported back to the ED with recurrent DCCV 2/20, which was again only temporarily successful.   She presented to office 05/12/2019 with racing HR and SOB with profound fatigue. ECG showed ? AFL vs coarse AF with RVR at rate of 157 bpm (not available). Admitted for amiodarone load with consideration for recurrent DCCV vs ablation consideration. EP asked to see for further recommendations.   She denies chest pain, PND, orthopnea, nausea, vomiting, dizziness, syncope, edema, weight gain, or early satiety at this time.   Past Medical History:  Diagnosis Date  . Allergy   . Arthritis    hip OA  . Degenerative disc disease   . GERD (gastroesophageal reflux disease)   . Hyperlipidemia   . Hypertension   . Hypothyroidism   . Migraines   . Neuromuscular disorder ([place])   . Neuropathy   . Seasonal allergies   . 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 CARDIOVERSION N/A 05/02/2019   Procedure: CARDIOVERSION;  Surgeon: Pixie Casino, MD;  Location: [facility] ENDOSCOPY;  Service: Cardiovascular;  Laterality: N/A;  . CATARACT EXTRACTION, BILATERAL    . CERVICAL DISCECTOMY  2005   with fusion  . CHOLECYSTECTOMY  2006  . COLONOSCOPY    . LUMBAR LAMINECTOMY/DECOMPRESSION MICRODISCECTOMY Right 11/13/2014   Procedure: Right Lumbar four-five Microdiskectomy;  Surgeon: Leeroy Cha, MD;  Location: [place] [facility]S;  Service: Neurosurgery;  Laterality: Right;  right  . POLYPECTOMY    . 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 TEE WITHOUT CARDIOVERSION N/A 05/02/2019   Procedure: TRANSESOPHAGEAL ECHOCARDIOGRAM (TEE);  Surgeon: Pixie Casino, MD;  Location: [place];  Service: Cardiovascular;  Laterality: N/A;  . TOTAL ABDOMINAL HYSTERECTOMY  1978  . TOTAL HIP ARTHROPLASTY Left 04/2011  . TRANSTHORACIC ECHOCARDIOGRAM  12/2008   EF=>55%, borderline conc LVH; trace MR; mod TR; trace AV regurg     Medications Prior to Admission  Medication Sig Dispense Refill Last Dose  . AIMOVIG 140 MG/ML SOAJ Inject 140 mg into the skin every 30 (thirty) days.   04/29/2019 at unknown  . apixaban (ELIQUIS) 5 MG TABS tablet Take 1 tablet (5 mg total) by mouth 2 (two) times daily. 60 tablet 11 05/12/2019 at 0900  . aspirin-acetaminophen-caffeine (EXCEDRIN MIGRAINE) 250-250-65 MG tablet Take 1 tablet by mouth every 8 (eight) hours as needed for migraine.    Past Month at Unknown time  . b complex vitamins tablet Take 1 tablet by mouth daily. With vitamin c 500 mg   Past Week at Unknown  By: Randa Ngo M.D.   On: 05/12/2019 19:58   DG Chest Port 1 View  Result Date: 05/10/2019 CLINICAL DATA:  Dyspnea. EXAM: PORTABLE CHEST 1 VIEW COMPARISON:  06/22/2003 FINDINGS: Lungs are adequately inflated without focal airspace consolidation or effusion. Borderline cardiomegaly. Fusion hardware intact over the cervical spine. Mild degenerative change of the  thoracic spine. IMPRESSION: No acute cardiopulmonary disease. Borderline cardiomegaly. Electronically Signed   By: Marin Olp M.D.   On: 05/10/2019 10:17   ECHO TEE  Result Date: 05/02/2019    TRANSESOPHOGEAL ECHO REPORT   Patient Name:   Bridget [name] Date of Exam: 05/02/2019 Medical Rec #:  [id number]       Height:       66.0 in Accession #:    [id number]      Weight:       165.8 lb Date of Birth:  23-Oct-1940        BSA:          1.85 m Patient Age:    47 years        BP:           105/75 mmHg Patient Gender: F               HR:           137 bpm. Exam Location:  Inpatient Procedure: Transesophageal Echo, Color Doppler and Cardiac Doppler Indications:     A-flutter with RVR i48.92  History:         Patient has prior history of Echocardiogram examinations, most                  recent 12/19/2010. Arrythmias:Atrial Flutter; Risk                  Factors:Hypertension and Dyslipidemia.  Sonographer:     Raquel Sarna Senior RDCS Referring Phys:  [id number] Nadean Corwin HILTY Diagnosing Phys: Lyman Bishop MD PROCEDURE: After discussion of the risks and benefits of a TEE, an informed consent was obtained from the patient. The transesophogeal probe was passed without difficulty through the esophogus of the patient. Sedation performed by different physician. The patient was monitored while under deep sedation. Anesthestetic sedation was provided intravenously by Anesthesiology: 163mg  of Propofol, 100mg  of Lidocaine. The patient developed no complications during the procedure. A successful direct current cardioversion was performed. IMPRESSIONS  1. Left ventricular ejection fraction, by estimation, is 30 to 35%. The left ventricle has moderately decreased function. The left ventricle demonstrates global hypokinesis. The left ventricular internal cavity size was mildly dilated. Left ventricular diastolic function could not be evaluated.  2. Right ventricular systolic function is normal. The right ventricular size is normal.  3. Left  atrial size was mildly dilated. No left atrial/left atrial appendage thrombus was detected.  4. The mitral valve is grossly normal. Mild mitral valve regurgitation.  5. The aortic valve is tricuspid. Aortic valve regurgitation is mild.  6. Cannot exclude small PFO. Conclusion(s)/Recomendation(s): No LA/LAA thrombus identified. Successful cardioversion performed with restoration of normal sinus rhythm. FINDINGS  Left Ventricle: Left ventricular ejection fraction, by estimation, is 30 to 35%. The left ventricle has moderately decreased function. The left ventricle demonstrates global hypokinesis. The left ventricular internal cavity size was mildly dilated. There is no left ventricular hypertrophy. Left ventricular diastolic function could not be evaluated. Right Ventricle: The right ventricular size is normal. No increase in right ventricular wall thickness. Right ventricular systolic function is normal. Left Atrium: Left atrial size was  By: Randa Ngo M.D.   On: 05/12/2019 19:58   DG Chest Port 1 View  Result Date: 05/10/2019 CLINICAL DATA:  Dyspnea. EXAM: PORTABLE CHEST 1 VIEW COMPARISON:  06/22/2003 FINDINGS: Lungs are adequately inflated without focal airspace consolidation or effusion. Borderline cardiomegaly. Fusion hardware intact over the cervical spine. Mild degenerative change of the  thoracic spine. IMPRESSION: No acute cardiopulmonary disease. Borderline cardiomegaly. Electronically Signed   By: Marin Olp M.D.   On: 05/10/2019 10:17   ECHO TEE  Result Date: 05/02/2019    TRANSESOPHOGEAL ECHO REPORT   Patient Name:   Bridget [name] Date of Exam: 05/02/2019 Medical Rec #:  [id number]       Height:       66.0 in Accession #:    [id number]      Weight:       165.8 lb Date of Birth:  23-Oct-1940        BSA:          1.85 m Patient Age:    47 years        BP:           105/75 mmHg Patient Gender: F               HR:           137 bpm. Exam Location:  Inpatient Procedure: Transesophageal Echo, Color Doppler and Cardiac Doppler Indications:     A-flutter with RVR i48.92  History:         Patient has prior history of Echocardiogram examinations, most                  recent 12/19/2010. Arrythmias:Atrial Flutter; Risk                  Factors:Hypertension and Dyslipidemia.  Sonographer:     Raquel Sarna Senior RDCS Referring Phys:  [id number] Nadean Corwin HILTY Diagnosing Phys: Lyman Bishop MD PROCEDURE: After discussion of the risks and benefits of a TEE, an informed consent was obtained from the patient. The transesophogeal probe was passed without difficulty through the esophogus of the patient. Sedation performed by different physician. The patient was monitored while under deep sedation. Anesthestetic sedation was provided intravenously by Anesthesiology: 163mg  of Propofol, 100mg  of Lidocaine. The patient developed no complications during the procedure. A successful direct current cardioversion was performed. IMPRESSIONS  1. Left ventricular ejection fraction, by estimation, is 30 to 35%. The left ventricle has moderately decreased function. The left ventricle demonstrates global hypokinesis. The left ventricular internal cavity size was mildly dilated. Left ventricular diastolic function could not be evaluated.  2. Right ventricular systolic function is normal. The right ventricular size is normal.  3. Left  atrial size was mildly dilated. No left atrial/left atrial appendage thrombus was detected.  4. The mitral valve is grossly normal. Mild mitral valve regurgitation.  5. The aortic valve is tricuspid. Aortic valve regurgitation is mild.  6. Cannot exclude small PFO. Conclusion(s)/Recomendation(s): No LA/LAA thrombus identified. Successful cardioversion performed with restoration of normal sinus rhythm. FINDINGS  Left Ventricle: Left ventricular ejection fraction, by estimation, is 30 to 35%. The left ventricle has moderately decreased function. The left ventricle demonstrates global hypokinesis. The left ventricular internal cavity size was mildly dilated. There is no left ventricular hypertrophy. Left ventricular diastolic function could not be evaluated. Right Ventricle: The right ventricular size is normal. No increase in right ventricular wall thickness. Right ventricular systolic function is normal. Left Atrium: Left atrial size was  By: Randa Ngo M.D.   On: 05/12/2019 19:58   DG Chest Port 1 View  Result Date: 05/10/2019 CLINICAL DATA:  Dyspnea. EXAM: PORTABLE CHEST 1 VIEW COMPARISON:  06/22/2003 FINDINGS: Lungs are adequately inflated without focal airspace consolidation or effusion. Borderline cardiomegaly. Fusion hardware intact over the cervical spine. Mild degenerative change of the  thoracic spine. IMPRESSION: No acute cardiopulmonary disease. Borderline cardiomegaly. Electronically Signed   By: Marin Olp M.D.   On: 05/10/2019 10:17   ECHO TEE  Result Date: 05/02/2019    TRANSESOPHOGEAL ECHO REPORT   Patient Name:   Bridget [name] Date of Exam: 05/02/2019 Medical Rec #:  [id number]       Height:       66.0 in Accession #:    [id number]      Weight:       165.8 lb Date of Birth:  23-Oct-1940        BSA:          1.85 m Patient Age:    47 years        BP:           105/75 mmHg Patient Gender: F               HR:           137 bpm. Exam Location:  Inpatient Procedure: Transesophageal Echo, Color Doppler and Cardiac Doppler Indications:     A-flutter with RVR i48.92  History:         Patient has prior history of Echocardiogram examinations, most                  recent 12/19/2010. Arrythmias:Atrial Flutter; Risk                  Factors:Hypertension and Dyslipidemia.  Sonographer:     Raquel Sarna Senior RDCS Referring Phys:  [id number] Nadean Corwin HILTY Diagnosing Phys: Lyman Bishop MD PROCEDURE: After discussion of the risks and benefits of a TEE, an informed consent was obtained from the patient. The transesophogeal probe was passed without difficulty through the esophogus of the patient. Sedation performed by different physician. The patient was monitored while under deep sedation. Anesthestetic sedation was provided intravenously by Anesthesiology: 163mg  of Propofol, 100mg  of Lidocaine. The patient developed no complications during the procedure. A successful direct current cardioversion was performed. IMPRESSIONS  1. Left ventricular ejection fraction, by estimation, is 30 to 35%. The left ventricle has moderately decreased function. The left ventricle demonstrates global hypokinesis. The left ventricular internal cavity size was mildly dilated. Left ventricular diastolic function could not be evaluated.  2. Right ventricular systolic function is normal. The right ventricular size is normal.  3. Left  atrial size was mildly dilated. No left atrial/left atrial appendage thrombus was detected.  4. The mitral valve is grossly normal. Mild mitral valve regurgitation.  5. The aortic valve is tricuspid. Aortic valve regurgitation is mild.  6. Cannot exclude small PFO. Conclusion(s)/Recomendation(s): No LA/LAA thrombus identified. Successful cardioversion performed with restoration of normal sinus rhythm. FINDINGS  Left Ventricle: Left ventricular ejection fraction, by estimation, is 30 to 35%. The left ventricle has moderately decreased function. The left ventricle demonstrates global hypokinesis. The left ventricular internal cavity size was mildly dilated. There is no left ventricular hypertrophy. Left ventricular diastolic function could not be evaluated. Right Ventricle: The right ventricular size is normal. No increase in right ventricular wall thickness. Right ventricular systolic function is normal. Left Atrium: Left atrial size was

## 2019-05-13 NOTE — Progress Notes (Addendum)
DAILY PROGRESS NOTE   Patient Name: Bridget [name] Date of Encounter: 05/13/2019 Cardiologist: No primary care provider on file.  Chief Complaint   Feels better today  Patient Profile   79 yo female with recurrent afib/flutter, acute systolic heart failure, admitted with recurrent afib with RVR and fatigue/shortness of breath.  Subjective   Bridget [name] did not sleep well overnight, but is breathing better today. BNP elevated, so suspect she had some central congestive heart failure- CXR clear. Diuresis does not show net negative - weight 74.2 kg. Appears to be in atrial flutter this am - rate slower.  Objective   Vitals:   05/13/19 0200 05/13/19 0315 05/13/19 0417 05/13/19 0737  BP: (!) 122/100  124/86 (!) 144/100  Pulse:   95   Resp: 14  16 17   Temp:   (!) 97.5 F (36.4 C) 98.2 F (36.8 C)  TempSrc:   Oral Oral  SpO2:   100% 98%  Weight:  74.2 kg      Intake/Output Summary (Last 24 hours) at 05/13/2019 [id number] Last data filed at 05/13/2019 [id number] Gross per 24 hour  Intake 833.15 ml  Output 600 ml  Net 233.15 ml   Filed Weights   05/13/19 0315  Weight: 74.2 kg    Physical Exam   General appearance: alert, no distress and pale Neck: no carotid bruit, no JVD and thyroid not enlarged, symmetric, no tenderness/mass/nodules Lungs: clear to auscultation bilaterally Heart: regular rate and rhythm Abdomen: soft, non-tender; bowel sounds normal; no masses,  no organomegaly Extremities: extremities normal, atraumatic, no cyanosis or edema Pulses: 2+ and symmetric Skin: Skin color, texture, turgor normal. No rashes or lesions Neurologic: Grossly normal Psych: Pleasant  Inpatient Medications    Scheduled Meds: . apixaban  5 mg Oral BID  . carvedilol  6.25 mg Oral BID WC  . furosemide  20 mg Oral Daily  . levothyroxine  75 mcg Oral Q0600  . rosuvastatin  10 mg Oral q1800    Continuous Infusions: . amiodarone 30 mg/hr (05/13/19 0808)    PRN Meds: acetaminophen,  nitroGLYCERIN, ondansetron (ZOFRAN) IV, traMADol, zolpidem   Labs   Results for orders placed or performed during the hospital encounter of 05/12/19 (from the past 48 hour(s))  Comprehensive metabolic panel     Status: Abnormal   Collection Time: 05/12/19  6:16 PM  Result Value Ref Range   Sodium 144 135 - 145 mmol/L   Potassium 4.7 3.5 - 5.1 mmol/L   Chloride 108 98 - 111 mmol/L   CO2 27 22 - 32 mmol/L   Glucose, Bld 112 (H) 70 - 99 mg/dL   BUN 13 8 - 23 mg/dL   Creatinine, Ser 0.79 0.44 - 1.00 mg/dL   Calcium 9.6 8.9 - 10.3 mg/dL   Total Protein 6.3 (L) 6.5 - 8.1 g/dL   Albumin 3.6 3.5 - 5.0 g/dL   AST 19 15 - 41 U/L   ALT 18 0 - 44 U/L   Alkaline Phosphatase 63 38 - 126 U/L   Total Bilirubin 0.7 0.3 - 1.2 mg/dL   GFR calc non Af Amer >60 >60 mL/min   GFR calc Af Amer >60 >60 mL/min   Anion gap 9 5 - 15    Comment: Performed at [facility], [address], [place], [place] [address]  TSH     Status: Abnormal   Collection Time: 05/12/19  6:16 PM  Result Value Ref Range   TSH 4.703 (H) 0.350 -  4.500 uIU/mL    Comment: Performed by a 3rd Generation assay with a functional sensitivity of <=0.01 uIU/mL. Performed at [facility], [place] [address], [place], [place] [address]   Brain natriuretic peptide     Status: Abnormal   Collection Time: 05/12/19  6:16 PM  Result Value Ref Range   B Natriuretic Peptide 228.8 (H) 0.0 - 100.0 pg/mL    Comment: Performed at [facility] [address], [place], [place] [address]  CBC WITH DIFFERENTIAL     Status: None   Collection Time: 05/12/19  6:16 PM  Result Value Ref Range   WBC 6.7 4.0 - 10.5 K/uL   RBC 4.70 3.87 - 5.11 MIL/uL   Hemoglobin 13.6 12.0 - 15.0 g/dL   HCT 43.6 36.0 - 46.0 %   MCV 92.8 80.0 - 100.0 fL   MCH 28.9 26.0 - 34.0 pg   MCHC 31.2 30.0 - 36.0 g/dL   RDW 14.3 11.5 - 15.5 %   Platelets 197 150 - 400 K/uL   nRBC 0.0 0.0 - 0.2 %   Neutrophils Relative % 58 %   Neutro Abs 4.0 1.7 - 7.7 K/uL    Lymphocytes Relative 29 %   Lymphs Abs 1.9 0.7 - 4.0 K/uL   Monocytes Relative 10 %   Monocytes Absolute 0.7 0.1 - 1.0 K/uL   Eosinophils Relative 2 %   Eosinophils Absolute 0.1 0.0 - 0.5 K/uL   Basophils Relative 1 %   Basophils Absolute 0.0 0.0 - 0.1 K/uL   Immature Granulocytes 0 %   Abs Immature Granulocytes 0.02 0.00 - 0.07 K/uL    Comment: Performed at [facility], [address], [place], [place] [address]  Magnesium     Status: None   Collection Time: 05/12/19  6:16 PM  Result Value Ref Range   Magnesium 2.1 1.7 - 2.4 mg/dL    Comment: Performed at [facility] [address] [place], [place] [address]  SARS CORONAVIRUS 2 (TAT 6-24 HRS) Nasopharyngeal Nasopharyngeal Swab     Status: None   Collection Time: 05/12/19  7:16 PM   Specimen: Nasopharyngeal Swab  Result Value Ref Range   SARS Coronavirus 2 NEGATIVE NEGATIVE    Comment: (NOTE) SARS-CoV-2 target nucleic acids are NOT DETECTED. The SARS-CoV-2 RNA is generally detectable in upper and lower respiratory specimens during the acute phase of infection. Negative results do not preclude SARS-CoV-2 infection, do not rule out co-infections with other pathogens, and should not be used as the sole basis for treatment or other patient management decisions. Negative results must be combined with clinical observations, patient history, and epidemiological information. The expected result is Negative. Fact Sheet for Patients: SugarRoll.be Fact Sheet for Healthcare Providers: https://www.woods-mathews.com/ This test is not yet approved or cleared by the [place] FDA and  has been authorized for detection and/or diagnosis of SARS-CoV-2 by FDA under an Emergency Use Authorization (EUA). This EUA will remain  in effect (meaning this test can be used) for the duration of the COVID-19 declaration under Section 56 4(b)(1) of the Act, 21 U.S.C. section 360bbb-3(b)(1), unless  the authorization is terminated or revoked sooner. Performed at [facility], [place] [address], [place], [place] [address]   Lipid panel     Status: Abnormal   Collection Time: 05/13/19  6:00 AM  Result Value Ref Range   Cholesterol 221 (H) 0 - 200 mg/dL   Triglycerides 124 <150 mg/dL   HDL  48 >40 mg/dL   Total CHOL/HDL Ratio 4.6 RATIO   VLDL 25 0 - 40 mg/dL   LDL Cholesterol 148 (H) 0 - 99 mg/dL    Comment:        Total Cholesterol/HDL:CHD Risk Coronary Heart Disease Risk Table                     Men   Women  1/2 Average Risk   3.4   3.3  Average Risk       5.0   4.4  2 X Average Risk   9.6   7.1  3 X Average Risk  23.4   11.0        Use the calculated Patient Ratio above and the CHD Risk Table to determine the patient's CHD Risk.        ATP III CLASSIFICATION (LDL):  <100     mg/dL   Optimal  100-129  mg/dL   Near or Above                    Optimal  130-159  mg/dL   Borderline  160-189  mg/dL   High  >190     mg/dL   Very High Performed at [facility] [address], [place], [place] [id number]   Basic metabolic panel     Status: Abnormal   Collection Time: 05/13/19  6:00 AM  Result Value Ref Range   Sodium 141 135 - 145 mmol/L   Potassium 3.8 3.5 - 5.1 mmol/L   Chloride 107 98 - 111 mmol/L   CO2 24 22 - 32 mmol/L   Glucose, Bld 124 (H) 70 - 99 mg/dL   BUN 13 8 - 23 mg/dL   Creatinine, Ser 0.78 0.44 - 1.00 mg/dL   Calcium 9.2 8.9 - 10.3 mg/dL   GFR calc non Af Amer >60 >60 mL/min   GFR calc Af Amer >60 >60 mL/min   Anion gap 10 5 - 15    Comment: Performed at [facility], [place] [address] [place], [place] [address]    ECG   N/A  Telemetry   Atrial flutter in 120's - Personally Reviewed  Radiology    DG CHEST PORT 1 VIEW  Result Date: 05/12/2019 CLINICAL DATA:  Congestive heart failure EXAM: PORTABLE CHEST 1 VIEW COMPARISON:  05/10/2019 FINDINGS: Single frontal view of the chest demonstrates an enlarged cardiac silhouette, stable.  No airspace disease, effusion, or pneumothorax. No acute bony abnormalities. IMPRESSION: 1. Stable exam, no acute process. Electronically Signed   By: Randa Ngo M.D.   On: 05/12/2019 19:58    Cardiac Studies   N/A  Assessment   Principal Problem:   Atrial fibrillation with RVR (HCC) Active Problems:   Atrial flutter (HCC)   Acute systolic (congestive) heart failure ([place])   Plan   1. Ms. Simms is feeling better today - some diuresis yesterday, but not net negative - additional lasix today. Still remains in RVR, acute systolic congestive heart failure. HR better controlled on amiodarone - appears to be in flutter in the 120's today. Will try to arrange for DCCV tomorrow or Thursday. D/w cardiac EP - asked them to eval for possible ablation, given her cardiomyopathy and recurrent symptomatic afib/flutter with fast rates - she does not desire to be on long-term amiodarone. Noted TSH mildly elevated, add free T4, T3. LDL above target - increase crestor to 20 mg daily.  Time Spent Directly with  Patient:  I have spent a total of 35 minutes with the patient reviewing hospital notes, telemetry, EKGs, labs and examining the patient as well as establishing an assessment and plan that was discussed personally with the patient.  > 50% of time was spent in direct patient care.  Length of Stay:  LOS: 1 day   Pixie Casino, MD, [facility], [place] Director of the Advanced Lipid Disorders &  Cardiovascular Risk Reduction Clinic Diplomate of the American Board of Clinical Lipidology Attending Cardiologist  Direct Dial: [phone]  Fax: [phone]  Website:  [email or web address] Jaiden Dinkins 05/13/2019, 9:10 AM

## 2019-05-13 NOTE — Consult Note (Addendum)
ELECTROPHYSIOLOGY CONSULT NOTE    Patient ID: Bridget [name] MRN: [id number], DOB/AGE: 05/29/1940 79 y.o.  Admit date: 05/12/2019 Date of Consult: 05/13/2019  Primary Physician: Ronita Hipps, MD Primary Cardiologist: Dr. Debara Pickett Electrophysiologist: New   Referring Provider: Dr. Debara Pickett  Patient Profile: Bridget [name] is a 79 y.o. 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 Debara Pickett.  HPI:  Bridget [name] is a 79 y.o. female with medical history as above. Her new AFL has been treated with coreg and eliquis for CHA2DS2VASC of at least 5. She underwent TEE/DCCV 05/02/19 which was successful, but did show new systolic HF with EF 99991111 range. Thought to be tachy-mediated.  She had recurrent AFL and underwent DCCV 2/19, within 24 hours she had reported back to the ED with recurrent DCCV 2/20, which was again only temporarily successful.   She presented to office 05/12/2019 with racing HR and SOB with profound fatigue. ECG showed ? AFL vs coarse AF with RVR at rate of 157 bpm (not available). Admitted for amiodarone load with consideration for recurrent DCCV vs ablation consideration. EP asked to see for further recommendations.   She denies chest pain, PND, orthopnea, nausea, vomiting, dizziness, syncope, edema, weight gain, or early satiety at this time.   Past Medical History:  Diagnosis Date  . Allergy   . Arthritis    hip OA  . Degenerative disc disease   . GERD (gastroesophageal reflux disease)   . Hyperlipidemia   . Hypertension   . Hypothyroidism   . Migraines   . Neuromuscular disorder ([place])   . Neuropathy   . Seasonal allergies   . 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 CARDIOVERSION N/A 05/02/2019   Procedure: CARDIOVERSION;  Surgeon: Pixie Casino, MD;  Location: [facility];  Service: Cardiovascular;  Laterality: N/A;  . CATARACT EXTRACTION, BILATERAL    . CERVICAL DISCECTOMY  2005   with fusion  . CHOLECYSTECTOMY  2006  . COLONOSCOPY    . LUMBAR LAMINECTOMY/DECOMPRESSION MICRODISCECTOMY Right 11/13/2014   Procedure: Right Lumbar four-five Microdiskectomy;  Surgeon: Leeroy Cha, MD;  Location: [facility];  Service: Neurosurgery;  Laterality: Right;  right  . POLYPECTOMY    . 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 TEE WITHOUT CARDIOVERSION N/A 05/02/2019   Procedure: TRANSESOPHAGEAL ECHOCARDIOGRAM (TEE);  Surgeon: Pixie Casino, MD;  Location: [facility];  Service: Cardiovascular;  Laterality: N/A;  . TOTAL ABDOMINAL HYSTERECTOMY  1978  . TOTAL HIP ARTHROPLASTY Left 04/2011  . TRANSTHORACIC ECHOCARDIOGRAM  12/2008   EF=>55%, borderline conc LVH; trace MR; mod TR; trace AV regurg     Medications Prior to Admission  Medication Sig Dispense Refill Last Dose  . AIMOVIG 140 MG/ML SOAJ Inject 140 mg into the skin every 30 (thirty) days.   04/29/2019 at unknown  . apixaban (ELIQUIS) 5 MG TABS tablet Take 1 tablet (5 mg total) by mouth 2 (two) times daily. 60 tablet 11 05/12/2019 at 0900  . aspirin-acetaminophen-caffeine (EXCEDRIN MIGRAINE) 250-250-65 MG tablet Take 1 tablet by mouth every 8 (eight) hours as needed for migraine.    Past Month at Unknown time  . b complex vitamins tablet Take 1 tablet by mouth daily. With vitamin c 500 mg   Past Week at Unknown  ELECTROPHYSIOLOGY CONSULT NOTE    Patient ID: Bridget [name] MRN: [id number], DOB/AGE: 05/29/1940 79 y.o.  Admit date: 05/12/2019 Date of Consult: 05/13/2019  Primary Physician: Ronita Hipps, MD Primary Cardiologist: Dr. Debara Pickett Electrophysiologist: New   Referring Provider: Dr. Debara Pickett  Patient Profile: Bridget [name] is a 79 y.o. 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 Debara Pickett.  HPI:  Bridget [name] is a 79 y.o. female with medical history as above. Her new AFL has been treated with coreg and eliquis for CHA2DS2VASC of at least 5. She underwent TEE/DCCV 05/02/19 which was successful, but did show new systolic HF with EF 99991111 range. Thought to be tachy-mediated.  She had recurrent AFL and underwent DCCV 2/19, within 24 hours she had reported back to the ED with recurrent DCCV 2/20, which was again only temporarily successful.   She presented to office 05/12/2019 with racing HR and SOB with profound fatigue. ECG showed ? AFL vs coarse AF with RVR at rate of 157 bpm (not available). Admitted for amiodarone load with consideration for recurrent DCCV vs ablation consideration. EP asked to see for further recommendations.   She denies chest pain, PND, orthopnea, nausea, vomiting, dizziness, syncope, edema, weight gain, or early satiety at this time.   Past Medical History:  Diagnosis Date  . Allergy   . Arthritis    hip OA  . Degenerative disc disease   . GERD (gastroesophageal reflux disease)   . Hyperlipidemia   . Hypertension   . Hypothyroidism   . Migraines   . Neuromuscular disorder ([place])   . Neuropathy   . Seasonal allergies   . 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 CARDIOVERSION N/A 05/02/2019   Procedure: CARDIOVERSION;  Surgeon: Pixie Casino, MD;  Location: [facility];  Service: Cardiovascular;  Laterality: N/A;  . CATARACT EXTRACTION, BILATERAL    . CERVICAL DISCECTOMY  2005   with fusion  . CHOLECYSTECTOMY  2006  . COLONOSCOPY    . LUMBAR LAMINECTOMY/DECOMPRESSION MICRODISCECTOMY Right 11/13/2014   Procedure: Right Lumbar four-five Microdiskectomy;  Surgeon: Leeroy Cha, MD;  Location: [facility];  Service: Neurosurgery;  Laterality: Right;  right  . POLYPECTOMY    . 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 TEE WITHOUT CARDIOVERSION N/A 05/02/2019   Procedure: TRANSESOPHAGEAL ECHOCARDIOGRAM (TEE);  Surgeon: Pixie Casino, MD;  Location: [facility];  Service: Cardiovascular;  Laterality: N/A;  . TOTAL ABDOMINAL HYSTERECTOMY  1978  . TOTAL HIP ARTHROPLASTY Left 04/2011  . TRANSTHORACIC ECHOCARDIOGRAM  12/2008   EF=>55%, borderline conc LVH; trace MR; mod TR; trace AV regurg     Medications Prior to Admission  Medication Sig Dispense Refill Last Dose  . AIMOVIG 140 MG/ML SOAJ Inject 140 mg into the skin every 30 (thirty) days.   04/29/2019 at unknown  . apixaban (ELIQUIS) 5 MG TABS tablet Take 1 tablet (5 mg total) by mouth 2 (two) times daily. 60 tablet 11 05/12/2019 at 0900  . aspirin-acetaminophen-caffeine (EXCEDRIN MIGRAINE) 250-250-65 MG tablet Take 1 tablet by mouth every 8 (eight) hours as needed for migraine.    Past Month at Unknown time  . b complex vitamins tablet Take 1 tablet by mouth daily. With vitamin c 500 mg   Past Week at Unknown  By: Randa Ngo M.D.   On: 05/12/2019 19:58   DG Chest Port 1 View  Result Date: 05/10/2019 CLINICAL DATA:  Dyspnea. EXAM: PORTABLE CHEST 1 VIEW COMPARISON:  06/22/2003 FINDINGS: Lungs are adequately inflated without focal airspace consolidation or effusion. Borderline cardiomegaly. Fusion hardware intact over the cervical spine. Mild degenerative change of the  thoracic spine. IMPRESSION: No acute cardiopulmonary disease. Borderline cardiomegaly. Electronically Signed   By: Marin Olp M.D.   On: 05/10/2019 10:17   ECHO TEE  Result Date: 05/02/2019    TRANSESOPHOGEAL ECHO REPORT   Patient Name:   TAMEESHA ZASADA Date of Exam: 05/02/2019 Medical Rec #:  [id number]       Height:       66.0 in Accession #:    [id number]      Weight:       165.8 lb Date of Birth:  01/08/1941        BSA:          1.85 m Patient Age:    89 years        BP:           105/75 mmHg Patient Gender: F               HR:           137 bpm. Exam Location:  Inpatient Procedure: Transesophageal Echo, Color Doppler and Cardiac Doppler Indications:     A-flutter with RVR i48.92  History:         Patient has prior history of Echocardiogram examinations, most                  recent 12/19/2010. Arrythmias:Atrial Flutter; Risk                  Factors:Hypertension and Dyslipidemia.  Sonographer:     Raquel Sarna Senior RDCS Referring Phys:  [id number] Nadean Corwin HILTY Diagnosing Phys: Lyman Bishop MD PROCEDURE: After discussion of the risks and benefits of a TEE, an informed consent was obtained from the patient. The transesophogeal probe was passed without difficulty through the esophogus of the patient. Sedation performed by different physician. The patient was monitored while under deep sedation. Anesthestetic sedation was provided intravenously by Anesthesiology: 163mg  of Propofol, 100mg  of Lidocaine. The patient developed no complications during the procedure. A successful direct current cardioversion was performed. IMPRESSIONS  1. Left ventricular ejection fraction, by estimation, is 30 to 35%. The left ventricle has moderately decreased function. The left ventricle demonstrates global hypokinesis. The left ventricular internal cavity size was mildly dilated. Left ventricular diastolic function could not be evaluated.  2. Right ventricular systolic function is normal. The right ventricular size is normal.  3. Left  atrial size was mildly dilated. No left atrial/left atrial appendage thrombus was detected.  4. The mitral valve is grossly normal. Mild mitral valve regurgitation.  5. The aortic valve is tricuspid. Aortic valve regurgitation is mild.  6. Cannot exclude small PFO. Conclusion(s)/Recomendation(s): No LA/LAA thrombus identified. Successful cardioversion performed with restoration of normal sinus rhythm. FINDINGS  Left Ventricle: Left ventricular ejection fraction, by estimation, is 30 to 35%. The left ventricle has moderately decreased function. The left ventricle demonstrates global hypokinesis. The left ventricular internal cavity size was mildly dilated. There is no left ventricular hypertrophy. Left ventricular diastolic function could not be evaluated. Right Ventricle: The right ventricular size is normal. No increase in right ventricular wall thickness. Right ventricular systolic function is normal. Left Atrium: Left atrial size was  By: Randa Ngo M.D.   On: 05/12/2019 19:58   DG Chest Port 1 View  Result Date: 05/10/2019 CLINICAL DATA:  Dyspnea. EXAM: PORTABLE CHEST 1 VIEW COMPARISON:  06/22/2003 FINDINGS: Lungs are adequately inflated without focal airspace consolidation or effusion. Borderline cardiomegaly. Fusion hardware intact over the cervical spine. Mild degenerative change of the  thoracic spine. IMPRESSION: No acute cardiopulmonary disease. Borderline cardiomegaly. Electronically Signed   By: Marin Olp M.D.   On: 05/10/2019 10:17   ECHO TEE  Result Date: 05/02/2019    TRANSESOPHOGEAL ECHO REPORT   Patient Name:   TAMEESHA ZASADA Date of Exam: 05/02/2019 Medical Rec #:  [id number]       Height:       66.0 in Accession #:    [id number]      Weight:       165.8 lb Date of Birth:  01/08/1941        BSA:          1.85 m Patient Age:    89 years        BP:           105/75 mmHg Patient Gender: F               HR:           137 bpm. Exam Location:  Inpatient Procedure: Transesophageal Echo, Color Doppler and Cardiac Doppler Indications:     A-flutter with RVR i48.92  History:         Patient has prior history of Echocardiogram examinations, most                  recent 12/19/2010. Arrythmias:Atrial Flutter; Risk                  Factors:Hypertension and Dyslipidemia.  Sonographer:     Raquel Sarna Senior RDCS Referring Phys:  [id number] Nadean Corwin HILTY Diagnosing Phys: Lyman Bishop MD PROCEDURE: After discussion of the risks and benefits of a TEE, an informed consent was obtained from the patient. The transesophogeal probe was passed without difficulty through the esophogus of the patient. Sedation performed by different physician. The patient was monitored while under deep sedation. Anesthestetic sedation was provided intravenously by Anesthesiology: 163mg  of Propofol, 100mg  of Lidocaine. The patient developed no complications during the procedure. A successful direct current cardioversion was performed. IMPRESSIONS  1. Left ventricular ejection fraction, by estimation, is 30 to 35%. The left ventricle has moderately decreased function. The left ventricle demonstrates global hypokinesis. The left ventricular internal cavity size was mildly dilated. Left ventricular diastolic function could not be evaluated.  2. Right ventricular systolic function is normal. The right ventricular size is normal.  3. Left  atrial size was mildly dilated. No left atrial/left atrial appendage thrombus was detected.  4. The mitral valve is grossly normal. Mild mitral valve regurgitation.  5. The aortic valve is tricuspid. Aortic valve regurgitation is mild.  6. Cannot exclude small PFO. Conclusion(s)/Recomendation(s): No LA/LAA thrombus identified. Successful cardioversion performed with restoration of normal sinus rhythm. FINDINGS  Left Ventricle: Left ventricular ejection fraction, by estimation, is 30 to 35%. The left ventricle has moderately decreased function. The left ventricle demonstrates global hypokinesis. The left ventricular internal cavity size was mildly dilated. There is no left ventricular hypertrophy. Left ventricular diastolic function could not be evaluated. Right Ventricle: The right ventricular size is normal. No increase in right ventricular wall thickness. Right ventricular systolic function is normal. Left Atrium: Left atrial size was  ELECTROPHYSIOLOGY CONSULT NOTE    Patient ID: Bridget [name] MRN: [id number], DOB/AGE: 05/29/1940 79 y.o.  Admit date: 05/12/2019 Date of Consult: 05/13/2019  Primary Physician: Ronita Hipps, MD Primary Cardiologist: Dr. Debara Pickett Electrophysiologist: New   Referring Provider: Dr. Debara Pickett  Patient Profile: Bridget [name] is a 79 y.o. 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 Debara Pickett.  HPI:  Bridget [name] is a 79 y.o. female with medical history as above. Her new AFL has been treated with coreg and eliquis for CHA2DS2VASC of at least 5. She underwent TEE/DCCV 05/02/19 which was successful, but did show new systolic HF with EF 99991111 range. Thought to be tachy-mediated.  She had recurrent AFL and underwent DCCV 2/19, within 24 hours she had reported back to the ED with recurrent DCCV 2/20, which was again only temporarily successful.   She presented to office 05/12/2019 with racing HR and SOB with profound fatigue. ECG showed ? AFL vs coarse AF with RVR at rate of 157 bpm (not available). Admitted for amiodarone load with consideration for recurrent DCCV vs ablation consideration. EP asked to see for further recommendations.   She denies chest pain, PND, orthopnea, nausea, vomiting, dizziness, syncope, edema, weight gain, or early satiety at this time.   Past Medical History:  Diagnosis Date  . Allergy   . Arthritis    hip OA  . Degenerative disc disease   . GERD (gastroesophageal reflux disease)   . Hyperlipidemia   . Hypertension   . Hypothyroidism   . Migraines   . Neuromuscular disorder ([place])   . Neuropathy   . Seasonal allergies   . 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 CARDIOVERSION N/A 05/02/2019   Procedure: CARDIOVERSION;  Surgeon: Pixie Casino, MD;  Location: [facility] END[facility]COPY;  Service: Cardiovascular;  Laterality: N/A;  . CATARACT EXTRACTION, BILATERAL    . CERVICAL DISCECTOMY  2005   with fusion  . CHOLECYSTECTOMY  2006  . COLONOSCOPY    . LUMBAR LAMINECTOMY/DECOMPRESSION MICRODISCECTOMY Right 11/13/2014   Procedure: Right Lumbar four-five Microdiskectomy;  Surgeon: Leeroy Cha, MD;  Location: [facility];  Service: Neurosurgery;  Laterality: Right;  right  . POLYPECTOMY    . 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 TEE WITHOUT CARDIOVERSION N/A 05/02/2019   Procedure: TRANSESOPHAGEAL ECHOCARDIOGRAM (TEE);  Surgeon: Pixie Casino, MD;  Location: [facility];  Service: Cardiovascular;  Laterality: N/A;  . TOTAL ABDOMINAL HYSTERECTOMY  1978  . TOTAL HIP ARTHROPLASTY Left 04/2011  . TRANSTHORACIC ECHOCARDIOGRAM  12/2008   EF=>55%, borderline conc LVH; trace MR; mod TR; trace AV regurg     Medications Prior to Admission  Medication Sig Dispense Refill Last Dose  . AIMOVIG 140 MG/ML SOAJ Inject 140 mg into the skin every 30 (thirty) days.   04/29/2019 at unknown  . apixaban (ELIQUIS) 5 MG TABS tablet Take 1 tablet (5 mg total) by mouth 2 (two) times daily. 60 tablet 11 05/12/2019 at 0900  . aspirin-acetaminophen-caffeine (EXCEDRIN MIGRAINE) 250-250-65 MG tablet Take 1 tablet by mouth every 8 (eight) hours as needed for migraine.    Past Month at Unknown time  . b complex vitamins tablet Take 1 tablet by mouth daily. With vitamin c 500 mg   Past Week at Unknown

## 2019-05-13 NOTE — H&P (Addendum)
CARDIOLOGY HISTORY AND PHYSICAL   Patient ID: Bridget [name] MRN: [id number]  DOB/AGE: June 06, 1940 79 y.o. Admit date: 05/12/2019  Primary Care Physician: Marylen Ponto, MD Primary Cardiologist: Dr. Rennis Golden   Clinical Summary Bridget [name] is a 79 y.o.female with known history of atrial flutter with 2-1 conduction newly diagnosed on 04/30/2019, who was treated as an outpatient with carvedilol and scheduled for TEE DCCV on 05/09/2019.  She had been placed on Eliquis 5 mg twice daily prior to the procedure she did have a successful DCCV per Dr. Rennis Golden with an EF measuring 30% to 35% with no atrial thrombus noted.  Within 24 hours she was seen in the ED with atrial for with RVR and treated with cardioversion which was temporarily successful.    She called her office today with complaints of racing heart rate and shortness of breath and profound fatigue.  She was added to my office schedule for evaluation.  Dr. Rennis Golden was made aware of her symptoms prior to this appointment being made.  In the office today EKG revealed atrial fibrillation with RVR heart rate of 157 bpm with nonspecific ST-T wave abnormalities.  She is symptomatic with shortness of breath, and orthopnea.  She does not have any overt symptoms of CHF with exception of some orthopnea.  She stated that her heart rate is causing her to feel some mild chest pressure.  Blood pressure in the office 132/86, weight 165 pounds, pulse 157 bpm.  Further history includes hypertension, arthritis, (currently not on any NSAIDs), chronic back pain seen by Dr. Jeral Fruit on gabapentin 3 times daily, hypothyroidism on levothyroxine 75 mcg daily..   Allergies  Allergen Reactions  . Codeine Anaphylaxis  . Sulfonamide Derivatives Anaphylaxis  . 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 Amlodipine Other (See Comments)    swelling    Home Medications Apixaban 5 mg p.o. twice daily Carvedilol 6.25 mg twice  daily Gabapentin 300 mg daily Imipramine 25 mg at at bedtime Irbesartan 75 mg daily Levothyroxine 75 mcg daily Promethazine 25 mg tablet as needed every 6 hours nausea Rosuvastatin 10 mg daily Terbinafine 250 mcg daily Tramadol 50 mg 1 to 2 tablets every 4 hours as needed moderate pain. Vitamin D 1000 max, 25 mcg daily  Scheduled Medications    Infusions    PRN Medications    Past Medical History:  Diagnosis Date  . Allergy   . Arthritis    hip OA  . Degenerative disc disease   . GERD (gastroesophageal reflux disease)   . Hyperlipidemia   . Hypertension   . Hypothyroidism   . Migraines   . Neuromuscular disorder (HCC)   . Neuropathy   . Seasonal allergies   . 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 CARDIOVERSION N/A 05/02/2019   Procedure: CARDIOVERSION;  Surgeon: Chrystie Nose, MD;  Location: [facility] ENDOSCOPY;  Service: Cardiovascular;  Laterality: N/A;  . CATARACT EXTRACTION, BILATERAL    . CERVICAL DISCECTOMY  2005   with fusion  . CHOLECYSTECTOMY  2006  . COLONOSCOPY    . LUMBAR LAMINECTOMY/DECOMPRESSION MICRODISCECTOMY Right 11/13/2014   Procedure: Right Lumbar four-five Microdiskectomy;  Surgeon: Hilda Lias, MD;  Location: [facility] NEURO ORS;  Service: Neurosurgery;  Laterality: Right;  right  . POLYPECTOMY    . 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 TEE WITHOUT CARDIOVERSION N/A 05/02/2019  Procedure: TRANSESOPHAGEAL ECHOCARDIOGRAM (TEE);  Surgeon: Chrystie Nose, MD;  Location: [facility] ENDOSCOPY;  Service: Cardiovascular;  Laterality: N/A;  . TOTAL ABDOMINAL HYSTERECTOMY  1978  . TOTAL HIP ARTHROPLASTY Left 04/2011  . TRANSTHORACIC ECHOCARDIOGRAM  12/2008   EF=>55%, borderline conc LVH; trace MR; mod TR; trace AV regurg    Family History  Problem Relation Age of Onset  . Stroke Mother 68  . Coronary artery disease Father 6  . Colon polyps Father   . Colon cancer Cousin   . Esophageal cancer  Cousin   . Stroke Maternal Grandfather   . Stroke Paternal Grandmother   . Breast cancer Paternal Grandmother   . Suicidality Daughter   . Rectal cancer Neg Hx   . Stomach cancer Neg Hx     Social History Bridget [name] reports that she has never smoked. She has never used smokeless tobacco. Bridget [name] reports no history of alcohol use.  Review of Systems Otherwise reviewed and negative except as outlined.  Physical Examination BP 132/86 HR 157 bpm, R 28. Weight 165 lbs. Ht 5'6""  Gen: Mildly dyspneic, slightly anxious HEENT: Conjunctiva and lids normal, oropharynx clear with moist mucosa. Neck: Supple, no elevated JVP or carotid bruits, no thyromegaly. Lungs: Clear to auscultation, nonlabored breathing at rest, orthopneic. Cardiac: Rapid irregular  rate and rhythm, no S3 or significant systolic murmur, no pericardial rub. Abdomen: Soft, nontender, no hepatomegaly, bowel sounds present, no guarding or rebound. Extremities: No pitting edema, distal pulses 2+. Skin: Warm and dry.  Pale Musculoskeletal: No kyphosis. Neuropsychiatric: Alert and oriented x3, affect grossly appropriate.   Lab Results  Basic Metabolic Panel: Recent Labs  Lab 05/10/19 0945 05/10/19 0945 05/12/19 1816 05/13/19 0600  NA 143  --  144 141  K 3.3*   < > 4.7 3.8  CL 108  --  108 107  CO2  --   --  27 24  GLUCOSE 116*  --  112* 124*  BUN 16  --  13 13  CREATININE 0.70  --  0.79 0.78  CALCIUM  --   --  9.6 9.2  MG  --   --  2.1  --    < > = values in this interval not displayed.    Liver Function Tests: Recent Labs  Lab 05/12/19 1816  AST 19  ALT 18  ALKPHOS 63  BILITOT 0.7  PROT 6.3*  ALBUMIN 3.6    CBC: Recent Labs  Lab 05/10/19 0945 05/12/19 1816  WBC  --  6.7  NEUTROABS  --  4.0  HGB 15.0 13.6  HCT 44.0 43.6  MCV  --  92.8  PLT  --  197    Cardiac Enzymes: No results for input(s): CKTOTAL, CKMB, CKMBINDEX, TROPONINI in the last 168 hours.  BNP: Invalid input(s):  POCBNP   Radiology DG CHEST PORT 1 VIEW  Result Date: 05/12/2019 CLINICAL DATA:  Congestive heart failure EXAM: PORTABLE CHEST 1 VIEW COMPARISON:  05/10/2019 FINDINGS: Single frontal view of the chest demonstrates an enlarged cardiac silhouette, stable. No airspace disease, effusion, or pneumothorax. No acute bony abnormalities. IMPRESSION: 1. Stable exam, no acute process. Electronically Signed   By: Sharlet [place] M.D.   On: 05/12/2019 19:58    Prior Cardiac Testing/Procedures:  05/02/2019 TEE 1. Left ventricular ejection fraction, by estimation, is 30 to 35%. The  left ventricle has moderately decreased function. The left ventricle  demonstrates global hypokinesis. The left ventricular internal cavity size  was mildly dilated. Left ventricular  diastolic function could not be evaluated.  2. Right ventricular systolic function is normal. The right ventricular  size is normal.  3. Left atrial size was mildly dilated. No left atrial/left atrial  appendage thrombus was detected.  4. The mitral valve is grossly normal. Mild mitral valve regurgitation.  5. The aortic valve is tricuspid. Aortic valve regurgitation is mild.  6. Cannot exclude small PFO.   ECG: Personally reviewed atrial fib with RVR heart rate of 57 bpm, left axis deviation with nonspecific ST-T wave abnormality noted.  Impression and Recommendations  1.  Persistent atrial fibrillation with RVR: Has had 2 cardioversions within the last 48 hours with return to atrial fib with RVR within 24 hours.  The patient will be admitted to progressive care unit started on amiodarone drip with transition to p.o. amiodarone.  She will continue on Eliquis 5 mg p.o. twice daily, labs to include TSH, repeat CBC, and LFTs will be completed for baseline information.  She will be followed by Dr. Rennis Golden who may consult EP at his discretion. She will be a direct admit to 3 E progressive care unit   I spoke with the patient and her husband who  are here in the office visit today to explain the need to admit and planned medication management.  Multiple questions are answered.  They verbalized understanding and she is willing to be admitted.  2.  Hypertension: Remains on carvedilol 6.25 mg twice daily.  This may be needed to be adjusted based upon her response to amiodarone.  3.  Systolic dysfunction: Ejection fraction per TEE revealed EF of 30 to 35% while in atrial fibrillation with left ventricle demonstrating global hypokinesis.  She may be having evidence of decompensated CHF symptoms however they are not overt on exam.  Will check chest x-ray and BNP.  Add IV diuresis as necessary.  4.  Hypothyroidism: Check TSH, continue levothyroxine.  Hospitalist consultation at the discretion of admitting physician.  Signed: Bettey Mare. Lawrence DNP, ANP, AACC  05/13/2019, 7:08 PM  Pt. Seen and examined. Agree with the Resident/NP/PA-C note as written.  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 fib with RVR which have been persistent for weeks.  She converted to sinus rhythm however and apparently maintained it feeling better for several days and then reverted back to A. fib.  Ultimately she was seen back in the emergency department.  She underwent repeat cardioversion but then reverted back to atrial fibrillation again this past weekend.  She was seen in the office today as she reportedly is now feeling very poorly and is noted to be in A. fib with RVR.  I discussed with Lorin Picket, DNP, about a plan of starting outpatient amiodarone, however as the patient felt significantly symptomatic, it was felt that she may do better with IV amiodarone loading for adequate rate control and further evaluation for possible heart failure symptoms.  Based on that, we plan to admit her directly to [facility].  On exam today, she is  noted to be pale, and in no acute distress, heart irregularly irregular and tachycardic, lungs clear, no edema, JVP mildly elevated, abdomen soft, nontender, no focal neurologic deficits, anxious.  This is a 79 year old patient of mine with recurrent A. fib with RVR as well as recent cardiomyopathy likely tachycardia mediated.  Will obtain lab work including a BNP and start on IV  amiodarone.  She has been anticoagulated consistently.  Once she is adequately loaded for more than 24 hours we will consider a repeat cardioversion attempt, possibly on Wednesday.  Time Spent with Patient: I have spent a total of 45 minutes with patient reviewing hospital notes, telemetry, EKGs, labs and examining the patient as well as establishing an assessment and plan that was discussed with the patient.>50% of time was spent in direct patient care.  Chrystie Nose, MD, [facility], FACP  [place]  [facility]  Medical Director of the [facility]  Cardiovascular Risk Reduction Clinic Diplomate of the American Board of Clinical Lipidology Attending Cardiologist  Direct Dial: [phone]  Fax: [phone]  Website:  [email or web address]

## 2019-05-13 NOTE — Progress Notes (Signed)
Patients HR is atrial flutter with rate of 100-125. Patient is on an amio drip and oral beta blocker to help with rate control. Cardiology aware of HR

## 2019-05-14 ENCOUNTER — Other Ambulatory Visit: Payer: Self-pay | Admitting: Cardiology

## 2019-05-14 ENCOUNTER — Encounter (HOSPITAL_COMMUNITY): Payer: Self-pay | Admitting: Internal Medicine

## 2019-05-14 ENCOUNTER — Inpatient Hospital Stay (HOSPITAL_COMMUNITY): Payer: Medicare HMO | Admitting: Anesthesiology

## 2019-05-14 ENCOUNTER — Encounter (HOSPITAL_COMMUNITY)
Admission: AD | Disposition: A | Discharge status: Home / Self Care | Payer: Self-pay | Source: Ambulatory Visit | Attending: Internal Medicine

## 2019-05-14 DIAGNOSIS — I5021 Acute systolic (congestive) heart failure: Secondary | ICD-10-CM

## 2019-05-14 DIAGNOSIS — I4891 Unspecified atrial fibrillation: Secondary | ICD-10-CM

## 2019-05-14 DIAGNOSIS — E782 Mixed hyperlipidemia: Secondary | ICD-10-CM

## 2019-05-14 DIAGNOSIS — I483 Typical atrial flutter: Secondary | ICD-10-CM

## 2019-05-14 HISTORY — PX: CARDIOVERSION: SHX1299

## 2019-05-14 LAB — BASIC METABOLIC PANEL
Anion gap: 10 (ref 5–15)
BUN: 10 mg/dL (ref 8–23)
CO2: 28 mmol/L (ref 22–32)
Calcium: 9.8 mg/dL (ref 8.9–10.3)
Chloride: 104 mmol/L (ref 98–111)
Creatinine, Ser: 0.88 mg/dL (ref 0.44–1.00)
GFR calc Af Amer: >60 mL/min (ref >60)
GFR calc non Af Amer: >60 mL/min (ref >60)
Glucose, Bld: 115 mg/dL — ABNORMAL HIGH (ref 70–99)
Potassium: 4 mmol/L (ref 3.5–5.1)
Sodium: 142 mmol/L (ref 135–145)

## 2019-05-14 LAB — T3: T3, Total: 70 ng/dL — ABNORMAL LOW (ref 71–180)

## 2019-05-14 SURGERY — CARDIOVERSION
Anesthesia: General

## 2019-05-14 MED ORDER — FUROSEMIDE 20 MG PO TABS: 20.0000 mg | ORAL_TABLET | Freq: Every day | ORAL | 1 refills | Status: DC

## 2019-05-14 MED ORDER — AMIODARONE HCL 200 MG PO TABS: 200.0000 mg | ORAL_TABLET | Freq: Two times a day (BID) | ORAL | Status: DC

## 2019-05-14 MED ORDER — SODIUM CHLORIDE 0.9 % IV SOLN: INTRAVENOUS | Status: DC | PRN

## 2019-05-14 MED ORDER — AMIODARONE HCL 200 MG PO TABS: 200.0000 mg | ORAL_TABLET | Freq: Two times a day (BID) | ORAL | 0 refills | Status: DC

## 2019-05-14 MED ORDER — GABAPENTIN 300 MG PO CAPS
300.0000 mg | ORAL_CAPSULE | Freq: Every day | ORAL | Status: DC
  Administered 2019-05-14: 300 mg via ORAL
  Filled 2019-05-14: qty 1

## 2019-05-14 MED ORDER — PROPOFOL 10 MG/ML IV BOLUS
INTRAVENOUS | Status: DC | PRN
  Administered 2019-05-14: 60 mg via INTRAVENOUS

## 2019-05-14 MED ORDER — LIDOCAINE 2% (20 MG/ML) 5 ML SYRINGE
INTRAMUSCULAR | Status: DC | PRN
  Administered 2019-05-14: 60 mg via INTRAVENOUS

## 2019-05-14 MED ORDER — ROSUVASTATIN CALCIUM 20 MG PO TABS: 20.0000 mg | ORAL_TABLET | Freq: Every day | ORAL | 3 refills | Status: DC

## 2019-05-14 NOTE — Plan of Care (Signed)
Problem: Education: Goal: Ability to demonstrate management of disease process will improve 05/14/2019 1327 by Champ Mungo, RN Outcome: Adequate for Discharge 05/14/2019 1327 by Champ Mungo, RN Outcome: Adequate for Discharge Goal: Ability to verbalize understanding of medication therapies will improve 05/14/2019 1327 by Champ Mungo, RN Outcome: Adequate for Discharge 05/14/2019 1327 by Champ Mungo, RN Outcome: Adequate for Discharge Goal: Individualized Educational Video(s) 05/14/2019 1327 by Champ Mungo, RN Outcome: Adequate for Discharge 05/14/2019 1327 by Champ Mungo, RN Outcome: Adequate for Discharge   Problem: Activity: Goal: Capacity to carry out activities will improve 05/14/2019 1327 by Champ Mungo, RN Outcome: Adequate for Discharge 05/14/2019 1327 by Champ Mungo, RN Outcome: Adequate for Discharge   Problem: Cardiac: Goal: Ability to achieve and maintain adequate cardiopulmonary perfusion will improve 05/14/2019 1327 by Champ Mungo, RN Outcome: Adequate for Discharge 05/14/2019 1327 by Champ Mungo, RN Outcome: Adequate for Discharge   Problem: Education: Goal: Knowledge of General Education information will improve Description: Including pain rating scale, medication(s)/side effects and non-pharmacologic comfort measures 05/14/2019 1327 by Champ Mungo, RN Outcome: Adequate for Discharge 05/14/2019 1327 by Champ Mungo, RN Outcome: Adequate for Discharge   Problem: Health Behavior/Discharge Planning: Goal: Ability to manage health-related needs will improve 05/14/2019 1327 by Champ Mungo, RN Outcome: Adequate for Discharge 05/14/2019 1327 by Champ Mungo, RN Outcome: Adequate for Discharge   Problem: Clinical Measurements: Goal: Ability to maintain clinical measurements within normal limits will improve 05/14/2019 1327 by Champ Mungo, RN Outcome: Adequate for Discharge 05/14/2019 1327 by Champ Mungo, RN Outcome: Adequate for Discharge Goal: Will remain free from infection 05/14/2019 1327 by Champ Mungo, RN Outcome: Adequate for Discharge 05/14/2019 1327 by Champ Mungo, RN Outcome: Adequate for Discharge Goal: Diagnostic test results will improve 05/14/2019 1327 by Champ Mungo, RN Outcome: Adequate for Discharge 05/14/2019 1327 by Champ Mungo, RN Outcome: Adequate for Discharge Goal: Respiratory complications will improve 05/14/2019 1327 by Champ Mungo, RN Outcome: Adequate for Discharge 05/14/2019 1327 by Champ Mungo, RN Outcome: Adequate for Discharge Goal: Cardiovascular complication will be avoided 05/14/2019 1327 by Champ Mungo, RN Outcome: Adequate for Discharge 05/14/2019 1327 by Champ Mungo, RN Outcome: Adequate for Discharge   Problem: Activity: Goal: Risk for activity intolerance will decrease 05/14/2019 1327 by Champ Mungo, RN Outcome: Adequate for Discharge 05/14/2019 1327 by Champ Mungo, RN Outcome: Adequate for Discharge   Problem: Nutrition: Goal: Adequate nutrition will be maintained 05/14/2019 1327 by Champ Mungo, RN Outcome: Adequate for Discharge 05/14/2019 1327 by Champ Mungo, RN Outcome: Adequate for Discharge   Problem: Coping: Goal: Level of anxiety will decrease 05/14/2019 1327 by Champ Mungo, RN Outcome: Adequate for Discharge 05/14/2019 1327 by Champ Mungo, RN Outcome: Adequate for Discharge   Problem: Elimination: Goal: Will not experience complications related to bowel motility 05/14/2019 1327 by Champ Mungo, RN Outcome: Adequate for Discharge 05/14/2019 1327 by Champ Mungo, RN Outcome: Adequate for Discharge Goal: Will not experience complications related to urinary retention 05/14/2019 1327 by Champ Mungo, RN Outcome: Adequate for Discharge 05/14/2019 1327 by Champ Mungo, RN Outcome: Adequate for Discharge   Problem: Pain Managment: Goal: General  experience of comfort will improve 05/14/2019 1327 by Champ Mungo, RN Outcome: Adequate for Discharge 05/14/2019 1327 by Champ Mungo, RN Outcome: Adequate for Discharge   Problem: Safety: Goal: Ability to remain  free from injury will improve 05/14/2019 1327 by Champ Mungo, RN Outcome: Adequate for Discharge 05/14/2019 1327 by Champ Mungo, RN Outcome: Adequate for Discharge   Problem: Skin Integrity: Goal: Risk for impaired skin integrity will decrease 05/14/2019 1327 by Champ Mungo, RN Outcome: Adequate for Discharge 05/14/2019 1327 by Champ Mungo, RN Outcome: Adequate for Discharge

## 2019-05-14 NOTE — CV Procedure (Signed)
Procedure: Electrical Cardioversion Indications:  Atrial Fibrillation  Procedure Details:  Consent: Risks of procedure as well as the alternatives and risks of each were explained to the (patient/caregiver).  Consent for procedure obtained.  Time Out: Verified patient identification, verified procedure, site/side was marked, verified correct patient position, special equipment/implants available, medications/allergies/relevent history reviewed, required imaging and test results available. PERFORMED.  Patient placed on cardiac monitor, pulse oximetry, supplemental oxygen as necessary.  Sedation given: propofol and lidocaine per anesthesia Pacer pads placed anterior and posterior chest.  Cardioverted 1 time(s).  Cardioversion with synchronized biphasic 120J shock.  Evaluation: Findings: Post procedure EKG shows: NSR Complications: None Patient did tolerate procedure well.  Time Spent Directly with the Patient:  30 minutes   [name] 05/14/2019, 9:24 AM

## 2019-05-14 NOTE — Interval H&P Note (Signed)
History and Physical Interval Note:  05/14/2019 9:03 AM  Bridget [name]  has presented today for surgery, with the diagnosis of AFIB.  The various methods of treatment have been discussed with the patient and family. After consideration of risks, benefits and other options for treatment, the patient has consented to  Procedure(s): CARDIOVERSION (N/A) as a surgical intervention.  The patient's history has been reviewed, patient examined, no change in status, stable for surgery.  I have reviewed the patient's chart and labs.  Questions were answered to the patient's satisfaction.     [name]

## 2019-05-14 NOTE — Discharge Summary (Addendum)
Weight:       165.8 lb Date of Birth:  1941-03-19        BSA:          1.85 m Patient Age:    79 years        BP:           105/75 mmHg Patient Gender: F               HR:           137 bpm. Exam Location:  Inpatient Procedure: Transesophageal Echo, Color Doppler and Cardiac Doppler Indications:     A-flutter with RVR i48.92  History:         Patient has prior history of Echocardiogram examinations, most                  recent 12/19/2010. Arrythmias:Atrial Flutter; Risk                  Factors:Hypertension and Dyslipidemia.  Sonographer:     Raquel Sarna Senior RDCS Referring Phys:  [id number] Nadean Corwin HILTY Diagnosing Phys: Lyman Bishop MD PROCEDURE: After discussion of the risks and benefits of a TEE, an informed consent was obtained from the patient. The transesophogeal probe was passed without difficulty through the esophogus of the patient. Sedation performed by different physician. The patient was monitored while under deep sedation. Anesthestetic sedation was provided intravenously by Anesthesiology: 163mg  of Propofol, 100mg  of Lidocaine. The patient developed no complications during the procedure. A successful direct current cardioversion was performed. IMPRESSIONS  1. Left ventricular  ejection fraction, by estimation, is 30 to 35%. The left ventricle has moderately decreased function. The left ventricle demonstrates global hypokinesis. The left ventricular internal cavity size was mildly dilated. Left ventricular diastolic function could not be evaluated.  2. Right ventricular systolic function is normal. The right ventricular size is normal.  3. Left atrial size was mildly dilated. No left atrial/left atrial appendage thrombus was detected.  4. The mitral valve is grossly normal. Mild mitral valve regurgitation.  5. The aortic valve is tricuspid. Aortic valve regurgitation is mild.  6. Cannot exclude small PFO. Conclusion(s)/Recomendation(s): No LA/LAA thrombus identified. Successful cardioversion performed with restoration of normal sinus rhythm. FINDINGS  Left Ventricle: Left ventricular ejection fraction, by estimation, is 30 to 35%. The left ventricle has moderately decreased function. The left ventricle demonstrates global hypokinesis. The left ventricular internal cavity size was mildly dilated. There is no left ventricular hypertrophy. Left ventricular diastolic function could not be evaluated. Right Ventricle: The right ventricular size is normal. No increase in right ventricular wall thickness. Right ventricular systolic function is normal. Left Atrium: Left atrial size was mildly dilated. No left atrial/left atrial appendage thrombus was detected. Right Atrium: Right atrial size was normal in size. Pericardium: There is no evidence of pericardial effusion. Mitral Valve: The mitral valve is grossly normal. Mild mitral valve regurgitation. Tricuspid Valve: The tricuspid valve is grossly normal. Tricuspid valve regurgitation is mild. Aortic Valve: The aortic valve is tricuspid. Aortic valve regurgitation is mild. Pulmonic Valve: The pulmonic valve was grossly normal. Pulmonic valve regurgitation is not visualized. Aorta: The aortic root and ascending aorta are structurally normal, with no  evidence of dilitation. IAS/Shunts: Cannot exclude small PFO. Lyman Bishop MD Electronically signed by Lyman Bishop MD Signature Date/Time: 05/02/2019/11:08:59 AM    Final    Disposition   Pt is being discharged home today in good condition.  Follow-up Plans & Appointments    Follow-up  Discharge Summary    Patient ID: Bridget [name] MRN: [id number]; DOB: Oct 07, 1940  Admit date: 05/12/2019 Discharge date: 05/14/2019  Primary Care Provider: Ronita Hipps, MD  Primary Cardiologist: Pixie Casino, MD  Primary Electrophysiologist:  None   Discharge Diagnoses    Principal Problem:   Atrial fibrillation with RVR [facility]) Active Problems:   Hypothyroidism   Hyperlipidemia   Essential hypertension   Atrial flutter ([place])   Acute systolic (congestive) heart failure [facility])    Diagnostic Studies/Procedures    Electrical cardioversion 05/14/2019 Procedure Details:  Consent: Risks of procedure as well as the alternatives and risks of each were explained to the (patient/caregiver).  Consent for procedure obtained.  Time Out: Verified patient identification, verified procedure, site/side was marked, verified correct patient position, special equipment/implants available, medications/allergies/relevent history reviewed, required imaging and test results available. PERFORMED.  Patient placed on cardiac monitor, pulse oximetry, supplemental oxygen as necessary.  Sedation given: propofol and lidocaine per anesthesia Pacer pads placed anterior and posterior chest.  Cardioverted 1 time(s).  Cardioversion with synchronized biphasic 120J shock.  Evaluation: Findings: Post procedure EKG shows: NSR Complications: None Patient did tolerate procedure well. _____________   History of Present Illness     Bridget [name] is a 79 y.o. female with Bridget [name] is a 79 y.o.female with past medical history significant for hypertension, arthritis (not currently on NSAIDs), chronic back pain on gabapentin, hypothyroidism and known history of atrial flutter with 2-1 conduction newly diagnosed on 04/30/2019, who was treated as an outpatient with carvedilol and scheduled for TEE DCCV on 05/09/2019.  She had been placed on Eliquis 5 mg twice daily prior to the procedure she did have a  successful DCCV per Dr. Debara Pickett with an EF measuring 30% to 35% with no atrial thrombus noted, presumed tachycardia mediated cardiomyopathy.  Within 24 hours she was seen in the ED with atrial for with RVR and treated with cardioversion which was temporarily successful.    She called our office on 05/12/2019 with complaints of racing heart rate and shortness of breath and profound fatigue.  In the office EKG revealed atrial fibrillation with RVR heart rate of 157 bpm with nonspecific ST-T wave abnormalities.  She was symptomatic with shortness of breath, and orthopnea.  She did not have any overt symptoms of CHF with exception of some orthopnea.  She stated that her heart rate is causing her to feel some mild chest pressure.  Blood pressure in the office 132/86, weight 165 pounds, pulse 157 bpm.  The patient was referred to the hospital for admission and initiation of amiodarone.   Hospital Course     Consultants: None  Bridget [name] was loaded with IV amiodarone and underwent electrical cardioversion this morning, achieving sinus rhythm. She is feeling better. She was seen by EP, Dr. Rayann Heman who agreed with antiarrhymia drug therapy and could consider ablation in the future. He recommended follow up in the afib clinic in 1 week for further ablation discussion.   She has diuresed 1.2 L.  Feeling much better.  Amiodarone has been converted to oral dosing at 200 mg BID. She will be discharged on lasix 20 mg daily. Plan for repeat echo in 2 weeks prior to her scheduled appointment with Dr. Debara Pickett on 3/12.   Thyroid numbers are borderline - would not adjust meds.   LDL above target - crestor increased to 20 mg daily.  Patient has been seen by Dr. Debara Pickett today and deemed ready for discharge home. All  Discharge Summary    Patient ID: Bridget [name] MRN: [id number]; DOB: Oct 07, 1940  Admit date: 05/12/2019 Discharge date: 05/14/2019  Primary Care Provider: Ronita Hipps, MD  Primary Cardiologist: Pixie Casino, MD  Primary Electrophysiologist:  None   Discharge Diagnoses    Principal Problem:   Atrial fibrillation with RVR [facility]) Active Problems:   Hypothyroidism   Hyperlipidemia   Essential hypertension   Atrial flutter ([place])   Acute systolic (congestive) heart failure [facility])    Diagnostic Studies/Procedures    Electrical cardioversion 05/14/2019 Procedure Details:  Consent: Risks of procedure as well as the alternatives and risks of each were explained to the (patient/caregiver).  Consent for procedure obtained.  Time Out: Verified patient identification, verified procedure, site/side was marked, verified correct patient position, special equipment/implants available, medications/allergies/relevent history reviewed, required imaging and test results available. PERFORMED.  Patient placed on cardiac monitor, pulse oximetry, supplemental oxygen as necessary.  Sedation given: propofol and lidocaine per anesthesia Pacer pads placed anterior and posterior chest.  Cardioverted 1 time(s).  Cardioversion with synchronized biphasic 120J shock.  Evaluation: Findings: Post procedure EKG shows: NSR Complications: None Patient did tolerate procedure well. _____________   History of Present Illness     Bridget [name] is a 79 y.o. female with Bridget [name] is a 79 y.o.female with past medical history significant for hypertension, arthritis (not currently on NSAIDs), chronic back pain on gabapentin, hypothyroidism and known history of atrial flutter with 2-1 conduction newly diagnosed on 04/30/2019, who was treated as an outpatient with carvedilol and scheduled for TEE DCCV on 05/09/2019.  She had been placed on Eliquis 5 mg twice daily prior to the procedure she did have a  successful DCCV per Dr. Debara Pickett with an EF measuring 30% to 35% with no atrial thrombus noted, presumed tachycardia mediated cardiomyopathy.  Within 24 hours she was seen in the ED with atrial for with RVR and treated with cardioversion which was temporarily successful.    She called our office on 05/12/2019 with complaints of racing heart rate and shortness of breath and profound fatigue.  In the office EKG revealed atrial fibrillation with RVR heart rate of 157 bpm with nonspecific ST-T wave abnormalities.  She was symptomatic with shortness of breath, and orthopnea.  She did not have any overt symptoms of CHF with exception of some orthopnea.  She stated that her heart rate is causing her to feel some mild chest pressure.  Blood pressure in the office 132/86, weight 165 pounds, pulse 157 bpm.  The patient was referred to the hospital for admission and initiation of amiodarone.   Hospital Course     Consultants: None  Bridget [name] was loaded with IV amiodarone and underwent electrical cardioversion this morning, achieving sinus rhythm. She is feeling better. She was seen by EP, Dr. Rayann Heman who agreed with antiarrhymia drug therapy and could consider ablation in the future. He recommended follow up in the afib clinic in 1 week for further ablation discussion.   She has diuresed 1.2 L.  Feeling much better.  Amiodarone has been converted to oral dosing at 200 mg BID. She will be discharged on lasix 20 mg daily. Plan for repeat echo in 2 weeks prior to her scheduled appointment with Dr. Debara Pickett on 3/12.   Thyroid numbers are borderline - would not adjust meds.   LDL above target - crestor increased to 20 mg daily.  Patient has been seen by Dr. Debara Pickett today and deemed ready for discharge home. All  Weight:       165.8 lb Date of Birth:  1941-03-19        BSA:          1.85 m Patient Age:    79 years        BP:           105/75 mmHg Patient Gender: F               HR:           137 bpm. Exam Location:  Inpatient Procedure: Transesophageal Echo, Color Doppler and Cardiac Doppler Indications:     A-flutter with RVR i48.92  History:         Patient has prior history of Echocardiogram examinations, most                  recent 12/19/2010. Arrythmias:Atrial Flutter; Risk                  Factors:Hypertension and Dyslipidemia.  Sonographer:     Raquel Sarna Senior RDCS Referring Phys:  [id number] Nadean Corwin HILTY Diagnosing Phys: Lyman Bishop MD PROCEDURE: After discussion of the risks and benefits of a TEE, an informed consent was obtained from the patient. The transesophogeal probe was passed without difficulty through the esophogus of the patient. Sedation performed by different physician. The patient was monitored while under deep sedation. Anesthestetic sedation was provided intravenously by Anesthesiology: 163mg  of Propofol, 100mg  of Lidocaine. The patient developed no complications during the procedure. A successful direct current cardioversion was performed. IMPRESSIONS  1. Left ventricular  ejection fraction, by estimation, is 30 to 35%. The left ventricle has moderately decreased function. The left ventricle demonstrates global hypokinesis. The left ventricular internal cavity size was mildly dilated. Left ventricular diastolic function could not be evaluated.  2. Right ventricular systolic function is normal. The right ventricular size is normal.  3. Left atrial size was mildly dilated. No left atrial/left atrial appendage thrombus was detected.  4. The mitral valve is grossly normal. Mild mitral valve regurgitation.  5. The aortic valve is tricuspid. Aortic valve regurgitation is mild.  6. Cannot exclude small PFO. Conclusion(s)/Recomendation(s): No LA/LAA thrombus identified. Successful cardioversion performed with restoration of normal sinus rhythm. FINDINGS  Left Ventricle: Left ventricular ejection fraction, by estimation, is 30 to 35%. The left ventricle has moderately decreased function. The left ventricle demonstrates global hypokinesis. The left ventricular internal cavity size was mildly dilated. There is no left ventricular hypertrophy. Left ventricular diastolic function could not be evaluated. Right Ventricle: The right ventricular size is normal. No increase in right ventricular wall thickness. Right ventricular systolic function is normal. Left Atrium: Left atrial size was mildly dilated. No left atrial/left atrial appendage thrombus was detected. Right Atrium: Right atrial size was normal in size. Pericardium: There is no evidence of pericardial effusion. Mitral Valve: The mitral valve is grossly normal. Mild mitral valve regurgitation. Tricuspid Valve: The tricuspid valve is grossly normal. Tricuspid valve regurgitation is mild. Aortic Valve: The aortic valve is tricuspid. Aortic valve regurgitation is mild. Pulmonic Valve: The pulmonic valve was grossly normal. Pulmonic valve regurgitation is not visualized. Aorta: The aortic root and ascending aorta are structurally normal, with no  evidence of dilitation. IAS/Shunts: Cannot exclude small PFO. Lyman Bishop MD Electronically signed by Lyman Bishop MD Signature Date/Time: 05/02/2019/11:08:59 AM    Final    Disposition   Pt is being discharged home today in good condition.  Follow-up Plans & Appointments    Follow-up  Weight:       165.8 lb Date of Birth:  1941-03-19        BSA:          1.85 m Patient Age:    79 years        BP:           105/75 mmHg Patient Gender: F               HR:           137 bpm. Exam Location:  Inpatient Procedure: Transesophageal Echo, Color Doppler and Cardiac Doppler Indications:     A-flutter with RVR i48.92  History:         Patient has prior history of Echocardiogram examinations, most                  recent 12/19/2010. Arrythmias:Atrial Flutter; Risk                  Factors:Hypertension and Dyslipidemia.  Sonographer:     Raquel Sarna Senior RDCS Referring Phys:  [id number] Nadean Corwin HILTY Diagnosing Phys: Lyman Bishop MD PROCEDURE: After discussion of the risks and benefits of a TEE, an informed consent was obtained from the patient. The transesophogeal probe was passed without difficulty through the esophogus of the patient. Sedation performed by different physician. The patient was monitored while under deep sedation. Anesthestetic sedation was provided intravenously by Anesthesiology: 163mg  of Propofol, 100mg  of Lidocaine. The patient developed no complications during the procedure. A successful direct current cardioversion was performed. IMPRESSIONS  1. Left ventricular  ejection fraction, by estimation, is 30 to 35%. The left ventricle has moderately decreased function. The left ventricle demonstrates global hypokinesis. The left ventricular internal cavity size was mildly dilated. Left ventricular diastolic function could not be evaluated.  2. Right ventricular systolic function is normal. The right ventricular size is normal.  3. Left atrial size was mildly dilated. No left atrial/left atrial appendage thrombus was detected.  4. The mitral valve is grossly normal. Mild mitral valve regurgitation.  5. The aortic valve is tricuspid. Aortic valve regurgitation is mild.  6. Cannot exclude small PFO. Conclusion(s)/Recomendation(s): No LA/LAA thrombus identified. Successful cardioversion performed with restoration of normal sinus rhythm. FINDINGS  Left Ventricle: Left ventricular ejection fraction, by estimation, is 30 to 35%. The left ventricle has moderately decreased function. The left ventricle demonstrates global hypokinesis. The left ventricular internal cavity size was mildly dilated. There is no left ventricular hypertrophy. Left ventricular diastolic function could not be evaluated. Right Ventricle: The right ventricular size is normal. No increase in right ventricular wall thickness. Right ventricular systolic function is normal. Left Atrium: Left atrial size was mildly dilated. No left atrial/left atrial appendage thrombus was detected. Right Atrium: Right atrial size was normal in size. Pericardium: There is no evidence of pericardial effusion. Mitral Valve: The mitral valve is grossly normal. Mild mitral valve regurgitation. Tricuspid Valve: The tricuspid valve is grossly normal. Tricuspid valve regurgitation is mild. Aortic Valve: The aortic valve is tricuspid. Aortic valve regurgitation is mild. Pulmonic Valve: The pulmonic valve was grossly normal. Pulmonic valve regurgitation is not visualized. Aorta: The aortic root and ascending aorta are structurally normal, with no  evidence of dilitation. IAS/Shunts: Cannot exclude small PFO. Lyman Bishop MD Electronically signed by Lyman Bishop MD Signature Date/Time: 05/02/2019/11:08:59 AM    Final    Disposition   Pt is being discharged home today in good condition.  Follow-up Plans & Appointments    Follow-up

## 2019-05-14 NOTE — Anesthesia Postprocedure Evaluation (Signed)
Anesthesia Post Note  Patient: Bridget [name]  Procedure(s) Performed: CARDIOVERSION (N/A )     Patient location during evaluation: Endoscopy Anesthesia Type: General Level of consciousness: awake and alert Pain management: pain level controlled Vital Signs Assessment: post-procedure vital signs reviewed and stable Respiratory status: spontaneous breathing, nonlabored ventilation, respiratory function stable and patient connected to nasal cannula oxygen Cardiovascular status: blood pressure returned to baseline and stable Postop Assessment: no apparent nausea or vomiting Anesthetic complications: no    Last Vitals:  Vitals:   05/14/19 1000 05/14/19 1014  BP: 128/73 128/77  Pulse: (!) 56 62  Resp: 14   Temp:    SpO2: 96%     Last Pain:  Vitals:   05/14/19 0839  TempSrc: Oral  PainSc: 0-No pain                 Barnet Glasgow

## 2019-05-14 NOTE — Progress Notes (Addendum)
DAILY PROGRESS NOTE   Patient Name: Bridget [name] Date of Encounter: 05/14/2019 Cardiologist: No primary care provider on file.  Chief Complaint   Feels great  Patient Profile   79 yo female with recurrent afib/flutter, acute systolic heart failure, admitted with recurrent afib with RVR and fatigue/shortness of breath.  Subjective   Cardioverted this am - maintaining sinus. Feels much better. Breathing well. BP elevated last pm, but normal today. Diuresed about 1.2L negative. Labs stable.   Objective   Vitals:   05/14/19 0930 05/14/19 0945 05/14/19 1000 05/14/19 1014  BP: 118/60 120/68 128/73 128/77  Pulse: 60 (!) 59 (!) 56 62  Resp: 14 15 14    Temp:      TempSrc:      SpO2: 94% 95% 96%   Weight:      Height:        Intake/Output Summary (Last 24 hours) at 05/14/2019 1109 Last data filed at 05/14/2019 [id number] Gross per 24 hour  Intake 690.79 ml  Output 1550 ml  Net -859.21 ml   Filed Weights   05/13/19 0315 05/14/19 0423 05/14/19 0839  Weight: 74.2 kg 72.9 kg 72.9 kg    Physical Exam   General appearance: alert, no distress and pale Neck: no carotid bruit, no JVD and thyroid not enlarged, symmetric, no tenderness/mass/nodules Lungs: clear to auscultation bilaterally Heart: regular rate and rhythm Abdomen: soft, non-tender; bowel sounds normal; no masses,  no organomegaly Extremities: extremities normal, atraumatic, no cyanosis or edema Pulses: 2+ and symmetric Skin: Skin color, texture, turgor normal. No rashes or lesions Neurologic: Grossly normal Psych: Pleasant  Inpatient Medications    Scheduled Meds: . apixaban  5 mg Oral BID  . carvedilol  6.25 mg Oral BID WC  . furosemide  20 mg Oral Daily  . gabapentin  300 mg Oral QHS  . levothyroxine  75 mcg Oral Q0600  . rosuvastatin  20 mg Oral q1800    Continuous Infusions: . amiodarone 30 mg/hr (05/14/19 0505)    PRN Meds: acetaminophen, nitroGLYCERIN, ondansetron (ZOFRAN) IV, traMADol, zolpidem    Labs   Results for orders placed or performed during the hospital encounter of 05/12/19 (from the past 48 hour(s))  Comprehensive metabolic panel     Status: Abnormal   Collection Time: 05/12/19  6:16 PM  Result Value Ref Range   Sodium 144 135 - 145 mmol/L   Potassium 4.7 3.5 - 5.1 mmol/L   Chloride 108 98 - 111 mmol/L   CO2 27 22 - 32 mmol/L   Glucose, Bld 112 (H) 70 - 99 mg/dL   BUN 13 8 - 23 mg/dL   Creatinine, Ser 0.79 0.44 - 1.00 mg/dL   Calcium 9.6 8.9 - 10.3 mg/dL   Total Protein 6.3 (L) 6.5 - 8.1 g/dL   Albumin 3.6 3.5 - 5.0 g/dL   AST 19 15 - 41 U/L   ALT 18 0 - 44 U/L   Alkaline Phosphatase 63 38 - 126 U/L   Total Bilirubin 0.7 0.3 - 1.2 mg/dL   GFR calc non Af Amer >60 >60 mL/min   GFR calc Af Amer >60 >60 mL/min   Anion gap 9 5 - 15    Comment: Performed at [facility], [address] [place], [place] [address]  TSH     Status: Abnormal   Collection Time: 05/12/19  6:16 PM  Result Value Ref Range   TSH 4.703 (H) 0.350 - 4.500 uIU/mL    Comment: Performed by  a 3rd Generation assay with a functional sensitivity of <=0.01 uIU/mL. Performed at [facility], [place] [address] [place], [place] [address]   Brain natriuretic peptide     Status: Abnormal   Collection Time: 05/12/19  6:16 PM  Result Value Ref Range   B Natriuretic Peptide 228.8 (H) 0.0 - 100.0 pg/mL    Comment: Performed at [facility] [address] [place], [place] [address]  CBC WITH DIFFERENTIAL     Status: None   Collection Time: 05/12/19  6:16 PM  Result Value Ref Range   WBC 6.7 4.0 - 10.5 K/uL   RBC 4.70 3.87 - 5.11 MIL/uL   Hemoglobin 13.6 12.0 - 15.0 g/dL   HCT 43.6 36.0 - 46.0 %   MCV 92.8 80.0 - 100.0 fL   MCH 28.9 26.0 - 34.0 pg   MCHC 31.2 30.0 - 36.0 g/dL   RDW 14.3 11.5 - 15.5 %   Platelets 197 150 - 400 K/uL   nRBC 0.0 0.0 - 0.2 %   Neutrophils Relative % 58 %   Neutro Abs 4.0 1.7 - 7.7 K/uL   Lymphocytes Relative 29 %   Lymphs Abs 1.9 0.7 - 4.0 K/uL    Monocytes Relative 10 %   Monocytes Absolute 0.7 0.1 - 1.0 K/uL   Eosinophils Relative 2 %   Eosinophils Absolute 0.1 0.0 - 0.5 K/uL   Basophils Relative 1 %   Basophils Absolute 0.0 0.0 - 0.1 K/uL   Immature Granulocytes 0 %   Abs Immature Granulocytes 0.02 0.00 - 0.07 K/uL    Comment: Performed at [facility], [address] [place], [place] [address]  Magnesium     Status: None   Collection Time: 05/12/19  6:16 PM  Result Value Ref Range   Magnesium 2.1 1.7 - 2.4 mg/dL    Comment: Performed at [facility] [address] [place], [place] [address]  SARS CORONAVIRUS 2 (TAT 6-24 HRS) Nasopharyngeal Nasopharyngeal Swab     Status: None   Collection Time: 05/12/19  7:16 PM   Specimen: Nasopharyngeal Swab  Result Value Ref Range   SARS Coronavirus 2 NEGATIVE NEGATIVE    Comment: (NOTE) SARS-CoV-2 target nucleic acids are NOT DETECTED. The SARS-CoV-2 RNA is generally detectable in upper and lower respiratory specimens during the acute phase of infection. Negative results do not preclude SARS-CoV-2 infection, do not rule out co-infections with other pathogens, and should not be used as the sole basis for treatment or other patient management decisions. Negative results must be combined with clinical observations, patient history, and epidemiological information. The expected result is Negative. Fact Sheet for Patients: SugarRoll.be Fact Sheet for Healthcare Providers: https://www.woods-mathews.com/ This test is not yet approved or cleared by the [place] FDA and  has been authorized for detection and/or diagnosis of SARS-CoV-2 by FDA under an Emergency Use Authorization (EUA). This EUA will remain  in effect (meaning this test can be used) for the duration of the COVID-19 declaration under Section 56 4(b)(1) of the Act, 21 U.S.C. section 360bbb-3(b)(1), unless the authorization is terminated or revoked sooner. Performed  at [facility], [place] [address], [place], [place] [address]   Lipid panel     Status: Abnormal   Collection Time: 05/13/19  6:00 AM  Result Value Ref Range   Cholesterol 221 (H) 0 - 200 mg/dL   Triglycerides 124 <150 mg/dL   HDL 48 >40 mg/dL   Total CHOL/HDL Ratio  4.6 RATIO   VLDL 25 0 - 40 mg/dL   LDL Cholesterol 148 (H) 0 - 99 mg/dL    Comment:        Total Cholesterol/HDL:CHD Risk Coronary Heart Disease Risk Table                     Men   Women  1/2 Average Risk   3.4   3.3  Average Risk       5.0   4.4  2 X Average Risk   9.6   7.1  3 X Average Risk  23.4   11.0        Use the calculated Patient Ratio above and the CHD Risk Table to determine the patient's CHD Risk.        ATP III CLASSIFICATION (LDL):  <100     mg/dL   Optimal  100-129  mg/dL   Near or Above                    Optimal  130-159  mg/dL   Borderline  160-189  mg/dL   High  >190     mg/dL   Very High Performed at [facility] [address] [place], [place] [id number]   Basic metabolic panel     Status: Abnormal   Collection Time: 05/13/19  6:00 AM  Result Value Ref Range   Sodium 141 135 - 145 mmol/L   Potassium 3.8 3.5 - 5.1 mmol/L   Chloride 107 98 - 111 mmol/L   CO2 24 22 - 32 mmol/L   Glucose, Bld 124 (H) 70 - 99 mg/dL   BUN 13 8 - 23 mg/dL   Creatinine, Ser 0.78 0.44 - 1.00 mg/dL   Calcium 9.2 8.9 - 10.3 mg/dL   GFR calc non Af Amer >60 >60 mL/min   GFR calc Af Amer >60 >60 mL/min   Anion gap 10 5 - 15    Comment: Performed at [facility], [place] [address], [place], [place] [address]  T4, free     Status: None   Collection Time: 05/13/19  9:21 AM  Result Value Ref Range   Free T4 1.06 0.61 - 1.12 ng/dL    Comment: (NOTE) Biotin ingestion may interfere with free T4 tests. If the results are inconsistent with the TSH level, previous test results, or the clinical presentation, then consider biotin interference. If needed, order repeat testing after stopping biotin.  Performed at [facility], [place] [address] [place], [place] [address]   T3     Status: Abnormal   Collection Time: 05/13/19  9:21 AM  Result Value Ref Range   T3, Total 70 (L) 71 - 180 ng/dL    Comment: (NOTE) Performed At: [facility] [place], [place] [id number] Rush Farmer MD [id number]   Basic metabolic panel     Status: Abnormal   Collection Time: 05/14/19  4:23 AM  Result Value Ref Range   Sodium 142 135 - 145 mmol/L   Potassium 4.0 3.5 - 5.1 mmol/L   Chloride 104 98 - 111 mmol/L   CO2 28 22 - 32 mmol/L   Glucose, Bld 115 (H) 70 - 99 mg/dL    Comment: Glucose reference range applies only to samples taken after fasting for at least 8 hours.   BUN 10 8 - 23 mg/dL   Creatinine, Ser 0.88 0.44 - 1.00 mg/dL   Calcium 9.8 8.9 -  10.3 mg/dL   GFR calc non Af Amer >60 >60 mL/min   GFR calc Af Amer >60 >60 mL/min   Anion gap 10 5 - 15    Comment: Performed at [facility] [address], [place], [place] [address]    ECG   N/A  Telemetry   Atrial flutter in 120's - Personally Reviewed  Radiology    DG CHEST PORT 1 VIEW  Result Date: 05/12/2019 CLINICAL DATA:  Congestive heart failure EXAM: PORTABLE CHEST 1 VIEW COMPARISON:  05/10/2019 FINDINGS: Single frontal view of the chest demonstrates an enlarged cardiac silhouette, stable. No airspace disease, effusion, or pneumothorax. No acute bony abnormalities. IMPRESSION: 1. Stable exam, no acute process. Electronically Signed   By: Randa Ngo M.D.   On: 05/12/2019 19:58    Cardiac Studies   N/A  Assessment   Principal Problem:   Atrial fibrillation with RVR (HCC) Active Problems:   Hypothyroidism   Hyperlipidemia   Essential hypertension   Atrial flutter (HCC)   Acute systolic (congestive) heart failure ([place])   Plan   Ms. Taff had successful DCCV this am. Will convert IV amiodarone to po amiodarone 200 mg BID. D/c home on low dose lasix 20 mg daily. Will need repeat  outpatient echo in 2 weeks prior to follow-up with me scheduled on 3/12. EP will arrange follow-up with Dr. Rayann Heman to discuss possible ablation - Looks like she already has an afib clinic appt on 3/3 with Roderic Palau, NP. Thyroid numbers are borderline - would not adjust meds. Ok to d/c home today.  Time Spent Directly with Patient:  I have spent a total of 25 minutes with the patient reviewing hospital notes, telemetry, EKGs, labs and examining the patient as well as establishing an assessment and plan that was discussed personally with the patient.  > 50% of time was spent in direct patient care.  Length of Stay:  LOS: 2 days   Pixie Casino, MD, [facility], [place] Director of the Advanced Lipid Disorders &  Cardiovascular Risk Reduction Clinic Diplomate of the American Board of Clinical Lipidology Attending Cardiologist  Direct Dial: [phone]  Fax: [phone]  Website:  [email or web address] 05/14/2019, 11:09 AM

## 2019-05-14 NOTE — Anesthesia Procedure Notes (Signed)
Date/Time: 05/14/2019 9:17 AM Performed by: Trinna Post., CRNA Pre-anesthesia Checklist: Patient identified, Emergency Drugs available, Suction available, Patient being monitored and Timeout performed Patient Re-evaluated:Patient Re-evaluated prior to induction Oxygen Delivery Method: Ambu bag Preoxygenation: Pre-oxygenation with 100% oxygen Induction Type: IV induction Placement Confirmation: positive ETCO2

## 2019-05-14 NOTE — Progress Notes (Signed)
pts BP is 167/115 on second check  MD notified  Pt asymptomatic  Will continue to monitor

## 2019-05-14 NOTE — Progress Notes (Signed)
  Pt remains in AF this am on amiodarone. Plan for DCCV this afternoon per Dr. Debara Pickett.   Scheduled for AF clinic 05/21/2019 at 1100 am for new patient visit. Will be candidate for ablation in the future.   EP will see as needed while here. Please call with any questions.   Legrand Como [address] [place], [place]  05/14/2019 8:38 AM

## 2019-05-14 NOTE — Transfer of Care (Signed)
Immediate Anesthesia Transfer of Care Note  Patient: Bridget [name]  Procedure(s) Performed: CARDIOVERSION (N/A )  Patient Location: PACU and Endoscopy Unit  Anesthesia Type:General  Level of Consciousness: drowsy  Airway & Oxygen Therapy: Patient Spontanous Breathing  Post-op Assessment: Report given to RN and Post -op Vital signs reviewed and stable  Post vital signs: Reviewed and stable  Last Vitals:  Vitals Value Taken Time  BP    Temp    Pulse    Resp    SpO2      Last Pain:  Vitals:   05/14/19 0839  TempSrc: Oral  PainSc: 0-No pain         Complications: No apparent anesthesia complications

## 2019-05-19 ENCOUNTER — Institutional Professional Consult (permissible substitution): Payer: Medicare HMO | Admitting: Cardiology

## 2019-05-21 ENCOUNTER — Encounter (HOSPITAL_COMMUNITY): Payer: Self-pay | Admitting: Nurse Practitioner

## 2019-05-21 ENCOUNTER — Ambulatory Visit (HOSPITAL_COMMUNITY)
Admission: RE | Admit: 2019-05-21 | Discharge: 2019-05-21 | Disposition: A | Discharge status: Home / Self Care | Payer: Medicare HMO | Source: Ambulatory Visit | Attending: Nurse Practitioner | Admitting: Nurse Practitioner

## 2019-05-21 ENCOUNTER — Other Ambulatory Visit: Payer: Self-pay

## 2019-05-21 VITALS — BP 104/84 | HR 61 | Ht 66.0 in | Wt 162.0 lb

## 2019-05-21 DIAGNOSIS — I5022 Chronic systolic (congestive) heart failure: Secondary | ICD-10-CM | POA: Diagnosis not present

## 2019-05-21 DIAGNOSIS — Z85828 Personal history of other malignant neoplasm of skin: Secondary | ICD-10-CM | POA: Diagnosis not present

## 2019-05-21 DIAGNOSIS — Z882 Allergy status to sulfonamides status: Secondary | ICD-10-CM | POA: Diagnosis not present

## 2019-05-21 DIAGNOSIS — Z79890 Hormone replacement therapy (postmenopausal): Secondary | ICD-10-CM | POA: Insufficient documentation

## 2019-05-21 DIAGNOSIS — E785 Hyperlipidemia, unspecified: Secondary | ICD-10-CM | POA: Diagnosis not present

## 2019-05-21 DIAGNOSIS — Z8249 Family history of ischemic heart disease and other diseases of the circulatory system: Secondary | ICD-10-CM | POA: Insufficient documentation

## 2019-05-21 DIAGNOSIS — Z888 Allergy status to other drugs, medicaments and biological substances status: Secondary | ICD-10-CM | POA: Diagnosis not present

## 2019-05-21 DIAGNOSIS — Z7901 Long term (current) use of anticoagulants: Secondary | ICD-10-CM | POA: Insufficient documentation

## 2019-05-21 DIAGNOSIS — Z79899 Other long term (current) drug therapy: Secondary | ICD-10-CM | POA: Insufficient documentation

## 2019-05-21 DIAGNOSIS — Z885 Allergy status to narcotic agent status: Secondary | ICD-10-CM | POA: Insufficient documentation

## 2019-05-21 DIAGNOSIS — I4892 Unspecified atrial flutter: Secondary | ICD-10-CM | POA: Diagnosis not present

## 2019-05-21 DIAGNOSIS — K219 Gastro-esophageal reflux disease without esophagitis: Secondary | ICD-10-CM | POA: Insufficient documentation

## 2019-05-21 DIAGNOSIS — D6869 Other thrombophilia: Secondary | ICD-10-CM

## 2019-05-21 DIAGNOSIS — I4819 Other persistent atrial fibrillation: Secondary | ICD-10-CM

## 2019-05-21 DIAGNOSIS — I110 Hypertensive heart disease with heart failure: Secondary | ICD-10-CM | POA: Diagnosis not present

## 2019-05-21 DIAGNOSIS — E039 Hypothyroidism, unspecified: Secondary | ICD-10-CM | POA: Insufficient documentation

## 2019-05-21 LAB — BASIC METABOLIC PANEL
Anion gap: 12 (ref 5–15)
BUN: 24 mg/dL — ABNORMAL HIGH (ref 8–23)
CO2: 22 mmol/L (ref 22–32)
Calcium: 9.3 mg/dL (ref 8.9–10.3)
Chloride: 108 mmol/L (ref 98–111)
Creatinine, Ser: 1.01 mg/dL — ABNORMAL HIGH (ref 0.44–1.00)
GFR calc Af Amer: >60 mL/min (ref >60)
GFR calc non Af Amer: 54 mL/min — ABNORMAL LOW (ref >60)
Glucose, Bld: 138 mg/dL — ABNORMAL HIGH (ref 70–99)
Potassium: 4.3 mmol/L (ref 3.5–5.1)
Sodium: 142 mmol/L (ref 135–145)

## 2019-05-21 NOTE — Progress Notes (Signed)
Primary Care Physician: Bridget Hipps, MD Referring Physician: Dr. Debara [name] EP: Bridget [name] is a 79 y.o. female with a h/o HTN, CHF,   Atrial flutter with 2:1 conduction recently diagnosed (04/30/19). Her new AFL had been treated with coreg and eliquis for CHA2DS2VASC of at least 5. She underwent TEE/DCCV 05/02/19 which was successful, but did show new systolic HF with EF 99991111 range, thought to be tachy-mediated.  She had recurrent AFL and underwent DCCV 2/19, within 24 hours she had reported back to the ED with recurrent DCCV 2/20, which was again only temporarily successful.   She presented to Dr. Lysbeth [name]  office 05/12/2019 with racing HR and SOB with profound fatigue. ECG showed ? AFL vs coarse AF with RVR at rate of 157 bpm.. Admitted for amiodarone load with consideration for recurrent DCCV vs ablation . She did have successful cardioversion. Dr. Rayann [name] saw on consult and recommended afib/flutter ablation in the near future.   She is now in the afib clinic for f/u. She has been  on amiodarone 200 mg bid since 2/22. She is in SR today. Qtc stable. She feels improved. She is not having any issues with her eliquis 5 mg bid. No fluid issues with being d/c on lasix 20 mg qd. Still notices some mild shortness of breath.  Today, she denies symptoms of palpitations, chest pain, shortness of breath, orthopnea, PND, lower extremity edema, dizziness, presyncope, syncope, or neurologic sequela. The patient is tolerating medications without difficulties and is otherwise without complaint today.   Past Medical History:  Diagnosis Date  . Allergy   . Arthritis    hip OA  . Degenerative disc disease   . GERD (gastroesophageal reflux disease)   . Hyperlipidemia   . Hypertension   . Hypothyroidism   . Migraines   . Neuromuscular disorder ([place])   . Neuropathy   . Seasonal allergies   . 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  CARDIOVERSION N/A 05/02/2019   Procedure: CARDIOVERSION;  Surgeon: Pixie Casino, MD;  Location: [facility] ENDOSCOPY;  Service: Cardiovascular;  Laterality: N/A;  . CARDIOVERSION N/A 05/14/2019   Procedure: CARDIOVERSION;  Surgeon: [name], MD;  Location: [facility] ENDOSCOPY;  Service: Cardiovascular;  Laterality: N/A;  . CATARACT EXTRACTION, BILATERAL    . CERVICAL DISCECTOMY  2005   with fusion  . CHOLECYSTECTOMY  2006  . COLONOSCOPY    . LUMBAR LAMINECTOMY/DECOMPRESSION MICRODISCECTOMY Right 11/13/2014   Procedure: Right Lumbar four-five Microdiskectomy;  Surgeon: Leeroy Cha, MD;  Location: [facility] NEURO ORS;  Service: Neurosurgery;  Laterality: Right;  right  . POLYPECTOMY    . 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 TEE WITHOUT CARDIOVERSION N/A 05/02/2019   Procedure: TRANSESOPHAGEAL ECHOCARDIOGRAM (TEE);  Surgeon: Pixie Casino, MD;  Location: [facility];  Service: Cardiovascular;  Laterality: N/A;  . TOTAL ABDOMINAL HYSTERECTOMY  1978  . TOTAL HIP ARTHROPLASTY Left 04/2011  . TRANSTHORACIC ECHOCARDIOGRAM  12/2008   EF=>55%, borderline conc LVH; trace MR; mod TR; trace AV regurg    Current Outpatient Medications  Medication Sig Dispense Refill  . AIMOVIG 140 MG/ML SOAJ Inject 140 mg into the skin every 30 (thirty) days.    Marland Kitchen amiodarone (PACERONE) 200 MG tablet Take 1 tablet (200 mg total) by mouth 2 (two) times daily. 60 tablet 0  . apixaban (ELIQUIS) 5 MG TABS tablet Take 1 tablet (5 mg total) by mouth  2 (two) times daily. 60 tablet 11  . aspirin-acetaminophen-caffeine (EXCEDRIN MIGRAINE) 250-250-65 MG tablet Take 1 tablet by mouth every 8 (eight) hours as needed for migraine.     Marland Kitchen b complex vitamins tablet Take 1 tablet by mouth daily. With vitamin c 500 mg    . carvedilol (COREG) 6.25 MG tablet Take 1 tablet (6.25 mg total) by mouth 2 (two) times daily. 180 tablet 3  . Cholecalciferol (VITAMIN D-1000 MAX ST) 25 MCG (1000 UT) tablet Take 2,000 Units by mouth daily.     Marland Kitchen Dextran  70-Hypromellose (ARTIFICIAL TEARS PF OP) Place 1 drop into both eyes daily as needed (For dry eyes).    . furosemide (LASIX) 20 MG tablet Take 1 tablet (20 mg total) by mouth daily. 90 tablet 1  . gabapentin (NEURONTIN) 300 MG capsule Take 300 mg by mouth at bedtime.     . irbesartan (AVAPRO) 75 MG tablet Take 1 tablet (75 mg total) by mouth daily. 90 tablet 1  . levothyroxine (SYNTHROID, LEVOTHROID) 75 MCG tablet Take 75 mcg by mouth daily before breakfast.    . metroNIDAZOLE (METROCREAM) 0.75 % cream Apply 1 application topically once a week.    . promethazine (PHENERGAN) 25 MG tablet Take 25 mg by mouth every 6 (six) hours as needed for nausea.     . rosuvastatin (CRESTOR) 20 MG tablet Take 1 tablet (20 mg total) by mouth daily at 6 PM. 90 tablet 3  . terbinafine (LAMISIL) 250 MG tablet Take 250 mg by mouth daily.    . traMADol (ULTRAM) 50 MG tablet Take 50-100 mg by mouth every 4 (four) hours as needed for moderate pain. as needed for migraine pain and neuropathy.    . zinc gluconate 50 MG tablet Take 50 mg by mouth daily.     No current facility-administered medications for this encounter.    Allergies  Allergen Reactions  . Codeine Anaphylaxis  . Sulfonamide Derivatives Anaphylaxis  . 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 Amlodipine Other (See Comments)    swelling    Social History   Socioeconomic History  . Marital status: Married    Spouse name: Not on file  . Number of children: 2  . Years of education: Not on file  . Highest education level: Not on file  Occupational History  . Occupation: retired    Comment: medical records admin  Tobacco Use  . Smoking status: Never Smoker  . Smokeless tobacco: Never Used  Substance and Sexual Activity  . Alcohol use: No  . Drug use: No  . Sexual activity: Not on file  Other Topics Concern  . Not on file  Social History Narrative  . Not on file   Social Determinants of Health     Financial Resource Strain:   . Difficulty of Paying Living Expenses: Not on file  Food Insecurity:   . Worried About Charity fundraiser in the Last Year: Not on file  . Ran Out of Food in the Last Year: Not on file  Transportation Needs:   . Lack of Transportation (Medical): Not on file  . Lack of Transportation (Non-Medical): Not on file  Physical Activity:   . Days of Exercise per Week: Not on file  . Minutes of Exercise per Session: Not on file  Stress:   . Feeling of Stress : Not on file  Social Connections:   . Frequency of Communication with Friends  and Family: Not on file  . Frequency of Social Gatherings with Friends and Family: Not on file  . Attends Religious Services: Not on file  . Active Member of Clubs or Organizations: Not on file  . Attends Archivist Meetings: Not on file  . Marital Status: Not on file  Intimate Partner Violence:   . Fear of Current or Ex-Partner: Not on file  . Emotionally Abused: Not on file  . Physically Abused: Not on file  . Sexually Abused: Not on file    Family History  Problem Relation Age of Onset  . Stroke Mother 85  . Coronary artery disease Father 22  . Colon polyps Father   . Colon cancer Cousin   . Esophageal cancer Cousin   . Stroke Maternal Grandfather   . Stroke Paternal Grandmother   . Breast cancer Paternal Grandmother   . Suicidality Daughter   . Rectal cancer Neg Hx   . Stomach cancer Neg Hx     ROS- All systems are reviewed and negative except as per the HPI above  Physical Exam: Vitals:   05/21/19 1055  BP: 104/84  Pulse: 61  Weight: 73.5 kg  Height: 5\' 6"  (1.676 m)   Wt Readings from Last 3 Encounters:  05/21/19 73.5 kg  05/14/19 72.9 kg  05/12/19 74.9 kg    Labs: Lab Results  Component Value Date   NA 142 05/14/2019   K 4.0 05/14/2019   CL 104 05/14/2019   CO2 28 05/14/2019   GLUCOSE 115 (H) 05/14/2019   BUN 10 05/14/2019   CREATININE 0.88 05/14/2019   CALCIUM 9.8 05/14/2019    MG 2.1 05/12/2019   No results found for: INR Lab Results  Component Value Date   CHOL 221 (H) 05/13/2019   HDL 48 05/13/2019   LDLCALC 148 (H) 05/13/2019   TRIG 124 05/13/2019     GEN- The patient is well appearing, alert and oriented x 3 today.   Head- normocephalic, atraumatic Eyes-  Sclera clear, conjunctiva pink Ears- hearing intact Oropharynx- clear Neck- supple, no JVP Lymph- no cervical lymphadenopathy Lungs- Clear to ausculation bilaterally, normal work of breathing Heart- Regular rate and rhythm, no murmurs, rubs or gallops, PMI not laterally displaced GI- soft, NT, ND, + BS Extremities- no clubbing, cyanosis, or edema MS- no significant deformity or atrophy Skin- no rash or lesion Psych- euthymic mood, full affect Neuro- strength and sensation are intact  EKG-NSR at 61 bpm, pr int 168 ms, qrs int 90 ms, qtc 463 ms   TEE-1. Left ventricular ejection fraction, by estimation, is 30 to 35%. The left ventricle has moderately decreased function. The left ventricle demonstrates global hypokinesis. The left ventricular internal cavity size was mildly dilated. Left ventricular diastolic function could not be evaluated. 2. Right ventricular systolic function is normal. The right ventricular size is normal. 3. Left atrial size was mildly dilated. No left atrial/left atrial appendage thrombus was detected. 4. The mitral valve is grossly normal. Mild mitral valve regurgitation. 5. The aortic valve is tricuspid. Aortic valve regurgitation is mild. 6. Cannot exclude small PFO. Conclusion(s)/Recomendation(s): No LA/LAA thrombus identified. Successful cardioversion performed with restoration of normal sinus rhythm.   Assessment and Plan: 1. Persistent afib/flutter This is a recent diagnosis. She underwent TEE/DCCV 2/12 -> DCCV 2/19 and 2/20.  Seen in Dr. Lysbeth [name] office 2/22 and back in AF , sent for  admission,received amiodarone and subsequent successful TEE guided  cardioversion LA size "mildly dilated" Dr. Rayann [name] thought pt  to be an ablation candidate down the road.  She is staying in SR and feels improved Continue the loading dose of amiodarone 200 mg bid with consideration to decrease to 200 mg daily at Dr. Lysbeth [name] visit 05/30/19.  2. CHA2DS2VASc score of 5 Continue eliquis 5 mg bid   3. 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 Rayann [name] in April to further discuss ablation They would prefer in office visit   F/u with Dr. Debara [name] 3/12, echo 3/9 afib clinic as needed   Butch Penny C. [facility] [address] [place],  [address] [phone]  .

## 2019-05-27 ENCOUNTER — Other Ambulatory Visit: Payer: Self-pay

## 2019-05-27 ENCOUNTER — Ambulatory Visit (HOSPITAL_COMMUNITY): Payer: Medicare HMO | Attending: Cardiology

## 2019-05-27 DIAGNOSIS — I5021 Acute systolic (congestive) heart failure: Secondary | ICD-10-CM | POA: Diagnosis present

## 2019-05-30 ENCOUNTER — Encounter: Payer: Self-pay | Admitting: Internal Medicine

## 2019-05-30 ENCOUNTER — Other Ambulatory Visit: Payer: Self-pay

## 2019-05-30 ENCOUNTER — Ambulatory Visit: Payer: Medicare HMO | Admitting: Internal Medicine

## 2019-05-30 VITALS — BP 122/80 | HR 59 | Temp 97.0°F | Ht 66.0 in | Wt 165.0 lb

## 2019-05-30 DIAGNOSIS — I5021 Acute systolic (congestive) heart failure: Secondary | ICD-10-CM | POA: Diagnosis not present

## 2019-05-30 DIAGNOSIS — I10 Essential (primary) hypertension: Secondary | ICD-10-CM | POA: Diagnosis not present

## 2019-05-30 DIAGNOSIS — I4892 Unspecified atrial flutter: Secondary | ICD-10-CM | POA: Diagnosis not present

## 2019-05-30 NOTE — Progress Notes (Signed)
OFFICE NOTE  Chief Complaint:  Follow-up  Primary Care Physician: Ronita Hipps, MD  HPI:  Bridget [name]  is a 79 year old female I have been seeing annually with a history of fatigue in the past. Actually when I saw her the last time she was short of breath only walking across the kitchen. We did undergo a significant workup which was basically normal. She returns today and says those symptoms have completely resolved and is unclear what they were due to. Unfortunately in February as you know she had a left hip replacement and is due for right knee replacement sometime in the near future. Denies any chest pain, worsening shortness of breath, palpitations, presyncope or syncopal symptoms.  Her main complaint is arthritis which is now developing in her left hip and knee. She's been hesitant to take meloxicam due to concerns about worsening heart disease. She reported she had cholesterol testing several months ago and is due for this again. The only other change is that she was noted to be taken off of amlodipine and switched to benazepril. She's not sure that that works as well for her blood pressure, but it does appear well-controlled today 122/82.  This was due to lower extremity swelling.  I saw Bridget [name] back in the office today. She's been having some problems with recurrent headaches. She's currently go to headache clinic and has been weaned off of all NSAIDs. She denies any chest pain or worsening shortness of breath. Her blood pressure control is been excellent. She's not had reassessment of her cholesterol to my knowledge since her last study in 2014.  Bridget [name] returns today for follow-up. She is being seen Dr. Joya Salm for back pain. She is on gabapentin 3 times daily but continues to have neuropathic symptoms. She also takes Percocet for pain. Blood pressure is well-controlled today. She denies any chest pain or worsening shortness of breath. EKG shows normal sinus  rhythm.  04/14/2016  Bridget [name] was seen today in annual follow-up. She has a number of concerns today including recurrent migraine headaches. She says they have improved somewhat with treatment by a neurologist however she continues to have problems with them. She also has labile blood pressures. When asked more questions about this it seems that she tends to adjust her medication doses based on what her blood pressure is. She may take her blood pressure for 5 times a day and if she feels like the blood pressures too low she may take less than the prescribed dose of medication or perhaps take additional medication if the blood pressure is too high. Given the long-term effects of these medicines which are both 16-24 hour half-lives, this strategy is probably causing her to have the high and low blood pressures that she seeing. Today her blood pressure was 98/62 and she is slightly presyncopal with this.  05/01/2017  [name] returns today for follow-up.  Overall she is doing well.  Her blood pressure is excellent today at 122/78.  Since she fell straight of the twice daily carvedilol and the dose was reduced and placed on long-acting carvedilol 20 mg daily.  She remains on irbesartan.  She denies any chest pain or palpitations.  Cholesterol was assessed last summer showed total cholesterol 158, HDL 48, LDL 77 and triglycerides 167.  05/18/2018  Bridget [name] was seen today in annual follow-up.  Overall she is doing well without any new complaints.  She was concerned that there were 2 different beta-blockers on her  med list.  Apparently a long-acting carvedilol as well as a short acting carvedilol.  She says she was having problems with low blood pressure and decreased her carvedilol from 6.25 mg twice daily to once daily.  I believe she was offered the long-acting carvedilol but never really took it.  In addition she had decreased her rosuvastatin to 10 mg daily.  She has not had recent lipids.  Blood pressure  is well controlled today.  04/30/2019  Bridget [name] returns for follow-up which is routine.  She says for the past several weeks to up to 2 months she has had some worsening fatigue, palpitations and shortness of breath.  She spoke with her husband who suggested an earlier appointment however she decided to wait for her routine visit.  She is noted to be tachycardic today and an EKG demonstrated she is in atrial flutter with 2:1 conduction.  This is a new finding for her.  She has no history of A. fib or flutter.  She does have a history of hypothyroidism on levothyroxine.  She reports some fatigue and shortness of breath.  There is some trace ankle edema.  No complaints of chest pain.  05/30/2019  Bridget [name] is seen today in follow-up.  Finally we have achieved and maintained sinus rhythm on amiodarone.  She had a repeat echo on March 9 which showed normalization of LV function trivial mitral regurgitation and mild to moderate aortic regurgitation with a mildly dilated ascending aorta at 4.1 cm.  We will continue to follow this clinically likely with a repeat echo in 1 year.  She does have a follow-up with Dr. Rayann Heman to evaluate possible ablation although this may not be pursued given the fact that she has had normalization of her LV function.  She does report some persistent fatigue.  I wonder if she possibly could be over diuresed on Lasix.  I advised her to discontinue that and use it as needed for weight gain or swelling.  I would also recommend decreasing her amiodarone to 200 mg daily.  PMHx:  Past Medical History:  Diagnosis Date  . Allergy   . Arthritis    hip OA  . Degenerative disc disease   . GERD (gastroesophageal reflux disease)   . Hyperlipidemia   . Hypertension   . Hypothyroidism   . Migraines   . Neuromuscular disorder ([place])   . Neuropathy   . Seasonal allergies   . 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  CARDIOVERSION N/A 05/02/2019   Procedure: CARDIOVERSION;  Surgeon: Pixie Casino, MD;  Location: [facility] ENDOSCOPY;  Service: Cardiovascular;  Laterality: N/A;  . CARDIOVERSION N/A 05/14/2019   Procedure: CARDIOVERSION;  Surgeon: [name], MD;  Location: [facility] ENDOSCOPY;  Service: Cardiovascular;  Laterality: N/A;  . CATARACT EXTRACTION, BILATERAL    . CERVICAL DISCECTOMY  2005   with fusion  . CHOLECYSTECTOMY  2006  . COLONOSCOPY    . LUMBAR LAMINECTOMY/DECOMPRESSION MICRODISCECTOMY Right 11/13/2014   Procedure: Right Lumbar four-five Microdiskectomy;  Surgeon: Leeroy Cha, MD;  Location: [place] NEURO ORS;  Service: Neurosurgery;  Laterality: Right;  right  . POLYPECTOMY    . 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 TEE WITHOUT CARDIOVERSION N/A 05/02/2019   Procedure: TRANSESOPHAGEAL ECHOCARDIOGRAM (TEE);  Surgeon: Pixie Casino, MD;  Location: [place];  Service: Cardiovascular;  Laterality: N/A;  . TOTAL ABDOMINAL HYSTERECTOMY  1978  . TOTAL HIP ARTHROPLASTY  Left 04/2011  . TRANSTHORACIC ECHOCARDIOGRAM  12/2008   EF=>55%, borderline conc LVH; trace MR; mod TR; trace AV regurg    FAMHx:  Family History  Problem Relation Age of Onset  . Stroke Mother 71  . Coronary artery disease Father 18  . Colon polyps Father   . Colon cancer Cousin   . Esophageal cancer Cousin   . Stroke Maternal Grandfather   . Stroke Paternal Grandmother   . Breast cancer Paternal Grandmother   . Suicidality Daughter   . Rectal cancer Neg Hx   . Stomach cancer Neg Hx     SOCHx:   reports that she has never smoked. She has never used smokeless tobacco. She reports that she does not drink alcohol or use drugs.  ALLERGIES:  Allergies  Allergen Reactions  . Codeine Anaphylaxis  . Sulfonamide Derivatives Anaphylaxis  . 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 Amlodipine Other (See Comments)    swelling    ROS: Pertinent items noted in HPI and  remainder of comprehensive ROS otherwise negative.  HOME MEDS: Current Outpatient Medications  Medication Sig Dispense Refill  . AIMOVIG 140 MG/ML SOAJ Inject 140 mg into the skin every 30 (thirty) days.    Marland Kitchen amiodarone (PACERONE) 200 MG tablet Take 1 tablet (200 mg total) by mouth 2 (two) times daily. 60 tablet 0  . apixaban (ELIQUIS) 5 MG TABS tablet Take 1 tablet (5 mg total) by mouth 2 (two) times daily. 60 tablet 11  . aspirin-acetaminophen-caffeine (EXCEDRIN MIGRAINE) 250-250-65 MG tablet Take 1 tablet by mouth every 8 (eight) hours as needed for migraine.     Marland Kitchen b complex vitamins tablet Take 1 tablet by mouth daily. With vitamin c 500 mg    . carvedilol (COREG) 6.25 MG tablet Take 1 tablet (6.25 mg total) by mouth 2 (two) times daily. 180 tablet 3  . Cholecalciferol (VITAMIN D-1000 MAX ST) 25 MCG (1000 UT) tablet Take 2,000 Units by mouth daily.     . clobetasol (TEMOVATE) 0.05 % external solution     . Dextran 70-Hypromellose (ARTIFICIAL TEARS PF OP) Place 1 drop into both eyes daily as needed (For dry eyes).    . furosemide (LASIX) 20 MG tablet Take 1 tablet (20 mg total) by mouth daily. 90 tablet 1  . gabapentin (NEURONTIN) 300 MG capsule Take 300 mg by mouth at bedtime.     . irbesartan (AVAPRO) 75 MG tablet Take 1 tablet (75 mg total) by mouth daily. 90 tablet 1  . levothyroxine (SYNTHROID, LEVOTHROID) 75 MCG tablet Take 75 mcg by mouth daily before breakfast.    . metroNIDAZOLE (METROCREAM) 0.75 % cream Apply 1 application topically once a week.    . promethazine (PHENERGAN) 25 MG tablet Take 25 mg by mouth every 6 (six) hours as needed for nausea.     . rosuvastatin (CRESTOR) 20 MG tablet Take 1 tablet (20 mg total) by mouth daily at 6 PM. 90 tablet 3  . terbinafine (LAMISIL) 250 MG tablet Take 250 mg by mouth daily.    . traMADol (ULTRAM) 50 MG tablet Take 50-100 mg by mouth every 4 (four) hours as needed for moderate pain. as needed for migraine pain and neuropathy.    . zinc  gluconate 50 MG tablet Take 50 mg by mouth daily.     No current facility-administered medications for this visit.    LABS/IMAGING: No results found for this or any previous  visit (from the past 48 hour(s)). No results found.  VITALS: BP 122/80   Pulse (!) 59   Temp (!) 97 F (36.1 C)   Ht 5\' 6"  (1.676 m)   Wt 165 lb (74.8 kg)   LMP  (LMP Unknown)   SpO2 98%   BMI 26.63 kg/m   EXAM: General appearance: alert and no distress Neck: no carotid bruit and no JVD Lungs: clear to auscultation bilaterally Heart: regular rate and rhythm, S1, S2 normal, no murmur, click, rub or gallop and no rub Abdomen: soft, non-tender; bowel sounds normal; no masses,  no organomegaly Extremities: edema none Pulses: 2+ and symmetric Skin: Skin color, texture, turgor normal. No rashes or lesions Neurologic: Grossly normal Psych: Pleasant  EKG: Sinus bradycardia at 59--personally reviewed  ASSESSMENT: 1. New onset atrial flutter with 2-1 AV conduction, s/p DCCV on amiodarone 2. 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 CHADVASC score of 3 4. Hypertension-well controlled 5. Dyslipidemia-on Crestor 6. Arthritis 7. Headaches 8. LBP -neuropathy  PLAN: 1.   Ms. [name] is doing much better although does report some fatigue.  She is maintaining sinus rhythm.  Her LVEF has normalized to 55-60%.  She should remain on Eliquis.  We will plan to decrease her amiodarone from 200 mg twice daily to 200 mg daily.  Also stop Lasix as she may be a little over diuresed to use as needed.  Plan follow-up with Dr. Rayann Heman in April and they could discuss further therapy to either include continuing on amiodarone, switching to [facility] or other alternatives or catheter ablation.  Follow-up with me in 6 months.  Pixie Casino, MD, [facility], [place] Director of the Advanced Lipid Disorders &  Cardiovascular Risk Reduction Clinic Diplomate of the American Board of  Clinical Lipidology Attending Cardiologist  Direct Dial: [phone]  Fax: [phone]  Website:  [email or web address] 05/30/2019, 2:20 PM

## 2019-05-30 NOTE — Patient Instructions (Signed)
Medication Instructions:  DECREASE amiodarone to 200mg  once daily STOP routine lasix - take only as needed Continue other current medications  *If you need a refill on your cardiac medications before your next appointment, please call your pharmacy*  Follow-Up: At [facility], you and your health needs are our priority.  As part of our continuing mission to provide you with exceptional heart care, we have created designated Provider Care Teams.  These Care Teams include your primary Cardiologist (physician) and Advanced Practice Providers (APPs -  Physician Assistants and Nurse Practitioners) who all work together to provide you with the care you need, when you need it.  We recommend signing up for the patient portal called "MyChart".  Sign up information is provided on this After Visit Summary.  MyChart is used to connect with patients for Virtual Visits (Telemedicine).  Patients are able to view lab/test results, encounter notes, upcoming appointments, etc.  Non-urgent messages can be sent to your provider as well.   To learn more about what you can do with MyChart, go to [email or web address].    Your next appointment:   6 month(s)  The format for your next appointment:   In Person  Provider:   You may see Pixie Casino, MD or one of the following Advanced Practice Providers on your designated Care Team:    Almyra Deforest, PA-C  Fabian Sharp, PA-C or   Roby Lofts, [place]    Other Instructions

## 2019-06-10 ENCOUNTER — Other Ambulatory Visit (INDEPENDENT_AMBULATORY_CARE_PROVIDER_SITE_OTHER): Payer: Medicare HMO

## 2019-06-10 DIAGNOSIS — I10 Essential (primary) hypertension: Secondary | ICD-10-CM

## 2019-06-19 ENCOUNTER — Other Ambulatory Visit: Payer: Self-pay

## 2019-06-19 MED ORDER — AMIODARONE HCL 200 MG PO TABS: 200.0000 mg | ORAL_TABLET | Freq: Every day | ORAL | 1 refills | Status: DC

## 2019-07-08 ENCOUNTER — Telehealth: Payer: Self-pay | Admitting: Internal Medicine

## 2019-07-08 NOTE — Telephone Encounter (Signed)
   Pt is calling to request if she can bring her Daughter Bridget [name] to her appt tomorrow 07/09/19. She said she's having trouble remembering information. Will be comfortable if her daughter is with her to assist/help her  Please advise

## 2019-07-08 NOTE — Telephone Encounter (Signed)
Scheduler called to confirm appt date/time and approve daughter to accompany

## 2019-07-10 ENCOUNTER — Other Ambulatory Visit: Payer: Self-pay

## 2019-07-10 ENCOUNTER — Ambulatory Visit: Payer: Medicare HMO | Admitting: Internal Medicine

## 2019-07-10 ENCOUNTER — Encounter: Payer: Self-pay | Admitting: Internal Medicine

## 2019-07-10 VITALS — BP 126/70 | HR 62 | Ht 65.0 in | Wt 168.0 lb

## 2019-07-10 DIAGNOSIS — D6869 Other thrombophilia: Secondary | ICD-10-CM

## 2019-07-10 DIAGNOSIS — I483 Typical atrial flutter: Secondary | ICD-10-CM | POA: Diagnosis not present

## 2019-07-10 DIAGNOSIS — I4819 Other persistent atrial fibrillation: Secondary | ICD-10-CM | POA: Insufficient documentation

## 2019-07-10 NOTE — Progress Notes (Signed)
PCP: Ronita Hipps, MD Primary Cardiologist: Dr Debara Pickett Primary EP: Dr Rayann Heman  Bridget [name] is a 79 y.o. female who presents today for routine electrophysiology followup.  She has rare palpitations due to afib.  She worries about risks of amiodarone.  Today, she denies symptoms of  chest pain, shortness of breath,  dizziness, presyncope, or syncope.  + edema which she attributes to neuropathy.  The patient is otherwise without complaint today.   Past Medical History:  Diagnosis Date  . Allergy   . Arthritis    hip OA  . Degenerative disc disease   . GERD (gastroesophageal reflux disease)   . Hyperlipidemia   . Hypertension   . Hypothyroidism   . Migraines   . Neuromuscular disorder ([place])   . Neuropathy   . Seasonal allergies   . 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 CARDIOVERSION N/A 05/02/2019   Procedure: CARDIOVERSION;  Surgeon: Pixie Casino, MD;  Location: [facility] ENDOSCOPY;  Service: Cardiovascular;  Laterality: N/A;  . CARDIOVERSION N/A 05/14/2019   Procedure: CARDIOVERSION;  Surgeon: [name], MD;  Location[facility] ENDOSCOPY;  Service: Cardiovascular;  Laterality: N/A;  . CATARACT EXTRACTION, BILATERAL    . CERVICAL DISCECTOMY  2005   with fusion  . CHOLECYSTECTOMY  2006  . COLONOSCOPY    . LUMBAR LAMINECTOMY/DECOMPRESSION MICRODISCECTOMY Right 11/13/2014   Procedure: Right Lumbar four-five Microdiskectomy;  Surgeon: Leeroy Cha, MD;  Location: [place] NEURO ORS;  Service: Neurosurgery;  Laterality: Right;  right  . POLYPECTOMY    . 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 TEE WITHOUT CARDIOVERSION N/A 05/02/2019   Procedure: TRANSESOPHAGEAL ECHOCARDIOGRAM (TEE);  Surgeon: Pixie Casino, MD;  Location: [place];  Service: Cardiovascular;  Laterality: N/A;  . TOTAL ABDOMINAL HYSTERECTOMY  1978  . TOTAL HIP ARTHROPLASTY Left 04/2011  . TRANSTHORACIC ECHOCARDIOGRAM  12/2008   EF=>55%, borderline conc  LVH; trace MR; mod TR; trace AV regurg    ROS- all systems are reviewed and negatives except as per HPI above  Current Outpatient Medications  Medication Sig Dispense Refill  . AIMOVIG 140 MG/ML SOAJ Inject 140 mg into the skin every 30 (thirty) days.    Marland Kitchen amiodarone (PACERONE) 200 MG tablet Take 1 tablet (200 mg total) by mouth daily. 90 tablet 1  . apixaban (ELIQUIS) 5 MG TABS tablet Take 1 tablet (5 mg total) by mouth 2 (two) times daily. 60 tablet 11  . aspirin-acetaminophen-caffeine (EXCEDRIN MIGRAINE) 250-250-65 MG tablet Take 1 tablet by mouth every 8 (eight) hours as needed for migraine.     Marland Kitchen b complex vitamins tablet Take 1 tablet by mouth daily. With vitamin c 500 mg    . carvedilol (COREG) 6.25 MG tablet Take 1 tablet (6.25 mg total) by mouth 2 (two) times daily. 180 tablet 3  . Cholecalciferol (VITAMIN D-1000 MAX ST) 25 MCG (1000 UT) tablet Take 2,000 Units by mouth daily.     . clobetasol (TEMOVATE) 0.05 % external solution     . Dextran 70-Hypromellose (ARTIFICIAL TEARS PF OP) Place 1 drop into both eyes daily as needed (For dry eyes).    . furosemide (LASIX) 20 MG tablet Take 20 mg by mouth daily as needed for edema (weight gain).    Marland Kitchen gabapentin (NEURONTIN) 300 MG capsule Take 300 mg by mouth at bedtime.     . irbesartan (AVAPRO) 75 MG tablet Take 1 tablet (75 mg total) by  mouth daily. 90 tablet 1  . levothyroxine (SYNTHROID, LEVOTHROID) 75 MCG tablet Take 75 mcg by mouth daily before breakfast.    . metroNIDAZOLE (METROCREAM) 0.75 % cream Apply 1 application topically once a week.    . promethazine (PHENERGAN) 25 MG tablet Take 25 mg by mouth every 6 (six) hours as needed for nausea.     . rosuvastatin (CRESTOR) 20 MG tablet Take 1 tablet (20 mg total) by mouth daily at 6 PM. 90 tablet 3  . traMADol (ULTRAM) 50 MG tablet Take 50-100 mg by mouth every 4 (four) hours as needed for moderate pain. as needed for migraine pain and neuropathy.    . zinc gluconate 50 MG tablet Take  50 mg by mouth daily.     No current facility-administered medications for this visit.    Physical Exam: Vitals:   07/10/19 1215  BP: 126/70  Pulse: 62  SpO2: 98%  Weight: 168 lb (76.2 kg)  Height: 5\' 5"  (1.651 m)    GEN- The patient is well appearing, alert and oriented x 3 today.   Head- normocephalic, atraumatic Eyes-  Sclera clear, conjunctiva pink Ears- hearing intact Oropharynx- clear Lungs- Clear to ausculation bilaterally, normal work of breathing Heart- Regular rate and rhythm, no murmurs, rubs or gallops, PMI not laterally displaced GI- soft, NT, ND, + BS Extremities- +edema  Wt Readings from Last 3 Encounters:  07/10/19 168 lb (76.2 kg)  05/30/19 165 lb (74.8 kg)  05/21/19 162 lb (73.5 kg)    EKG tracing ordered today is personally reviewed and shows sinus rhythm 62 bpm, PR 146 msec, QRS 94 msec, QTc 454 msec  Echo 05/27/19- EF 55-60%, normal RA/LA size  Assessment and Plan:  1. Persistent afib/ atrial flutter She continues to have arrhythmia despite amiodarone The patient has symptomatic, recurrent atrial fibrillation and atrial flutter. she has ongoing afib despite amiodarone.  She also worries about risks of this medicine.  Today, we discussed the importance of close follow-up on this medicine to avoid toxicity. Chads2vasc score is 5.  she is anticoagulated with eliquis . Therapeutic strategies for afib including medicine and ablation were discussed in detail with the patient today. Risk, benefits, and alternatives to EP study and radiofrequency ablation for afib were also discussed in detail today. At this time, she wishes to discuss further with her family.  If she decides to proceed, she will contact my office. Ultimately, she could continue amiodarone or consider reducing to 100mg  daily.  2. Acute systolic dysfunction Likely tachycardia mediated EF has recovered with sinus Continue medical management  Return in 3 months  Thompson Grayer MD,  [facility] 07/10/2019 12:38 PM

## 2019-07-10 NOTE — Patient Instructions (Addendum)
Medication Instructions:  Your physician recommends that you continue on your current medications as directed. Please refer to the Current Medication list given to you today.  Labwork: None ordered.  Testing/Procedures: None ordered.  Follow-Up: Your physician wants you to follow-up in: 3 months with Dr. Allred.      Any Other Special Instructions Will Be Listed Below (If Applicable).  If you need a refill on your cardiac medications before your next appointment, please call your pharmacy.   

## 2019-10-22 ENCOUNTER — Telehealth: Payer: Medicare HMO | Admitting: Internal Medicine

## 2019-10-27 ENCOUNTER — Other Ambulatory Visit: Payer: Self-pay | Admitting: Internal Medicine

## 2019-11-14 ENCOUNTER — Encounter: Payer: Self-pay | Admitting: Internal Medicine

## 2019-11-14 ENCOUNTER — Ambulatory Visit: Payer: Medicare HMO | Admitting: Internal Medicine

## 2019-11-14 ENCOUNTER — Other Ambulatory Visit: Payer: Self-pay

## 2019-11-14 ENCOUNTER — Encounter: Payer: Self-pay | Admitting: *Deleted

## 2019-11-14 VITALS — BP 100/70 | HR 59 | Ht 65.0 in | Wt 165.0 lb

## 2019-11-14 DIAGNOSIS — I4819 Other persistent atrial fibrillation: Secondary | ICD-10-CM | POA: Diagnosis not present

## 2019-11-14 DIAGNOSIS — I483 Typical atrial flutter: Secondary | ICD-10-CM | POA: Diagnosis not present

## 2019-11-14 DIAGNOSIS — I519 Heart disease, unspecified: Secondary | ICD-10-CM

## 2019-11-14 DIAGNOSIS — D6869 Other thrombophilia: Secondary | ICD-10-CM

## 2019-11-14 DIAGNOSIS — I4891 Unspecified atrial fibrillation: Secondary | ICD-10-CM

## 2019-11-14 MED ORDER — AMIODARONE HCL 200 MG PO TABS: 100.0000 mg | ORAL_TABLET | Freq: Every day | ORAL | 5 refills | Status: DC

## 2019-11-14 NOTE — Patient Instructions (Addendum)
Medication Instructions:  1 reduce your amiodarone to 100 mg    take 1/2 tablet by mouth daily. *If you need a refill on your cardiac medications before your next appointment, please call your pharmacy*  Lab Work: None ordered.  If you have labs (blood work) drawn today and your tests are completely normal, you will receive your results only by: Marland Kitchen MyChart Message (if you have MyChart) OR . A paper copy in the mail If you have any lab test that is abnormal or we need to change your treatment, we will call you to review the results.  Testing/Procedures: Your physician has recommended that you have an ablation. Catheter ablation is a medical procedure used to treat some cardiac arrhythmias (irregular heartbeats). During catheter ablation, a long, thin, flexible tube is put into a blood vessel in your groin (upper thigh), or neck. This tube is called an ablation catheter. It is then guided to your heart through the blood vessel. Radio frequency waves destroy small areas of heart tissue where abnormal heartbeats may cause an arrhythmia to start. Please see the instruction sheet given to you today.   Follow-Up: At [facility], you and your health needs are our priority.  As part of our continuing mission to provide you with exceptional heart care, we have created designated Provider Care Teams.  These Care Teams include your primary Cardiologist (physician) and Advanced Practice Providers (APPs -  Physician Assistants and Nurse Practitioners) who all work together to provide you with the care you need, when you need it.  We recommend signing up for the patient portal called "MyChart".  Sign up information is provided on this After Visit Summary.  MyChart is used to connect with patients for Virtual Visits (Telemedicine).  Patients are able to view lab/test results, encounter notes, upcoming appointments, etc.  Non-urgent messages can be sent to your provider as well.   To learn more about what you  can do with MyChart, go to [email or web address].      Other Instructions:   See letter!

## 2019-11-14 NOTE — Progress Notes (Signed)
PCP: Ronita Hipps, MD Primary Cardiologist: Dr Debara Pickett Primary EP: Dr Rayann Heman  Bridget [name] is a 79 y.o. female who presents today for routine electrophysiology followup.  Since last being seen in our clinic, the patient reports doing reasonably well. She does not feel that she is tolerating amiodarone.  + headaches, nausea, and tremulousness.   Today, she denies symptoms of palpitations, chest pain, shortness of breath,  lower extremity edema, dizziness, presyncope, or syncope.  The patient is otherwise without complaint today.   Past Medical History:  Diagnosis Date  . Allergy   . Arthritis    hip OA  . Degenerative disc disease   . GERD (gastroesophageal reflux disease)   . Hyperlipidemia   . Hypertension   . Hypothyroidism   . Migraines   . Neuromuscular disorder ([place])   . Neuropathy   . Seasonal allergies   . 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 CARDIOVERSION N/A 05/02/2019   Procedure: CARDIOVERSION;  Surgeon: Pixie Casino, MD;  Location: [facility] ENDOSCOPY;  Service: Cardiovascular;  Laterality: N/A;  . CARDIOVERSION N/A 05/14/2019   Procedure: CARDIOVERSION;  Surgeon: [name], MD;  Location[facility] END[facility]COPY;  Service: Cardiovascular;  Laterality: N/A;  . CATARACT EXTRACTION, BILATERAL    . CERVICAL DISCECTOMY  2005   with fusion  . CHOLECYSTECTOMY  2006  . COLONOSCOPY    . LUMBAR LAMINECTOMY/DECOMPRESSION MICRODISCECTOMY Right 11/13/2014   Procedure: Right Lumbar four-five Microdiskectomy;  Surgeon: Leeroy Cha, MD;  Location: [place] NEURO ORS;  Service: Neurosurgery;  Laterality: Right;  right  . POLYPECTOMY    . 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 TEE WITHOUT CARDIOVERSION N/A 05/02/2019   Procedure: TRANSESOPHAGEAL ECHOCARDIOGRAM (TEE);  Surgeon: Pixie Casino, MD;  Location: [place];  Service: Cardiovascular;  Laterality: N/A;  . TOTAL ABDOMINAL HYSTERECTOMY  1978  . TOTAL HIP  ARTHROPLASTY Left 04/2011  . TRANSTHORACIC ECHOCARDIOGRAM  12/2008   EF=>55%, borderline conc LVH; trace MR; mod TR; trace AV regurg    ROS- all systems are reviewed and negatives except as per HPI above  Current Outpatient Medications  Medication Sig Dispense Refill  . AIMOVIG 140 MG/ML SOAJ Inject 140 mg into the skin every 30 (thirty) days.    Marland Kitchen amiodarone (PACERONE) 200 MG tablet TAKE 1 TABLET BY MOUTH EVERY DAY 90 tablet 1  . apixaban (ELIQUIS) 5 MG TABS tablet Take 1 tablet (5 mg total) by mouth 2 (two) times daily. 60 tablet 11  . b complex vitamins tablet Take 1 tablet by mouth daily. With vitamin c 500 mg    . Cholecalciferol (VITAMIN D-1000 MAX ST) 25 MCG (1000 UT) tablet Take 2,000 Units by mouth daily.     Marland Kitchen Dextran 70-Hypromellose (ARTIFICIAL TEARS PF OP) Place 1 drop into both eyes daily as needed (For dry eyes).    . furosemide (LASIX) 20 MG tablet Take 20 mg by mouth daily as needed for edema (weight gain).    Marland Kitchen gabapentin (NEURONTIN) 300 MG capsule Take 300 mg by mouth at bedtime.     . irbesartan (AVAPRO) 75 MG tablet Take 1 tablet (75 mg total) by mouth daily. 90 tablet 1  . levothyroxine (SYNTHROID, LEVOTHROID) 75 MCG tablet Take 75 mcg by mouth daily before breakfast.    . metroNIDAZOLE (METROCREAM) 0.75 % cream Apply 1 application topically once a week.    . promethazine (PHENERGAN) 25 MG tablet Take 25 mg  by mouth every 6 (six) hours as needed for nausea.     . rosuvastatin (CRESTOR) 20 MG tablet Take 1 tablet (20 mg total) by mouth daily at 6 PM. 90 tablet 3  . traMADol (ULTRAM) 50 MG tablet Take 50-100 mg by mouth every 4 (four) hours as needed for moderate pain. as needed for migraine pain and neuropathy.    . zinc gluconate 50 MG tablet Take 50 mg by mouth daily.    . baclofen (LIORESAL) 10 MG tablet Take 0.5 tablets by mouth as needed.    . carvedilol (COREG) 3.125 MG tablet Take 3.125 mg by mouth 2 (two) times daily.     No current facility-administered  medications for this visit.    Physical Exam: Vitals:   11/14/19 1547  BP: 100/70  Pulse: (!) 59  SpO2: 95%  Weight: 165 lb (74.8 kg)  Height: 5\' 5"  (1.651 m)    GEN- The patient is well appearing, alert and oriented x 3 today.   Head- normocephalic, atraumatic Eyes-  Sclera clear, conjunctiva pink Ears- hearing intact Oropharynx- clear Lungs- Clear to ausculation bilaterally, normal work of breathing Heart- Regular rate and rhythm, no murmurs, rubs or gallops, PMI not laterally displaced GI- soft, NT, ND, + BS Extremities- no clubbing, cyanosis, or edema  Wt Readings from Last 3 Encounters:  11/14/19 165 lb (74.8 kg)  07/10/19 168 lb (76.2 kg)  05/30/19 165 lb (74.8 kg)    EKG tracing ordered today is personally reviewed and shows sinus  Assessment and Plan:  1. Persistent atrial fibrillation/ atrial flutter The patient has symptomatic, recurrent persistent atrial fibrillation. she has failed medical therapy with amiodarone due to side effects. Chads2vasc score is 5.  she is anticoagulated with eliquis . Therapeutic strategies for afib including medicine and ablation were discussed in detail with the patient today. Risk, benefits, and alternatives to EP study and radiofrequency ablation for afib were also discussed in detail today. These risks include but are not limited to stroke, bleeding, vascular damage, tamponade, perforation, damage to the esophagus, lungs, and other structures, pulmonary vein stenosis, worsening renal function, and death. The patient understands these risk and wishes to proceed.  We will therefore proceed with catheter ablation at the next available time.  Carto, ICE, anesthesia are requested for the procedure.  Will also obtain cardiac CT prior to the procedure to exclude LAA thrombus and further evaluate atrial anatomy.  2. Chronic systolic dysfunction EF has recovered with sinus (echo 05/27/19 again reviewed today) Likely tachycardia  mediated Continue medical therapy  Risks, benefits and potential toxicities for medications prescribed and/or refilled reviewed with patient today.   Thompson Grayer MD, [facility] 11/14/2019 4:20 PM

## 2019-11-25 ENCOUNTER — Other Ambulatory Visit: Payer: Medicare HMO | Admitting: *Deleted

## 2019-11-25 ENCOUNTER — Other Ambulatory Visit: Payer: Self-pay

## 2019-11-25 DIAGNOSIS — I4819 Other persistent atrial fibrillation: Secondary | ICD-10-CM

## 2019-11-25 DIAGNOSIS — I483 Typical atrial flutter: Secondary | ICD-10-CM

## 2019-11-25 DIAGNOSIS — D6869 Other thrombophilia: Secondary | ICD-10-CM

## 2019-11-25 DIAGNOSIS — I519 Heart disease, unspecified: Secondary | ICD-10-CM

## 2019-11-26 LAB — CBC WITH DIFFERENTIAL/PLATELET
Basophils Absolute: 0.1 10*3/uL (ref 0.0–0.2)
Basos: 1 %
EOS (ABSOLUTE): 0.3 10*3/uL (ref 0.0–0.4)
Eos: 3 %
Hematocrit: 43.5 % (ref 34.0–46.6)
Hemoglobin: 13.8 g/dL (ref 11.1–15.9)
Immature Grans (Abs): 0 10*3/uL (ref 0.0–0.1)
Immature Granulocytes: 0 %
Lymphocytes Absolute: 1.3 10*3/uL (ref 0.7–3.1)
Lymphs: 16 %
MCH: 28.6 pg (ref 26.6–33.0)
MCHC: 31.7 g/dL (ref 31.5–35.7)
MCV: 90 fL (ref 79–97)
Monocytes Absolute: 0.7 10*3/uL (ref 0.1–0.9)
Monocytes: 8 %
Neutrophils Absolute: 6.2 10*3/uL (ref 1.4–7.0)
Neutrophils: 72 %
Platelets: 177 10*3/uL (ref 150–450)
RBC: 4.82 x10E6/uL (ref 3.77–5.28)
RDW: 14.1 % (ref 11.7–15.4)
WBC: 8.6 10*3/uL (ref 3.4–10.8)

## 2019-11-26 LAB — BASIC METABOLIC PANEL
BUN/Creatinine Ratio: 17 (ref 12–28)
BUN: 20 mg/dL (ref 8–27)
CO2: 25 mmol/L (ref 20–29)
Calcium: 9.7 mg/dL (ref 8.7–10.3)
Chloride: 108 mmol/L — ABNORMAL HIGH (ref 96–106)
Creatinine, Ser: 1.17 mg/dL — ABNORMAL HIGH (ref 0.57–1.00)
GFR calc Af Amer: 52 mL/min/{1.73_m2} — ABNORMAL LOW (ref >59)
GFR calc non Af Amer: 45 mL/min/{1.73_m2} — ABNORMAL LOW (ref >59)
Glucose: 127 mg/dL — ABNORMAL HIGH (ref 65–99)
Potassium: 3.9 mmol/L (ref 3.5–5.2)
Sodium: 146 mmol/L — ABNORMAL HIGH (ref 134–144)

## 2019-12-04 ENCOUNTER — Ambulatory Visit: Payer: Medicare HMO | Admitting: Internal Medicine

## 2019-12-04 ENCOUNTER — Encounter: Payer: Self-pay | Admitting: Internal Medicine

## 2019-12-04 ENCOUNTER — Other Ambulatory Visit: Payer: Self-pay

## 2019-12-04 VITALS — BP 102/60 | HR 60 | Ht 66.0 in | Wt 165.6 lb

## 2019-12-04 DIAGNOSIS — I10 Essential (primary) hypertension: Secondary | ICD-10-CM | POA: Diagnosis not present

## 2019-12-04 DIAGNOSIS — I483 Typical atrial flutter: Secondary | ICD-10-CM

## 2019-12-04 DIAGNOSIS — I519 Heart disease, unspecified: Secondary | ICD-10-CM | POA: Diagnosis not present

## 2019-12-04 NOTE — Progress Notes (Signed)
OFFICE NOTE  Chief Complaint:  Follow-up  Primary Care Physician: Ronita Hipps, MD  HPI:  Bridget [name]  is a 79 year old female I have been seeing annually with a history of fatigue in the past. Actually when I saw her the last time she was short of breath only walking across the kitchen. We did undergo a significant workup which was basically normal. She returns today and says those symptoms have completely resolved and is unclear what they were due to. Unfortunately in February as you know she had a left hip replacement and is due for right knee replacement sometime in the near future. Denies any chest pain, worsening shortness of breath, palpitations, presyncope or syncopal symptoms.  Her main complaint is arthritis which is now developing in her left hip and knee. She's been hesitant to take meloxicam due to concerns about worsening heart disease. She reported she had cholesterol testing several months ago and is due for this again. The only other change is that she was noted to be taken off of amlodipine and switched to benazepril. She's not sure that that works as well for her blood pressure, but it does appear well-controlled today 122/82.  This was due to lower extremity swelling.  I saw Ms. Belknap back in the office today. She's been having some problems with recurrent headaches. She's currently go to headache clinic and has been weaned off of all NSAIDs. She denies any chest pain or worsening shortness of breath. Her blood pressure control is been excellent. She's not had reassessment of her cholesterol to my knowledge since her last study in 2014.  Mrs. Yott returns today for follow-up. She is being seen Dr. Joya Salm for back pain. She is on gabapentin 3 times daily but continues to have neuropathic symptoms. She also takes Percocet for pain. Blood pressure is well-controlled today. She denies any chest pain or worsening shortness of breath. EKG shows normal sinus  rhythm.  04/14/2016  Mrs. Amiri was seen today in annual follow-up. She has a number of concerns today including recurrent migraine headaches. She says they have improved somewhat with treatment by a neurologist however she continues to have problems with them. She also has labile blood pressures. When asked more questions about this it seems that she tends to adjust her medication doses based on what her blood pressure is. She may take her blood pressure for 5 times a day and if she feels like the blood pressures too low she may take less than the prescribed dose of medication or perhaps take additional medication if the blood pressure is too high. Given the long-term effects of these medicines which are both 16-24 hour half-lives, this strategy is probably causing her to have the high and low blood pressures that she seeing. Today her blood pressure was 98/62 and she is slightly presyncopal with this.  05/01/2017  [name] returns today for follow-up.  Overall she is doing well.  Her blood pressure is excellent today at 122/78.  Since she fell straight of the twice daily carvedilol and the dose was reduced and placed on long-acting carvedilol 20 mg daily.  She remains on irbesartan.  She denies any chest pain or palpitations.  Cholesterol was assessed last summer showed total cholesterol 158, HDL 48, LDL 77 and triglycerides 167.  05/18/2018  Mrs. Wysocki was seen today in annual follow-up.  Overall she is doing well without any new complaints.  She was concerned that there were 2 different beta-blockers on her  med list.  Apparently a long-acting carvedilol as well as a short acting carvedilol.  She says she was having problems with low blood pressure and decreased her carvedilol from 6.25 mg twice daily to once daily.  I believe she was offered the long-acting carvedilol but never really took it.  In addition she had decreased her rosuvastatin to 10 mg daily.  She has not had recent lipids.  Blood pressure  is well controlled today.  04/30/2019  Mrs. Velador returns for follow-up which is routine.  She says for the past several weeks to up to 2 months she has had some worsening fatigue, palpitations and shortness of breath.  She spoke with her husband who suggested an earlier appointment however she decided to wait for her routine visit.  She is noted to be tachycardic today and an EKG demonstrated she is in atrial flutter with 2:1 conduction.  This is a new finding for her.  She has no history of A. fib or flutter.  She does have a history of hypothyroidism on levothyroxine.  She reports some fatigue and shortness of breath.  There is some trace ankle edema.  No complaints of chest pain.  05/30/2019  Mrs. Standard is seen today in follow-up.  Finally we have achieved and maintained sinus rhythm on amiodarone.  She had a repeat echo on March 9 which showed normalization of LV function trivial mitral regurgitation and mild to moderate aortic regurgitation with a mildly dilated ascending aorta at 4.1 cm.  We will continue to follow this clinically likely with a repeat echo in 1 year.  She does have a follow-up with Dr. Rayann Heman to evaluate possible ablation although this may not be pursued given the fact that she has had normalization of her LV function.  She does report some persistent fatigue.  I wonder if she possibly could be over diuresed on Lasix.  I advised her to discontinue that and use it as needed for weight gain or swelling.  I would also recommend decreasing her amiodarone to 200 mg daily.  12/04/2019  Diane returns today for follow-up.  She seems to be maintaining sinus rhythm on amiodarone.  Recently she saw Dr. Rayann Heman who feels that she may be a candidate for ablation.  He is scheduled her for A. fib ablation on September 28.  She had many questions today including about how long she may need to take amiodarone.  I wonder if she may actually stay on this after the procedure due to the risk of  ERAF.  PMHx:  Past Medical History:  Diagnosis Date  . Allergy   . Arthritis    hip OA  . Degenerative disc disease   . GERD (gastroesophageal reflux disease)   . Hyperlipidemia   . Hypertension   . Hypothyroidism   . Migraines   . Neuromuscular disorder ([place])   . Neuropathy   . Seasonal allergies   . 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 CARDIOVERSION N/A 05/02/2019   Procedure: CARDIOVERSION;  Surgeon: Pixie Casino, MD;  Location[facility] ENDOSCOPY;  Service: Cardiovascular;  Laterality: N/A;  . CARDIOVERSION N/A 05/14/2019   Procedure: CARDIOVERSION;  Surgeon: [name], MD;  Location: [facility] ENDOSCOPY;  Service: Cardiovascular;  Laterality: N/A;  . CATARACT EXTRACTION, BILATERAL    . CERVICAL DISCECTOMY  2005   with fusion  . CHOLECYSTECTOMY  2006  . COLONOSCOPY    . LUMBAR LAMINECTOMY/DECOMPRESSION  MICRODISCECTOMY Right 11/13/2014   Procedure: Right Lumbar four-five Microdiskectomy;  Surgeon: Leeroy Cha, MD;  Location: [place] NEURO ORS;  Service: Neurosurgery;  Laterality: Right;  right  . POLYPECTOMY    . 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 TEE WITHOUT CARDIOVERSION N/A 05/02/2019   Procedure: TRANSESOPHAGEAL ECHOCARDIOGRAM (TEE);  Surgeon: Pixie Casino, MD;  Location: [place];  Service: Cardiovascular;  Laterality: N/A;  . TOTAL ABDOMINAL HYSTERECTOMY  1978  . TOTAL HIP ARTHROPLASTY Left 04/2011  . TRANSTHORACIC ECHOCARDIOGRAM  12/2008   EF=>55%, borderline conc LVH; trace MR; mod TR; trace AV regurg    FAMHx:  Family History  Problem Relation Age of Onset  . Stroke Mother 28  . Coronary artery disease Father 63  . Colon polyps Father   . Colon cancer Cousin   . Esophageal cancer Cousin   . Stroke Maternal Grandfather   . Stroke Paternal Grandmother   . Breast cancer Paternal Grandmother   . Suicidality Daughter   . Rectal cancer Neg Hx   . Stomach cancer Neg Hx     SOCHx:    reports that she has never smoked. She has never used smokeless tobacco. She reports that she does not drink alcohol and does not use drugs.  ALLERGIES:  Allergies  Allergen Reactions  . Codeine Anaphylaxis  . Sulfonamide Derivatives Anaphylaxis  . 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 Amlodipine Other (See Comments)    swelling    ROS: Pertinent items noted in HPI and remainder of comprehensive ROS otherwise negative.  HOME MEDS: Current Outpatient Medications  Medication Sig Dispense Refill  . AIMOVIG 140 MG/ML SOAJ Inject 140 mg into the skin every 30 (thirty) days.    Marland Kitchen amiodarone (PACERONE) 200 MG tablet Take 0.5 tablets (100 mg total) by mouth daily. 30 tablet 5  . apixaban (ELIQUIS) 5 MG TABS tablet Take 1 tablet (5 mg total) by mouth 2 (two) times daily. 60 tablet 11  . b complex vitamins tablet Take 1 tablet by mouth daily. With vitamin c 500 mg    . baclofen (LIORESAL) 10 MG tablet Take 0.5 tablets by mouth as needed.    . carvedilol (COREG) 3.125 MG tablet Take 3.125 mg by mouth 2 (two) times daily.    . Cholecalciferol (VITAMIN D-1000 MAX ST) 25 MCG (1000 UT) tablet Take 2,000 Units by mouth daily.     Marland Kitchen Dextran 70-Hypromellose (ARTIFICIAL TEARS PF OP) Place 1 drop into both eyes daily as needed (For dry eyes).    . furosemide (LASIX) 20 MG tablet Take 20 mg by mouth daily as needed for edema (weight gain).    Marland Kitchen gabapentin (NEURONTIN) 300 MG capsule Take 300 mg by mouth at bedtime.     . irbesartan (AVAPRO) 75 MG tablet Take 1 tablet (75 mg total) by mouth daily. 90 tablet 1  . levothyroxine (SYNTHROID, LEVOTHROID) 75 MCG tablet Take 75 mcg by mouth daily before breakfast.    . metroNIDAZOLE (METROCREAM) 0.75 % cream Apply 1 application topically once a week.    . promethazine (PHENERGAN) 25 MG tablet Take 25 mg by mouth every 6 (six) hours as needed for nausea.     . rosuvastatin (CRESTOR) 20 MG tablet Take 1 tablet (20 mg  total) by mouth daily at 6 PM. 90 tablet 3  . traMADol (ULTRAM) 50 MG tablet Take 50-100 mg by mouth every 4 (four) hours as needed for moderate pain. as  needed for migraine pain and neuropathy.    . zinc gluconate 50 MG tablet Take 50 mg by mouth daily.     No current facility-administered medications for this visit.    LABS/IMAGING: No results found for this or any previous visit (from the past 48 hour(s)). No results found.  VITALS: BP 102/60   Pulse 60   Ht 5\' 6"  (1.676 m)   Wt 165 lb 9.6 oz (75.1 kg)   LMP  (LMP Unknown)   SpO2 100%   BMI 26.73 kg/m   EXAM: General appearance: alert and no distress Neck: no carotid bruit and no JVD Lungs: clear to auscultation bilaterally Heart: regular rate and rhythm, S1, S2 normal, no murmur, click, rub or gallop and no rub Abdomen: soft, non-tender; bowel sounds normal; no masses,  no organomegaly Extremities: edema none Pulses: 2+ and symmetric Skin: Skin color, texture, turgor normal. No rashes or lesions Neurologic: Grossly normal Psych: Pleasant  EKG: Normal sinus rhythm at 60, left anterior fascicular block-personally reviewed  ASSESSMENT: 1. Paroxysmal atrial flutter with 2-1 AV conduction, s/p DCCV on amiodarone 2. 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 CHADVASC score of 3 4. Hypertension-well controlled 5. Dyslipidemia-on Crestor 6. Arthritis 7. Headaches 8. LBP -neuropathy  PLAN: 1.   Ms. Pettis seems to be maintaining sinus rhythm on amiodarone however is scheduled to have an upcoming ablation with Dr. Rayann Heman.  For now continue amiodarone.  She is anticoagulated.  No signs or symptoms of congestive heart failure.  Follow-up with me in 6 months.  Pixie Casino, MD, [facility] [place], [place] Director of the Advanced Lipid Disorders &  Cardiovascular Risk Reduction Clinic Diplomate of the American Board of Clinical Lipidology Attending Cardiologist  Direct Dial:  [phone]  Fax: [phone]  Website:  [email or web address] 12/04/2019, 2:04 PM

## 2019-12-04 NOTE — Patient Instructions (Signed)

## 2019-12-05 ENCOUNTER — Telehealth (HOSPITAL_COMMUNITY): Payer: Self-pay | Admitting: Emergency Medicine

## 2019-12-05 NOTE — Telephone Encounter (Signed)
Reaching out to patient to offer assistance regarding upcoming cardiac imaging study; pt verbalizes understanding of appt date/time, parking situation and where to check in, pre-test NPO status and medications ordered, and verified current allergies; name and call back number provided for further questions should they arise Marchia Bond RN Navigator Cardiac Imaging [facility] [phone] office [phone] cell   Informed patient that she has been scheduled for IV hydration at 12 noon for her PV CTA w/ contrast to protect her kidney function. Pt verbalized understanding Clarise Cruz

## 2019-12-05 NOTE — Telephone Encounter (Signed)
Attempted to call patient regarding upcoming cardiac CT appointment. °Left message on voicemail with name and callback number °Kae Lauman RN Navigator Cardiac Imaging °[facility] °[phone] Office °[phone] Cell ° °

## 2019-12-09 ENCOUNTER — Other Ambulatory Visit: Payer: Self-pay

## 2019-12-09 ENCOUNTER — Ambulatory Visit (HOSPITAL_COMMUNITY)
Admission: RE | Admit: 2019-12-09 | Discharge: 2019-12-09 | Disposition: A | Discharge status: Home / Self Care | Payer: Medicare HMO | Source: Ambulatory Visit | Attending: Internal Medicine | Admitting: Internal Medicine

## 2019-12-09 DIAGNOSIS — I4891 Unspecified atrial fibrillation: Secondary | ICD-10-CM | POA: Diagnosis present

## 2019-12-09 LAB — BASIC METABOLIC PANEL
Anion gap: 10 (ref 5–15)
BUN: 18 mg/dL (ref 8–23)
CO2: 28 mmol/L (ref 22–32)
Calcium: 9.6 mg/dL (ref 8.9–10.3)
Chloride: 104 mmol/L (ref 98–111)
Creatinine, Ser: 1.3 mg/dL — ABNORMAL HIGH (ref 0.44–1.00)
GFR calc Af Amer: 46 mL/min — ABNORMAL LOW (ref >60)
GFR calc non Af Amer: 39 mL/min — ABNORMAL LOW (ref >60)
Glucose, Bld: 83 mg/dL (ref 70–99)
Potassium: 4 mmol/L (ref 3.5–5.1)
Sodium: 142 mmol/L (ref 135–145)

## 2019-12-09 MED ORDER — SODIUM CHLORIDE 0.9 % WEIGHT BASED INFUSION: 1.0000 mL/kg/h | INTRAVENOUS | Status: DC

## 2019-12-09 MED ORDER — SODIUM CHLORIDE 0.9 % WEIGHT BASED INFUSION
3.0000 mL/kg/h | INTRAVENOUS | Status: DC
  Administered 2019-12-09: 3 mL/kg/h via INTRAVENOUS

## 2019-12-09 MED ORDER — IOHEXOL 300 MG/ML  SOLN
100.0000 mL | Freq: Once | INTRAMUSCULAR | Status: AC | PRN
  Administered 2019-12-09: 100 mL via INTRAVENOUS

## 2019-12-13 ENCOUNTER — Other Ambulatory Visit (HOSPITAL_COMMUNITY)
Admission: RE | Admit: 2019-12-13 | Discharge: 2019-12-13 | Disposition: A | Discharge status: Home / Self Care | Payer: Medicare HMO | Source: Ambulatory Visit | Attending: Internal Medicine | Admitting: Internal Medicine

## 2019-12-13 DIAGNOSIS — Z20822 Contact with and (suspected) exposure to covid-19: Secondary | ICD-10-CM | POA: Diagnosis not present

## 2019-12-13 DIAGNOSIS — Z01812 Encounter for preprocedural laboratory examination: Secondary | ICD-10-CM | POA: Diagnosis present

## 2019-12-13 LAB — SARS CORONAVIRUS 2 (TAT 6-24 HRS): SARS Coronavirus 2: NEGATIVE

## 2019-12-15 NOTE — Progress Notes (Signed)
Instructed patient on the following items: Arrival time 1030 Nothing to eat or drink after midnight No meds AM of procedure Responsible person to drive you home and stay with you for 24 hrs  Have you missed any doses of anti-coagulant Eliquis- no doses missed

## 2019-12-16 ENCOUNTER — Encounter (HOSPITAL_COMMUNITY)
Admission: RE | Disposition: A | Discharge status: Home / Self Care | Payer: Self-pay | Source: Home / Self Care | Attending: Internal Medicine

## 2019-12-16 ENCOUNTER — Ambulatory Visit (HOSPITAL_COMMUNITY)
Admission: RE | Admit: 2019-12-16 | Discharge: 2019-12-16 | Disposition: A | Discharge status: Home / Self Care | Payer: Medicare HMO | Attending: Internal Medicine | Admitting: Internal Medicine

## 2019-12-16 ENCOUNTER — Ambulatory Visit (HOSPITAL_COMMUNITY): Payer: Medicare HMO | Admitting: Anesthesiology

## 2019-12-16 ENCOUNTER — Encounter (HOSPITAL_COMMUNITY): Payer: Self-pay | Admitting: Internal Medicine

## 2019-12-16 ENCOUNTER — Other Ambulatory Visit: Payer: Self-pay

## 2019-12-16 DIAGNOSIS — M169 Osteoarthritis of hip, unspecified: Secondary | ICD-10-CM | POA: Diagnosis not present

## 2019-12-16 DIAGNOSIS — K219 Gastro-esophageal reflux disease without esophagitis: Secondary | ICD-10-CM | POA: Insufficient documentation

## 2019-12-16 DIAGNOSIS — I10 Essential (primary) hypertension: Secondary | ICD-10-CM | POA: Insufficient documentation

## 2019-12-16 DIAGNOSIS — G43909 Migraine, unspecified, not intractable, without status migrainosus: Secondary | ICD-10-CM | POA: Diagnosis not present

## 2019-12-16 DIAGNOSIS — G629 Polyneuropathy, unspecified: Secondary | ICD-10-CM | POA: Diagnosis not present

## 2019-12-16 DIAGNOSIS — Z79899 Other long term (current) drug therapy: Secondary | ICD-10-CM | POA: Insufficient documentation

## 2019-12-16 DIAGNOSIS — I4819 Other persistent atrial fibrillation: Secondary | ICD-10-CM | POA: Insufficient documentation

## 2019-12-16 DIAGNOSIS — E785 Hyperlipidemia, unspecified: Secondary | ICD-10-CM | POA: Insufficient documentation

## 2019-12-16 DIAGNOSIS — Z9049 Acquired absence of other specified parts of digestive tract: Secondary | ICD-10-CM | POA: Diagnosis not present

## 2019-12-16 DIAGNOSIS — I4892 Unspecified atrial flutter: Secondary | ICD-10-CM | POA: Insufficient documentation

## 2019-12-16 DIAGNOSIS — E039 Hypothyroidism, unspecified: Secondary | ICD-10-CM | POA: Diagnosis not present

## 2019-12-16 DIAGNOSIS — Z90710 Acquired absence of both cervix and uterus: Secondary | ICD-10-CM | POA: Diagnosis not present

## 2019-12-16 DIAGNOSIS — Z96651 Presence of right artificial knee joint: Secondary | ICD-10-CM | POA: Insufficient documentation

## 2019-12-16 DIAGNOSIS — Z85828 Personal history of other malignant neoplasm of skin: Secondary | ICD-10-CM | POA: Insufficient documentation

## 2019-12-16 DIAGNOSIS — I471 Supraventricular tachycardia: Secondary | ICD-10-CM | POA: Insufficient documentation

## 2019-12-16 DIAGNOSIS — I483 Typical atrial flutter: Secondary | ICD-10-CM

## 2019-12-16 DIAGNOSIS — Z79890 Hormone replacement therapy (postmenopausal): Secondary | ICD-10-CM | POA: Insufficient documentation

## 2019-12-16 DIAGNOSIS — Z96642 Presence of left artificial hip joint: Secondary | ICD-10-CM | POA: Insufficient documentation

## 2019-12-16 DIAGNOSIS — Z7901 Long term (current) use of anticoagulants: Secondary | ICD-10-CM | POA: Diagnosis not present

## 2019-12-16 HISTORY — PX: ATRIAL FIBRILLATION ABLATION: EP1191

## 2019-12-16 SURGERY — ATRIAL FIBRILLATION ABLATION
Anesthesia: General

## 2019-12-16 MED ORDER — SODIUM CHLORIDE 0.9% FLUSH: 3.0000 mL | INTRAVENOUS | Status: DC | PRN

## 2019-12-16 MED ORDER — APIXABAN 5 MG PO TABS
5.0000 mg | ORAL_TABLET | Freq: Once | ORAL | Status: DC
Start: 2019-12-16 — End: 2019-12-17

## 2019-12-16 MED ORDER — SUGAMMADEX SODIUM 200 MG/2ML IV SOLN
INTRAVENOUS | Status: DC | PRN
  Administered 2019-12-16: 200 mg via INTRAVENOUS

## 2019-12-16 MED ORDER — HEPARIN SODIUM (PORCINE) 1000 UNIT/ML IJ SOLN
INTRAMUSCULAR | Status: DC | PRN
  Administered 2019-12-16: 1000 [IU] via INTRAVENOUS
  Administered 2019-12-16: 14000 [IU] via INTRAVENOUS

## 2019-12-16 MED ORDER — TRAMADOL HCL 50 MG PO TABS
50.0000 mg | ORAL_TABLET | Freq: Once | ORAL | Status: AC
  Administered 2019-12-16: 50 mg via ORAL

## 2019-12-16 MED ORDER — SODIUM CHLORIDE 0.9 % IV SOLN: 250.0000 mL | INTRAVENOUS | Status: DC | PRN

## 2019-12-16 MED ORDER — HEPARIN (PORCINE) IN NACL 1000-0.9 UT/500ML-% IV SOLN
INTRAVENOUS | Status: AC
  Filled 2019-12-16: qty 500

## 2019-12-16 MED ORDER — DEXAMETHASONE SODIUM PHOSPHATE 10 MG/ML IJ SOLN
INTRAMUSCULAR | Status: DC | PRN
  Administered 2019-12-16: 5 mg via INTRAVENOUS

## 2019-12-16 MED ORDER — ROCURONIUM BROMIDE 10 MG/ML (PF) SYRINGE
PREFILLED_SYRINGE | INTRAVENOUS | Status: DC | PRN
  Administered 2019-12-16: 20 mg via INTRAVENOUS
  Administered 2019-12-16: 50 mg via INTRAVENOUS
  Administered 2019-12-16: 20 mg via INTRAVENOUS

## 2019-12-16 MED ORDER — ACETAMINOPHEN 325 MG PO TABS: 650.0000 mg | ORAL_TABLET | ORAL | Status: DC | PRN

## 2019-12-16 MED ORDER — HEPARIN SODIUM (PORCINE) 1000 UNIT/ML IJ SOLN
INTRAMUSCULAR | Status: AC
  Filled 2019-12-16: qty 2

## 2019-12-16 MED ORDER — HEPARIN (PORCINE) IN NACL 2-0.9 UNITS/ML
INTRAMUSCULAR | Status: AC | PRN
  Administered 2019-12-16: 500 mL

## 2019-12-16 MED ORDER — SODIUM CHLORIDE 0.9% FLUSH: 3.0000 mL | Freq: Two times a day (BID) | INTRAVENOUS | Status: DC

## 2019-12-16 MED ORDER — PHENYLEPHRINE HCL-NACL 10-0.9 MG/250ML-% IV SOLN
INTRAVENOUS | Status: DC | PRN
  Administered 2019-12-16: 30 ug/min via INTRAVENOUS

## 2019-12-16 MED ORDER — SODIUM CHLORIDE 0.9 % IV SOLN: INTRAVENOUS | Status: DC

## 2019-12-16 MED ORDER — PROTAMINE SULFATE 10 MG/ML IV SOLN
INTRAVENOUS | Status: DC | PRN
  Administered 2019-12-16: 30 mg via INTRAVENOUS

## 2019-12-16 MED ORDER — LIDOCAINE 2% (20 MG/ML) 5 ML SYRINGE
INTRAMUSCULAR | Status: DC | PRN
  Administered 2019-12-16: 40 mg via INTRAVENOUS

## 2019-12-16 MED ORDER — HEPARIN SODIUM (PORCINE) 1000 UNIT/ML IJ SOLN
INTRAMUSCULAR | Status: DC | PRN
  Administered 2019-12-16: 1000 [IU] via INTRAVENOUS

## 2019-12-16 MED ORDER — PANTOPRAZOLE SODIUM 40 MG PO TBEC: 40.0000 mg | DELAYED_RELEASE_TABLET | Freq: Every day | ORAL | 0 refills | Status: DC

## 2019-12-16 MED ORDER — PROPOFOL 10 MG/ML IV BOLUS
INTRAVENOUS | Status: DC | PRN
  Administered 2019-12-16: 140 mg via INTRAVENOUS

## 2019-12-16 MED ORDER — ONDANSETRON HCL 4 MG/2ML IJ SOLN
INTRAMUSCULAR | Status: DC | PRN
  Administered 2019-12-16: 4 mg via INTRAVENOUS

## 2019-12-16 MED ORDER — ONDANSETRON HCL 4 MG/2ML IJ SOLN: 4.0000 mg | Freq: Four times a day (QID) | INTRAMUSCULAR | Status: DC | PRN

## 2019-12-16 MED ORDER — FENTANYL CITRATE (PF) 250 MCG/5ML IJ SOLN
INTRAMUSCULAR | Status: DC | PRN
Start: 2019-12-16 — End: 2019-12-16
  Administered 2019-12-16: 100 ug via INTRAVENOUS

## 2019-12-16 SURGICAL SUPPLY — 18 items
BLANKET WARM UNDERBOD FULL ACC (MISCELLANEOUS) ×2 IMPLANT
CATH 8FR REPROCESSED SOUNDSTAR (CATHETERS) ×2 IMPLANT
CATH MAPPNG PENTARAY F 2-6-2MM (CATHETERS) ×1 IMPLANT
CATH SMTCH THERMOCOOL SF DF (CATHETERS) ×2 IMPLANT
CATH WEBSTER BI DIR CS D-F CRV (CATHETERS) ×2 IMPLANT
CLOSURE PERCLOSE PROSTYLE (VASCULAR PRODUCTS) ×6 IMPLANT
COVER SWIFTLINK CONNECTOR (BAG) ×2 IMPLANT
NEEDLE BAYLIS TRANSSEPTAL 71CM (NEEDLE) ×2 IMPLANT
PACK EP LATEX FREE (CUSTOM PROCEDURE TRAY) ×2
PACK EP LF (CUSTOM PROCEDURE TRAY) ×1 IMPLANT
PAD PRO RADIOLUCENT 2001M-C (PAD) ×2 IMPLANT
PATCH CARTO3 (PAD) ×2 IMPLANT
PENTARAY F 2-6-2MM (CATHETERS) ×2
SHEATH PINNACLE 7F 10CM (SHEATH) ×4 IMPLANT
SHEATH PINNACLE 9F 10CM (SHEATH) ×2 IMPLANT
SHEATH PROBE COVER 6X72 (BAG) ×2 IMPLANT
SHEATH SWARTZ TS SL2 63CM 8.5F (SHEATH) ×2 IMPLANT
TUBING SMART ABLATE COOLFLOW (TUBING) ×2 IMPLANT

## 2019-12-16 NOTE — Anesthesia Procedure Notes (Signed)
Procedure Name: Intubation Date/Time: 12/16/2019 12:59 PM Performed by: Kyung Rudd, CRNA Pre-anesthesia Checklist: Patient identified, Emergency Drugs available, Suction available and Patient being monitored Patient Re-evaluated:Patient Re-evaluated prior to induction Oxygen Delivery Method: Circle system utilized Preoxygenation: Pre-oxygenation with 100% oxygen Induction Type: IV induction Ventilation: Mask ventilation without difficulty Laryngoscope Size: Mac and 3 Grade View: Grade II Tube type: Oral Tube size: 7.0 mm Number of attempts: 1 Airway Equipment and Method: Stylet Placement Confirmation: positive ETCO2 and breath sounds checked- equal and bilateral Secured at: 21 cm Tube secured with: Tape Dental Injury: Teeth and Oropharynx as per pre-operative assessment

## 2019-12-16 NOTE — Anesthesia Preprocedure Evaluation (Addendum)
Anesthesia Evaluation  Patient identified by MRN, date of birth, ID band Patient awake    Reviewed: Patient's Chart, lab work & pertinent test results  Airway Mallampati: I  TM Distance: >3 FB Neck ROM: Full    Dental  (+) Teeth Intact   Pulmonary neg pulmonary ROS,    Pulmonary exam normal        Cardiovascular hypertension, Pt. on medications + dysrhythmias Atrial Fibrillation  Rhythm:Irregular Rate:Bradycardia     Neuro/Psych  Headaches, negative psych ROS   GI/Hepatic Neg liver ROS, GERD  Medicated,  Endo/Other  Hypothyroidism   Renal/GU negative Renal ROS  negative genitourinary   Musculoskeletal  (+) Arthritis , Osteoarthritis,    Abdominal (+)  Abdomen: soft. Bowel sounds: normal.  Peds  Hematology negative hematology ROS (+)   Anesthesia Other Findings   Reproductive/Obstetrics                            Anesthesia Physical Anesthesia Plan  ASA: III  Anesthesia Plan: General   Post-op Pain Management:    Induction:   PONV Risk Score and Plan: 3 and Ondansetron, Dexamethasone and Treatment may vary due to age or medical condition  Airway Management Planned: Oral ETT and Mask  Additional Equipment: None  Intra-op Plan:   Post-operative Plan: Extubation in OR  Informed Consent: I have reviewed the patients History and Physical, chart, labs and discussed the procedure including the risks, benefits and alternatives for the proposed anesthesia with the patient or authorized representative who has indicated his/her understanding and acceptance.     Dental advisory given  Plan Discussed with: CRNA and Surgeon  Anesthesia Plan Comments: (ECHO 03/21: 1. Left ventricular ejection fraction, by estimation, is 55 to 60%. The  left ventricle has normal function. The left ventricle has no regional  wall motion abnormalities. The left ventricular internal cavity size was   mildly dilated. Left ventricular  diastolic parameters are indeterminate.  2. Right ventricular systolic function is normal. The right ventricular  size is normal. Tricuspid regurgitation signal is inadequate for assessing  PA pressure.  3. The mitral valve is normal in structure. Trivial mitral valve  regurgitation.  4. The aortic valve is tricuspid. Aortic valve regurgitation is mild to  moderate. No aortic stenosis is present.  5. Aortic dilatation noted. 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  6. The inferior vena cava is normal in size with greater than 50%  respiratory variability, suggesting right atrial pressure of 3 mmHg.  Lab Results      Component                Value               Date                      WBC                      8.6                 11/25/2019                HGB                      13.8                11/25/2019  HCT                      43.5                11/25/2019                MCV                      90                  11/25/2019                PLT                      177                 11/25/2019           Covid-19 Nucleic Acid Test Results Lab Results      Component                Value               Date                      SARSCOV2NAA              NEGATIVE            12/13/2019                SARSCOV2NAA              NEGATIVE            05/12/2019                [place]              NEGATIVE            04/30/2019          )        Anesthesia Quick Evaluation

## 2019-12-16 NOTE — Discharge Instructions (Signed)
Post procedure care instructions No driving for 4 days. No lifting over 5 lbs for 1 week. No vigorous or sexual activity for 1 week. You may return to work/your usual activities on 12/23/2019. Keep procedure site clean & dry. If you notice increased pain, swelling, bleeding or pus, call/return!  You may shower, but no soaking baths/hot tubs/pools for 1 week.     You have an appointment set up with the [facility].  Multiple studies have shown that being followed by a dedicated atrial fibrillation clinic in addition to the standard care you receive from your other physicians improves health. We believe that enrollment in the atrial fibrillation clinic will allow [place] to better care for you.   The phone number to the [facility] is [phone]. The clinic is staffed Monday through Friday from 8:30am to 5pm.  Parking Directions: The clinic is located in the Heart and Vascular Building connected to [facility]. 1)From [address] turn on to [address] and go to the 3rd entrance  (Heart and Vascular entrance) on the right. 2)Look to the right for Heart &Vascular Parking Garage. 3)A code for the entrance is required, for October is 3009.   4)Take the elevators to the 1st floor. Registration is in the room with the glass walls at the end of the hallway.  If you have any trouble parking or locating the clinic, please don't hesitate to call [phone].

## 2019-12-16 NOTE — Anesthesia Postprocedure Evaluation (Signed)
Anesthesia Post Note  Patient: Bridget [name]  Procedure(s) Performed: ATRIAL FIBRILLATION ABLATION (N/A )     Patient location during evaluation: PACU Anesthesia Type: General Level of consciousness: awake and alert Pain management: pain level controlled Vital Signs Assessment: post-procedure vital signs reviewed and stable Respiratory status: spontaneous breathing, nonlabored ventilation, respiratory function stable and patient connected to nasal cannula oxygen Cardiovascular status: blood pressure returned to baseline and stable Postop Assessment: no apparent nausea or vomiting Anesthetic complications: no   No complications documented.  Last Vitals:  Vitals:   12/16/19 1535 12/16/19 1555  BP: (!) 142/61   Pulse: 62   Resp: 17   Temp:  36.4 C  SpO2: 98%     Last Pain:  Vitals:   12/16/19 1529  TempSrc: Temporal  PainSc: 0-No pain                 Belenda Cruise P Horacio Werth

## 2019-12-16 NOTE — H&P (Signed)
Bridget [name] is a 79 y.o. female who presents today for EP study and ablation of atrial fibrillation/ atrial flutter.  Since last being seen in our clinic, the patient reports doing reasonably well. She does not feel that she is tolerating amiodarone.  + headaches, nausea, and tremulousness.  This has improved since reduce dose to 100mg  daily.   Today, she denies symptoms of palpitations, chest pain, shortness of breath,  lower extremity edema, dizziness, presyncope, or syncope.  The patient is otherwise without complaint today.       Past Medical History:  Diagnosis Date  . Allergy   . Arthritis    hip OA  . Degenerative disc disease   . GERD (gastroesophageal reflux disease)   . Hyperlipidemia   . Hypertension   . Hypothyroidism   . Migraines   . Neuromuscular disorder ([place])   . Neuropathy   . Seasonal allergies   . 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 CARDIOVERSION N/A 05/02/2019   Procedure: CARDIOVERSION;  Surgeon: Pixie Casino, MD;  Location: [facility] ENDOSCOPY;  Service: Cardiovascular;  Laterality: N/A;  . CARDIOVERSION N/A 05/14/2019   Procedure: CARDIOVERSION;  Surgeon: [name], MD;  Location: [facility] ENDOSCOPY;  Service: Cardiovascular;  Laterality: N/A;  . CATARACT EXTRACTION, BILATERAL    . CERVICAL DISCECTOMY  2005   with fusion  . CHOLECYSTECTOMY  2006  . COLONOSCOPY    . LUMBAR LAMINECTOMY/DECOMPRESSION MICRODISCECTOMY Right 11/13/2014   Procedure: Right Lumbar four-five Microdiskectomy;  Surgeon: Leeroy Cha, MD;  Location: [facility] NEURO ORS;  Service: Neurosurgery;  Laterality: Right;  right  . POLYPECTOMY    . 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 TEE WITHOUT CARDIOVERSION N/A 05/02/2019   Procedure: TRANSESOPHAGEAL ECHOCARDIOGRAM (TEE);  Surgeon: Pixie Casino, MD;  Location: [place];  Service: Cardiovascular;  Laterality: N/A;  . TOTAL ABDOMINAL  HYSTERECTOMY  1978  . TOTAL HIP ARTHROPLASTY Left 04/2011  . TRANSTHORACIC ECHOCARDIOGRAM  12/2008   EF=>55%, borderline conc LVH; trace MR; mod TR; trace AV regurg    ROS- all systems are reviewed and negatives except as per HPI above  Current Outpatient Medications  Medication Sig Dispense Refill  . AIMOVIG 140 MG/ML SOAJ Inject 140 mg into the skin every 30 (thirty) days.    Marland Kitchen amiodarone (PACERONE) 200 MG tablet TAKE 1 TABLET BY MOUTH EVERY DAY 90 tablet 1  . apixaban (ELIQUIS) 5 MG TABS tablet Take 1 tablet (5 mg total) by mouth 2 (two) times daily. 60 tablet 11  . b complex vitamins tablet Take 1 tablet by mouth daily. With vitamin c 500 mg    . Cholecalciferol (VITAMIN D-1000 MAX ST) 25 MCG (1000 UT) tablet Take 2,000 Units by mouth daily.     Marland Kitchen Dextran 70-Hypromellose (ARTIFICIAL TEARS PF OP) Place 1 drop into both eyes daily as needed (For dry eyes).    . furosemide (LASIX) 20 MG tablet Take 20 mg by mouth daily as needed for edema (weight gain).    Marland Kitchen gabapentin (NEURONTIN) 300 MG capsule Take 300 mg by mouth at bedtime.     . irbesartan (AVAPRO) 75 MG tablet Take 1 tablet (75 mg total) by mouth daily. 90 tablet 1  . levothyroxine (SYNTHROID, LEVOTHROID) 75 MCG tablet Take 75 mcg by mouth daily before breakfast.    . metroNIDAZOLE (METROCREAM) 0.75 % cream Apply 1 application topically once a week.    Marland Kitchen  promethazine (PHENERGAN) 25 MG tablet Take 25 mg by mouth every 6 (six) hours as needed for nausea.     . rosuvastatin (CRESTOR) 20 MG tablet Take 1 tablet (20 mg total) by mouth daily at 6 PM. 90 tablet 3  . traMADol (ULTRAM) 50 MG tablet Take 50-100 mg by mouth every 4 (four) hours as needed for moderate pain. as needed for migraine pain and neuropathy.    . zinc gluconate 50 MG tablet Take 50 mg by mouth daily.    . baclofen (LIORESAL) 10 MG tablet Take 0.5 tablets by mouth as needed.    . carvedilol (COREG) 3.125 MG tablet Take 3.125 mg by mouth 2  (two) times daily.     No current facility-administered medications for this visit.    Physical Exam: Today's Vitals   12/16/19 1046 12/16/19 1128  BP: 133/74   Pulse: 66   Temp: (!) 97.5 F (36.4 C)   TempSrc: Oral   SpO2: 100%   Weight: 73 kg   Height: 5\' 6"  (1.676 m)   PainSc:  (S) 8    Body mass index is 25.99 kg/m.  GEN- The patient is well appearing, alert and oriented x 3 today.   Head- normocephalic, atraumatic Eyes-  Sclera clear, conjunctiva pink Ears- hearing intact Oropharynx- clear Lungs-  normal work of breathing Heart- Regular rate and rhythm  GI- soft,  Extremities- no clubbing, cyanosis, or edema     Wt Readings from Last 3 Encounters:  11/14/19 165 lb (74.8 kg)  07/10/19 168 lb (76.2 kg)  05/30/19 165 lb (74.8 kg)    Assessment and Plan:  1. Persistent atrial fibrillation/ atrial flutter The patient has symptomatic, recurrent persistent atrial fibrillation and atrial flutter. she has failed medical therapy with amiodarone due to side effects. Chads2vasc score is 5.  she is anticoagulated with eliquis .  Risk, benefits, and alternatives to EP study and radiofrequency ablation for afib were again discussed in detail today. These risks include but are not limited to stroke, bleeding, vascular damage, tamponade, perforation, damage to the esophagus, lungs, and other structures, pulmonary vein stenosis, worsening renal function, and death. The patient understands these risk and wishes to proceed.   Cardiac CT reviewed with her at length.  She reports compliance with eliquis without interruption.  Thompson Grayer MD, [place] 12/16/2019 12:19 PM

## 2019-12-16 NOTE — Progress Notes (Signed)
Patient was given discharge instructions. She verbalized understanding. 

## 2019-12-16 NOTE — Transfer of Care (Signed)
Immediate Anesthesia Transfer of Care Note  Patient: Bridget [name]  Procedure(s) Performed: ATRIAL FIBRILLATION ABLATION (N/A )  Patient Location: PACU  Anesthesia Type:General  Level of Consciousness: awake, alert , oriented and patient cooperative  Airway & Oxygen Therapy: Patient Spontanous Breathing and Patient connected to face mask oxygen  Post-op Assessment: Report given to RN, Post -op Vital signs reviewed and stable and Patient moving all extremities  Post vital signs: Reviewed and stable  Last Vitals:  Vitals Value Taken Time  BP 147/61 12/16/19 1527  Temp    Pulse 62 12/16/19 1528  Resp 13 12/16/19 1528  SpO2 100 % 12/16/19 1528  Vitals shown include unvalidated device data.  Last Pain:  Vitals:   12/16/19 1128  TempSrc:   PainSc: (S) 8          Complications: No complications documented.

## 2019-12-17 ENCOUNTER — Encounter (HOSPITAL_COMMUNITY): Payer: Self-pay | Admitting: Internal Medicine

## 2019-12-17 LAB — POCT ACTIVATED CLOTTING TIME: Activated Clotting Time: 318 seconds

## 2020-01-13 ENCOUNTER — Ambulatory Visit (HOSPITAL_COMMUNITY)
Admission: RE | Admit: 2020-01-13 | Discharge: 2020-01-13 | Disposition: A | Discharge status: Home / Self Care | Payer: Medicare HMO | Source: Ambulatory Visit | Attending: Nurse Practitioner | Admitting: Nurse Practitioner

## 2020-01-13 ENCOUNTER — Other Ambulatory Visit: Payer: Self-pay

## 2020-01-13 ENCOUNTER — Encounter (HOSPITAL_COMMUNITY): Payer: Self-pay | Admitting: Nurse Practitioner

## 2020-01-13 VITALS — BP 110/68 | HR 64 | Ht 66.0 in | Wt 159.0 lb

## 2020-01-13 DIAGNOSIS — I4891 Unspecified atrial fibrillation: Secondary | ICD-10-CM | POA: Diagnosis not present

## 2020-01-13 DIAGNOSIS — I4892 Unspecified atrial flutter: Secondary | ICD-10-CM | POA: Insufficient documentation

## 2020-01-13 DIAGNOSIS — D6869 Other thrombophilia: Secondary | ICD-10-CM | POA: Diagnosis not present

## 2020-01-13 DIAGNOSIS — I5022 Chronic systolic (congestive) heart failure: Secondary | ICD-10-CM | POA: Insufficient documentation

## 2020-01-13 DIAGNOSIS — I4819 Other persistent atrial fibrillation: Secondary | ICD-10-CM | POA: Diagnosis present

## 2020-01-13 DIAGNOSIS — Z7901 Long term (current) use of anticoagulants: Secondary | ICD-10-CM | POA: Diagnosis not present

## 2020-01-13 DIAGNOSIS — I110 Hypertensive heart disease with heart failure: Secondary | ICD-10-CM | POA: Diagnosis not present

## 2020-01-13 NOTE — Progress Notes (Signed)
Primary Care Physician: Ronita Hipps, MD Referring Physician: Dr. Debara Pickett EP: Dr. Servando Salina is a 79 y.o. female with a h/o HTN, CHF,   Atrial flutter with 2:1 conduction recently diagnosed (04/30/19). Her new AFL had been treated with coreg and eliquis for CHA2DS2VASC of at least 5. She underwent TEE/DCCV 05/02/19 which was successful, but did show new systolic HF with EF 01-60% range, thought to be tachy-mediated.  She had recurrent AFL and underwent DCCV 2/19, within 24 hours she had reported back to the ED with recurrent DCCV 2/20, which was again only temporarily successful.   She presented to Dr. Lysbeth Penner  office 05/12/2019 with racing HR and SOB with profound fatigue. ECG showed ? AFL vs coarse AF with RVR at rate of 157 bpm.. Admitted for amiodarone load with consideration for recurrent DCCV vs ablation . She did have successful cardioversion. Dr. Rayann Heman saw on consult and recommended afib/flutter ablation in the near future.   She is now in the afib clinic for f/u. She has been  on amiodarone 200 mg bid since 2/22. She is in SR today. Qtc stable. She feels improved. She is not having any issues with her eliquis 5 mg bid. No fluid issues with being d/c on lasix 20 mg qd. Still notices some mild shortness of breath.  F/u in afib clinic, 01/13/20. She underwent  afib ablation one month ago with Dr. Rayann Heman  and is now in the afib clinic for f/u. She is in SR today and has not noted any afib. She continues on amiodarone 100 mg daily. No swallowing or groin issues. She is back to her normal activities. Taking eliquis 5 mg bid for a CHA2DS2VASc score of 5.   Today, she denies symptoms of palpitations, chest pain, shortness of breath, orthopnea, PND, lower extremity edema, dizziness, presyncope, syncope, or neurologic sequela. The patient is tolerating medications without difficulties and is otherwise without complaint today.   Past Medical History:  Diagnosis Date  . Allergy   .  Arthritis    hip OA  . Degenerative disc disease   . GERD (gastroesophageal reflux disease)   . Hyperlipidemia   . Hypertension   . Hypothyroidism   . Migraines   . Neuromuscular disorder ([place])   . Neuropathy   . Seasonal allergies   . 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 ATRIAL FIBRILLATION ABLATION N/A 12/16/2019   Procedure: ATRIAL FIBRILLATION ABLATION;  Surgeon: Thompson Grayer, MD;  Location: [place] [facility] LAB;  Service: Cardiovascular;  Laterality: N/A;  . CARDIOVERSION N/A 05/02/2019   Procedure: CARDIOVERSION;  Surgeon: Pixie Casino, MD;  Location: [facility] ENDOSCOPY;  Service: Cardiovascular;  Laterality: N/A;  . CARDIOVERSION N/A 05/14/2019   Procedure: CARDIOVERSION;  Surgeon: [name], MD;  Location: [facility] ENDOSCOPY;  Service: Cardiovascular;  Laterality: N/A;  . CATARACT EXTRACTION, BILATERAL    . CERVICAL DISCECTOMY  2005   with fusion  . CHOLECYSTECTOMY  2006  . COLONOSCOPY    . LUMBAR LAMINECTOMY/DECOMPRESSION MICRODISCECTOMY Right 11/13/2014   Procedure: Right Lumbar four-five Microdiskectomy;  Surgeon: Leeroy Cha, MD;  Location: [place] NEURO ORS;  Service: Neurosurgery;  Laterality: Right;  right  . POLYPECTOMY    . 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 TEE WITHOUT CARDIOVERSION N/A 05/02/2019   Procedure: TRANSESOPHAGEAL ECHOCARDIOGRAM (TEE);  Surgeon: Pixie Casino, MD;  Location: [place];  Service: Cardiovascular;  Laterality: N/A;  .  TOTAL ABDOMINAL HYSTERECTOMY  1978  . TOTAL HIP ARTHROPLASTY Left 04/2011  . TRANSTHORACIC ECHOCARDIOGRAM  12/2008   EF=>55%, borderline conc LVH; trace MR; mod TR; trace AV regurg    Current Outpatient Medications  Medication Sig Dispense Refill  . amiodarone (PACERONE) 200 MG tablet Take 0.5 tablets (100 mg total) by mouth daily. 30 tablet 5  . apixaban (ELIQUIS) 5 MG TABS tablet Take 1 tablet (5 mg total) by mouth 2 (two) times daily. 60 tablet 11  . b  complex vitamins tablet Take 1 tablet by mouth daily. With vitamin c 500 mg    . baclofen (LIORESAL) 10 MG tablet Take 0.5 tablets by mouth as needed (as needed for migraine pain and neuropathy.).     Marland Kitchen carvedilol (COREG) 3.125 MG tablet Take 3.125 mg by mouth 2 (two) times daily.    . Cholecalciferol (VITAMIN D-1000 MAX ST) 25 MCG (1000 UT) tablet Take 1,000 Units by mouth daily.     Marland Kitchen Dextran 70-Hypromellose (ARTIFICIAL TEARS PF OP) Place 1 drop into both eyes daily as needed (For dry eyes).    . furosemide (LASIX) 20 MG tablet Take 20 mg by mouth daily as needed for edema (weight gain).    Marland Kitchen gabapentin (NEURONTIN) 300 MG capsule Take 300 mg by mouth at bedtime.     . irbesartan (AVAPRO) 75 MG tablet Take 1 tablet (75 mg total) by mouth daily. 90 tablet 1  . levothyroxine (SYNTHROID, LEVOTHROID) 75 MCG tablet Take 75 mcg by mouth daily before breakfast.    . pantoprazole (PROTONIX) 40 MG tablet Take 1 tablet (40 mg total) by mouth daily. 45 tablet 0  . rosuvastatin (CRESTOR) 20 MG tablet Take 1 tablet (20 mg total) by mouth daily at 6 PM. 90 tablet 3  . traMADol (ULTRAM) 50 MG tablet Take 50-100 mg by mouth every 4 (four) hours as needed for moderate pain (as needed for migraine pain and neuropathy.).     Marland Kitchen zinc gluconate 50 MG tablet Take 50 mg by mouth daily.    Marland Kitchen AIMOVIG 140 MG/ML SOAJ Inject 140 mg into the skin every 30 (thirty) days.    . promethazine (PHENERGAN) 25 MG tablet Take 25 mg by mouth every 6 (six) hours as needed for nausea.  (Patient not taking: Reported on 01/13/2020)     No current facility-administered medications for this encounter.    Allergies  Allergen Reactions  . Codeine Anaphylaxis  . Sulfonamide Derivatives Anaphylaxis  . 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 Amlodipine Other (See Comments)    swelling  . Chocolate Other (See Comments)    Headache  . Drug Ingredient [Fava Beans] Other (See Comments)     Headache  . Onion Other (See Comments)    Headache    Social History   Socioeconomic History  . Marital status: Married    Spouse name: Not on file  . Number of children: 2  . Years of education: Not on file  . Highest education level: Not on file  Occupational History  . Occupation: retired    Comment: medical records admin  Tobacco Use  . Smoking status: Never Smoker  . Smokeless tobacco: Never Used  Vaping Use  . Vaping Use: Never used  Substance and Sexual Activity  . Alcohol use: No  . Drug use: No  . Sexual activity: Not on file  Other Topics Concern  . Not on file  Social History  Narrative  . Not on file   Social Determinants of Health   Financial Resource Strain:   . Difficulty of Paying Living Expenses: Not on file  Food Insecurity:   . Worried About Charity fundraiser in the Last Year: Not on file  . Ran Out of Food in the Last Year: Not on file  Transportation Needs:   . Lack of Transportation (Medical): Not on file  . Lack of Transportation (Non-Medical): Not on file  Physical Activity:   . Days of Exercise per Week: Not on file  . Minutes of Exercise per Session: Not on file  Stress:   . Feeling of Stress : Not on file  Social Connections:   . Frequency of Communication with Friends and Family: Not on file  . Frequency of Social Gatherings with Friends and Family: Not on file  . Attends Religious Services: Not on file  . Active Member of Clubs or Organizations: Not on file  . Attends Archivist Meetings: Not on file  . Marital Status: Not on file  Intimate Partner Violence:   . Fear of Current or Ex-Partner: Not on file  . Emotionally Abused: Not on file  . Physically Abused: Not on file  . Sexually Abused: Not on file    Family History  Problem Relation Age of Onset  . Stroke Mother 19  . Coronary artery disease Father 71  . Colon polyps Father   . Colon cancer Cousin   . Esophageal cancer Cousin   . Stroke Maternal  Grandfather   . Stroke Paternal Grandmother   . Breast cancer Paternal Grandmother   . Suicidality Daughter   . Rectal cancer Neg Hx   . Stomach cancer Neg Hx     ROS- All systems are reviewed and negative except as per the HPI above  Physical Exam: Vitals:   01/13/20 1059  BP: 110/68  Pulse: 64  Weight: 72.1 kg  Height: 5\' 6"  (1.676 m)   Wt Readings from Last 3 Encounters:  01/13/20 72.1 kg  12/16/19 73 kg  12/09/19 73 kg    Labs: Lab Results  Component Value Date   NA 142 12/09/2019   K 4.0 12/09/2019   CL 104 12/09/2019   CO2 28 12/09/2019   GLUCOSE 83 12/09/2019   BUN 18 12/09/2019   CREATININE 1.30 (H) 12/09/2019   CALCIUM 9.6 12/09/2019   MG 2.1 05/12/2019   No results found for: INR Lab Results  Component Value Date   CHOL 221 (H) 05/13/2019   HDL 48 05/13/2019   LDLCALC 148 (H) 05/13/2019   TRIG 124 05/13/2019     GEN- The patient is well appearing, alert and oriented x 3 today.   Head- normocephalic, atraumatic Eyes-  Sclera clear, conjunctiva pink Ears- hearing intact Oropharynx- clear Neck- supple, no JVP Lymph- no cervical lymphadenopathy Lungs- Clear to ausculation bilaterally, normal work of breathing Heart- Regular rate and rhythm, no murmurs, rubs or gallops, PMI not laterally displaced GI- soft, NT, ND, + BS Extremities- no clubbing, cyanosis, or edema MS- no significant deformity or atrophy Skin- no rash or lesion Psych- euthymic mood, full affect Neuro- strength and sensation are intact  EKG-NSR at 64 bpm, pr int 172 ms, qrs int 86 ms, qtc 468 ms   TEE-1. Left ventricular ejection fraction, by estimation, is 30 to 35%. The left ventricle has moderately decreased function. The left ventricle demonstrates global hypokinesis. The left ventricular internal cavity size was mildly dilated. Left ventricular  diastolic function could not be evaluated. 2. Right ventricular systolic function is normal. The right ventricular size is  normal. 3. Left atrial size was mildly dilated. No left atrial/left atrial appendage thrombus was detected. 4. The mitral valve is grossly normal. Mild mitral valve regurgitation. 5. The aortic valve is tricuspid. Aortic valve regurgitation is mild. 6. Cannot exclude small PFO. Conclusion(s)/Recomendation(s): No LA/LAA thrombus identified. Successful cardioversion performed with restoration of normal sinus rhythm.   Assessment and Plan: 1. Persistent afib/flutter She underwent TEE/DCCV 2/12 -> DCCV 2/19 and 2/20.  Seen in Dr. Lysbeth Penner office 2/22 and back in AF , sent for  admission,received amiodarone and subsequent successful TEE guided cardioversion LA size "mildly dilated" Dr. Rayann Heman thought pt to be an ablation candidate and has now had afib ablation 12/16/19 She is staying in SR and feels improved Continue  amiodarone at 100 mg daily   2. CHA2DS2VASc score of 5 Continue eliquis 5 mg bid  Reminded not to stop drug for the remaining 2 month waiting period    3. 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 Rayann Heman 03/23/19 afib clinic as needed   Geroge Baseman. Mila [facility] [address] [place], [place] [address] [phone]  .

## 2020-01-23 ENCOUNTER — Other Ambulatory Visit: Payer: Self-pay | Admitting: Internal Medicine

## 2020-01-23 NOTE — Telephone Encounter (Signed)
Pt's pharmacy is requesting a refill on pantoprazole. Would Dr. Rayann Heman like to refill this medication?: please address

## 2020-02-05 ENCOUNTER — Telehealth: Payer: Self-pay | Admitting: Internal Medicine

## 2020-02-05 NOTE — Telephone Encounter (Signed)
Pharmacy please comment on the request to hold anticoagulation 3 days prior to L4 nerve block and then we will contact the patient for clearance.  Kerin Ransom PA-C 02/05/2020 4:18 PM

## 2020-02-05 NOTE — Telephone Encounter (Signed)
Patient calling to ask if she is able to hold eloquis for 3 days to get a spinal injection done at [facility]. Please call/advise.  Thank you!

## 2020-02-05 NOTE — Telephone Encounter (Signed)
Patient with diagnosis of afib on Eliquis for anticoagulation.    Procedure: RIGHT L4 SELECT NERVE ROOT BLOCK  Date of procedure: TBD  CHA2DS2-VASc Score = 5  This indicates a 7.2% annual risk of stroke. The patient's score is based upon: CHF History: 1 HTN History: 1 Diabetes History: 0 Stroke History: 0 Vascular Disease History: 0 Age Score: 2 Gender Score: 1  CrCl 62mL/min Platelet count 177K  Per office protocol, patient can hold Eliquis for 3 days prior to procedure, however she will need to complete 3 months of uninterrupted anticoagulation post ablation (12/16/19). 12 weeks would put her out to 12/21 for beginning her hold, so earliest procedure date would need to be after Christmas.

## 2020-02-05 NOTE — Telephone Encounter (Signed)
   [facility] Pre-operative Risk Assessment    HEARTCARE STAFF: - Please ensure there is not already an duplicate clearance open for this procedure. - Under Visit Info/Reason for Call, type in Other and utilize the format Clearance MM/DD/YY or Clearance TBD. Do not use dashes or single digits. - If request is for dental extraction, please clarify the # of teeth to be extracted.  Request for surgical clearance:  1. What type of surgery is being performed? RIGHT L4 SELECT NERVE ROOT BLOCK   2. When is this surgery scheduled? TBD   3. What type of clearance is required (medical clearance vs. Pharmacy clearance to hold med vs. Both)? BOTH  4. Are there any medications that need to be held prior to surgery and how long? ELIQUIS x 3 DAYS PRIOR   5. Practice name and name of physician performing surgery? [facility]; DR. Maia Petties  6. What is the office phone number? [phone]   7.   What is the office fax number? [phone]  8.   Anesthesia type (None, local, MAC, general) ? LOCAL?   Julaine Hua 02/05/2020, 4:07 PM  _________________________________________________________________   (provider comments below)

## 2020-02-06 NOTE — Telephone Encounter (Signed)
   Primary Cardiologist: Pixie Casino, MD  Chart reviewed as part of pre-operative protocol coverage. Patient was contacted 02/06/2020 in reference to pre-operative risk assessment for pending surgery as outlined below.  Bridget [name] was last seen on 01/13/20 by Roderic Palau, NP in the atrial fibrillation clinic.  Since that day, Bridget [name] has done fine from a cardiac standpoint. She is limited in activity by neuropathy, though has no anginal complaints. These procedures are low risk.  Therefore, based on ACC/AHA guidelines, the patient would be at acceptable risk for the planned procedure without further cardiovascular testing.   Per pharmacy recommendations, procedure should be delayed until 03/19/20 at the earliest given recent atrial flutter ablation 12/16/19. After patient completes 3 months of uninterrupted anticoagulation, she may hold eliquis 3 days prior to her surgery with plans to restart as soon as she is cleared to do so by her surgeon.  The patient was advised that if she develops new symptoms prior to surgery to contact our office to arrange for a follow-up visit, and she verbalized understanding.  I will route this recommendation to the requesting party via Epic fax function and remove from pre-op pool. Please call with questions.  Bridget Butts, PA-C 02/06/2020, 9:37 AM

## 2020-02-11 ENCOUNTER — Telehealth: Payer: Self-pay | Admitting: Internal Medicine

## 2020-02-11 NOTE — Telephone Encounter (Signed)
Patient states her nose has been bleeding for 15 minutes and would like to know if there is any other advise to help it stop. She states it is not bleeding too bad, but has not stopped and she is on a blood thinner.

## 2020-02-11 NOTE — Telephone Encounter (Signed)
Spoke with patient. Patient reports her nose has been bleeding for the last fifteen minutes. She has been placing a tissue inside of her nostril and has changed it about four times. The tissue is not saturated or soaked. She believes she blew her nose too hard.   Spoke with Theodoro Grist. Advised for patient to obtain Afrin nasal spray and apply one squirt to the affected nostril now and at bedtime. If the patient's nose continues to bleed she needs to go to the ER.   Patient made aware of recommendations and verbalized understanding.

## 2020-03-22 ENCOUNTER — Encounter: Payer: Self-pay | Admitting: *Deleted

## 2020-03-22 ENCOUNTER — Encounter: Payer: Self-pay | Admitting: Internal Medicine

## 2020-03-22 ENCOUNTER — Other Ambulatory Visit: Payer: Self-pay

## 2020-03-22 ENCOUNTER — Ambulatory Visit: Payer: Medicare HMO | Admitting: Internal Medicine

## 2020-03-22 VITALS — BP 118/72 | HR 63 | Ht 66.0 in | Wt 154.0 lb

## 2020-03-22 DIAGNOSIS — I4819 Other persistent atrial fibrillation: Secondary | ICD-10-CM | POA: Diagnosis not present

## 2020-03-22 DIAGNOSIS — I519 Heart disease, unspecified: Secondary | ICD-10-CM

## 2020-03-22 DIAGNOSIS — I483 Typical atrial flutter: Secondary | ICD-10-CM | POA: Diagnosis not present

## 2020-03-22 NOTE — Patient Instructions (Addendum)
Medication Instructions:  Stop Amiodarone Stop Protonix Stop carvedilol    *If you need a refill on your cardiac medications before your next appointment, please call your pharmacy*  Lab Work: None ordered.  If you have labs (blood work) drawn today and your tests are completely normal, you will receive your results only by: Marland Kitchen MyChart Message (if you have MyChart) OR . A paper copy in the mail If you have any lab test that is abnormal or we need to change your treatment, we will call you to review the results.  Testing/Procedures: None ordered.  Follow-Up: At [facility], you and your health needs are our priority.  As part of our continuing mission to provide you with exceptional heart care, we have created designated Provider Care Teams.  These Care Teams include your primary Cardiologist (physician) and Advanced Practice Providers (APPs -  Physician Assistants and Nurse Practitioners) who all work together to provide you with the care you need, when you need it.  We recommend signing up for the patient portal called "MyChart".  Sign up information is provided on this After Visit Summary.  MyChart is used to connect with patients for Virtual Visits (Telemedicine).  Patients are able to view lab/test results, encounter notes, upcoming appointments, etc.  Non-urgent messages can be sent to your provider as well.   To learn more about what you can do with MyChart, go to [email or web address].    Your next appointment:   Your physician wants you to follow-up in: 06/21/20 at 11:45 am with Dr. Johney Frame.     Other Instructions:

## 2020-03-22 NOTE — Progress Notes (Signed)
PCP: Marylen Ponto, MD Primary Cardiologist: Dr Conley Simmonds is a 80 y.o. female who presents today for routine electrophysiology followup.  Since his recent afib ablation, the patient reports doing very well.  she denies procedure related complications and is pleased with the results of the procedure.  Over the past few months (since her COVID vaccine), she has had symptoms of head tremor, confusion, and difficulty with thought processing.  She has been evaluated by PCP and thyroid medicine adjusted.  No afib events.  Today, she denies symptoms of palpitations, chest pain, shortness of breath,  lower extremity edema, dizziness, presyncope, or syncope.  The patient is otherwise without complaint today.   Past Medical History:  Diagnosis Date  . Allergy   . Arthritis    hip OA  . Degenerative disc disease   . GERD (gastroesophageal reflux disease)   . Hyperlipidemia   . Hypertension   . Hypothyroidism   . Migraines   . Neuromuscular disorder (HCC)   . Neuropathy   . Seasonal allergies   . 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 ATRIAL FIBRILLATION ABLATION N/A 12/16/2019   Procedure: ATRIAL FIBRILLATION ABLATION;  Surgeon: Hillis Range, MD;  Location: [facility] INVASIVE CV LAB;  Service: Cardiovascular;  Laterality: N/A;  . CARDIOVERSION N/A 05/02/2019   Procedure: CARDIOVERSION;  Surgeon: Chrystie Nose, MD;  Location: [facility] ENDOSCOPY;  Service: Cardiovascular;  Laterality: N/A;  . CARDIOVERSION N/A 05/14/2019   Procedure: CARDIOVERSION;  Surgeon: Parke Poisson, MD;  Location: [facility] ENDOSCOPY;  Service: Cardiovascular;  Laterality: N/A;  . CATARACT EXTRACTION, BILATERAL    . CERVICAL DISCECTOMY  2005   with fusion  . CHOLECYSTECTOMY  2006  . COLONOSCOPY    . LUMBAR LAMINECTOMY/DECOMPRESSION MICRODISCECTOMY Right 11/13/2014   Procedure: Right Lumbar four-five Microdiskectomy;  Surgeon: Hilda Lias, MD;  Location: [facility]  NEURO ORS;  Service: Neurosurgery;  Laterality: Right;  right  . POLYPECTOMY    . 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 TEE WITHOUT CARDIOVERSION N/A 05/02/2019   Procedure: TRANSESOPHAGEAL ECHOCARDIOGRAM (TEE);  Surgeon: Chrystie Nose, MD;  Location: [facility] ENDOSCOPY;  Service: Cardiovascular;  Laterality: N/A;  . TOTAL ABDOMINAL HYSTERECTOMY  1978  . TOTAL HIP ARTHROPLASTY Left 04/2011  . TRANSTHORACIC ECHOCARDIOGRAM  12/2008   EF=>55%, borderline conc LVH; trace MR; mod TR; trace AV regurg    ROS- all systems are personally reviewed and negatives except as per HPI above  Current Outpatient Medications  Medication Sig Dispense Refill  . AIMOVIG 140 MG/ML SOAJ Inject 140 mg into the skin every 30 (thirty) days.    Marland Kitchen amiodarone (PACERONE) 200 MG tablet Take 0.5 tablets (100 mg total) by mouth daily. 30 tablet 5  . apixaban (ELIQUIS) 5 MG TABS tablet Take 1 tablet (5 mg total) by mouth 2 (two) times daily. 60 tablet 11  . b complex vitamins tablet Take 1 tablet by mouth daily. With vitamin c 500 mg    . baclofen (LIORESAL) 10 MG tablet Take 0.5 tablets by mouth as needed (as needed for migraine pain and neuropathy.).     Marland Kitchen carvedilol (COREG) 3.125 MG tablet Take 3.125 mg by mouth 2 (two) times daily.    . Cholecalciferol 25 MCG (1000 UT) tablet Take 1,000 Units by mouth daily.     Marland Kitchen Dextran 70-Hypromellose (ARTIFICIAL TEARS PF OP) Place 1 drop into both eyes daily as needed (For dry eyes).    Marland Kitchen  furosemide (LASIX) 20 MG tablet Take 20 mg by mouth daily as needed for edema (weight gain).    Marland Kitchen gabapentin (NEURONTIN) 300 MG capsule Take 300 mg by mouth at bedtime.     . irbesartan (AVAPRO) 75 MG tablet Take 1 tablet (75 mg total) by mouth daily. 90 tablet 1  . levothyroxine (SYNTHROID, LEVOTHROID) 75 MCG tablet Take 75 mcg by mouth daily before breakfast.    . promethazine (PHENERGAN) 25 MG tablet Take 25 mg by mouth every 6 (six) hours as needed for nausea.    . rosuvastatin (CRESTOR) 20 MG  tablet Take 1 tablet (20 mg total) by mouth daily at 6 PM. 90 tablet 3  . sertraline (ZOLOFT) 25 MG tablet Take 25 mg by mouth daily.    . traMADol (ULTRAM) 50 MG tablet Take 50-100 mg by mouth every 4 (four) hours as needed for moderate pain (as needed for migraine pain and neuropathy.).     Marland Kitchen zinc gluconate 50 MG tablet Take 50 mg by mouth daily.    . pantoprazole (PROTONIX) 40 MG tablet Take 1 tablet (40 mg total) by mouth daily. 45 tablet 0   No current facility-administered medications for this visit.    Physical Exam: Vitals:   03/22/20 1123  BP: 118/72  Pulse: 63  SpO2: 95%  Weight: 154 lb (69.9 kg)  Height: 5\' 6"  (1.676 m)    GEN- The patient is well appearing, alert and oriented x 3 today.   Head- normocephalic, atraumatic Eyes-  Sclera clear, conjunctiva pink Ears- hearing intact Oropharynx- clear Lungs- Clear to ausculation bilaterally, normal work of breathing Heart- Regular rate and rhythm, no murmurs, rubs or gallops, PMI not laterally displaced GI- soft, NT, ND, + BS Extremities- no clubbing, cyanosis, or edema Neuro- CN II-XII intact, strength/ sensation intact, FNF exam WNL  EKG tracing ordered today is personally reviewed and shows sinus rhythm  Assessment and Plan:  1. Persistent atrial fibrillation and atrial flutter Doing well s/p ablation Stop amiodarone today chads2vasc score is 5.  She is on eliquis  2. Chronic systolic dysfunction Recovered with sinus Will be important to keep in sinus long term Stop coreg given tremor and fatigue.  3. Tremor, fatigue, confusion Stop amiodarone and coreg Refer to Dr Jaynee Eagles for neurology assessment Follow-up with PCP She has been started on zoloft for concerns of depression  Return to see me in 3 months  Thompson Grayer MD, [facility] 03/22/2020 11:56 AM

## 2020-04-07 DIAGNOSIS — Z961 Presence of intraocular lens: Secondary | ICD-10-CM | POA: Diagnosis not present

## 2020-05-06 DIAGNOSIS — L97921 Non-pressure chronic ulcer of unspecified part of left lower leg limited to breakdown of skin: Secondary | ICD-10-CM | POA: Diagnosis not present

## 2020-05-06 DIAGNOSIS — L0100 Impetigo, unspecified: Secondary | ICD-10-CM | POA: Diagnosis not present

## 2020-05-12 ENCOUNTER — Other Ambulatory Visit: Payer: Self-pay | Admitting: Internal Medicine

## 2020-05-17 DIAGNOSIS — E7849 Other hyperlipidemia: Secondary | ICD-10-CM | POA: Diagnosis not present

## 2020-05-17 DIAGNOSIS — I10 Essential (primary) hypertension: Secondary | ICD-10-CM | POA: Diagnosis not present

## 2020-05-17 DIAGNOSIS — E039 Hypothyroidism, unspecified: Secondary | ICD-10-CM | POA: Diagnosis not present

## 2020-05-20 DIAGNOSIS — G8929 Other chronic pain: Secondary | ICD-10-CM | POA: Diagnosis not present

## 2020-05-20 DIAGNOSIS — G629 Polyneuropathy, unspecified: Secondary | ICD-10-CM | POA: Diagnosis not present

## 2020-05-20 DIAGNOSIS — I10 Essential (primary) hypertension: Secondary | ICD-10-CM | POA: Diagnosis not present

## 2020-05-20 DIAGNOSIS — E785 Hyperlipidemia, unspecified: Secondary | ICD-10-CM | POA: Diagnosis not present

## 2020-05-20 DIAGNOSIS — Z7901 Long term (current) use of anticoagulants: Secondary | ICD-10-CM | POA: Diagnosis not present

## 2020-05-20 DIAGNOSIS — E039 Hypothyroidism, unspecified: Secondary | ICD-10-CM | POA: Diagnosis not present

## 2020-06-01 ENCOUNTER — Telehealth: Payer: Self-pay | Admitting: Neurology

## 2020-06-01 ENCOUNTER — Ambulatory Visit: Payer: Medicare HMO | Admitting: Neurology

## 2020-06-01 ENCOUNTER — Encounter: Payer: Self-pay | Admitting: Neurology

## 2020-06-01 ENCOUNTER — Other Ambulatory Visit: Payer: Self-pay

## 2020-06-01 ENCOUNTER — Other Ambulatory Visit: Payer: Self-pay | Admitting: Neurology

## 2020-06-01 VITALS — BP 110/69 | HR 70 | Ht 67.0 in | Wt 161.0 lb

## 2020-06-01 DIAGNOSIS — E538 Deficiency of other specified B group vitamins: Secondary | ICD-10-CM | POA: Diagnosis not present

## 2020-06-01 DIAGNOSIS — R270 Ataxia, unspecified: Secondary | ICD-10-CM | POA: Diagnosis not present

## 2020-06-01 DIAGNOSIS — E519 Thiamine deficiency, unspecified: Secondary | ICD-10-CM | POA: Diagnosis not present

## 2020-06-01 DIAGNOSIS — M542 Cervicalgia: Secondary | ICD-10-CM | POA: Diagnosis not present

## 2020-06-01 DIAGNOSIS — G43711 Chronic migraine without aura, intractable, with status migrainosus: Secondary | ICD-10-CM

## 2020-06-01 DIAGNOSIS — R519 Headache, unspecified: Secondary | ICD-10-CM

## 2020-06-01 DIAGNOSIS — R2990 Unspecified symptoms and signs involving the nervous system: Secondary | ICD-10-CM

## 2020-06-01 DIAGNOSIS — G8929 Other chronic pain: Secondary | ICD-10-CM

## 2020-06-01 DIAGNOSIS — R413 Other amnesia: Secondary | ICD-10-CM | POA: Diagnosis not present

## 2020-06-01 DIAGNOSIS — R799 Abnormal finding of blood chemistry, unspecified: Secondary | ICD-10-CM | POA: Diagnosis not present

## 2020-06-01 DIAGNOSIS — R51 Headache with orthostatic component, not elsewhere classified: Secondary | ICD-10-CM | POA: Diagnosis not present

## 2020-06-01 DIAGNOSIS — E559 Vitamin D deficiency, unspecified: Secondary | ICD-10-CM | POA: Diagnosis not present

## 2020-06-01 DIAGNOSIS — G43009 Migraine without aura, not intractable, without status migrainosus: Secondary | ICD-10-CM

## 2020-06-01 DIAGNOSIS — G9600 Cerebrospinal fluid leak, unspecified: Secondary | ICD-10-CM | POA: Diagnosis not present

## 2020-06-01 DIAGNOSIS — E539 Vitamin B deficiency, unspecified: Secondary | ICD-10-CM | POA: Diagnosis not present

## 2020-06-01 MED ORDER — EMGALITY 120 MG/ML ~~LOC~~ SOAJ: 120.0000 mg | SUBCUTANEOUS | 11 refills | Status: DC

## 2020-06-01 MED ORDER — UBRELVY 100 MG PO TABS: 100.0000 mg | ORAL_TABLET | ORAL | 11 refills | Status: DC | PRN

## 2020-06-01 MED ORDER — EMGALITY 120 MG/ML ~~LOC~~ SOAJ: 240.0000 mg | Freq: Once | SUBCUTANEOUS | 0 refills | Status: AC

## 2020-06-01 NOTE — Progress Notes (Addendum)
[facility]    Provider:  Dr Jaynee Eagles Requesting Provider: Thompson Grayer, MD Primary Care Provider:  Ronita Hipps, MD  CC:  Tremor in the head and memory  HPI:  Bridget [name] is a 80 y.o. female here as requested by Thompson Grayer, MD for tremor in the head and memory complaints.  Past medical history degenerative disc disease, hyperlipidemia, hypertension, hypothyroidism, migraines, neuropathy.  Patient has atrial fibrillation and follows with Dr. Thompson Grayer, per Dr. Jackalyn Lombard notes: since his recent ablation patient reports doing very well, no procedure related complications, over the past few months has had symptoms of head tremor, confusion and difficulty with thought processing, she has been evaluated by primary care and thyroid medicine adjusted, no A. fib events, the patient is otherwise without complaint.  Dr. Rayann Heman stopped her amiodarone and Coreg, she has been started on Zoloft for concerns of depression.  Patient here with daughter. She has had migraines since the age of 37. Baclofen has helped the tremor. Tremor started at least back in October even before then. Daughter can see it. No family hx of tremors. Tremor is better with stopping the amiodarone especially in the hands, It was more with holding things.   She has migraines. They are unilateral, in the back of the head, Can be unilateral. It is pulsating, pounding throbbing, vomiting, light sensitivity, she has a migraine every day, better laying down and starts when she gets up in the morning, gets better with laying down, she takes tramadol (understands about medication rebound). She cannot take imitrex she had to call the ambulance, she tried botox once, no snoring, doesn't think the headache is there apon waking. Daily headaches and migraines, start after she gets up in the morning, positional, no aura, no medication overuse.  We had a long discussion, spent over 70 minutes with patient and daughter, we  should follow up on the above and ensure no other causes of her intractable headaches. Spent further time reviewing chart and Images.    Patient has tried metoprolol 50mg , carvedilol 6.25mg , benazepril 10 mg, amlodipine 5mg , all for 2 months or more    Reviewed notes, labs and imaging from outside physicians, which showed:    CT Head wo contrast 06/13/2012: reviewed report * The brain parenchyma is unremarkable for the patient's age.  * No evidence of hydrocephalus.  * No intracranial mass, mass effect or midline shift.  * No acute infarction evident.  * No intracranial hemorrhage.  * Paranasal sinuses: Clear.  * Mastoid air cells: Clear.  * Calvarium: Intact.   IMPRESSION:  No acute intracranial abnormality.   CT cervical spine 2004:  FINDINGS  CLINICAL DATA: NECK PAIN.  CERVICAL MYELOGRAM  THERE IS NORMAL ALIGNMENT. THERE IS DISC DEGENERATION AND SPONDYLOSIS AT C3-4, C4-5, C5-6, AND AT  C6-7. THERE IS SUBOPTIMAL OPACIFICATION OF THE CERVICAL SPINE. SEE CT RESULTS BELOW.  IMPRESSION  CERVICAL SPONDYLOSIS.  CT MYELOGRAM OF THE CERVICAL SPINE  C2-3: NEGATIVE.  C3-4: THERE IS MILD TO MODERATE SPONDYLOSIS WITH DIFFUSE UNCOVERTEBRAL SPURRING AND FACET  ARTHROPATHY, PARTICULARLY ON THE RIGHT. THERE IS RIGHT FORAMINAL ENCROACHMENT AND MILD FLATTENING  OF THE CORD ON THE RIGHT.  C4-5: DISC DEGENERATION WITH SPONDYLOSIS. THERE IS UNCOVERTEBRAL SPURRING, RIGHT GREATER THAN  LEFT WITHOUT CORD DEFORMITY. THERE IS MILD RIGHT FORAMINAL ENCROACHMENT.  C5-6: THERE IS MODERATE UNCOVERTEBRAL SPURRING BILATERALLY WITH A SUPERIMPOSED FOCAL SPUR ON THE  LEFT SIDE. THERE IS FLATTENING OF THE CORD BILATERALLY DUE TO MODERATE SPINAL STENOSIS. THE  NEURAL FORAMINA ARE NARROWED BILATERALLY.  C6-7: THERE IS RIGHT FORAMINAL ENCROACHMENT DUE TO UNCOVERTEBRAL SPURRING. THERE IS NO  SIGNIFICANT SPINAL STENOSIS.  C7-T1: NEGATIVE.  T1-2: SMALL LEFT-SIDED SPUR WITHOUT CORD  DEFORMITY.  IMPRESSION  CERVICAL SPONDYLOSIS C3-4, C4-5, C5-6, AND C6-7. THERE IS MILD TO MODERATE SPINAL STENOSIS AND  FLATTENING OF THE CORD AT C5-6.  MULTIPLANAR RECONSTRUCTION OF THE CERVICAL SPINE WITH INTRATHECAL CONTRAST  SAGITTAL RECONSTRUCTIONS REVEAL NORMAL ALIGNMENT. DISC DEGENERATION AND SPONDYLOSIS ARE NOTED AT  C3-4, C4-5, C5-6, AND C6-7 AS DESCRIBED ABOVE. THERE IS NO FRACTURE. SPONDYLOSIS IS MOST SEVERE  AT C5-6.  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 Review of Systems: Patient complains of symptoms per HPI as well as the following symptoms: imbalance, depression. Pertinent negatives and positives per HPI. All others negative.   Social History   Socioeconomic History  . Marital status: Married    Spouse name: Not on file  . Number of children: 2  . Years of education: 37  . Highest education level: Not on file  Occupational History  . Occupation: retired    Comment: medical records admin  Tobacco Use  . Smoking status: Never Smoker  . Smokeless tobacco: Never Used  Vaping Use  . Vaping Use: Never used  Substance and Sexual Activity  . Alcohol use: No  . Drug use: No  . Sexual activity: Not on file  Other Topics Concern  . Not on file  Social History Narrative   Lives at home with husband   Right handed   Caffeine: maybe 1 cup/day   Social Determinants of Health   Financial Resource Strain: Not on file  Food Insecurity: Not on file  Transportation Needs: Not on file  Physical Activity: Not on file  Stress: Not on file  Social Connections: Not on file  Intimate Partner Violence: Not on file    Family History  Problem Relation Age of Onset  . Stroke Mother 14  . Coronary artery disease Father 38  . Colon polyps Father   . Colon cancer Cousin   . Esophageal cancer Cousin   . Stroke Maternal Grandfather   . Stroke Paternal Grandmother   . Breast cancer Paternal Grandmother   . Suicidality Daughter   . Rectal cancer  Neg Hx   . Stomach cancer Neg Hx   . Tremor Neg Hx   . Parkinson's disease Neg Hx   . Alzheimer's disease Neg Hx   . Dementia Neg Hx     Past Medical History:  Diagnosis Date  . Allergy   . Arthritis    hip OA  . Degenerative disc disease   . GERD (gastroesophageal reflux disease)   . Hyperlipidemia   . Hypertension   . Hypothyroidism   . Migraines   . Neuromuscular disorder ([place])   . Neuropathy   . Seasonal allergies   . 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 Persistent atrial fibrillation ([place]) 07/10/2019  . Acute systolic (congestive) heart failure ([place]) 05/13/2019  . Atrial fibrillation with RVR ([place]) 05/12/2019  . Atrial flutter ([place])   . Back pain 04/18/2015  . Lumbar herniated disc 11/13/2014  . Peripheral polyneuropathy 07/30/2013  . Migraine headache 07/30/2013  . Chronic neck pain 07/10/2013  . ADENOMATOUS COLONIC POLYP 11/12/2008  . Hypothyroidism 08/22/2007  . Lumbar radiculopathy 08/22/2007  . Disorder of joint 08/22/2007  . Arthropathia 08/22/2007  . Hyperlipidemia 08/21/2007  .  Essential hypertension 08/21/2007  . Gastroesophageal reflux disease 08/21/2007  . Diverticulosis of large intestine 08/21/2007  . Headache 08/21/2007  . History of colonic polyps 08/21/2007  . History of disease 08/21/2007    Past Surgical History:  Procedure Laterality Date  . ATRIAL FIBRILLATION ABLATION N/A 12/16/2019   Procedure: ATRIAL FIBRILLATION ABLATION;  Surgeon: Thompson Grayer, MD;  Location: [place] CV LAB;  Service: Cardiovascular;  Laterality: N/A;  . CARDIOVERSION N/A 05/02/2019   Procedure: CARDIOVERSION;  Surgeon: Pixie Casino, MD;  Location: [facility] ENDOSCOPY;  Service: Cardiovascular;  Laterality: N/A;  . CARDIOVERSION N/A 05/14/2019   Procedure: CARDIOVERSION;  Surgeon: [name], MD;  Location: [facility] ENDOSCOPY;  Service: Cardiovascular;  Laterality: N/A;  . CATARACT EXTRACTION,  BILATERAL    . CERVICAL DISCECTOMY  2005   with fusion  . CHOLECYSTECTOMY  2006  . COLONOSCOPY    . LUMBAR LAMINECTOMY/DECOMPRESSION MICRODISCECTOMY Right 11/13/2014   Procedure: Right Lumbar four-five Microdiskectomy;  Surgeon: Leeroy Cha, MD;  Location: [place] NEURO ORS;  Service: Neurosurgery;  Laterality: Right;  right  . POLYPECTOMY    . 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 TEE WITHOUT CARDIOVERSION N/A 05/02/2019   Procedure: TRANSESOPHAGEAL ECHOCARDIOGRAM (TEE);  Surgeon: Pixie Casino, MD;  Location: [place];  Service: Cardiovascular;  Laterality: N/A;  . TOTAL ABDOMINAL HYSTERECTOMY  1978  . TOTAL HIP ARTHROPLASTY Left 04/2011  . TRANSTHORACIC ECHOCARDIOGRAM  12/2008   EF=>55%, borderline conc LVH; trace MR; mod TR; trace AV regurg    Current Outpatient Medications  Medication Sig Dispense Refill  . b complex vitamins tablet Take 1 tablet by mouth daily. With vitamin c 500 mg    . baclofen (LIORESAL) 10 MG tablet Take 0.5 tablets by mouth as needed (as needed for migraine pain and neuropathy.).     Marland Kitchen Cholecalciferol 25 MCG (1000 UT) tablet Take 1,000 Units by mouth daily.     Marland Kitchen Dextran 70-Hypromellose (ARTIFICIAL TEARS PF OP) Place 1 drop into both eyes daily as needed (For dry eyes).    Marland Kitchen ELIQUIS 5 MG TABS tablet TAKE 1 TABLET BY MOUTH TWICE A DAY 180 tablet 1  . furosemide (LASIX) 20 MG tablet Take 20 mg by mouth daily as needed for edema (weight gain).    Marland Kitchen gabapentin (NEURONTIN) 300 MG capsule Take 300 mg by mouth at bedtime.     . Galcanezumab-gnlm (EMGALITY) 120 MG/ML SOAJ Inject 120 mg into the skin every 30 (thirty) days. 1.12 mL 11  . Galcanezumab-gnlm (EMGALITY) 120 MG/ML SOAJ Inject 240 mg into the skin once for 1 dose. 2 mL 0  . irbesartan (AVAPRO) 75 MG tablet Take 1 tablet (75 mg total) by mouth daily. 90 tablet 1  . levothyroxine (SYNTHROID, LEVOTHROID) 75 MCG tablet Take 75 mcg by mouth daily before breakfast.    . promethazine (PHENERGAN) 25 MG tablet  Take 25 mg by mouth every 6 (six) hours as needed for nausea.    . sertraline (ZOLOFT) 25 MG tablet Take 25 mg by mouth daily.    . traMADol (ULTRAM) 50 MG tablet Take 50-100 mg by mouth every 4 (four) hours as needed for moderate pain (as needed for migraine pain and neuropathy.).     Marland Kitchen Ubrogepant (UBRELVY) 100 MG TABS Take 100 mg by mouth every 2 (two) hours as needed. Maximum 200mg  a day. 16 tablet 11  . zinc gluconate 50 MG tablet Take 50 mg by mouth daily.    . rosuvastatin (CRESTOR) 20 MG tablet Take  1 tablet (20 mg total) by mouth daily at 6 PM. 90 tablet 3   No current facility-administered medications for this visit.    Allergies as of 06/01/2020 - Review Complete 06/01/2020  Allergen Reaction Noted  . Codeine Anaphylaxis 08/21/2007  . Sulfonamide derivatives Anaphylaxis 08/21/2007  . Triptans Other (See Comments) 07/30/2013  . Amlodipine Other (See Comments) 01/20/2013  . Chocolate Other (See Comments) 12/09/2019  . Onion Other (See Comments) 12/09/2019  . Other  08/21/2007    Vitals: BP 110/69 (BP Location: Left Arm, Patient Position: Sitting)   Pulse 70   Ht 5\' 7"  (1.702 m)   Wt 161 lb (73 kg)   LMP  (LMP Unknown)   BMI 25.22 kg/m  Last Weight:  Wt Readings from Last 1 Encounters:  06/01/20 161 lb (73 kg)   Last Height:   Ht Readings from Last 1 Encounters:  06/01/20 5\' 7"  (1.702 m)     Physical exam: Exam: Gen: NAD, flat affect                    CV: RRR, no MRG. No Carotid Bruits. No peripheral edema, warm, nontender Eyes: Conjunctivae clear without exudates or hemorrhage  Neuro: Detailed Neurologic Exam  Speech:    Speech is normal; fluent and spontaneous with normal comprehension.  Cognition:  MMSE - Mini Mental State Exam 06/01/2020  Orientation to time 3  Orientation to Place 4  Registration 3  Attention/ Calculation 0  Recall 3  Language- name 2 objects 2  Language- repeat 1  Language- follow 3 step command 3  Language- read & follow  direction 1  Write a sentence 1  Copy design 1  Total score 22   e Cranial Nerves: hypomimia    The pupils are equal, round, and reactive to light. The fundi are flat. Visual fields are full to finger confrontation. Extraocular movements are intact. Trigeminal sensation is intact and the muscles of mastication are normal. Asymmetry lids but no ptosis, otherwise symmetric face,  The palate elevates in the midline. Hearing intact. Voice is normal. Shoulder shrug is normal. The tongue has normal motion without fasciculations.   Coordination:    Normal finger to nose   Gait:    Imbalance, slightly ataxic  Motor Observation:    No asymmetry, no atrophy, and no involuntary movements noted. Tone:    Normal muscle tone.    Posture:    Posture is normal. normal erect    Strength:    Strength is V/V in the upper and lower limbs.      Sensation: intact to LT     Reflex Exam:  DTR's:    Absent AJs. Otherwise deep tendon reflexes in the upper and lower extremities are brisk bilaterally.   Toes:    The toes are equiv bilaterally.   Clonus:    Clonus is absent.    Assessment/Plan:   80 y.o. female here as requested by Thompson Grayer, MD for tremor in the head and memory complaints.  Past medical history degenerative disc disease, hyperlipidemia, hypertension, hypothyroidism, migraines, neuropathy. Patient has tried metoprolol 50mg , carvedilol 6.25mg , benazepril 10 mg, amlodipine 5mg , all for 2 months or more    Tremor head: May be essential tremor, sounds benign, did not observe today Ataxia: Needs MRI of the cervical spine for evaluation of cervical stenosis, she has abnormal reflexes and mild ataxia on gait, hx of chronic degenerative cervical changes Low pressure headache?: MRI of the brain and cervical spine, history  suggests possibly low-pressure headache, repeatedly and consistently positional, she has significant degenerative changes in the neck, csf leak? May consider a blood  patch or CT Myelogram. Memory loss: MRI brain. Blood work. MMSE 22/30 Migraines: Change Aimovig to Emgality(first dose 2 injections then once monthly thereafter). Start botox. Also consider sleep study (ask partner if snore and do you have a headache prior to waking or only after standing?)    Orders Placed This Encounter  Procedures  . MR BRAIN W WO CONTRAST  . MR CERVICAL SPINE W WO CONTRAST  . CBC with Differential/Platelets  . Comprehensive metabolic panel  . B12 and Folate Panel  . Methylmalonic acid, serum  . Vitamin B1  . Homocysteine  . Vitamin D, 25-hydroxy   Meds ordered this encounter  Medications  . Galcanezumab-gnlm (EMGALITY) 120 MG/ML SOAJ    Sig: Inject 120 mg into the skin every 30 (thirty) days.    Dispense:  1.12 mL    Refill:  11  . Galcanezumab-gnlm (EMGALITY) 120 MG/ML SOAJ    Sig: Inject 240 mg into the skin once for 1 dose.    Dispense:  2 mL    Refill:  0  . Ubrogepant (UBRELVY) 100 MG TABS    Sig: Take 100 mg by mouth every 2 (two) hours as needed. Maximum 200mg  a day.    Dispense:  16 tablet    Refill:  11    Cc: Thompson Grayer, MD,  Ronita Hipps, MD  Sarina Ill, MD  [facility] [address] [place], [place] [address]  Phone [phone] Fax [phone]  I spent over  115 minutes of face-to-face and non-face-to-face time with patient on the  1. Chronic intractable headache, unspecified headache type   2. Positional headache   3. Migraine without aura and without status migrainosus, not intractable   4. CSF leak   5. Ataxia   6. Chronic neck pain   7. Abnormal neurological exam   8. Memory loss    diagnosis.  This included previsit chart review, lab review, study review, order entry, electronic health record documentation, patient education on the different diagnostic and therapeutic options, counseling and coordination of care, risks and benefits of management, compliance, or risk factor  reduction

## 2020-06-01 NOTE — Telephone Encounter (Signed)
Start the botox approval. She can go to [place] (you can use her research spots thanks). g43.711

## 2020-06-01 NOTE — Telephone Encounter (Signed)
[employer] medic[employer] order sent to [facility]. They will obtain the auth and reach out to the patient to schedule.  °

## 2020-06-01 NOTE — Patient Instructions (Addendum)
MRI brain w/wo contrast and MRI cervical spine w/wo contrast Blood work Change Aimovig to Emgality(first dose 2 injections then once monthly thereafter) Consider botox Also consider sleep study (ask partner if snore and do you have a headache prior to getting up?) Consider Low Pressure headaches (CSF LEAK? Positional quality?) Ubrelvy: Please take one tablet at the onset of your headache. If it does not improve the symptoms please take one additional tablet. Do not take more then 2 tablets in 24hrs. Do not take use more then 2 to 3 times in a week.  Sleep Apnea Sleep apnea is a condition in which breathing pauses or becomes shallow during sleep. Episodes of sleep apnea usually last 10 seconds or longer, and they may occur as many as 20 times an hour. Sleep apnea disrupts your sleep and keeps your body from getting the rest that it needs. This condition can increase your risk of certain health problems, including:  Heart attack.  Stroke.  Obesity.  Diabetes.  Heart failure.  Irregular heartbeat. What are the causes? There are three kinds of sleep apnea:  Obstructive sleep apnea. This kind is caused by a blocked or collapsed airway.  Central sleep apnea. This kind happens when the part of the brain that controls breathing does not send the correct signals to the muscles that control breathing.  Mixed sleep apnea. This is a combination of obstructive and central sleep apnea. The most common cause of this condition is a collapsed or blocked airway. An airway can collapse or become blocked if:  Your throat muscles are abnormally relaxed.  Your tongue and tonsils are larger than normal.  You are overweight.  Your airway is smaller than normal.   What increases the risk? You are more likely to develop this condition if you:  Are overweight.  Smoke.  Have a smaller than normal airway.  Are elderly.  Are female.  Drink alcohol.  Take sedatives or tranquilizers.  Have a  family history of sleep apnea. What are the signs or symptoms? Symptoms of this condition include:  Trouble staying asleep.  Daytime sleepiness and tiredness.  Irritability.  Loud snoring.  Morning headaches.  Trouble concentrating.  Forgetfulness.  Decreased interest in sex.  Unexplained sleepiness.  Mood swings.  Personality changes.  Feelings of depression.  Waking up often during the night to urinate.  Dry mouth.  Sore throat. How is this diagnosed? This condition may be diagnosed with:  A medical history.  A physical exam.  A series of tests that are done while you are sleeping (sleep study). These tests are usually done in a sleep lab, but they may also be done at home. How is this treated? Treatment for this condition aims to restore normal breathing and to ease symptoms during sleep. It may involve managing health issues that can affect breathing, such as high blood pressure or obesity. Treatment may include:  Sleeping on your side.  Using a decongestant if you have nasal congestion.  Avoiding the use of depressants, including alcohol, sedatives, and narcotics.  Losing weight if you are overweight.  Making changes to your diet.  Quitting smoking.  Using a device to open your airway while you sleep, such as: ? An oral appliance. This is a custom-made mouthpiece that shifts your lower jaw forward. ? A continuous positive airway pressure (CPAP) device. This device blows air through a mask when you breathe out (exhale). ? A nasal expiratory positive airway pressure (EPAP) device. This device has valves that you  put into each nostril. ? A bi-level positive airway pressure (BPAP) device. This device blows air through a mask when you breathe in (inhale) and breathe out (exhale).  Having surgery if other treatments do not work. During surgery, excess tissue is removed to create a wider airway. It is important to get treatment for sleep apnea. Without  treatment, this condition can lead to:  High blood pressure.  Coronary artery disease.  In men, an inability to achieve or maintain an erection (impotence).  Reduced thinking abilities.   Follow these instructions at home: Lifestyle  Make any lifestyle changes that your health care provider recommends.  Eat a healthy, well-balanced diet.  Take steps to lose weight if you are overweight.  Avoid using depressants, including alcohol, sedatives, and narcotics.  Do not use any products that contain nicotine or tobacco, such as cigarettes, e-cigarettes, and chewing tobacco. If you need help quitting, ask your health care provider. General instructions  Take over-the-counter and prescription medicines only as told by your health care provider.  If you were given a device to open your airway while you sleep, use it only as told by your health care provider.  If you are having surgery, make sure to tell your health care provider you have sleep apnea. You may need to bring your device with you.  Keep all follow-up visits as told by your health care provider. This is important. Contact a health care provider if:  The device that you received to open your airway during sleep is uncomfortable or does not seem to be working.  Your symptoms do not improve.  Your symptoms get worse. Get help right away if:  You develop: ? Chest pain. ? Shortness of breath. ? Discomfort in your back, arms, or stomach.  You have: ? Trouble speaking. ? Weakness on one side of your body. ? Drooping in your face. These symptoms may represent a serious problem that is an emergency. Do not wait to see if the symptoms will go away. Get medical help right away. Call your local emergency services (911 in the [place] Do not drive yourself to the hospital. Summary  Sleep apnea is a condition in which breathing pauses or becomes shallow during sleep.  The most common cause is a collapsed or blocked  airway.  The goal of treatment is to restore normal breathing and to ease symptoms during sleep. This information is not intended to replace advice given to you by your health care provider. Make sure you discuss any questions you have with your health care provider. Document Revised: 08/21/2018 Document Reviewed: 10/30/2017 Elsevier Patient Education  2021 [place]. OnabotulinumtoxinA injection (Medical Use) What is this medicine? ONABOTULINUMTOXINA (o na BOTT you lye num tox in eh) is a neuro-muscular blocker. This medicine is used to treat crossed eyes, eyelid spasms, severe neck muscle spasms, ankle and toe muscle spasms, and elbow, wrist, and finger muscle spasms. It is also used to treat excessive underarm sweating, to prevent chronic migraine headaches, and to treat loss of bladder control due to neurologic conditions such as multiple sclerosis or spinal cord injury. This medicine may be used for other purposes; ask your health care provider or pharmacist if you have questions. COMMON BRAND NAME(S): Botox What should I tell my health care provider before I take this medicine? They need to know if you have any of these conditions:  breathing problems  cerebral palsy spasms  difficulty urinating  heart problems  history of surgery where this medicine  is going to be used  infection at the site where this medicine is going to be used  myasthenia gravis or other neurologic disease  nerve or muscle disease  surgery plans  take medicines that treat or prevent blood clots  thyroid problems  an unusual or allergic reaction to botulinum toxin, albumin, other medicines, foods, dyes, or preservatives  pregnant or trying to get pregnant  breast-feeding How should I use this medicine? This medicine is for injection into a muscle. It is given by a health care professional in a hospital or clinic setting. Talk to your pediatrician regarding the use of this medicine in children.  While this drug may be prescribed for children as young as 28 years old for selected conditions, precautions do apply. Overdosage: If you think you have taken too much of this medicine contact a poison control center or emergency room at once. NOTE: This medicine is only for you. Do not share this medicine with others. What if I miss a dose? This does not apply. What may interact with this medicine?  aminoglycoside antibiotics like gentamicin, neomycin, tobramycin  muscle relaxants  other botulinum toxin injections This list may not describe all possible interactions. Give your health care provider a list of all the medicines, herbs, non-prescription drugs, or dietary supplements you use. Also tell them if you smoke, drink alcohol, or use illegal drugs. Some items may interact with your medicine. What should I watch for while using this medicine? Visit your doctor for regular check ups. This medicine will cause weakness in the muscle where it is injected. Tell your doctor if you feel unusually weak in other muscles. Get medical help right away if you have problems with breathing, swallowing, or talking. This medicine might make your eyelids droop or make you see blurry or double. If you have weak muscles or trouble seeing do not drive a car, use machinery, or do other dangerous activities. This medicine contains albumin from human blood. It may be possible to pass an infection in this medicine, but no cases have been reported. Talk to your doctor about the risks and benefits of this medicine. If your activities have been limited by your condition, go back to your regular routine slowly after treatment with this medicine. What side effects may I notice from receiving this medicine? Side effects that you should report to your doctor or health care professional as soon as possible:  allergic reactions like skin rash, itching or hives, swelling of the face, lips, or tongue  breathing  problems  changes in vision  chest pain or tightness  eye irritation, pain  fast, irregular heartbeat  infection  numbness  speech problems  swallowing problems  unusual weakness Side effects that usually do not require medical attention (report to your doctor or health care professional if they continue or are bothersome):  bruising or pain at site where injected  drooping eyelid  dry eyes or mouth  headache  muscles aches, pains  sensitivity to light  tearing This list may not describe all possible side effects. Call your doctor for medical advice about side effects. You may report side effects to FDA at 1-800-FDA-1088. Where should I keep my medicine? This drug is given in a hospital or clinic and will not be stored at home. NOTE: This sheet is a summary. It may not cover all possible information. If you have questions about this medicine, talk to your doctor, pharmacist, or health care provider.  2021 Elsevier/Gold Standard (2017-09-10 14:21:42)  Galcanezumab injection What is this medicine? GALCANEZUMAB (gal ka NEZ ue mab) is used to prevent migraines and treat cluster headaches. This medicine may be used for other purposes; ask your health care provider or pharmacist if you have questions. COMMON BRAND NAME(S): Emgality What should I tell my health care provider before I take this medicine? They need to know if you have any of these conditions:  an unusual or allergic reaction to galcanezumab, other medicines, foods, dyes, or preservatives  pregnant or trying to get pregnant  breast-feeding How should I use this medicine? This medicine is for injection under the skin. You will be taught how to prepare and give this medicine. Use exactly as directed. Take your medicine at regular intervals. Do not take your medicine more often than directed. It is important that you put your used needles and syringes in a special sharps container. Do not put them in a trash  can. If you do not have a sharps container, call your pharmacist or healthcare provider to get one. Talk to your pediatrician regarding the use of this medicine in children. Special care may be needed. Overdosage: If you think you have taken too much of this medicine contact a poison control center or emergency room at once. NOTE: This medicine is only for you. Do not share this medicine with others. What if I miss a dose? If you miss a dose, take it as soon as you can. If it is almost time for your next dose, take only that dose. Do not take double or extra doses. What may interact with this medicine? Interactions are not expected. This list may not describe all possible interactions. Give your health care provider a list of all the medicines, herbs, non-prescription drugs, or dietary supplements you use. Also tell them if you smoke, drink alcohol, or use illegal drugs. Some items may interact with your medicine. What should I watch for while using this medicine? Tell your doctor or healthcare professional if your symptoms do not start to get better or if they get worse. What side effects may I notice from receiving this medicine? Side effects that you should report to your doctor or health care professional as soon as possible:  allergic reactions like skin rash, itching or hives, swelling of the face, lips, or tongue Side effects that usually do not require medical attention (report these to your doctor or health care professional if they continue or are bothersome):  pain, redness, or irritation at site where injected This list may not describe all possible side effects. Call your doctor for medical advice about side effects. You may report side effects to FDA at 1-800-FDA-1088. Where should I keep my medicine? Keep out of the reach of children. You will be instructed on how to store this medicine. Throw away any unused medicine after the expiration date on the label. NOTE: This sheet is a  summary. It may not cover all possible information. If you have questions about this medicine, talk to your doctor, pharmacist, or health care provider.  2021 Elsevier/Gold Standard (2017-08-22 12:03:23)

## 2020-06-01 NOTE — Telephone Encounter (Signed)
Botox charge sheet completed and is pending MD signature. Pt needs to sign consent at first injection appt.

## 2020-06-01 NOTE — Telephone Encounter (Signed)
Botox charge sheet signed by Dr Jaynee Eagles and given to [facility].

## 2020-06-02 NOTE — Telephone Encounter (Signed)
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 I will fill out the Botox PA form for [facility].

## 2020-06-03 ENCOUNTER — Other Ambulatory Visit: Payer: Self-pay

## 2020-06-03 ENCOUNTER — Ambulatory Visit
Admission: RE | Admit: 2020-06-03 | Discharge: 2020-06-03 | Disposition: A | Discharge status: Home / Self Care | Payer: Medicare HMO | Source: Ambulatory Visit | Attending: Neurology | Admitting: Neurology

## 2020-06-03 ENCOUNTER — Other Ambulatory Visit: Payer: Self-pay | Admitting: Internal Medicine

## 2020-06-03 DIAGNOSIS — G8929 Other chronic pain: Secondary | ICD-10-CM

## 2020-06-03 DIAGNOSIS — R413 Other amnesia: Secondary | ICD-10-CM | POA: Diagnosis not present

## 2020-06-03 DIAGNOSIS — M542 Cervicalgia: Secondary | ICD-10-CM

## 2020-06-03 DIAGNOSIS — R519 Headache, unspecified: Secondary | ICD-10-CM

## 2020-06-03 DIAGNOSIS — R270 Ataxia, unspecified: Secondary | ICD-10-CM

## 2020-06-03 DIAGNOSIS — R51 Headache with orthostatic component, not elsewhere classified: Secondary | ICD-10-CM

## 2020-06-03 DIAGNOSIS — R2990 Unspecified symptoms and signs involving the nervous system: Secondary | ICD-10-CM

## 2020-06-03 DIAGNOSIS — G9600 Cerebrospinal fluid leak, unspecified: Secondary | ICD-10-CM

## 2020-06-03 MED ORDER — GADOBENATE DIMEGLUMINE 529 MG/ML IV SOLN
15.0000 mL | Freq: Once | INTRAVENOUS | Status: AC | PRN
  Administered 2020-06-03: 15 mL via INTRAVENOUS

## 2020-06-07 ENCOUNTER — Telehealth: Payer: Self-pay | Admitting: Neurology

## 2020-06-07 DIAGNOSIS — G43009 Migraine without aura, not intractable, without status migrainosus: Secondary | ICD-10-CM

## 2020-06-07 MED ORDER — UBRELVY 100 MG PO TABS: 100.0000 mg | ORAL_TABLET | ORAL | 11 refills | Status: DC | PRN

## 2020-06-07 NOTE — Telephone Encounter (Signed)
Pt. states that pharmacy does not have pres. for Ubrogepant (UBRELVY) 100 MG TABS. She asks that she be called by RN.

## 2020-06-07 NOTE — Telephone Encounter (Signed)
Rx w/ 11 refills went through as sample. I have fixed this on our end and it went to the pharmacy. I called the pt and let her know. She verbalized appreciation.

## 2020-06-07 NOTE — Telephone Encounter (Signed)
[employer] PA form with notes from 3/15 appointment.

## 2020-06-07 NOTE — Addendum Note (Signed)
Addended by: Gildardo Griffes on: 06/07/2020 04:33 PM   Modules accepted: Orders

## 2020-06-08 NOTE — Telephone Encounter (Signed)
I called patient and scheduled her first Botox appointment with [facility] for 3/24.

## 2020-06-08 NOTE — Telephone Encounter (Signed)
Received approval from [facility]. PA #[id number] (06/07/20- 06/07/21).

## 2020-06-10 ENCOUNTER — Ambulatory Visit: Payer: Medicare HMO | Admitting: Adult Health

## 2020-06-10 NOTE — Progress Notes (Deleted)
Botox- 200 units x 2 vials Lot: [id number] Expiration: 07/2022 NDC: 3428-7681-15   Bacteriostatic 0.9% Sodium Chloride- 68mL total [id number] Expiration: 02/23 NDC: 41638-453-64   B/B

## 2020-06-11 ENCOUNTER — Other Ambulatory Visit: Payer: Self-pay

## 2020-06-11 MED ORDER — ROSUVASTATIN CALCIUM 20 MG PO TABS: 20.0000 mg | ORAL_TABLET | Freq: Every day | ORAL | 1 refills | Status: DC

## 2020-06-11 NOTE — Telephone Encounter (Signed)
Pt's husband, Naiara Lombardozzi (on [place]) called, the pharmacy told us to call the physician and have to physician to speak with the insurance about the prescription for Ubrogepant (UBRELVY) 100 MG TABS. Would like a call from the nurse.

## 2020-06-12 LAB — B12 AND FOLATE PANEL
Folate: 11.2 ng/mL (ref >3.0)
Vitamin B-12: 505 pg/mL (ref 232–1245)

## 2020-06-12 LAB — CBC WITH DIFFERENTIAL/PLATELET
Basophils Absolute: 0 10*3/uL (ref 0.0–0.2)
Basos: 1 %
EOS (ABSOLUTE): 0.2 10*3/uL (ref 0.0–0.4)
Eos: 3 %
Hematocrit: 41.2 % (ref 34.0–46.6)
Hemoglobin: 13.2 g/dL (ref 11.1–15.9)
Immature Grans (Abs): 0 10*3/uL (ref 0.0–0.1)
Immature Granulocytes: 0 %
Lymphocytes Absolute: 1.3 10*3/uL (ref 0.7–3.1)
Lymphs: 23 %
MCH: 29.7 pg (ref 26.6–33.0)
MCHC: 32 g/dL (ref 31.5–35.7)
MCV: 93 fL (ref 79–97)
Monocytes Absolute: 0.5 10*3/uL (ref 0.1–0.9)
Monocytes: 8 %
Neutrophils Absolute: 3.9 10*3/uL (ref 1.4–7.0)
Neutrophils: 65 %
Platelets: 144 10*3/uL — ABNORMAL LOW (ref 150–450)
RBC: 4.45 x10E6/uL (ref 3.77–5.28)
RDW: 13.1 % (ref 11.7–15.4)
WBC: 5.9 10*3/uL (ref 3.4–10.8)

## 2020-06-12 LAB — COMPREHENSIVE METABOLIC PANEL
ALT: 16 IU/L (ref 0–32)
AST: 21 IU/L (ref 0–40)
Albumin/Globulin Ratio: 1.7 (ref 1.2–2.2)
Albumin: 4 g/dL (ref 3.7–4.7)
Alkaline Phosphatase: 61 IU/L (ref 44–121)
BUN/Creatinine Ratio: 15 (ref 12–28)
BUN: 23 mg/dL (ref 8–27)
Bilirubin Total: 0.4 mg/dL (ref 0.0–1.2)
CO2: 23 mmol/L (ref 20–29)
Calcium: 10 mg/dL (ref 8.7–10.3)
Chloride: 108 mmol/L — ABNORMAL HIGH (ref 96–106)
Creatinine, Ser: 1.5 mg/dL — ABNORMAL HIGH (ref 0.57–1.00)
Globulin, Total: 2.4 g/dL (ref 1.5–4.5)
Glucose: 141 mg/dL — ABNORMAL HIGH (ref 65–99)
Potassium: 4.4 mmol/L (ref 3.5–5.2)
Sodium: 146 mmol/L — ABNORMAL HIGH (ref 134–144)
Total Protein: 6.4 g/dL (ref 6.0–8.5)
eGFR: 35 mL/min/{1.73_m2} — ABNORMAL LOW (ref >59)

## 2020-06-12 LAB — HOMOCYSTEINE: Homocysteine: 11.5 umol/L (ref 0.0–19.2)

## 2020-06-12 LAB — VITAMIN B1: Thiamine: 168 nmol/L (ref 66.5–200.0)

## 2020-06-12 LAB — VITAMIN D 25 HYDROXY (VIT D DEFICIENCY, FRACTURES): Vit D, 25-Hydroxy: 28.5 ng/mL — ABNORMAL LOW (ref 30.0–100.0)

## 2020-06-12 LAB — METHYLMALONIC ACID, SERUM: Methylmalonic Acid: 1051 nmol/L — ABNORMAL HIGH (ref 0–378)

## 2020-06-14 DIAGNOSIS — R413 Other amnesia: Secondary | ICD-10-CM | POA: Diagnosis not present

## 2020-06-14 DIAGNOSIS — I4891 Unspecified atrial fibrillation: Secondary | ICD-10-CM | POA: Diagnosis not present

## 2020-06-14 DIAGNOSIS — Z6824 Body mass index (BMI) 24.0-24.9, adult: Secondary | ICD-10-CM | POA: Diagnosis not present

## 2020-06-14 NOTE — Telephone Encounter (Signed)
PA completed through [place].  KEY: [id number] May take 1-3 days before receiving a response

## 2020-06-14 NOTE — Telephone Encounter (Signed)
Called patient to make her aware that the prior authorization for the medication have been completed.  We will wait to hear from the insurance.  Patient verbalized understanding

## 2020-06-14 NOTE — Telephone Encounter (Signed)
PA approved for the pt 03/20/20-03/19/21 through [place]

## 2020-06-21 ENCOUNTER — Ambulatory Visit: Payer: Medicare HMO | Admitting: Internal Medicine

## 2020-06-21 DIAGNOSIS — I4819 Other persistent atrial fibrillation: Secondary | ICD-10-CM

## 2020-06-21 DIAGNOSIS — I483 Typical atrial flutter: Secondary | ICD-10-CM

## 2020-06-21 DIAGNOSIS — I519 Heart disease, unspecified: Secondary | ICD-10-CM

## 2020-07-07 DIAGNOSIS — Z01419 Encounter for gynecological examination (general) (routine) without abnormal findings: Secondary | ICD-10-CM | POA: Diagnosis not present

## 2020-07-07 DIAGNOSIS — N390 Urinary tract infection, site not specified: Secondary | ICD-10-CM | POA: Diagnosis not present

## 2020-07-07 DIAGNOSIS — R319 Hematuria, unspecified: Secondary | ICD-10-CM | POA: Diagnosis not present

## 2020-07-07 DIAGNOSIS — Z6825 Body mass index (BMI) 25.0-25.9, adult: Secondary | ICD-10-CM | POA: Diagnosis not present

## 2020-07-07 DIAGNOSIS — Z1231 Encounter for screening mammogram for malignant neoplasm of breast: Secondary | ICD-10-CM | POA: Diagnosis not present

## 2020-07-17 DIAGNOSIS — E782 Mixed hyperlipidemia: Secondary | ICD-10-CM | POA: Diagnosis not present

## 2020-07-17 DIAGNOSIS — I10 Essential (primary) hypertension: Secondary | ICD-10-CM | POA: Diagnosis not present

## 2020-07-17 DIAGNOSIS — E039 Hypothyroidism, unspecified: Secondary | ICD-10-CM | POA: Diagnosis not present

## 2020-07-20 ENCOUNTER — Ambulatory Visit: Payer: Medicare HMO | Admitting: Neurology

## 2020-07-21 ENCOUNTER — Telehealth: Payer: Self-pay | Admitting: Neurology

## 2020-07-21 ENCOUNTER — Ambulatory Visit: Payer: Medicare HMO | Admitting: Neurology

## 2020-07-21 NOTE — Telephone Encounter (Signed)
Emgality PA completed on Cover My Meds. Key: [id number]. Awaiting determination from [facility].

## 2020-07-21 NOTE — Telephone Encounter (Signed)
Appt for today canceled. It doesn't appear that we ever received a PA for the [facility]. Will work on this asap.

## 2020-07-21 NOTE — Telephone Encounter (Signed)
I spoke to patient, workup has been negative and she has not even had her first botox yet so we are going to reschedule her appointment to a later time.   Bethany: would you check and see what happened with the Emgality(she said she didn't get it).Bethany cancel and I will ask the front staff or Lovena Le to reschedule her to mid October she gets a few injections in before we check up on her thanks.  Lovena Le: does megan have any sooner botox appointments (can use a research spot).

## 2020-07-21 NOTE — Progress Notes (Deleted)
[facility]    Provider:  Dr Jaynee Eagles Requesting Provider: Ronita Hipps, MD Primary Care Provider:  Ronita Hipps, MD  CC:  Tremor in the head and memory  Patient had her first botox appointment 3/24. She was prescribed Roselyn Meier and Emgality was started at last appointment. MRI of the brain and cervical spine were unremarkable.   HPI:  Bridget [name] is a 80 y.o. female here as requested by Ronita Hipps, MD for tremor in the head and memory complaints.  Past medical history degenerative disc disease, hyperlipidemia, hypertension, hypothyroidism, migraines, neuropathy.  Patient has atrial fibrillation and follows with Dr. [place] Grayer, per Dr. Jackalyn Lombard notes: since his recent ablation patient reports doing very well, no procedure related complications, over the past few months has had symptoms of head tremor, confusion and difficulty with thought processing, she has been evaluated by primary care and thyroid medicine adjusted, no A. fib events, the patient is otherwise without complaint.  Dr. Rayann Heman stopped her amiodarone and Coreg, she has been started on Zoloft for concerns of depression.  Patient here with daughter. She has had migraines since the age of 57. Baclofen has helped the tremor. Tremor started at least back in October even before then. Daughter can see it. No family hx of tremors. Tremor is better with stopping the amiodarone especially in the hands, It was more with holding things.   She has migraines. They are unilateral, in the back of the head, Can be unilateral. It is pulsating, pounding throbbing, vomiting, light sensitivity, she has a migraine every day, better laying down and starts when she gets up in the morning, gets better with laying down, she takes tramadol (understands about medication rebound). She cannot take imitrex she had to call the ambulance, she tried botox once, no snoring, doesn't think the headache is there apon waking. Daily headaches and  migraines, start after she gets up in the morning, positional, no aura, no medication overuse.  We had a long discussion, spent over 70 minutes with patient and daughter, we should follow up on the above and ensure no other causes of her intractable headaches. Spent further time reviewing chart and Images.    Patient has tried metoprolol 50mg , carvedilol 6.25mg , benazepril 10 mg, amlodipine 5mg , all for 2 months or more    Reviewed notes, labs and imaging from outside physicians, which showed:    CT Head wo contrast 06/13/2012: reviewed report * The brain parenchyma is unremarkable for the patient's age.  * No evidence of hydrocephalus.  * No intracranial mass, mass effect or midline shift.  * No acute infarction evident.  * No intracranial hemorrhage.  * Paranasal sinuses: Clear.  * Mastoid air cells: Clear.  * Calvarium: Intact.   IMPRESSION:  No acute intracranial abnormality.   CT cervical spine 2004:  FINDINGS  CLINICAL DATA: NECK PAIN.  CERVICAL MYELOGRAM  THERE IS NORMAL ALIGNMENT. THERE IS DISC DEGENERATION AND SPONDYLOSIS AT C3-4, C4-5, C5-6, AND AT  C6-7. THERE IS SUBOPTIMAL OPACIFICATION OF THE CERVICAL SPINE. SEE CT RESULTS BELOW.  IMPRESSION  CERVICAL SPONDYLOSIS.  CT MYELOGRAM OF THE CERVICAL SPINE  C2-3: NEGATIVE.  C3-4: THERE IS MILD TO MODERATE SPONDYLOSIS WITH DIFFUSE UNCOVERTEBRAL SPURRING AND FACET  ARTHROPATHY, PARTICULARLY ON THE RIGHT. THERE IS RIGHT FORAMINAL ENCROACHMENT AND MILD FLATTENING  OF THE CORD ON THE RIGHT.  C4-5: DISC DEGENERATION WITH SPONDYLOSIS. THERE IS UNCOVERTEBRAL SPURRING, RIGHT GREATER THAN  LEFT WITHOUT CORD DEFORMITY. THERE IS MILD RIGHT FORAMINAL ENCROACHMENT.  C5-6: THERE IS MODERATE UNCOVERTEBRAL SPURRING BILATERALLY WITH A SUPERIMPOSED FOCAL SPUR ON THE  LEFT SIDE. THERE IS FLATTENING OF THE CORD BILATERALLY DUE TO MODERATE SPINAL STENOSIS. THE  NEURAL FORAMINA ARE NARROWED BILATERALLY.  C6-7: THERE IS  RIGHT FORAMINAL ENCROACHMENT DUE TO UNCOVERTEBRAL SPURRING. THERE IS NO  SIGNIFICANT SPINAL STENOSIS.  C7-T1: NEGATIVE.  T1-2: SMALL LEFT-SIDED SPUR WITHOUT CORD DEFORMITY.  IMPRESSION  CERVICAL SPONDYLOSIS C3-4, C4-5, C5-6, AND C6-7. THERE IS MILD TO MODERATE SPINAL STENOSIS AND  FLATTENING OF THE CORD AT C5-6.  MULTIPLANAR RECONSTRUCTION OF THE CERVICAL SPINE WITH INTRATHECAL CONTRAST  SAGITTAL RECONSTRUCTIONS REVEAL NORMAL ALIGNMENT. DISC DEGENERATION AND SPONDYLOSIS ARE NOTED AT  C3-4, C4-5, C5-6, AND C6-7 AS DESCRIBED ABOVE. THERE IS NO FRACTURE. SPONDYLOSIS IS MOST SEVERE  AT C5-6.  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 Review of Systems: Patient complains of symptoms per HPI as well as the following symptoms: imbalance, depression. Pertinent negatives and positives per HPI. All others negative.   Social History   Socioeconomic History  . Marital status: Married    Spouse name: Not on file  . Number of children: 2  . Years of education: 56  . Highest education level: Not on file  Occupational History  . Occupation: retired    Comment: medical records admin  Tobacco Use  . Smoking status: Never Smoker  . Smokeless tobacco: Never Used  Vaping Use  . Vaping Use: Never used  Substance and Sexual Activity  . Alcohol use: No  . Drug use: No  . Sexual activity: Not on file  Other Topics Concern  . Not on file  Social History Narrative   Lives at home with husband   Right handed   Caffeine: maybe 1 cup/day   Social Determinants of Health   Financial Resource Strain: Not on file  Food Insecurity: Not on file  Transportation Needs: Not on file  Physical Activity: Not on file  Stress: Not on file  Social Connections: Not on file  Intimate Partner Violence: Not on file    Family History  Problem Relation Age of Onset  . Stroke Mother 14  . Coronary artery disease Father 48  . Colon polyps Father   . Colon cancer Cousin   .  Esophageal cancer Cousin   . Stroke Maternal Grandfather   . Stroke Paternal Grandmother   . Breast cancer Paternal Grandmother   . Suicidality Daughter   . Rectal cancer Neg Hx   . Stomach cancer Neg Hx   . Tremor Neg Hx   . Parkinson's disease Neg Hx   . Alzheimer's disease Neg Hx   . Dementia Neg Hx     Past Medical History:  Diagnosis Date  . Allergy   . Arthritis    hip OA  . Degenerative disc disease   . GERD (gastroesophageal reflux disease)   . Hyperlipidemia   . Hypertension   . Hypothyroidism   . Migraines   . Neuromuscular disorder ([place])   . Neuropathy   . Seasonal allergies   . 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 Persistent atrial fibrillation ([place]) 07/10/2019  . Acute systolic (congestive) heart failure ([place]) 05/13/2019  . Atrial fibrillation with RVR ([place]) 05/12/2019  . Atrial flutter ([place])   . Back pain 04/18/2015  . Lumbar herniated disc 11/13/2014  . Peripheral polyneuropathy 07/30/2013  . Migraine headache 07/30/2013  . Chronic neck pain  07/10/2013  . ADENOMATOUS COLONIC POLYP 11/12/2008  . Hypothyroidism 08/22/2007  . Lumbar radiculopathy 08/22/2007  . Disorder of joint 08/22/2007  . Arthropathia 08/22/2007  . Hyperlipidemia 08/21/2007  . Essential hypertension 08/21/2007  . Gastroesophageal reflux disease 08/21/2007  . Diverticulosis of large intestine 08/21/2007  . Headache 08/21/2007  . History of colonic polyps 08/21/2007  . History of disease 08/21/2007    Past Surgical History:  Procedure Laterality Date  . ATRIAL FIBRILLATION ABLATION N/A 12/16/2019   Procedure: ATRIAL FIBRILLATION ABLATION;  Surgeon: [place] Grayer, MD;  Location: [facility];  Service: Cardiovascular;  Laterality: N/A;  . CARDIOVERSION N/A 05/02/2019   Procedure: CARDIOVERSION;  Surgeon: Pixie Casino, MD;  Location: [facility] ENDOSCOPY;  Service: Cardiovascular;  Laterality: N/A;  . CARDIOVERSION  N/A 05/14/2019   Procedure: CARDIOVERSION;  Surgeon: [name], MD;  Location: [facility] ENDOSCOPY;  Service: Cardiovascular;  Laterality: N/A;  . CATARACT EXTRACTION, BILATERAL    . CERVICAL DISCECTOMY  2005   with fusion  . CHOLECYSTECTOMY  2006  . COLONOSCOPY    . LUMBAR LAMINECTOMY/DECOMPRESSION MICRODISCECTOMY Right 11/13/2014   Procedure: Right Lumbar four-five Microdiskectomy;  Surgeon: Leeroy Cha, MD;  Location: [facility];  Service: Neurosurgery;  Laterality: Right;  right  . POLYPECTOMY    . 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 TEE WITHOUT CARDIOVERSION N/A 05/02/2019   Procedure: TRANSESOPHAGEAL ECHOCARDIOGRAM (TEE);  Surgeon: Pixie Casino, MD;  Location: [facility];  Service: Cardiovascular;  Laterality: N/A;  . TOTAL ABDOMINAL HYSTERECTOMY  1978  . TOTAL HIP ARTHROPLASTY Left 04/2011  . TRANSTHORACIC ECHOCARDIOGRAM  12/2008   EF=>55%, borderline conc LVH; trace MR; mod TR; trace AV regurg    Current Outpatient Medications  Medication Sig Dispense Refill  . b complex vitamins tablet Take 1 tablet by mouth daily. With vitamin c 500 mg    . baclofen (LIORESAL) 10 MG tablet Take 0.5 tablets by mouth as needed (as needed for migraine pain and neuropathy.).     Marland Kitchen Cholecalciferol 25 MCG (1000 UT) tablet Take 1,000 Units by mouth daily.     Marland Kitchen Dextran 70-Hypromellose (ARTIFICIAL TEARS PF OP) Place 1 drop into both eyes daily as needed (For dry eyes).    Marland Kitchen ELIQUIS 5 MG TABS tablet TAKE 1 TABLET BY MOUTH TWICE A DAY 180 tablet 1  . furosemide (LASIX) 20 MG tablet Take 20 mg by mouth daily as needed for edema (weight gain).    Marland Kitchen gabapentin (NEURONTIN) 300 MG capsule Take 300 mg by mouth at bedtime.     . Galcanezumab-gnlm (EMGALITY) 120 MG/ML SOAJ Inject 120 mg into the skin every 30 (thirty) days. 1.12 mL 11  . irbesartan (AVAPRO) 75 MG tablet Take 1 tablet (75 mg total) by mouth daily. 90 tablet 1  . levothyroxine (SYNTHROID, LEVOTHROID) 75 MCG tablet Take 75 mcg by mouth  daily before breakfast.    . promethazine (PHENERGAN) 25 MG tablet Take 25 mg by mouth every 6 (six) hours as needed for nausea.    . rosuvastatin (CRESTOR) 20 MG tablet Take 1 tablet (20 mg total) by mouth daily at 6 PM. 90 tablet 1  . sertraline (ZOLOFT) 25 MG tablet Take 25 mg by mouth daily.    . traMADol (ULTRAM) 50 MG tablet Take 50-100 mg by mouth every 4 (four) hours as needed for moderate pain (as needed for migraine pain and neuropathy.).     Marland Kitchen Ubrogepant (UBRELVY) 100 MG TABS Take 100 mg by mouth every 2 (  two) hours as needed (migraine.). Maximum 200mg  a day. 16 tablet 11  . zinc gluconate 50 MG tablet Take 50 mg by mouth daily.     No current facility-administered medications for this visit.    Allergies as of 07/21/2020 - Review Complete 06/01/2020  Allergen Reaction Noted  . Codeine Anaphylaxis 08/21/2007  . Sulfonamide derivatives Anaphylaxis 08/21/2007  . Triptans Other (See Comments) 07/30/2013  . Amlodipine Other (See Comments) 01/20/2013  . Chocolate Other (See Comments) 12/09/2019  . Onion Other (See Comments) 12/09/2019  . Other  08/21/2007    Vitals: LMP  (LMP Unknown)  Last Weight:  Wt Readings from Last 1 Encounters:  06/01/20 161 lb (73 kg)   Last Height:   Ht Readings from Last 1 Encounters:  06/01/20 5\' 7"  (1.702 m)     Physical exam: Exam: Gen: NAD, flat affect                    CV: RRR, no MRG. No Carotid Bruits. No peripheral edema, warm, nontender Eyes: Conjunctivae clear without exudates or hemorrhage  Neuro: Detailed Neurologic Exam  Speech:    Speech is normal; fluent and spontaneous with normal comprehension.  Cognition:  MMSE - Mini Mental State Exam 06/01/2020  Orientation to time 3  Orientation to Place 4  Registration 3  Attention/ Calculation 0  Recall 3  Language- name 2 objects 2  Language- repeat 1  Language- follow 3 step command 3  Language- read & follow direction 1  Write a sentence 1  Copy design 1  Total score  22   e Cranial Nerves: hypomimia    The pupils are equal, round, and reactive to light. The fundi are flat. Visual fields are full to finger confrontation. Extraocular movements are intact. Trigeminal sensation is intact and the muscles of mastication are normal. Asymmetry lids but no ptosis, otherwise symmetric face,  The palate elevates in the midline. Hearing intact. Voice is normal. Shoulder shrug is normal. The tongue has normal motion without fasciculations.   Coordination:    Normal finger to nose   Gait:    Imbalance, slightly ataxic  Motor Observation:    No asymmetry, no atrophy, and no involuntary movements noted. Tone:    Normal muscle tone.    Posture:    Posture is normal. normal erect    Strength:    Strength is V/V in the upper and lower limbs.      Sensation: intact to LT     Reflex Exam:  DTR's:    Absent AJs. Otherwise deep tendon reflexes in the upper and lower extremities are brisk bilaterally.   Toes:    The toes are equiv bilaterally.   Clonus:    Clonus is absent.    Assessment/Plan:   80 y.o. female here as requested by [place] Grayer, MD for tremor in the head and memory complaints.  Past medical history degenerative disc disease, hyperlipidemia, hypertension, hypothyroidism, migraines, neuropathy. Patient has tried metoprolol 50mg , carvedilol 6.25mg , benazepril 10 mg, amlodipine 5mg , all for 2 months or more    Tremor head: May be essential tremor, sounds benign, did not observe today Ataxia: Needs MRI of the cervical spine for evaluation of cervical stenosis, she has abnormal reflexes and mild ataxia on gait, hx of chronic degenerative cervical changes Low pressure headache?: MRI of the brain and cervical spine, history suggests possibly low-pressure headache, repeatedly and consistently positional, she has significant degenerative changes in the neck, csf leak? May consider a  blood patch or CT Myelogram. Memory loss: MRI brain. Blood work. MMSE  22/30 Migraines: Change Aimovig to Emgality(first dose 2 injections then once monthly thereafter). Start botox. Also consider sleep study (ask partner if snore and do you have a headache prior to waking or only after standing?)    No orders of the defined types were placed in this encounter.  No orders of the defined types were placed in this encounter.   Cc: Ronita Hipps, MD,  Ronita Hipps, MD  Sarina Ill, MD  [facility] [address] [place], [place] [address]  Phone [phone] Fax [phone]  I spent over  115 minutes of face-to-face and non-face-to-face time with patient on the  No diagnosis found. diagnosis.  This included previsit chart review, lab review, study review, order entry, electronic health record documentation, patient education on the different diagnostic and therapeutic options, counseling and coordination of care, risks and benefits of management, compliance, or risk factor reduction

## 2020-07-22 NOTE — Telephone Encounter (Signed)
Emgality approved by [employer]. Received fax stating approved through 03/19/21. Notified pt via mychart. Sent fax to pharmacy. Received a receipt of confirmation.

## 2020-08-11 ENCOUNTER — Telehealth: Payer: Self-pay

## 2020-08-11 MED ORDER — ELIQUIS 5 MG PO TABS: 1.0000 | ORAL_TABLET | Freq: Two times a day (BID) | ORAL | 1 refills | Status: DC

## 2020-08-11 NOTE — Telephone Encounter (Signed)
100f, 73kg, scr 1.5 06/01/20, lovw/allred 03/22/20

## 2020-08-30 ENCOUNTER — Ambulatory Visit: Payer: Medicare HMO | Admitting: Internal Medicine

## 2020-08-30 DIAGNOSIS — I4819 Other persistent atrial fibrillation: Secondary | ICD-10-CM

## 2020-08-30 DIAGNOSIS — I483 Typical atrial flutter: Secondary | ICD-10-CM

## 2020-08-30 DIAGNOSIS — I519 Heart disease, unspecified: Secondary | ICD-10-CM

## 2020-09-09 ENCOUNTER — Ambulatory Visit: Payer: Medicare HMO | Admitting: Adult Health

## 2020-09-14 DIAGNOSIS — R413 Other amnesia: Secondary | ICD-10-CM | POA: Diagnosis not present

## 2020-09-14 DIAGNOSIS — Z6822 Body mass index (BMI) 22.0-22.9, adult: Secondary | ICD-10-CM | POA: Diagnosis not present

## 2020-09-15 DIAGNOSIS — R2681 Unsteadiness on feet: Secondary | ICD-10-CM | POA: Diagnosis not present

## 2020-09-15 DIAGNOSIS — R519 Headache, unspecified: Secondary | ICD-10-CM | POA: Diagnosis not present

## 2020-09-15 DIAGNOSIS — G629 Polyneuropathy, unspecified: Secondary | ICD-10-CM | POA: Diagnosis not present

## 2020-09-15 DIAGNOSIS — G8929 Other chronic pain: Secondary | ICD-10-CM | POA: Diagnosis not present

## 2020-09-28 DIAGNOSIS — E039 Hypothyroidism, unspecified: Secondary | ICD-10-CM | POA: Diagnosis not present

## 2020-09-28 DIAGNOSIS — I10 Essential (primary) hypertension: Secondary | ICD-10-CM | POA: Diagnosis not present

## 2020-10-17 DIAGNOSIS — E7849 Other hyperlipidemia: Secondary | ICD-10-CM | POA: Diagnosis not present

## 2020-10-17 DIAGNOSIS — I10 Essential (primary) hypertension: Secondary | ICD-10-CM | POA: Diagnosis not present

## 2020-10-17 DIAGNOSIS — E039 Hypothyroidism, unspecified: Secondary | ICD-10-CM | POA: Diagnosis not present

## 2020-10-20 ENCOUNTER — Ambulatory Visit: Payer: Medicare HMO | Admitting: Adult Health

## 2020-10-20 ENCOUNTER — Encounter: Payer: Self-pay | Admitting: Adult Health

## 2020-10-20 VITALS — BP 115/70 | HR 67

## 2020-10-20 DIAGNOSIS — G43009 Migraine without aura, not intractable, without status migrainosus: Secondary | ICD-10-CM

## 2020-10-20 DIAGNOSIS — G43709 Chronic migraine without aura, not intractable, without status migrainosus: Secondary | ICD-10-CM

## 2020-10-20 NOTE — Progress Notes (Signed)
       BOTOX PROCEDURE NOTE FOR MIGRAINE HEADACHE    Contraindications and precautions discussed with patient(above). Aseptic procedure was observed and patient tolerated procedure. Procedure performed by Ward Givens, NP  The condition has existed for more than 6 months, and pt does not have a diagnosis of ALS, Myasthenia Gravis or Lambert-Eaton Syndrome.  Risks and benefits of injections discussed and pt agrees to proceed with the procedure.  Written consent obtained  These injections are medically necessary. This is her first injection cycle These injections do not cause sedations or hallucinations which the oral therapies may cause.  Indication/Diagnosis: chronic migraine BOTOX(J0585) injection was performed according to protocol by [employer]. 200 units of BOTOX was dissolved into 4 cc NS.   NDC: [id number]  Type of toxin: Botox  Botox-200 units x 1vials Lot: [id number] Expiration: 03/2023 NDC: [id number]  Bacteriostatic 0.9% Sodium Chloride- 52m total Lot: [id number]: 03-20-22 NDC: [id number] Description of procedure:  The patient was placed in a sitting position. The standard protocol was used for Botox as follows, with 5 units of Botox injected at each site:   -Procerus muscle, midline injection  -Corrugator muscle, bilateral injection  -Frontalis muscle, bilateral injection, with 2 sites each side, medial injection was performed in the upper one third of the frontalis muscle, in the region vertical from the medial inferior edge of the superior orbital rim. The lateral injection was again in the upper one third of the forehead vertically above the lateral limbus of the cornea, 1.5 cm lateral to the medial injection site.  -Temporalis muscle injection, 4 sites, bilaterally. The first injection was 3 cm above the tragus of the ear, second injection site was 1.5 cm to 3 cm up from the first injection site in line with the tragus of the ear. The third injection  site was 1.5-3 cm forward between the first 2 injection sites. The fourth injection site was 1.5 cm posterior to the second injection site.  -Occipitalis muscle injection, 3 sites, bilaterally. The first injection was done one half way between the occipital protuberance and the tip of the mastoid process behind the ear. The second injection site was done lateral and superior to the first, 1 fingerbreadth from the first injection. The third injection site was 1 fingerbreadth superiorly and medially from the first injection site.  -Cervical paraspinal muscle injection, 2 sites, bilateral knee first injection site was 1 cm from the midline of the cervical spine, 3 cm inferior to the lower border of the occipital protuberance. The second injection site was 1.5 cm superiorly and laterally to the first injection site.  -Trapezius muscle injection was performed at 3 sites, bilaterally. The first injection site was in the upper trapezius muscle halfway between the inflection point of the neck, and the acromion. The second injection site was one half way between the acromion and the first injection site. The third injection was done between the first injection site and the inflection point of the neck.   Will return for repeat injection in 3 months.   A 200 units of Botox was used, any botox not used was wasted. The patient tolerated the procedure well, there were no complications of the above procedure.  MWard Givens MSN, NP-C 10/20/2020, 3:01 PM [facility] [address] [place] [phone]

## 2020-10-20 NOTE — Progress Notes (Signed)
Botox-200 units x 1vials Lot: [id number] Expiration: 03/2023 NDC: [id number]  Bacteriostatic 0.9% Sodium Chloride- 37m total Lot: [id number]: 03-20-22 NDC: [id number] Dx: [id number] B/B

## 2020-11-17 DIAGNOSIS — I10 Essential (primary) hypertension: Secondary | ICD-10-CM | POA: Diagnosis not present

## 2020-11-17 DIAGNOSIS — E039 Hypothyroidism, unspecified: Secondary | ICD-10-CM | POA: Diagnosis not present

## 2020-11-17 DIAGNOSIS — E7849 Other hyperlipidemia: Secondary | ICD-10-CM | POA: Diagnosis not present

## 2020-12-05 ENCOUNTER — Other Ambulatory Visit: Payer: Self-pay | Admitting: Internal Medicine

## 2020-12-16 DIAGNOSIS — I4891 Unspecified atrial fibrillation: Secondary | ICD-10-CM | POA: Diagnosis not present

## 2020-12-16 DIAGNOSIS — Z6823 Body mass index (BMI) 23.0-23.9, adult: Secondary | ICD-10-CM | POA: Diagnosis not present

## 2020-12-16 DIAGNOSIS — R413 Other amnesia: Secondary | ICD-10-CM | POA: Diagnosis not present

## 2020-12-16 DIAGNOSIS — Z23 Encounter for immunization: Secondary | ICD-10-CM | POA: Diagnosis not present

## 2020-12-16 DIAGNOSIS — I10 Essential (primary) hypertension: Secondary | ICD-10-CM | POA: Diagnosis not present

## 2020-12-17 ENCOUNTER — Telehealth: Payer: Self-pay | Admitting: Internal Medicine

## 2020-12-17 NOTE — Telephone Encounter (Signed)
The patient has had Afib for at least 30 minute . Her BP cuff determined she was in Afib. Patient noted she had a visit with her PCP yesterday 12/16/20 and was not in Afib. She has not missed any doses of Eliquis. No symptoms. Current BP 98/67 HR 74. BP cuff no longer indicates Afib at this time. Patient advised to continue to monitor if she goes back into Afib and hydrate. Advised when to call office and ED precautions given. Made over due appointment with Dr. Rayann Heman.  Verbalized agreement and understanding.

## 2020-12-17 NOTE — Telephone Encounter (Signed)
Patient c/o Palpitations:  High priority if patient c/o lightheadedness, shortness of breath, or chest pain  How long have you had palpitations/irregular HR/ Afib? Are you having the symptoms now? YES FOR ABOUT 30 MINUTES  Are you currently experiencing lightheadedness, SOB or CP? SOB   Do you have a history of afib (atrial fibrillation) or irregular heart rhythm?  YES  Have you checked your BP or HR? (document readings if available):  88/60 HR 80  Are you experiencing any other symptoms? NO

## 2020-12-29 ENCOUNTER — Other Ambulatory Visit: Payer: Self-pay

## 2020-12-29 ENCOUNTER — Ambulatory Visit: Payer: Medicare HMO | Admitting: Internal Medicine

## 2020-12-29 VITALS — BP 120/72 | HR 69 | Ht 66.0 in | Wt 152.8 lb

## 2020-12-29 DIAGNOSIS — I519 Heart disease, unspecified: Secondary | ICD-10-CM

## 2020-12-29 DIAGNOSIS — I483 Typical atrial flutter: Secondary | ICD-10-CM | POA: Diagnosis not present

## 2020-12-29 DIAGNOSIS — I4819 Other persistent atrial fibrillation: Secondary | ICD-10-CM

## 2020-12-29 MED ORDER — DILTIAZEM HCL 30 MG PO TABS: ORAL_TABLET | ORAL | 3 refills | Status: DC

## 2020-12-29 NOTE — Progress Notes (Signed)
PCP: Ronita Hipps, MD Primary Cardiologist: Dr Debara Pickett Primary EP: Dr Rayann Heman  Bridget [name] is a 80 y.o. female who presents today for routine electrophysiology followup.  Since last being seen in our clinic, the patient reports doing very well.  She had a single episode of AF since I saw her last.  This occurred about 3 weeks ago, lasting less than 30 minutes. She has mild pedal edema.  Today, she denies symptoms of palpitations, chest pain, shortness of breath,  dizziness, presyncope, or syncope.  The patient is otherwise without complaint today.   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   Past Surgical History:  Procedure Laterality Date   ATRIAL FIBRILLATION ABLATION N/A 12/16/2019   Procedure: ATRIAL FIBRILLATION ABLATION;  Surgeon: Thompson Grayer, MD;  Location: [place] [facility];  Service: Cardiovascular;  Laterality: N/A;   CARDIOVERSION N/A 05/02/2019   Procedure: CARDIOVERSION;  Surgeon: Pixie Casino, MD;  Location: [facility] ENDOSCOPY;  Service: Cardiovascular;  Laterality: N/A;   CARDIOVERSION N/A 05/14/2019   Procedure: CARDIOVERSION;  Surgeon: [name], MD;  Location: [place];  Service: Cardiovascular;  Laterality: N/A;   CATARACT EXTRACTION, BILATERAL     CERVICAL DISCECTOMY  2005   with fusion   CHOLECYSTECTOMY  2006   COLONOSCOPY     LUMBAR LAMINECTOMY/DECOMPRESSION MICRODISCECTOMY Right 11/13/2014   Procedure: Right Lumbar four-five Microdiskectomy;  Surgeon: Leeroy Cha, MD;  Location: [place] [facility];  Service: Neurosurgery;  Laterality: Right;  right   POLYPECTOMY     REPLACEMENT TOTAL KNEE Right 05/2012   TEE WITHOUT CARDIOVERSION N/A 05/02/2019   Procedure:  TRANSESOPHAGEAL ECHOCARDIOGRAM (TEE);  Surgeon: Pixie Casino, MD;  Location: [facility] ENDOSCOPY;  Service: Cardiovascular;  Laterality: N/A;   [place] Left 04/2011   TRANSTHORACIC ECHOCARDIOGRAM  12/2008   EF=>55%, borderline conc LVH; trace MR; mod TR; trace AV regurg    ROS- all systems are reviewed and negatives except as per HPI above  Current Outpatient Medications  Medication Sig Dispense Refill   apixaban (ELIQUIS) 5 MG TABS tablet Take 1 tablet (5 mg total) by mouth 2 (two) times daily. 180 tablet 1   b complex vitamins tablet Take 1 tablet by mouth daily. With vitamin c 500 mg     baclofen (LIORESAL) 10 MG tablet Take 0.5 tablets by mouth as needed (as needed for migraine pain and neuropathy.).      Cholecalciferol 25 MCG (1000 UT) tablet Take 1,000 Units by mouth daily.      Dextran 70-Hypromellose (ARTIFICIAL TEARS PF OP) Place 1 drop into both eyes daily as needed (For dry eyes).     furosemide (LASIX) 20 MG tablet Take 20 mg by mouth daily as needed for edema (weight gain).     Galcanezumab-gnlm (EMGALITY) 120 MG/ML SOAJ Inject 120 mg into the skin every 30 (thirty) days. 1.12 mL 11   irbesartan (AVAPRO) 75 MG tablet Take 1 tablet (75 mg total) by mouth daily. 90 tablet 1   levothyroxine (SYNTHROID, LEVOTHROID) 75 MCG tablet Take 75 mcg by mouth daily before  breakfast.     promethazine (PHENERGAN) 25 MG tablet Take 25 mg by mouth every 6 (six) hours as needed for nausea.     rosuvastatin (CRESTOR) 20 MG tablet TAKE 1 TABLET BY MOUTH DAILY AT 6 PM. 90 tablet 1   sertraline (ZOLOFT) 25 MG tablet Take 25 mg by mouth daily.     traMADol (ULTRAM) 50 MG tablet Take 50-100 mg by mouth every 4 (four) hours as needed for moderate pain (as needed for migraine pain and neuropathy.).      Ubrogepant (UBRELVY) 100 MG TABS Take 100 mg by mouth every 2 (two) hours as needed (migraine.). Maximum 200mg  a day. 16 tablet 11   zinc gluconate 50 MG  tablet Take 50 mg by mouth daily.     gabapentin (NEURONTIN) 600 MG tablet Take 300 mg by mouth at bedtime.     No current facility-administered medications for this visit.    Physical Exam: Vitals:   12/29/20 1550  BP: 120/72  Pulse: 69  SpO2: 97%  Weight: 152 lb 12.8 oz (69.3 kg)  Height: 5\' 6"  (1.676 m)    GEN- The patient is well appearing, alert and oriented x 3 today.   Head- normocephalic, atraumatic Eyes-  Sclera clear, conjunctiva pink Ears- hearing intact Oropharynx- clear Lungs- Clear to ausculation bilaterally, normal work of breathing Heart- Regular rate and rhythm, no murmurs, rubs or gallops, PMI not laterally displaced GI- soft, NT, ND, + BS Extremities- no clubbing, cyanosis, +1 pedal edema  Wt Readings from Last 3 Encounters:  12/29/20 152 lb 12.8 oz (69.3 kg)  06/01/20 161 lb (73 kg)  03/22/20 154 lb (69.9 kg)    EKG tracing ordered today is personally reviewed and shows sinus rhythm, LAD  Assessment and Plan:  Persistent afib/ atrial flutter Doing well post ablation off AAD therapy Chads2vasc score is 5.  She is on eliquis Will prescribe dilt 30mg  q6h prn afib  2. Tachycardia mediated CM Resolved with sinus  Risks, benefits and potential toxicities for medications prescribed and/or refilled reviewed with patient today.   Return to AF clinic in 6 months and then annually thereafter   Thompson Grayer MD, [facility] 12/29/2020 4:05 PM

## 2020-12-29 NOTE — Patient Instructions (Addendum)
Medication Instructions:  Diltiazem 30 mg every 6 hours as needed for palpitations  Your physician recommends that you continue on your current medications as directed. Please refer to the Current Medication list given to you today. *If you need a refill on your cardiac medications before your next appointment, please call your pharmacy*  Lab Work: None. If you have labs (blood work) drawn today and your tests are completely normal, you will receive your results only by: [place] (if you have MyChart) OR A paper copy in the mail If you have any lab test that is abnormal or we need to change your treatment, we will call you to review the results.  Testing/Procedures: None.  Follow-Up: At [facility], you and your health needs are our priority.  As part of our continuing mission to provide you with exceptional heart care, we have created designated Provider Care Teams.  These Care Teams include your primary Cardiologist (physician) and Advanced Practice Providers (APPs -  Physician Assistants and Nurse Practitioners) who all work together to provide you with the care you need, when you need it.  Your physician wants you to follow-up in: 6 months with the Afib Clinic.    You will receive a reminder letter in the mail two months in advance. If you don't receive a letter, please call our office to schedule the follow-up appointment.  We recommend signing up for the patient portal called "MyChart".  Sign up information is provided on this After Visit Summary.  MyChart is used to connect with patients for Virtual Visits (Telemedicine).  Patients are able to view lab/test results, encounter notes, upcoming appointments, etc.  Non-urgent messages can be sent to your provider as well.   To learn more about what you can do with MyChart, go to [email or web address].    Any Other Special Instructions Will Be Listed Below (If Applicable).

## 2021-01-17 DIAGNOSIS — E039 Hypothyroidism, unspecified: Secondary | ICD-10-CM | POA: Diagnosis not present

## 2021-01-17 DIAGNOSIS — I10 Essential (primary) hypertension: Secondary | ICD-10-CM | POA: Diagnosis not present

## 2021-01-17 DIAGNOSIS — E782 Mixed hyperlipidemia: Secondary | ICD-10-CM | POA: Diagnosis not present

## 2021-01-18 ENCOUNTER — Ambulatory Visit: Payer: Medicare HMO | Admitting: Adult Health

## 2021-01-18 DIAGNOSIS — G43009 Migraine without aura, not intractable, without status migrainosus: Secondary | ICD-10-CM | POA: Diagnosis not present

## 2021-01-18 DIAGNOSIS — G43709 Chronic migraine without aura, not intractable, without status migrainosus: Secondary | ICD-10-CM

## 2021-01-18 NOTE — Progress Notes (Signed)
Botox- 100 units x 2 vials Lot: [id number] Expiration: 01/2023 NDC: 5003-7048-88  Bacteriostatic 0.9% Sodium Chloride- 20mL total Lot: [id number] Expiration: 03/20/2022 NDC: 0388-8280-03  Dx:G43.009 B/B

## 2021-01-18 NOTE — Progress Notes (Signed)
       BOTOX PROCEDURE NOTE FOR MIGRAINE HEADACHE    Contraindications and precautions discussed with patient(above). Aseptic procedure was observed and patient tolerated procedure. Procedure performed by Ward Givens, NP  The condition has existed for more than 6 months, and pt does not have a diagnosis of ALS, Myasthenia Gravis or Lambert-Eaton Syndrome.  Risks and benefits of injections discussed and pt agrees to proceed with the procedure.  Written consent obtained  These injections are medically necessary. This is her second injection cycle. These injections do not cause sedations or hallucinations which the oral therapies may cause.  Indication/Diagnosis: chronic migraine BOTOX(J0585) injection was performed according to protocol by [employer]. 200 units of BOTOX was dissolved into 4 cc NS.   NDC: [address]-06  Type of toxin: Botox  \Botox- 100 units x 2 vials Lot: [id number] Expiration: 01/2023 NDC: 2703-5009-38  Bacteriostatic 0.9% Sodium Chloride- 79mL total Lot: [id number] Expiration: 03/20/2022 NDC: 7169-6789-38  Dx:G43.009 B/B   Description of procedure:  The patient was placed in a sitting position. The standard protocol was used for Botox as follows, with 5 units of Botox injected at each site:   -Procerus muscle, midline injection  -Corrugator muscle, bilateral injection  -Frontalis muscle, bilateral injection, with 2 sites each side, medial injection was performed in the upper one third of the frontalis muscle, in the region vertical from the medial inferior edge of the superior orbital rim. The lateral injection was again in the upper one third of the forehead vertically above the lateral limbus of the cornea, 1.5 cm lateral to the medial injection site.  -Temporalis muscle injection, 4 sites, bilaterally. The first injection was 3 cm above the tragus of the ear, second injection site was 1.5 cm to 3 cm up from the first injection site in line with the tragus  of the ear. The third injection site was 1.5-3 cm forward between the first 2 injection sites. The fourth injection site was 1.5 cm posterior to the second injection site.  -Occipitalis muscle injection, 3 sites, bilaterally. The first injection was done one half way between the occipital protuberance and the tip of the mastoid process behind the ear. The second injection site was done lateral and superior to the first, 1 fingerbreadth from the first injection. The third injection site was 1 fingerbreadth superiorly and medially from the first injection site.  -Cervical paraspinal muscle injection, 2 sites, bilateral knee first injection site was 1 cm from the midline of the cervical spine, 3 cm inferior to the lower border of the occipital protuberance. The second injection site was 1.5 cm superiorly and laterally to the first injection site.  -Trapezius muscle injection was performed at 3 sites, bilaterally. The first injection site was in the upper trapezius muscle halfway between the inflection point of the neck, and the acromion. The second injection site was one half way between the acromion and the first injection site. The third injection was done between the first injection site and the inflection point of the neck.   Will return for repeat injection in 3 months.   A 200 units of Botox was used, 155 units were injected, the rest of the Botox was wasted. The patient tolerated the procedure well, there were no complications of the above procedure.  Ward Givens, MSN, NP-C 01/18/2021, 11:31 AM [facility] [address] [place], [place] [address] [phone]

## 2021-01-20 ENCOUNTER — Ambulatory Visit: Payer: Medicare HMO | Admitting: Adult Health

## 2021-02-18 NOTE — Progress Notes (Addendum)
Cardiology Office Note:    Date:  02/28/2021   ID:  Bridget [name], DOB 01-02-1941, MRN [id number]  PCP:  Bridget Ponto, MD  Cardiologist:  Bridget Nose, MD  Electrophysiologist:  None   Referring MD: Bridget Ponto, MD   Chief Complaint: follow-up of atrial fibrillation/flutter and cardiomyopathy  History of Present Illness:    Bridget [name] is a 80 y.o. 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 Rennis [name] and presents today for routien follow-up of atrial fibrillation/flutter and cardiomyopathy.  Patient was diagnosed with atrial flutter with RVR in 04/2019. She was started on Eliquis and underwent TEE/DCCV which initially successful but she had recurrent atrial flutter requiring repeat DCCV one week later which was again only temporary. She was also found to have reduced EF of 30-35% on TEE which was felt to be tachy-mediated related. She was [facility] and was started on Amiodarone but continued to have recurrent symptomatic atrial fibrillation/flutter. Repeat Echo in 05/2019 showed normalization of EF to 55-60%. She was seen by Dr. Johney Frame and decision was bade to proceed with atrial fibrillation ablation which was done in 11/2019. She has done well since then and was able to stop Amiodarone. She was last seen by Dr. Johney Frame in 12/2020 at which time she reported 1 single episode of atrial fibrillation/flutter lasting less than 30 minutes but otherwise was doing well. She was prescribed short acting Diltiazem to take as needed for atrial fibrillation.  Patient presents today for follow-up. Here with daughter. She denies any recurrent problems with atrial fibrillation/flutter. No lightheadedness, dizziness, syncope. She does have significant  bilateral lower extremity edema which she states she noticed about 3 weeks ago. She has PRN Lasix to take for weight gain and edema and states she has taken a couple of doses of this but is not sure how much it has helped. She also reports some dyspnea on exertion when going up a flight of steps which is new over the last 3 weeks but no shortness of breath at rest. No orthopnea or PND. No chest pain.  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    Past Surgical History:  Procedure Laterality Date   ATRIAL FIBRILLATION ABLATION N/A 12/16/2019   Procedure: ATRIAL FIBRILLATION ABLATION;  Surgeon: Bridget Range, MD;  Location: [facility] INVASIVE CV LAB;  Service: Cardiovascular;  Laterality: N/A;   CARDIOVERSION N/A 05/02/2019   Procedure: CARDIOVERSION;  Surgeon: Bridget Nose, MD;  Location: [facility] ENDOSCOPY;  Service: Cardiovascular;  Laterality: N/A;   CARDIOVERSION N/A 05/14/2019   Procedure: CARDIOVERSION;  Surgeon: Bridget Poisson, MD;  Location: [facility] ENDOSCOPY;  Service: Cardiovascular;  Laterality: N/A;   CATARACT EXTRACTION, BILATERAL  CERVICAL DISCECTOMY  2005   with fusion   CHOLECYSTECTOMY  2006   COLONOSCOPY     LUMBAR LAMINECTOMY/DECOMPRESSION MICRODISCECTOMY Right 11/13/2014   Procedure: Right Lumbar four-five Microdiskectomy;  Surgeon: Bridget Lias, MD;  Location: [facility] NEURO ORS;  Service: Neurosurgery;  Laterality: Right;  right   POLYPECTOMY     REPLACEMENT TOTAL KNEE Right 05/2012   TEE  WITHOUT CARDIOVERSION N/A 05/02/2019   Procedure: TRANSESOPHAGEAL ECHOCARDIOGRAM (TEE);  Surgeon: Bridget Nose, MD;  Location: [facility] ENDOSCOPY;  Service: Cardiovascular;  Laterality: N/A;   TOTAL ABDOMINAL HYSTERECTOMY  1978   TOTAL HIP ARTHROPLASTY Left 04/2011   TRANSTHORACIC ECHOCARDIOGRAM  12/2008   EF=>55%, borderline conc LVH; trace MR; mod TR; trace AV regurg    Current Medications: Current Meds  Medication Sig   apixaban (ELIQUIS) 5 MG TABS tablet Take 1 tablet (5 mg total) by mouth 2 (two) times daily.   b complex vitamins tablet Take 1 tablet by mouth daily. With vitamin c 500 mg   baclofen (LIORESAL) 10 MG tablet Take 0.5 tablets by mouth as needed (as needed for migraine pain and neuropathy.).    Cholecalciferol 25 MCG (1000 UT) tablet Take 1,000 Units by mouth daily.    Dextran 70-Hypromellose (ARTIFICIAL TEARS PF OP) Place 1 drop into both eyes daily as needed (For dry eyes).   diltiazem (CARDIZEM) 30 MG tablet 2 tablets every 6 hours for palpitations   gabapentin (NEURONTIN) 600 MG tablet Take 300 mg by mouth at bedtime.   irbesartan (AVAPRO) 75 MG tablet Take 1 tablet (75 mg total) by mouth daily.   levothyroxine (SYNTHROID, LEVOTHROID) 75 MCG tablet Take 75 mcg by mouth daily before breakfast.   potassium chloride SA (KLOR-CON M) 20 MEQ tablet Take 1 tablet (20 mEq total) by mouth as needed (Take on days Lasix is taken).   promethazine (PHENERGAN) 25 MG tablet Take 25 mg by mouth every 6 (six) hours as needed for nausea.   rosuvastatin (CRESTOR) 20 MG tablet TAKE 1 TABLET BY MOUTH DAILY AT 6 PM.   sertraline (ZOLOFT) 25 MG tablet Take 25 mg by mouth daily.   traMADol (ULTRAM) 50 MG tablet Take 50-100 mg by mouth every 4 (four) hours as needed for moderate pain (as needed for migraine pain and neuropathy.).    Ubrogepant (UBRELVY) 100 MG TABS Take 100 mg by mouth every 2 (two) hours as needed (migraine.). Maximum 200mg  a day.   zinc gluconate 50 MG tablet Take 50 mg by mouth  daily.   [DISCONTINUED] furosemide (LASIX) 20 MG tablet Take 20 mg by mouth daily as needed for edema (weight gain).     Allergies:   Codeine, Sulfonamide derivatives, Triptans, Amlodipine, Chocolate, Onion, and Other   Social History   Socioeconomic History   Marital status: Married    Spouse name: Not on file   Number of children: 2   Years of education: 13   Highest education level: Not on file  Occupational History   Occupation: retired    Comment: medical records admin  Tobacco Use   Smoking status: Never   Smokeless tobacco: Never  Vaping Use   Vaping Use: Never used  Substance and Sexual Activity   Alcohol use: No   Drug use: No   Sexual activity: Not on file  Other Topics Concern   Not on file  Social History Narrative   Lives at home with husband   Right handed   Caffeine: maybe 1 cup/day   Social Determinants of  Health   Financial Resource Strain: Not on file  Food Insecurity: Not on file  Transportation Needs: Not on file  Physical Activity: Not on file  Stress: Not on file  Social Connections: Not on file     Family History: The patient's family history includes Breast cancer in her paternal grandmother; Colon cancer in her cousin; Colon polyps in her father; Coronary artery disease (age of onset: 15) in her father; Esophageal cancer in her cousin; Stroke in her maternal grandfather and paternal grandmother; Stroke (age of onset: 63) in her mother; Suicidality in her daughter. There is no history of Rectal cancer, Stomach cancer, Tremor, Parkinson's disease, Alzheimer's disease, or Dementia.  ROS:   Please see the history of present illness.     EKGs/Labs/Other Studies Reviewed:    The following studies were reviewed today:  TEE/DCCV 05/02/2019: Impressions:  1. Left ventricular ejection fraction, by estimation, is 30 to 35%. The  left ventricle has moderately decreased function. The left ventricle  demonstrates global hypokinesis. The left  ventricular internal cavity size  was mildly dilated. Left ventricular  diastolic function could not be evaluated.   2. Right ventricular systolic function is normal. The right ventricular  size is normal.   3. Left atrial size was mildly dilated. No left atrial/left atrial  appendage thrombus was detected.   4. The mitral valve is grossly normal. Mild mitral valve regurgitation.   5. The aortic valve is tricuspid. Aortic valve regurgitation is mild.   6. Cannot exclude small PFO.   Conclusion(s)/Recomendation(s): No LA/LAA thrombus identified. Successful cardioversion performed with restoration of normal sinus rhythm.  _______________  TTE 05/27/2019: Impressions:  1. Left ventricular ejection fraction, by estimation, is 55 to 60%. The  left ventricle has normal function. The left ventricle has no regional  wall motion abnormalities. The left ventricular internal cavity size was  mildly dilated. Left ventricular  diastolic parameters are indeterminate.   2. Right ventricular systolic function is normal. The right ventricular  size is normal. Tricuspid regurgitation signal is inadequate for assessing  PA pressure.   3. The mitral valve is normal in structure. Trivial mitral valve  regurgitation.   4. The aortic valve is tricuspid. Aortic valve regurgitation is mild to  moderate. No aortic stenosis is present.   5. Aortic dilatation noted. 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   6. The inferior vena cava is normal in size with greater than 50%  respiratory variability, suggesting right atrial pressure of 3 mmHg. _______________  Atrial Fibrillation Ablation 12/16/2019: Conclusion: 1. Sinus rhythm upon presentation.   2. Intracardiac echo reveals a moderate to large sized left atrium. 3. Successful electrical isolation and anatomical encircling of all four pulmonary veins with radiofrequency current. 4. Ectopic atrial tachycardia was observed however this  focus terminated during 3D mapping and was not reinducible. 5. . Cavo-tricuspid isthmus ablation performed with complete bidirectional isthmus block achieved 6. No inducible arrhythmias following ablation 7. No early apparent complications.  EKG:  EKG not ordered today.   Recent Labs: 06/01/2020: ALT 16; BUN 23; Creatinine, Ser 1.50; Hemoglobin 13.2; Platelets 144; Potassium 4.4; Sodium 146  Recent Lipid Panel    Component Value Date/Time   CHOL 221 (H) 05/13/2019 0600   CHOL 179 05/22/2018 1023   CHOL 146 04/14/2014 0952   TRIG 124 05/13/2019 0600   TRIG 94 04/14/2014 0952   HDL 48 05/13/2019 0600   HDL 68 05/22/2018 1023   HDL 59 04/14/2014 0952  CHOLHDL 4.6 05/13/2019 0600   VLDL 25 05/13/2019 0600   LDLCALC 148 (H) 05/13/2019 0600   LDLCALC 88 05/22/2018 1023   LDLCALC 68 04/14/2014 0952    Physical Exam:    Vital Signs: BP 140/76   Pulse 78   Ht 5\' 7"  (1.702 m)   Wt 159 lb (72.1 kg)   LMP  (LMP Unknown)   SpO2 96%   BMI 24.90 kg/m     Wt Readings from Last 3 Encounters:  02/28/21 159 lb (72.1 kg)  12/29/20 152 lb 12.8 oz (69.3 kg)  06/01/20 161 lb (73 kg)     General: 80 y.o. Caucasian female in no acute distress. HEENT: Normocephalic and atraumatic. Sclera clear.  Neck: Supple. No carotid bruits. No JVD. Heart: RRR. Distinct S1 and S2. No murmurs, gallops, or rubs. Radial pulses 2+ and equal bilaterally. Lungs: No increased work of breathing. Clear to ausculation bilaterally. No wheezes, rhonchi, or rales.  Abdomen: Soft, non-distended, and non-tender to palpation.  MSK: Ambulates with a cane. Extremities: 2+ lower extremity edema bilaterally extending up to mid shin.   Skin: Warm and dry. Neuro: Alert and oriented x3. No focal deficits. Psych: Normal affect. Responds appropriately.  Assessment:    1. Acute on chronic combined systolic and diastolic CHF (congestive heart failure) (HCC)   2. Nonischemic cardiomyopathy (HCC)   3. Persistent atrial  fibrillation/flutter   4. Primary hypertension   5. Medication management   6. Hyperlipidemia, unspecified hyperlipidemia type     Plan:    Tachymediated Cardiomyopathy Possible Mild Acute on Chronic Combined CHF LVEF 30-35% on TEE in 04/2019. Felt to be due to tachyarrhythmia. Last TTE in 05/2019 showed normalization of LVEF to 55-60%.  - Patient has significant lower extremity edema, new dyspnea on exertion, and a 7lb weight gain since last visit.  - Will check BNP and update Echo. - Currently on Lasix 20mg  daily as needed for weight gain and edema. Will increase to 40mg  daily for 7 days and then reduce to 40mg  daily only PRN for weight gain and edema. Will give KCl 20 mEq to take with Lasix 40mg . - Continue Irbesartan 75mg  daily. - Previously on Coreg but no longer on this (unclear why). If EF has dropped on repeat Echo, will restart this. - Discussed daily weight and sodium/fluid restrictions.  - Will check CMET today and then repeat BMET in 1 week given increase in Lasix.  Persistent Atrial Fibrillation/Flutter S/p DCCV x2 in 04/2019 and then atrial fibrillation ablation in 11/2019. She has done well since the ablation. No longer on Amiodarone. - Maintaining sinus rhythm on exam. - Continue PRN Diltiazem for atrial fibrillation/flutter. - Continue Eliquis 5mg  twice daily.  Hypertension BP mildly elevated today at 140/76.  - Continue Irbesartan as above. - Hopefully BP will improve with diuresis. - Advised patient to keep BP/HR log for 2 weeks and then send this to [place]. If BP above goal, will likely add back Coreg.  Hyperlipidemia Last lipid panel from 02/2020 (Per [facility]): Total Cholesterol 174, Triglycerides 88, HDL 56, LDL 148.  - Continue Crestor 20mg  daily. - Will plan to repeat lipid panel when she comes in for repeat BMET in 1 week. Suspect Crestor will need to be increased at that time.  Disposition: Follow up in 1 month.   Medication Adjustments/Labs and Tests  Ordered: Current medicines are reviewed at length with the patient today.  Concerns regarding medicines are outlined above.  Orders Placed This Encounter  Procedures  Lipid panel   Brain natriuretic peptide   Basic metabolic panel   Comprehensive metabolic panel   ECHOCARDIOGRAM COMPLETE   Meds ordered this encounter  Medications   potassium chloride SA (KLOR-CON M) 20 MEQ tablet    Sig: Take 1 tablet (20 mEq total) by mouth as needed (Take on days Lasix is taken).    Dispense:  30 tablet    Refill:  2   furosemide (LASIX) 20 MG tablet    Sig: Take 2 tablets (40 mg total) by mouth daily as needed for edema (weight gain and/or swelling).    Dispense:  30 tablet    Refill:  2    Patient Instructions  Medication Instructions:  INCREASE Lasix to 40 mg daily for 7 days, then take as needed for swelling or weight gain of 3 lbs overnight or 5 lbs in a week START Potassium 20 meq daily for 7 days then take as needed for swelling and/or weight gain of 3 lbs overnight or 5 lbs in a week *If you need a refill on your cardiac medications before your next appointment, please call your pharmacy*  Lab Work: Your physician recommends that you return for lab work TODAY:  BNP CMET  Your physician recommends that you return for lab work in 1 week:  BMET Fasting Lipid Panel-DO NOT EAT OR DRINK PAST MIDNIGHT. OKAY TO HAVE WATER.  If you have labs (blood work) drawn today and your tests are completely normal, you will receive your results only by: MyChart Message (if you have MyChart) OR A paper copy in the mail If you have any lab test that is abnormal or we need to change your treatment, we will call you to review the results.  Testing/Procedures: Your physician has requested that you have an echocardiogram. Echocardiography is a painless test that uses sound waves to create images of your heart. It provides your doctor with information about the size and shape of your heart and how well your  heart's chambers and valves are working. This procedure takes approximately one hour. There are no restrictions for this procedure.]  Follow-Up: At [facility], you and your health needs are our priority.  As part of our continuing mission to provide you with exceptional heart care, we have created designated Provider Care Teams.  These Care Teams include your primary Cardiologist (physician) and Advanced Practice Providers (APPs -  Physician Assistants and Nurse Practitioners) who all work together to provide you with the care you need, when you need it.   Your next appointment:   1 month(s)  The format for your next appointment:   In Person  Provider:   Marjie Skiff, PA-C       Other Instructions Monitor blood pressure at home for 2 weeks   Signed, Corrin Parker, PA-C  02/28/2021 1:45 PM    [facility]

## 2021-02-26 ENCOUNTER — Encounter: Payer: Self-pay | Admitting: Student

## 2021-02-26 DIAGNOSIS — I428 Other cardiomyopathies: Secondary | ICD-10-CM | POA: Insufficient documentation

## 2021-02-28 ENCOUNTER — Encounter: Payer: Self-pay | Admitting: Student

## 2021-02-28 ENCOUNTER — Other Ambulatory Visit: Payer: Self-pay

## 2021-02-28 ENCOUNTER — Ambulatory Visit: Payer: Medicare HMO | Admitting: Student

## 2021-02-28 VITALS — BP 140/76 | HR 78 | Ht 67.0 in | Wt 159.0 lb

## 2021-02-28 DIAGNOSIS — Z79899 Other long term (current) drug therapy: Secondary | ICD-10-CM

## 2021-02-28 DIAGNOSIS — I10 Essential (primary) hypertension: Secondary | ICD-10-CM

## 2021-02-28 DIAGNOSIS — I4819 Other persistent atrial fibrillation: Secondary | ICD-10-CM | POA: Diagnosis not present

## 2021-02-28 DIAGNOSIS — I428 Other cardiomyopathies: Secondary | ICD-10-CM | POA: Diagnosis not present

## 2021-02-28 DIAGNOSIS — I5043 Acute on chronic combined systolic (congestive) and diastolic (congestive) heart failure: Secondary | ICD-10-CM

## 2021-02-28 DIAGNOSIS — R0609 Other forms of dyspnea: Secondary | ICD-10-CM | POA: Diagnosis not present

## 2021-02-28 DIAGNOSIS — E785 Hyperlipidemia, unspecified: Secondary | ICD-10-CM

## 2021-02-28 MED ORDER — FUROSEMIDE 20 MG PO TABS: 40.0000 mg | ORAL_TABLET | Freq: Every day | ORAL | 2 refills | Status: DC | PRN

## 2021-02-28 MED ORDER — POTASSIUM CHLORIDE CRYS ER 20 MEQ PO TBCR: 20.0000 meq | EXTENDED_RELEASE_TABLET | ORAL | 2 refills | Status: DC | PRN

## 2021-02-28 NOTE — Patient Instructions (Addendum)
Medication Instructions:  INCREASE Lasix to 40 mg daily for 7 days, then take as needed for swelling or weight gain of 3 lbs overnight or 5 lbs in a week START Potassium 20 meq daily for 7 days then take as needed for swelling and/or weight gain of 3 lbs overnight or 5 lbs in a week *If you need a refill on your cardiac medications before your next appointment, please call your pharmacy*  Lab Work: Your physician recommends that you return for lab work TODAY:  BNP CMET  Your physician recommends that you return for lab work in 1 week:  BMET Fasting Lipid Panel-DO NOT EAT OR DRINK PAST MIDNIGHT. OKAY TO HAVE WATER.  If you have labs (blood work) drawn today and your tests are completely normal, you will receive your results only by: [place] (if you have MyChart) OR A paper copy in the mail If you have any lab test that is abnormal or we need to change your treatment, we will call you to review the results.  Testing/Procedures: Your physician has requested that you have an echocardiogram. Echocardiography is a painless test that uses sound waves to create images of your heart. It provides your doctor with information about the size and shape of your heart and how well your heart's chambers and valves are working. This procedure takes approximately one hour. There are no restrictions for this procedure.]  Follow-Up: At [facility], you and your health needs are our priority.  As part of our continuing mission to provide you with exceptional heart care, we have created designated Provider Care Teams.  These Care Teams include your primary Cardiologist (physician) and Advanced Practice Providers (APPs -  Physician Assistants and Nurse Practitioners) who all work together to provide you with the care you need, when you need it.   Your next appointment:   1 month(s)  The format for your next appointment:   In Person  Provider:   Sande Rives, PA-C       Other  Instructions Monitor blood pressure at home for 2 weeks

## 2021-03-01 LAB — COMPREHENSIVE METABOLIC PANEL
ALT: 15 IU/L (ref 0–32)
AST: 20 IU/L (ref 0–40)
Albumin/Globulin Ratio: 1.6 (ref 1.2–2.2)
Albumin: 4 g/dL (ref 3.7–4.7)
Alkaline Phosphatase: 74 IU/L (ref 44–121)
BUN/Creatinine Ratio: 16 (ref 12–28)
BUN: 19 mg/dL (ref 8–27)
Bilirubin Total: 0.4 mg/dL (ref 0.0–1.2)
CO2: 26 mmol/L (ref 20–29)
Calcium: 9.9 mg/dL (ref 8.7–10.3)
Chloride: 102 mmol/L (ref 96–106)
Creatinine, Ser: 1.22 mg/dL — ABNORMAL HIGH (ref 0.57–1.00)
Globulin, Total: 2.5 g/dL (ref 1.5–4.5)
Glucose: 70 mg/dL (ref 70–99)
Potassium: 4.4 mmol/L (ref 3.5–5.2)
Sodium: 141 mmol/L (ref 134–144)
Total Protein: 6.5 g/dL (ref 6.0–8.5)
eGFR: 45 mL/min/{1.73_m2} — ABNORMAL LOW (ref >59)

## 2021-03-01 LAB — BRAIN NATRIURETIC PEPTIDE: BNP: 56.2 pg/mL (ref 0.0–100.0)

## 2021-03-09 ENCOUNTER — Other Ambulatory Visit: Payer: Self-pay | Admitting: Student

## 2021-03-09 MED ORDER — FUROSEMIDE 20 MG PO TABS: 40.0000 mg | ORAL_TABLET | Freq: Every day | ORAL | 3 refills | Status: DC | PRN

## 2021-03-09 NOTE — Addendum Note (Signed)
Addended by: Carter Kitten D on: 03/09/2021 04:57 PM   Modules accepted: Orders

## 2021-03-09 NOTE — Telephone Encounter (Signed)
Hey pt was last seen by Sande Rives, PA, at [facility], seen for Dr. Debara Pickett. Please refill medication base on this office visit. Thanks

## 2021-03-09 NOTE — Telephone Encounter (Signed)
This is Dr. Hilty's pt 

## 2021-03-09 NOTE — Telephone Encounter (Signed)
Pt's medication was sent to pt's pharmacy as requested. Confirmation received.  °

## 2021-03-09 NOTE — Addendum Note (Signed)
Addended by: Carter Kitten D on: 03/09/2021 04:43 PM   Modules accepted: Orders

## 2021-03-09 NOTE — Telephone Encounter (Signed)
Pt's medication that was sent in was not enough medication dispense, please send in the correct amount of tablets that pt supposed to be taking. Please address

## 2021-03-15 ENCOUNTER — Other Ambulatory Visit: Payer: Self-pay | Admitting: Student

## 2021-03-15 NOTE — Telephone Encounter (Signed)
This is Dr. Hilty's pt 

## 2021-03-17 ENCOUNTER — Ambulatory Visit (HOSPITAL_COMMUNITY): Payer: Medicare HMO | Attending: Cardiovascular Disease

## 2021-03-17 ENCOUNTER — Other Ambulatory Visit: Payer: Self-pay

## 2021-03-17 DIAGNOSIS — I428 Other cardiomyopathies: Secondary | ICD-10-CM | POA: Diagnosis present

## 2021-03-17 LAB — ECHOCARDIOGRAM COMPLETE
Area-P 1/2: 3.48 cm2
P 1/2 time: 691 msec
S' Lateral: 2.8 cm

## 2021-03-25 DIAGNOSIS — G629 Polyneuropathy, unspecified: Secondary | ICD-10-CM | POA: Diagnosis not present

## 2021-04-11 NOTE — Progress Notes (Deleted)
Office Visit    Patient Name: Bridget [name] Date of Encounter: 04/11/2021  PCP:  Ronita Hipps, MD   [place]  Cardiologist:  Pixie Casino, MD  Advanced Practice Provider:  No care team member to display Electrophysiologist:  None   HPI    Bridget [name] is a 81 y.o. 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  The patient was diagnosed with atrial flutter with RVR in 04/2019.  She was started on Eliquis and underwent TEE/DCCV which was initially successful but she had recurrent atrial flutter requiring repeat DCCV 1 week later which was again only temporary.  She was also found to have reduced EF of 30 to 35% on TEE which was felt to be tachy-mediated related.  She was referred to the atrial fibrillation clinic and was started on amiodarone but continued to have recurrent symptomatic atrial fibrillation/flutter.  Repeat echo in 05/2019 showed normalized EF to 55 to 60%.  She saw Dr. Rayann Heman and the decision to proceed with atrial fibrillation was made and done on 11/2019.  She has done well since then and was able to stop amiodarone.  She last saw Dr. Rayann Heman 12/2020 and at that time reported 1 single episode of atrial fibrillation/flutter lasting less than 30 seconds.  She was prescribed short acting diltiazem to take as needed for A. fib.  Patient was last seen by Sande Rives, PA.  She denies any recurrent problems with atrial fibrillation/flutter.  She did have significant bilateral lower extremity edema which she had noticed 3 weeks ago.  She does have as needed Lasix for weight gain and edema and states she has taken it a couple of times.  She also reports dyspnea on exertion when going up a flight  of steps which is new.  An updated echocardiogram was obtained with LVEF 55 to 60%, grade 2 DD, mild LVH, mild aortic regurgitation and mild dilatation of the ascending aorta.  Overall, no significant change from previous echo.  Has increased for 7 days.  Last CMP with slightly elevated creatinine 1.22.  Today, she ***  Past Medical History    Past Medical History:  Diagnosis Date   Allergy    Arthritis    hip OA   Degenerative disc disease    GERD (gastroesophageal reflux disease)    Hyperlipidemia    Hypertension    Hypothyroidism    Migraines    Neuromuscular disorder ([place])    Neuropathy    Nonischemic cardiomyopathy ([place])    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   Past Surgical History:  Procedure Laterality Date   ATRIAL FIBRILLATION ABLATION N/A 12/16/2019   Procedure: ATRIAL FIBRILLATION ABLATION;  Surgeon: Thompson Grayer, MD;  Location: [facility];  Service: Cardiovascular;  Laterality: N/A;   CARDIOVERSION N/A 05/02/2019   Procedure: CARDIOVERSION;  Surgeon: Pixie Casino, MD;  Location: [facility];  Service: Cardiovascular;  Laterality: N/A;   CARDIOVERSION N/A 05/14/2019   Procedure: CARDIOVERSION;  Surgeon: [name], MD;  Location: [facility];  Service: Cardiovascular;  Laterality: N/A;   CATARACT EXTRACTION, BILATERAL     CERVICAL DISCECTOMY  2005   with fusion   CHOLECYSTECTOMY  2006   COLONOSCOPY     LUMBAR LAMINECTOMY/DECOMPRESSION MICRODISCECTOMY Right 11/13/2014   Procedure: Right Lumbar four-five Microdiskectomy;  Surgeon: Leeroy Cha, MD;  Location: [facility];  Service: Neurosurgery;  Laterality: Right;  right   POLYPECTOMY     REPLACEMENT TOTAL KNEE Right 05/2012   TEE WITHOUT CARDIOVERSION N/A 05/02/2019   Procedure:  TRANSESOPHAGEAL ECHOCARDIOGRAM (TEE);  Surgeon: Pixie Casino, MD;  Location: [facility] ENDOSCOPY;  Service: Cardiovascular;  Laterality: N/A;   TOTAL ABDOMINAL HYSTERECTOMY  1978   TOTAL HIP ARTHROPLASTY Left 04/2011   TRANSTHORACIC ECHOCARDIOGRAM  12/2008   EF=>55%, borderline conc LVH; trace MR; mod TR; trace AV regurg    Allergies  Allergies  Allergen Reactions   Codeine Anaphylaxis   Sulfonamide Derivatives Anaphylaxis   Triptans Other (See Comments)    Stopped blood supply to mesenteric artery per pt   Amlodipine Other (See Comments)    swelling   Chocolate Other (See Comments)    Headache   Onion Other (See Comments)    Headache   Other     MSG- headaches    EKGs/Labs/Other Studies Reviewed:   The following studies were reviewed today:  Echocardiogram 03/17/2021 IMPRESSIONS     1. Left ventricular ejection fraction, by estimation, is 55 to 60%. The  left ventricle has normal function. The left ventricle has no regional  wall motion abnormalities. Left ventricular diastolic parameters are  consistent with Grade II diastolic  dysfunction (pseudonormalization).   2. Right ventricular systolic function is normal. The right ventricular  size is normal. There is normal pulmonary artery systolic pressure. The  estimated right ventricular systolic pressure is 08.6 mmHg.   3. Left atrial size was mildly dilated.   4. The mitral valve is grossly normal. Trivial mitral valve  regurgitation. No evidence of mitral stenosis.   5. The aortic valve is tricuspid. There is mild calcification of the  aortic valve. Aortic valve regurgitation is moderate. No aortic stenosis  is present.   6. There is mild dilatation of the ascending aorta, measuring 41 mm.   7. The inferior vena cava is normal in size with greater than 50%  respiratory variability, suggesting right atrial pressure of 3 mmHg.   Comparison(s): No significant change from prior study. EF unchanged. AI is  moderate.    EKG:  EKG is *** ordered today.  The ekg ordered today demonstrates ***  Recent Labs: 06/01/2020: Hemoglobin 13.2; Platelets 144 02/28/2021: ALT 15; BNP 56.2; BUN 19; Creatinine, Ser 1.22; Potassium 4.4; Sodium 141  Recent Lipid Panel    Component Value Date/Time   CHOL 221 (H) 05/13/2019 0600   CHOL 179 05/22/2018 1023   CHOL 146 04/14/2014 0952   TRIG 124 05/13/2019 0600   TRIG 94 04/14/2014 0952   HDL 48 05/13/2019 0600   HDL 68 05/22/2018 1023   HDL 59 04/14/2014 0952   CHOLHDL 4.6 05/13/2019 0600   VLDL 25 05/13/2019 0600   LDLCALC 148 (H) 05/13/2019 0600   LDLCALC 88 05/22/2018 1023   LDLCALC 68 04/14/2014 0952    Risk Assessment/Calculations:  {Does this patient have ATRIAL FIBRILLATION?:[phone]}  Home Medications   No outpatient medications have been marked as taking for the 04/12/21 encounter (Appointment) with Almyra Deforest, PA.     Review of Systems   ***   All other systems reviewed and are otherwise negative  except as noted above.  Physical Exam    VS:  LMP  (LMP Unknown)  , BMI There is no height or weight on file to calculate BMI.  Wt Readings from Last 3 Encounters:  02/28/21 159 lb (72.1 kg)  12/29/20 152 lb 12.8 oz (69.3 kg)  06/01/20 161 lb (73 kg)     GEN: Well nourished, well developed, in no acute distress. HEENT: normal. Neck: Supple, no JVD, carotid bruits, or masses. Cardiac: ***RRR, no murmurs, rubs, or gallops. No clubbing, cyanosis, edema.  ***Radials/PT 2+ and equal bilaterally.  Respiratory:  ***Respirations regular and unlabored, clear to auscultation bilaterally. GI: Soft, nontender, nondistended. MS: No deformity or atrophy. Skin: Warm and dry, no rash. Neuro:  Strength and sensation are intact. Psych: Normal affect.  Assessment & Plan    Tachycardia mediated cardiomyopathy -Previous LVEF 30 to 35%, improved to 55 to 60% -Updated echocardiogram stable without any major changes -Lasix increased x7 days at last visit for  fluid overload  Persistent atrial fibrillation/flutter -Status post DCCV x2 in 04/2019 and then atrial fibrillation ablation 11/2019 -Off amiodarone and maintaining sinus rhythm -Continue as needed diltiazem -Continue Lasix 5 mg twice daily  Hypertension -Continue Irbesartan 75 mg daily  Hyperlipidemia -We will need updated lipid panel if not already done -Likely will need to titrate Crestor based on previous lipid panel      Disposition: Follow up {follow up:15908} with Pixie Casino, MD or APP.  Signed, Elgie Collard, PA-C 04/11/2021, 11:54 AM [place]

## 2021-04-12 ENCOUNTER — Ambulatory Visit: Payer: Medicare HMO | Admitting: Physician Assistant

## 2021-04-12 DIAGNOSIS — I428 Other cardiomyopathies: Secondary | ICD-10-CM

## 2021-04-12 DIAGNOSIS — E782 Mixed hyperlipidemia: Secondary | ICD-10-CM

## 2021-04-12 DIAGNOSIS — I4819 Other persistent atrial fibrillation: Secondary | ICD-10-CM

## 2021-04-12 DIAGNOSIS — I10 Essential (primary) hypertension: Secondary | ICD-10-CM

## 2021-04-19 DIAGNOSIS — E782 Mixed hyperlipidemia: Secondary | ICD-10-CM | POA: Diagnosis not present

## 2021-04-19 DIAGNOSIS — I10 Essential (primary) hypertension: Secondary | ICD-10-CM | POA: Diagnosis not present

## 2021-04-21 ENCOUNTER — Encounter: Payer: Medicare HMO | Admitting: Adult Health

## 2021-04-21 NOTE — Progress Notes (Signed)
Botox- 100 units x 2 vials Lot: [id number] Expiration: 05/2023 NDC: 7425-5258-94  Bacteriostatic 0.9% Sodium Chloride- 52mL total Lot: [id number] Expiration: 04/20/2022 NDC: 3074-6002-98  Dx: O73.085 S/P [employer] MCR.

## 2021-04-26 NOTE — Progress Notes (Signed)
This encounter was created in error - please disregard.

## 2021-05-04 ENCOUNTER — Ambulatory Visit: Payer: Medicare HMO | Admitting: Adult Health

## 2021-05-10 ENCOUNTER — Ambulatory Visit: Payer: Medicare HMO | Admitting: Adult Health

## 2021-05-10 ENCOUNTER — Other Ambulatory Visit: Payer: Self-pay

## 2021-05-10 DIAGNOSIS — G43009 Migraine without aura, not intractable, without status migrainosus: Secondary | ICD-10-CM | POA: Diagnosis not present

## 2021-05-10 DIAGNOSIS — G43709 Chronic migraine without aura, not intractable, without status migrainosus: Secondary | ICD-10-CM

## 2021-05-10 NOTE — Progress Notes (Signed)
05/10/21: So far the patient has not noticed any improvement in her migraines since starting Botox.  After this injection cycle if she does not notice any improvement we will consider other treatment options.  She will let us know prior to her next Botox appointment   BOTOX PROCEDURE NOTE FOR MIGRAINE HEADACHE    Contraindications and precautions discussed with patient(above). Aseptic procedure was observed and patient tolerated procedure. Procedure performed by Ward Givens, NP  The condition has existed for more than 6 months, and pt does not have a diagnosis of ALS, Myasthenia Gravis or Lambert-Eaton Syndrome.  Risks and benefits of injections discussed and pt agrees to proceed with the procedure.  Written consent obtained  These injections are medically necessary. These injections do not cause sedations or hallucinations which the oral therapies may cause.  Indication/Diagnosis: chronic migraine BOTOX(J0585) injection was performed according to protocol by [employer]. 200 units of BOTOX was dissolved into 4 cc NS.   NDC: [address]-03  Type of toxin: Botox  Botox- 100 units x 2 vials Lot: [id number] Expiration: 05/2023 NDC: 0762-2633-35   Bacteriostatic 0.9% Sodium Chloride- 2mL total Lot: [id number] Expiration: 04/20/2022 NDC: 3893-7342-87   Dx: G81.157  Description of procedure:  The patient was placed in a sitting position. The standard protocol was used for Botox as follows, with 5 units of Botox injected at each site:   -Procerus muscle, midline injection  -Corrugator muscle, bilateral injection  -Frontalis muscle, bilateral injection, with 2 sites each side, medial injection was performed in the upper one third of the frontalis muscle, in the region vertical from the medial inferior edge of the superior orbital rim. The lateral injection was again in the upper one third of the forehead vertically above the lateral limbus of the cornea, 1.5 cm lateral to the medial  injection site.  -Temporalis muscle injection, 4 sites, bilaterally. The first injection was 3 cm above the tragus of the ear, second injection site was 1.5 cm to 3 cm up from the first injection site in line with the tragus of the ear. The third injection site was 1.5-3 cm forward between the first 2 injection sites. The fourth injection site was 1.5 cm posterior to the second injection site.  -Occipitalis muscle injection, 3 sites, bilaterally. The first injection was done one half way between the occipital protuberance and the tip of the mastoid process behind the ear. The second injection site was done lateral and superior to the first, 1 fingerbreadth from the first injection. The third injection site was 1 fingerbreadth superiorly and medially from the first injection site.  -Cervical paraspinal muscle injection, 2 sites, bilateral knee first injection site was 1 cm from the midline of the cervical spine, 3 cm inferior to the lower border of the occipital protuberance. The second injection site was 1.5 cm superiorly and laterally to the first injection site.  -Trapezius muscle injection was performed at 3 sites, bilaterally. The first injection site was in the upper trapezius muscle halfway between the inflection point of the neck, and the acromion. The second injection site was one half way between the acromion and the first injection site. The third injection was done between the first injection site and the inflection point of the neck.   Will return for repeat injection in 3 months.   A 200 unit sof Botox was used, 155 units were injected, the rest of the Botox was wasted. The patient tolerated the procedure well, there were no complications of the above  procedure.  Ward Givens, MSN, NP-C 05/10/2021, 2:48 PM [facility] [address] [place], [place] [address] [phone]

## 2021-05-10 NOTE — Progress Notes (Signed)
Botox consent signed  Botox- 100 units x 2 vials Lot: [id number] Expiration: 05/2023 NDC: 8102-5486-28  Bacteriostatic 0.9% Sodium Chloride- 52mL total Lot: [id number] Expiration: 04/20/2022 NDC: 0104-0459-13  Dx: W85.992 B/B

## 2021-06-04 ENCOUNTER — Other Ambulatory Visit: Payer: Self-pay | Admitting: Neurology

## 2021-06-04 DIAGNOSIS — G43009 Migraine without aura, not intractable, without status migrainosus: Secondary | ICD-10-CM

## 2021-06-06 ENCOUNTER — Other Ambulatory Visit: Payer: Self-pay

## 2021-06-06 ENCOUNTER — Ambulatory Visit: Payer: Medicare HMO | Admitting: Physician Assistant

## 2021-06-06 ENCOUNTER — Encounter: Payer: Self-pay | Admitting: Physician Assistant

## 2021-06-06 VITALS — BP 106/60 | HR 80 | Ht 67.0 in | Wt 159.0 lb

## 2021-06-06 DIAGNOSIS — I10 Essential (primary) hypertension: Secondary | ICD-10-CM

## 2021-06-06 DIAGNOSIS — I4819 Other persistent atrial fibrillation: Secondary | ICD-10-CM

## 2021-06-06 DIAGNOSIS — E785 Hyperlipidemia, unspecified: Secondary | ICD-10-CM | POA: Diagnosis not present

## 2021-06-06 DIAGNOSIS — R0609 Other forms of dyspnea: Secondary | ICD-10-CM

## 2021-06-06 DIAGNOSIS — I351 Nonrheumatic aortic (valve) insufficiency: Secondary | ICD-10-CM

## 2021-06-06 MED ORDER — DILTIAZEM HCL 30 MG PO TABS: ORAL_TABLET | ORAL | 3 refills | Status: DC

## 2021-06-06 NOTE — Progress Notes (Signed)
?Cardiology Office Note:   ? ?Date:  06/08/2021  ? ?ID:  Bridget [name], DOB 02/14/1941, MRN [id number] ? ?PCP:  Marylen Ponto, MD ?  ?[facility] Providers ?Cardiologist:  Chrystie Nose, MD    ? ?Referring MD: Marylen Ponto, MD  ? ?Chief Complaint  ?Patient presents with  ? Follow-up  ?  1 month.  ? Headache  ? Shortness of Breath  ? Edema  ?  Ankles.  ? ? ?History of Present Illness:   ? ?Bridget [name] is a 81 y.o. female with a hx of persistent atrial fibrillation/atrial flutter s/p ablation 11/2019 on Eliquis, tachycardia mediated cardiomyopathy with improved EF, hypertension, hyperlipidemia, hypothyroidism and GERD.  She was diagnosed with A-fib with RVR in February 2021 and was started on Eliquis and underwent TEE DCCV.  Unfortunately she had recurrence of atrial flutter requiring repeat DCCV a week later, however this did not hold either.  Echocardiogram shows EF was as low as 30 to 35% in the setting of atrial fibrillation with RVR.  She was seen by A-fib clinic and started on amiodarone but continued to have recurrent symptomatic A-fib/atrial flutter.  A repeat echocardiogram in March 2021 revealed EF had improved to 55 to 60%.  Patient ultimately underwent ablation in September 2021.  Due to recurrent short bursts of palpitation, she was prescribed PRN short acting diltiazem.  Patient was last seen by Marjie Skiff, PA-C on 02/28/2021 along with her daughter.  She has significant bilateral lower extremity edema and a 7 pound weight gain.  She was prescribed 40 mg daily of Lasix for 7 days then reduce to 40 mg daily as needed for weight gain and edema.  She was also prescribed potassium supplement on the days she takes the Lasix as well.  BNP was 56.2.  Echocardiogram obtained on 03/17/2021 showed EF 55 to 60%, no regional wall motion abnormality, RVSP 30.2 mmHg, trivial MR, moderate AI. ? ?Patient complains dyspnea on exertion for the past month.  She denies any chest pain.  She also complained of  leg edema.  However on physical exam, she only had 1+ pitting edema in the ankle and the foot but no leg edema.  I recommended 3 days of Lasix 40 mg along with potassium supplement before going back to as needed dose after that.  If her breathing improves, we will consider maybe putting her on a daily regimen of Lasix 20 mg daily.  Her EKG today shows new T wave inversion in lead I and aVL however patient denies any obvious chest discomfort.  If breathing does not improve, will consider Myoview on the next follow-up.  I did explain to her that she had a moderate AI, however given the moderate degree, I did not recommend a repeat echocardiogram at this time, may consider repeat echocardiogram in December 2023.  She will need a CBC, basic metabolic panel and a TSH to rule out other potential causes for dyspnea on exertion.  According to daughter, she is quite sedentary, she gets short of breath after walking more than 25 yards. ? ?Past Medical History:  ?Diagnosis Date  ? Allergy   ? Arthritis   ? hip OA  ? Degenerative disc disease   ? GERD (gastroesophageal reflux disease)   ? Hyperlipidemia   ? Hypertension   ? Hypothyroidism   ? Migraines   ? Neuromuscular disorder ([facility])   ? Neuropathy   ? Nonischemic cardiomyopathy ([facility])   ? 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% in  04/2019 but improved to 55-60% in 05/2019  ? Persistent atrial fibrillation/flutter   ? s/p atrial fibrillation abletion in 11/2019  ? Seasonal allergies   ? 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 ? ?Past Surgical History:  ?Procedure Laterality Date  ? ATRIAL FIBRILLATION ABLATION N/A 12/16/2019  ? Procedure: ATRIAL FIBRILLATION ABLATION;  Surgeon: Hillis Range, MD;  Location: [facility];  Service: Cardiovascular;  Laterality: N/A;  ? CARDIOVERSION N/A 05/02/2019  ? Procedure: CARDIOVERSION;  Surgeon: Chrystie Nose, MD;  Location: [facility] ENDOSCOPY;  Service: Cardiovascular;  Laterality: N/A;  ? CARDIOVERSION N/A 05/14/2019  ? Procedure:  CARDIOVERSION;  Surgeon: Parke Poisson, MD;  Location: [facility] ENDOSCOPY;  Service: Cardiovascular;  Laterality: N/A;  ? CATARACT EXTRACTION, BILATERAL    ? CERVICAL DISCECTOMY  2005  ? with fusion  ? CHOLECYSTECTOMY  2006  ? COLONOSCOPY    ? LUMBAR LAMINECTOMY/DECOMPRESSION MICRODISCECTOMY Right 11/13/2014  ? Procedure: Right Lumbar four-five Microdiskectomy;  Surgeon: Hilda Lias, MD;  Location: [facility]UR[facility]S;  Service: Neurosurgery;  Laterality: Right;  right  ? POLYPECTOMY    ? REPLACEMENT TOTAL KNEE Right 05/2012  ? TEE WITHOUT CARDIOVERSION N/A 05/02/2019  ? Procedure: TRANSESOPHAGEAL ECHOCARDIOGRAM (TEE);  Surgeon: Chrystie Nose, MD;  Location: [facility] ENDOSCOPY;  Service: Cardiovascular;  Laterality: N/A;  ? TOTAL ABDOMINAL HYSTERECTOMY  1978  ? TOTAL HIP ARTHROPLASTY Left 04/2011  ? TRANSTHORACIC ECHOCARDIOGRAM  12/2008  ? EF=>55%, borderline conc LVH; trace MR; mod TR; trace AV regurg  ? ? ?Current Medications: ?Current Meds  ?Medication Sig  ? apixaban (ELIQUIS) 5 MG TABS tablet Take 1 tablet (5 mg total) by mouth 2 (two) times daily.  ? b complex vitamins tablet Take 1 tablet by mouth daily. With vitamin c 500 mg  ? baclofen (LIORESAL) 10 MG tablet Take 0.5 tablets by mouth as needed (as needed for migraine pain and neuropathy.).   ? Cholecalciferol 25 MCG (1000 UT) tablet Take 1,000 Units by mouth daily.   ? Dextran 70-Hypromellose (ARTIFICIAL TEARS PF OP) Place 1 drop into both eyes daily as needed (For dry eyes).  ? furosemide (LASIX) 20 MG tablet Take 2 tablets (40 mg total) by mouth daily as needed for edema (weight gain and/or swelling).  ? gabapentin (NEURONTIN) 600 MG tablet Take 300 mg by mouth at bedtime.  ? irbesartan (AVAPRO) 75 MG tablet Take 1 tablet (75 mg total) by mouth daily.  ? levothyroxine (SYNTHROID, LEVOTHROID) 75 MCG tablet Take 75 mcg by mouth daily before breakfast.  ? potassium chloride SA (KLOR-CON M20) 20 MEQ tablet TAKE 1 TABLET (20 MEQ TOTAL) BY MOUTH AS NEEDED (TAKE ON DAYS  LASIX IS TAKEN).  ? promethazine (PHENERGAN) 25 MG tablet Take 25 mg by mouth every 6 (six) hours as needed for nausea.  ? sertraline (ZOLOFT) 25 MG tablet Take 25 mg by mouth daily.  ? traMADol (ULTRAM) 50 MG tablet Take 50-100 mg by mouth every 4 (four) hours as needed for moderate pain (as needed for migraine pain and neuropathy.).   ? zinc gluconate 50 MG tablet Take 50 mg by mouth daily.  ? [DISCONTINUED] diltiazem (CARDIZEM) 30 MG tablet 2 tablets every 6 hours for palpitations  ? [DISCONTINUED] rosuvastatin (CRESTOR) 20 MG tablet TAKE 1 TABLET BY MOUTH DAILY AT 6 PM.  ? [DISCONTINUED] Ubrogepant (UBRELVY) 100 MG TABS Take 100 mg by mouth every 2 (two) hours as needed (migraine.). Maximum 200mg  a day.  ?  ? ?Allergies:   Codeine,  Sulfonamide derivatives, Triptans, Amlodipine, Chocolate, Onion, and Other  ? ?Social History  ? ?Socioeconomic History  ? Marital status: Married  ?  Spouse name: Not on file  ? Number of children: 2  ? Years of education: 37  ? Highest education level: Not on file  ?Occupational History  ? 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 Smoking status: Never  ? Smokeless tobacco: Never  ?Vaping Use  ? Vaping Use: Never used  ?Substance and Sexual Activity  ? Alcohol use: No  ? Drug use: No  ? Sexual activity: Not on file  ?Other Topics Concern  ? Not on file  ?Social History Narrative  ? Lives at home with husband  ? Right handed  ? Caffeine: maybe 1 cup/day  ? ?Social Determinants of Health  ? ?Financial Resource Strain: Not on file  ?Food Insecurity: Not on file  ?Transportation Needs: Not on file  ?Physical Activity: Not on file  ?Stress: Not on file  ?Social Connections: Not on file  ?  ? ?Family History: ?The patient's family history includes Breast cancer in her paternal grandmother; Colon cancer in her cousin; Colon polyps in her father; Coronary artery disease (age of onset: 68) in her father; Esophageal cancer in her cousin; Stroke in her maternal  grandfather and paternal grandmother; Stroke (age of onset: 64) in her mother; Suicidality in her daughter. There is no history of Rectal cancer, Stomach cancer, Tremor, Parkinson's disease, Alzheimer's di

## 2021-06-06 NOTE — Patient Instructions (Signed)
Medication Instructions:  ?TAKE Lasix 40 mg daily for 3 days then resume taking As Needed  ?TAKE Potassium 20 meq daily for 3 days then resume taking as needed  ?*If you need a refill on your cardiac medications before your next appointment, please call your pharmacy* ? ?Lab Work: ?Your physician recommends that you return for lab work in [place]:  ?BMET ?[place] ?TSH ?If you have labs (blood work) drawn today and your tests are completely normal, you will receive your results only by: ?MyChart Message (if you have MyChart) OR ?A paper copy in the mail ?If you have any lab test that is abnormal or we need to change your treatment, we will call you to review the results. ? ?Testing/Procedures: ?NONE ordered at this time of appointment  ? ?Follow-Up: ?At [facility], you and your health needs are our priority.  As part of our continuing mission to provide you with exceptional heart care, we have created designated Provider Care Teams.  These Care Teams include your primary Cardiologist (physician) and Advanced Practice Providers (APPs -  Physician Assistants and Nurse Practitioners) who all work together to provide you with the care you need, when you need it. ? ?Your next appointment:   ?2 week(s) ? ?The format for your next appointment:   ?In Person ? ?Provider:   ?Sande Rives, PA-C or Almyra Deforest, PA-C      ? ?Other Instructions ? ? ?

## 2021-06-07 LAB — CBC
Hematocrit: 40 % (ref 34.0–46.6)
Hemoglobin: 13.3 g/dL (ref 11.1–15.9)
MCH: 29.2 pg (ref 26.6–33.0)
MCHC: 33.3 g/dL (ref 31.5–35.7)
MCV: 88 fL (ref 79–97)
Platelets: 166 10*3/uL (ref 150–450)
RBC: 4.55 x10E6/uL (ref 3.77–5.28)
RDW: 12.5 % (ref 11.7–15.4)
WBC: 10.6 10*3/uL (ref 3.4–10.8)

## 2021-06-07 LAB — BASIC METABOLIC PANEL
BUN/Creatinine Ratio: 14 (ref 12–28)
BUN: 21 mg/dL (ref 8–27)
CO2: 25 mmol/L (ref 20–29)
Calcium: 10.3 mg/dL (ref 8.7–10.3)
Chloride: 107 mmol/L — ABNORMAL HIGH (ref 96–106)
Creatinine, Ser: 1.46 mg/dL — ABNORMAL HIGH (ref 0.57–1.00)
Glucose: 101 mg/dL — ABNORMAL HIGH (ref 70–99)
Potassium: 4.8 mmol/L (ref 3.5–5.2)
Sodium: 144 mmol/L (ref 134–144)
eGFR: 36 mL/min/{1.73_m2} — ABNORMAL LOW (ref >59)

## 2021-06-07 LAB — TSH: TSH: 2.66 u[IU]/mL (ref 0.450–4.500)

## 2021-06-08 ENCOUNTER — Other Ambulatory Visit: Payer: Self-pay | Admitting: Internal Medicine

## 2021-06-10 NOTE — Progress Notes (Signed)
Normal red blood cell count. Stable renal function and electrolyte

## 2021-06-11 NOTE — Progress Notes (Signed)
?Cardiology Office Note:   ? ?Date:  06/22/2021  ? ?ID:  Bridget [name], DOB 1940/08/17, MRN [id number] ? ?PCP:  Marylen Ponto, MD  ?Cardiologist:  Chrystie Nose, MD  ?Electrophysiologist:  None  ? ?Referring MD: Marylen Ponto, MD  ? ?Chief Complaint: follow-up of CHF ? ?History of Present Illness:   ? ?Bridget [name] is a 81 y.o. female with a history of a history of persistent atrial fibrillation/flutter s/p ablation in 11/2019 on Eliquis, tachymediated cardiomyopathy / chronic combined CHF with EF as low as 30-35% in 04/2019 with normalization of EF, moderate AI, hypertension, hyperlipidemia, hypothyroidism, and GERD who is followed by Dr. Rennis Golden and presents today for follow-up of CHF. ?  ?Patient was diagnosed with atrial flutter with RVR in 04/2019. She was started on Eliquis and underwent TEE/DCCV which initially successful but she had recurrent atrial flutter requiring repeat DCCV one week later which was again only temporary. She was also found to have reduced EF of 30-35% on TEE which was felt to be tachy-mediated related. She was referred to the [facility] and was started on Amiodarone but continued to have recurrent symptomatic atrial fibrillation/flutter. Repeat Echo in 05/2019 showed normalization of EF to 55-60%. She was seen by Dr. Johney Frame and decision was bade to proceed with atrial fibrillation ablation which was done in 11/2019. She has done well since then and was able to stop Amiodarone.  ? ?At office with me in 02/2021, patient reported dyspnea on exertion and significant lower extremity edema for the past 3 weeks. Lasix was increased to 40mg  daily for 1 week and then patient was instructed to decrease to 40mg  as needed for weight gain and edema. BNP came back normal. 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  ? ?He was recently seen by Azalee Course, PA-C, on  06/06/2021 at which time she reported dyspnea on exertion and pedal/ankle edema over the last month. EKG showed new T wave inversions in leads I and AVL but patient denied any chest pain. Lasix was increased for 3 days and close follow-up was arranged with plans to order Myoview if dyspnea does not improve.  ? ?Patient present today for follow-up. Here with daughter. Patient reports she is doing better since last visit. Her shortness of breath and lower extremity edema improved with 3 days of Lasix. She denies any shortness of breath, orthopnea, or PND. She has just very mild swelling around her ankles but much improved from before. No chest pain. She notes mild brief dizziness with standing but no palpitations, falls, or syncope. No abnormal bleeding on Eliquis. ? ?Past Medical History:  ?Diagnosis Date  ? Allergy   ? Arthritis   ? hip OA  ? Degenerative disc disease   ? GERD (gastroesophageal reflux disease)   ? Hyperlipidemia   ? Hypertension   ? Hypothyroidism   ? Migraines   ? Neuromuscular disorder (HCC)   ? Neuropathy   ? Nonischemic cardiomyopathy (HCC)   ? 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 but improved to 55-60% in 05/2019  ? Persistent atrial fibrillation/flutter   ? s/p atrial fibrillation abletion in 11/2019  ? Seasonal allergies   ? 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 ? ?Past Surgical History:  ?Procedure Laterality Date  ? ATRIAL FIBRILLATION ABLATION N/A 12/16/2019  ? Procedure: ATRIAL FIBRILLATION ABLATION;  Surgeon:  Hillis Range, MD;  Location: [facility] INVASIVE CV LAB;  Service: Cardiovascular;  Laterality: N/A;  ? CARDIOVERSION N/A 05/02/2019  ? Procedure: CARDIOVERSION;  Surgeon: Chrystie Nose, MD;  Location: [facility] ENDOSCOPY;  Service: Cardiovascular;  Laterality: N/A;  ? CARDIOVERSION N/A 05/14/2019  ? Procedure: CARDIOVERSION;  Surgeon: Parke Poisson, MD;  Location: [facility] ENDOSCOPY;  Service: Cardiovascular;  Laterality: N/A;  ? CATARACT EXTRACTION, BILATERAL    ? CERVICAL  DISCECTOMY  2005  ? with fusion  ? CHOLECYSTECTOMY  2006  ? COLONOSCOPY    ? LUMBAR LAMINECTOMY/DECOMPRESSION MICRODISCECTOMY Right 11/13/2014  ? Procedure: Right Lumbar four-five Microdiskectomy;  Surgeon: Hilda Lias, MD;  Location: [facility] NEURO ORS;  Service: Neurosurgery;  Laterality: Right;  right  ? POLYPECTOMY    ? REPLACEMENT TOTAL KNEE Right 05/2012  ? TEE WITHOUT CARDIOVERSION N/A 05/02/2019  ? Procedure: TRANSESOPHAGEAL ECHOCARDIOGRAM (TEE);  Surgeon: Chrystie Nose, MD;  Location: [facility] ENDOSCOPY;  Service: Cardiovascular;  Laterality: N/A;  ? TOTAL ABDOMINAL HYSTERECTOMY  1978  ? TOTAL HIP ARTHROPLASTY Left 04/2011  ? TRANSTHORACIC ECHOCARDIOGRAM  12/2008  ? EF=>55%, borderline conc LVH; trace MR; mod TR; trace AV regurg  ? ? ?Current Medications: ?Current Meds  ?Medication Sig  ? b complex vitamins tablet Take 1 tablet by mouth daily. With vitamin c 500 mg  ? baclofen (LIORESAL) 10 MG tablet Take 0.5 tablets by mouth as needed (as needed for migraine pain and neuropathy.).   ? Cholecalciferol 25 MCG (1000 UT) tablet Take 1,000 Units by mouth daily.   ? Dextran 70-Hypromellose (ARTIFICIAL TEARS PF OP) Place 1 drop into both eyes daily as needed (For dry eyes).  ? diltiazem (CARDIZEM) 30 MG tablet 2 tablets every 6 hours for palpitations  ? ELIQUIS 5 MG TABS tablet TAKE 1 TABLET BY MOUTH TWICE A DAY  ? furosemide (LASIX) 20 MG tablet Take 2 tablets (40 mg total) by mouth daily as needed for edema (weight gain and/or swelling).  ? gabapentin (NEURONTIN) 600 MG tablet Take 300 mg by mouth at bedtime.  ? irbesartan (AVAPRO) 75 MG tablet Take 1 tablet (75 mg total) by mouth daily.  ? levothyroxine (SYNTHROID, LEVOTHROID) 75 MCG tablet Take 75 mcg by mouth daily before breakfast.  ? potassium chloride SA (KLOR-CON M20) 20 MEQ tablet TAKE 1 TABLET (20 MEQ TOTAL) BY MOUTH AS NEEDED (TAKE ON DAYS LASIX IS TAKEN).  ? promethazine (PHENERGAN) 25 MG tablet Take 25 mg by mouth every 6 (six) hours as needed for nausea.  ?  rosuvastatin (CRESTOR) 20 MG tablet TAKE 1 TABLET BY MOUTH DAILY AT 6 PM.  ? sertraline (ZOLOFT) 25 MG tablet Take 25 mg by mouth daily.  ? traMADol (ULTRAM) 50 MG tablet Take 50-100 mg by mouth every 4 (four) hours as needed for moderate pain (as needed for migraine pain and neuropathy.).   ? UBRELVY 100 MG TABS TAKE 100 MG BY MOUTH EVERY 2 (TWO) HOURS AS NEEDED (MIGRAINE.). MAXIMUM 200MG  A DAY.  ? zinc gluconate 50 MG tablet Take 50 mg by mouth daily.  ?  ? ?Allergies:   Codeine, Sulfonamide derivatives, Triptans, Amlodipine, Chocolate, Onion, and Other  ? ?Social History  ? ?Socioeconomic History  ? Marital status: Married  ?  Spouse name: Not on file  ? Number of children: 2  ? Years of education: 23  ? Highest education level: Not on file  ?Occupational History  ? 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 Smoking status:  Never  ? Smokeless tobacco: Never  ?Vaping Use  ? Vaping Use: Never used  ?Substance and Sexual Activity  ? Alcohol use: No  ? Drug use: No  ? Sexual activity: Not on file  ?Other Topics Concern  ? Not on file  ?Social History Narrative  ? Lives at home with husband  ? Right handed  ? Caffeine: maybe 1 cup/day  ? ?Social Determinants of Health  ? ?Financial Resource Strain: Not on file  ?Food Insecurity: Not on file  ?Transportation Needs: Not on file  ?Physical Activity: Not on file  ?Stress: Not on file  ?Social Connections: Not on file  ?  ? ?Family History: ?The patient's family history includes Breast cancer in her paternal grandmother; Colon cancer in her cousin; Colon polyps in her father; Coronary artery disease (age of onset: 45) in her father; Esophageal cancer in her cousin; Stroke in her maternal grandfather and paternal grandmother; Stroke (age of onset: 1) in her mother; Suicidality in her daughter. There is no history of Rectal cancer, Stomach cancer, Tremor, Parkinson's disease, Alzheimer's disease, or Dementia. ? ?ROS:   ?Please see the history of  present illness.    ? ?EKGs/Labs/Other Studies Reviewed:   ? ?The following studies were reviewed today: ? ?Echocardiogram 03/17/2021: ?Impressions; ? 1. Left ventricular ejection fraction, by estimation, is 5

## 2021-06-16 ENCOUNTER — Other Ambulatory Visit: Payer: Self-pay | Admitting: Internal Medicine

## 2021-06-16 NOTE — Telephone Encounter (Signed)
Prescription refill request for Eliquis received. ?Indication: Afib  ?Last office visit: 06/06/21 Bridget [name])  ?Scr: 1.46 (06/06/21) ?Age: 81 ?Weight: 72.1kg ? ?Appropriate dose and refill sent to requested pharmacy.  ?

## 2021-06-22 ENCOUNTER — Ambulatory Visit: Payer: Medicare HMO | Admitting: Student

## 2021-06-22 ENCOUNTER — Encounter: Payer: Self-pay | Admitting: Student

## 2021-06-22 VITALS — BP 103/74 | HR 74 | Ht 67.0 in | Wt 162.2 lb

## 2021-06-22 DIAGNOSIS — E785 Hyperlipidemia, unspecified: Secondary | ICD-10-CM | POA: Diagnosis not present

## 2021-06-22 DIAGNOSIS — I4819 Other persistent atrial fibrillation: Secondary | ICD-10-CM

## 2021-06-22 DIAGNOSIS — I10 Essential (primary) hypertension: Secondary | ICD-10-CM

## 2021-06-22 DIAGNOSIS — I77810 Thoracic aortic ectasia: Secondary | ICD-10-CM | POA: Diagnosis not present

## 2021-06-22 DIAGNOSIS — I428 Other cardiomyopathies: Secondary | ICD-10-CM | POA: Diagnosis not present

## 2021-06-22 DIAGNOSIS — I351 Nonrheumatic aortic (valve) insufficiency: Secondary | ICD-10-CM | POA: Diagnosis not present

## 2021-06-22 DIAGNOSIS — I5042 Chronic combined systolic (congestive) and diastolic (congestive) heart failure: Secondary | ICD-10-CM | POA: Diagnosis not present

## 2021-06-22 NOTE — Patient Instructions (Signed)
Medication Instructions:  ?No Changes ?*If you need a refill on your cardiac medications before your next appointment, please call your pharmacy* ? ? ?Lab Work: ?No Labs ?If you have labs (blood work) drawn today and your tests are completely normal, you will receive your results only by: ?MyChart Message (if you have MyChart) OR ?A paper copy in the mail ?If you have any lab test that is abnormal or we need to change your treatment, we will call you to review the results. ? ? ?Testing/Procedures: ?No Testing ? ? ?Follow-Up: ?At [facility], you and your health needs are our priority.  As part of our continuing mission to provide you with exceptional heart care, we have created designated Provider Care Teams.  These Care Teams include your primary Cardiologist (physician) and Advanced Practice Providers (APPs -  Physician Assistants and Nurse Practitioners) who all work together to provide you with the care you need, when you need it. ? ?We recommend signing up for the patient portal called "MyChart".  Sign up information is provided on this After Visit Summary.  MyChart is used to connect with patients for Virtual Visits (Telemedicine).  Patients are able to view lab/test results, encounter notes, upcoming appointments, etc.  Non-urgent messages can be sent to your provider as well.   ?To learn more about what you can do with MyChart, go to [email or web address].   ? ?Your next appointment:   ?6 month(s) ? ?The format for your next appointment:   ?In Person ? ?Provider:   ?Pixie Casino, MD   ? ? ?Other Instructions ? Heart Failure Education: ? ?Weigh yourself EVERY morning after you go to the bathroom but before you eat or drink anything. Write this number down in a weight log/diary. If you gain 3 pounds overnight or 5 pounds in a week, call the office. ?Take your medicines as prescribed. If you have concerns about your medications, please call us before you stop taking them. ?Eat low salt foods--Limit salt  (sodium) to 2000 mg per day. This will help prevent your body from holding onto fluid. Read food labels as many processed foods have a lot of sodium, especially canned goods and prepackaged meats. If you would like some assistance choosing low sodium foods, we would be happy to set you up with a nutritionist. ?Limit all fluids for the day to less than 2 liters (64 ounces). Fluid includes all drinks, coffee, juice, ice chips, soup, jello, and all other liquids. ?Stay as active as you can everyday. Staying active will give you more energy and make your muscles stronger. Start with 5 minutes at a time and work your way up to 30 minutes a day. Break up your activities--do some in the morning and some in the afternoon. Start with 3 days per week and work your way up to 5 days as you can.  If you have chest pain, feel short of breath, dizzy, or lightheaded, STOP. If you don't feel better after a short rest, call 911. If you do feel better, call the office to let us know you have symptoms with exercise. ?  ?

## 2021-07-14 ENCOUNTER — Telehealth: Payer: Self-pay | Admitting: Adult Health

## 2021-07-14 NOTE — Telephone Encounter (Signed)
Completed Botox PA form, placed in Nurse Pod for NP signature. ?

## 2021-07-19 NOTE — Telephone Encounter (Signed)
Faxed signed PA form with OV notes to [employer].  

## 2021-07-20 ENCOUNTER — Ambulatory Visit: Payer: Medicare HMO | Admitting: Adult Health

## 2021-07-20 DIAGNOSIS — G8929 Other chronic pain: Secondary | ICD-10-CM | POA: Diagnosis not present

## 2021-07-20 DIAGNOSIS — G629 Polyneuropathy, unspecified: Secondary | ICD-10-CM | POA: Diagnosis not present

## 2021-07-20 DIAGNOSIS — R519 Headache, unspecified: Secondary | ICD-10-CM | POA: Diagnosis not present

## 2021-07-20 NOTE — Telephone Encounter (Signed)
Received approval from [facility],  [place] # [id number] (07/19/2021-07/20/2022). ?

## 2021-08-02 ENCOUNTER — Ambulatory Visit: Payer: Medicare HMO | Admitting: Adult Health

## 2021-08-02 DIAGNOSIS — G43709 Chronic migraine without aura, not intractable, without status migrainosus: Secondary | ICD-10-CM | POA: Diagnosis not present

## 2021-08-02 DIAGNOSIS — G43009 Migraine without aura, not intractable, without status migrainosus: Secondary | ICD-10-CM | POA: Diagnosis not present

## 2021-08-02 NOTE — Progress Notes (Signed)
? ?08/02/21: having at least one a week. Feels that they are better. Severity has lessened.  ? ?05/10/21: So far the patient has not noticed any improvement in her migraines since starting Botox.  After this injection cycle if she does not notice any improvement we will consider other treatment options.  She will let us know prior to her next Botox appointment ? ? ?BOTOX PROCEDURE NOTE FOR MIGRAINE HEADACHE ? ? ? ?Contraindications and precautions discussed with patient(above). Aseptic procedure was observed and patient tolerated procedure. Procedure performed by Ward Givens, NP ? ?The condition has existed for more than 6 months, and pt does not have a diagnosis of ALS, Myasthenia Gravis or Lambert-Eaton Syndrome.  Risks and benefits of injections discussed and pt agrees to proceed with the procedure.  Written consent obtained ? ?These injections are medically necessary. These injections do not cause sedations or hallucinations which the oral therapies may cause. ? ?Indication/Diagnosis: chronic migraine ?TGGYI(R4854) injection was performed according to protocol by [employer]. 200 units of BOTOX was dissolved into 4 cc NS.   ?[place]: [id number] ? ?Type of toxin: Botox ? ?Botox- 100 units x 2 vials ?Lot: [id number] ?Expiration: 07/2023 ?[place]: [phone] ?  ?Bacteriostatic 0.9% Sodium Chloride- 81m total ?Lot: [id number]?Expiration: 10/19/2022 ?[place] [address]-42?  ?Dx: GP53.614? ?Description of procedure: ? ?The patient was placed in a sitting position. The standard protocol was used for Botox as follows, with 5 units of Botox injected at each site: ? ? ?-Procerus muscle, midline injection ? ?-Corrugator muscle, bilateral injection ? ?-Frontalis muscle, bilateral injection, with 2 sites each side, medial injection was performed in the upper one third of the frontalis muscle, in the region vertical from the medial inferior edge of the superior orbital rim. The lateral injection was again in the upper one third of the  forehead vertically above the lateral limbus of the cornea, 1.5 cm lateral to the medial injection site. ? ?-Temporalis muscle injection, 4 sites, bilaterally. The first injection was 3 cm above the tragus of the ear, second injection site was 1.5 cm to 3 cm up from the first injection site in line with the tragus of the ear. The third injection site was 1.5-3 cm forward between the first 2 injection sites. The fourth injection site was 1.5 cm posterior to the second injection site. ? ?-Occipitalis muscle injection, 3 sites, bilaterally. The first injection was done one half way between the occipital protuberance and the tip of the mastoid process behind the ear. The second injection site was done lateral and superior to the first, 1 fingerbreadth from the first injection. The third injection site was 1 fingerbreadth superiorly and medially from the first injection site. ? ?-Cervical paraspinal muscle injection, 2 sites, bilateral knee first injection site was 1 cm from the midline of the cervical spine, 3 cm inferior to the lower border of the occipital protuberance. The second injection site was 1.5 cm superiorly and laterally to the first injection site. ? ?-Trapezius muscle injection was performed at 3 sites, bilaterally. The first injection site was in the upper trapezius muscle halfway between the inflection point of the neck, and the acromion. The second injection site was one half way between the acromion and the first injection site. The third injection was done between the first injection site and the inflection point of the neck. ? ? ?Will return for repeat injection in 3 months. ? ? ?A 200 unit sof Botox was used, 155 units were injected, the rest of the  Botox was wasted. The patient tolerated the procedure well, there were no complications of the above procedure. ? ?Ward Givens, MSN, NP-C 08/02/2021, 2:37 PM ?[facility] ?[address], [address] ?[place], [place] [address] ?[phone] ?

## 2021-08-02 NOTE — Progress Notes (Signed)
Botox consent signed ? ?Botox- 100 units x 2 vials ?Lot: [id number] ?Expiration: 07/2023 ?[place]: [phone] ? ?Bacteriostatic 0.9% Sodium Chloride- 65m total ?Lot: [id number]?Expiration: 10/19/2022 ?[place] [id number]? ?Dx: GW86.168?B/B ? ?

## 2021-08-17 ENCOUNTER — Other Ambulatory Visit: Payer: Self-pay | Admitting: Internal Medicine

## 2021-09-02 ENCOUNTER — Other Ambulatory Visit: Payer: Self-pay | Admitting: Physician Assistant

## 2021-09-26 DIAGNOSIS — Z1331 Encounter for screening for depression: Secondary | ICD-10-CM | POA: Diagnosis not present

## 2021-09-26 DIAGNOSIS — E039 Hypothyroidism, unspecified: Secondary | ICD-10-CM | POA: Diagnosis not present

## 2021-09-26 DIAGNOSIS — Z6824 Body mass index (BMI) 24.0-24.9, adult: Secondary | ICD-10-CM | POA: Diagnosis not present

## 2021-09-26 DIAGNOSIS — R300 Dysuria: Secondary | ICD-10-CM | POA: Diagnosis not present

## 2021-09-26 DIAGNOSIS — Z0000 Encounter for general adult medical examination without abnormal findings: Secondary | ICD-10-CM | POA: Diagnosis not present

## 2021-09-26 DIAGNOSIS — Z79899 Other long term (current) drug therapy: Secondary | ICD-10-CM | POA: Diagnosis not present

## 2021-10-27 ENCOUNTER — Ambulatory Visit (INDEPENDENT_AMBULATORY_CARE_PROVIDER_SITE_OTHER): Payer: Medicare HMO | Admitting: Adult Health

## 2021-10-27 DIAGNOSIS — G43709 Chronic migraine without aura, not intractable, without status migrainosus: Secondary | ICD-10-CM | POA: Diagnosis not present

## 2021-10-27 DIAGNOSIS — G43009 Migraine without aura, not intractable, without status migrainosus: Secondary | ICD-10-CM

## 2021-10-27 MED ORDER — ONABOTULINUMTOXINA 200 UNITS IJ SOLR
155.0000 [IU] | Freq: Once | INTRAMUSCULAR | Status: AC
  Administered 2021-10-27: 155 [IU] via INTRAMUSCULAR

## 2021-10-27 NOTE — Progress Notes (Signed)
Botox- 200 units x 1 vial Lot: [id number] Expiration: 04/2024 N[facility]: 8592-7639-43  Bacteriostatic 0.9% Sodium Chloride- 42m total Lot: [id number]: 10/19/2022 N[place]: [address]-22 Dx: GU41.146B/B

## 2021-10-27 NOTE — Progress Notes (Signed)
10/27/21: Having a about 1 headache a week. Botox is working well.   08/02/21: having at least one a week. Feels that they are better. Severity has lessened.   05/10/21: So far the patient has not noticed any improvement in her migraines since starting Botox.  After this injection cycle if she does not notice any improvement we will consider other treatment options.  She will let us know prior to her next Botox appointment   BOTOX PROCEDURE NOTE FOR MIGRAINE HEADACHE    Contraindications and precautions discussed with patient(above). Aseptic procedure was observed and patient tolerated procedure. Procedure performed by Ward Givens, NP  The condition has existed for more than 6 months, and pt does not have a diagnosis of ALS, Myasthenia Gravis or Lambert-Eaton Syndrome.  Risks and benefits of injections discussed and pt agrees to proceed with the procedure.  Written consent obtained  These injections are medically necessary. These injections do not cause sedations or hallucinations which the oral therapies may cause.  Indication/Diagnosis: chronic migraine BOTOX(J0585) injection was performed according to protocol by [employer]er[employer]. 200 units of BOTOX was dissolved into 4 cc NS.   [facility]: [address]-42  Botox- 200 units x 1 vial Lot: [id number] Expiration: 04/2024 NDC: 1540-0867-61   Bacteriostatic 0.9% Sodium Chloride- 84m total Lot: [id number]: 10/19/2022 NDC: [address]-99  Dx: GI33.825 Description of procedure:  The patient was placed in a sitting position. The standard protocol was used for Botox as follows, with 5 units of Botox injected at each site:   -Procerus muscle, midline injection  -Corrugator muscle, bilateral injection  -Frontalis muscle, bilateral injection, with 2 sites each side, medial injection was performed in the upper one third of the frontalis muscle, in the region vertical from the medial inferior edge of the superior orbital rim. The lateral  injection was again in the upper one third of the forehead vertically above the lateral limbus of the cornea, 1.5 cm lateral to the medial injection site.  -Temporalis muscle injection, 4 sites, bilaterally. The first injection was 3 cm above the tragus of the ear, second injection site was 1.5 cm to 3 cm up from the first injection site in line with the tragus of the ear. The third injection site was 1.5-3 cm forward between the first 2 injection sites. The fourth injection site was 1.5 cm posterior to the second injection site.  -Occipitalis muscle injection, 3 sites, bilaterally. The first injection was done one half way between the occipital protuberance and the tip of the mastoid process behind the ear. The second injection site was done lateral and superior to the first, 1 fingerbreadth from the first injection. The third injection site was 1 fingerbreadth superiorly and medially from the first injection site.  -Cervical paraspinal muscle injection, 2 sites, bilateral knee first injection site was 1 cm from the midline of the cervical spine, 3 cm inferior to the lower border of the occipital protuberance. The second injection site was 1.5 cm superiorly and laterally to the first injection site.  -Trapezius muscle injection was performed at 3 sites, bilaterally. The first injection site was in the upper trapezius muscle halfway between the inflection point of the neck, and the acromion. The second injection site was one half way between the acromion and the first injection site. The third injection was done between the first injection site and the inflection point of the neck.   Will return for repeat injection in 3 months.   A 200 units of  Botox was used, 155 units were injected, the rest of the Botox was wasted. The patient tolerated the procedure well, there were no complications of the above procedure.  Ward Givens, MSN, NP-C 10/27/2021, 3:10 PM [facility] [address], [place], [place] [address] [phone]

## 2021-11-30 DIAGNOSIS — G629 Polyneuropathy, unspecified: Secondary | ICD-10-CM | POA: Diagnosis not present

## 2021-12-01 ENCOUNTER — Telehealth: Payer: Self-pay | Admitting: Internal Medicine

## 2021-12-01 NOTE — Telephone Encounter (Signed)
STAT if HR is under 50 or over 120 (normal HR is 60-100 beats per minute)  What is your heart rate? 144; 146  Do you have a log of your heart rate readings (document readings)?    Do you have any other symptoms? no

## 2021-12-01 NOTE — Telephone Encounter (Signed)
Is she having any symptoms with this (lightheadedness, dizziness, near syncope, new shortness of breath, chest pain, etc)? What is her BP? If she is having any symptoms and her heart rates are in the 140s, she needs to go to the ED. Even if she is not having any symptoms, if heart rates are not improving with her PRN Cardizem, she probably still needs to go to the ED. I worry she will not tolerate that rate for long. She has a history of tachy-mediated cardiomyopathy. If she does not go to the ED, she needs a close follow-up visit with any APP or DOD tomorrow.   Darreld Mclean, PA-C 12/01/2021 3:08 PM

## 2021-12-01 NOTE — Telephone Encounter (Signed)
Called pt's daughter, Leafy Ro. She states the pt went for an appointment they took her pulse and it was in the 140s. "We have been taking it at home since than and we can not get it down. She had an ablation before but she was doing good. Is there anything we should be doing Her heart rate have even gotten up to 149?" She states pt is taking her medications as prescribed.  Will get message to Dr. Rayann Heman and [facility] for review.   Tried to schedule pt for her 6 month follow up was unable to do so. Will reach out to scheduling to get this done.

## 2021-12-01 NOTE — Telephone Encounter (Signed)
Spoke with pt's daughter she states her mother is not having any symptoms. She is unable to tell me exact blood pressure readings, she states "but it was good." She states she thought about using Cardizem last night but was not sure since her mom was not having any symptoms. Advised her to try the Cardizem, make note of the time it was taken, check her heart rate and blood pressure at the 6 hour make. Attempted to add her to an APP or DOD no open slots available. Will get message to [facility] and Dr. Debara Pickett for review.

## 2021-12-02 ENCOUNTER — Other Ambulatory Visit: Payer: Self-pay | Admitting: Adult Health

## 2021-12-02 ENCOUNTER — Ambulatory Visit: Payer: Medicare HMO | Attending: Adult Health | Admitting: Adult Health

## 2021-12-02 ENCOUNTER — Encounter: Payer: Self-pay | Admitting: Adult Health

## 2021-12-02 VITALS — BP 130/68 | HR 130 | Ht 67.0 in | Wt 163.6 lb

## 2021-12-02 DIAGNOSIS — I10 Essential (primary) hypertension: Secondary | ICD-10-CM | POA: Diagnosis not present

## 2021-12-02 DIAGNOSIS — I4819 Other persistent atrial fibrillation: Secondary | ICD-10-CM | POA: Diagnosis not present

## 2021-12-02 DIAGNOSIS — E038 Other specified hypothyroidism: Secondary | ICD-10-CM

## 2021-12-02 DIAGNOSIS — I480 Paroxysmal atrial fibrillation: Secondary | ICD-10-CM

## 2021-12-02 DIAGNOSIS — E7800 Pure hypercholesterolemia, unspecified: Secondary | ICD-10-CM

## 2021-12-02 MED ORDER — DILTIAZEM HCL ER BEADS 240 MG PO CP24: 240.0000 mg | ORAL_CAPSULE | Freq: Every day | ORAL | 2 refills | Status: DC

## 2021-12-02 MED ORDER — AMIODARONE HCL 200 MG PO TABS: 200.0000 mg | ORAL_TABLET | Freq: Two times a day (BID) | ORAL | 1 refills | Status: DC

## 2021-12-02 NOTE — Telephone Encounter (Signed)
Awesome. Glad we were able to get her in today. Thanks Almyra Free!

## 2021-12-02 NOTE — Progress Notes (Signed)
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  Patient also has a history of moderate AI, hypertension, hyperlipidemia, hypothyroidism, and GERD.  Last seen in the office on 06/22/2021.  Past Medical History    Past Medical History:  Diagnosis Date   Allergy    Arthritis    hip OA   Degenerative disc disease    GERD (gastroesophageal reflux disease)    Hyperlipidemia    Hypertension    Hypothyroidism    Migraines    Neuromuscular disorder ([place])    Neuropathy    Nonischemic cardiomyopathy ([place])    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   Past Surgical History:  Procedure Laterality Date   ATRIAL FIBRILLATION ABLATION N/A 12/16/2019   Procedure: ATRIAL FIBRILLATION ABLATION;  Surgeon: Thompson Grayer, MD;  Location: [place] [facility];  Service: Cardiovascular;  Laterality: N/A;   CARDIOVERSION N/A 05/02/2019   Procedure: CARDIOVERSION;  Surgeon: Pixie Casino, MD;  Location: [facility] ENDOSCOPY;  Service: Cardiovascular;  Laterality: N/A;   CARDIOVERSION N/A 05/14/2019   Procedure: CARDIOVERSION;  Surgeon: [name], MD;  Location: [place];  Service: Cardiovascular;  Laterality: N/A;   CATARACT EXTRACTION, BILATERAL     CERVICAL DISCECTOMY  2005   with fusion   CHOLECYSTECTOMY  2006   COLONOSCOPY     LUMBAR  LAMINECTOMY/DECOMPRESSION MICRODISCECTOMY Right 11/13/2014   Procedure: Right Lumbar four-five Microdiskectomy;  Surgeon: Leeroy Cha, MD;  Location: [place] [facility]UR[facility] ORS;  Service: Neurosurgery;  Laterality: Right;  right   POLYPECTOMY     REPLACEMENT TOTAL KNEE Right 05/2012   TEE WITHOUT CARDIOVERSION N/A 05/02/2019   Procedure: TRANSESOPHAGEAL ECHOCARDIOGRAM (TEE);  Surgeon: Pixie Casino, MD;  Location: [facility] ENDOSCOPY;  Service: Cardiovascular;  Laterality: N/A;   TOTAL ABDOMINAL HYSTERECTOMY  1978   TOTAL HIP ARTHROPLASTY Left 04/2011   TRANSTHORACIC ECHOCARDIOGRAM  12/2008   EF=>55%, borderline conc LVH; trace MR; mod TR; trace AV regurg    Allergies  Allergies  Allergen Reactions   Codeine Anaphylaxis   Sulfonamide Derivatives Anaphylaxis   Triptans Other (See Comments)    Stopped blood supply to mesenteric artery per pt   Amlodipine Other (See Comments)    swelling   Chocolate Other (See Comments)    Headache   Onion Other (See Comments)    Headache   Other     MSG- headaches    History of Present Illness    [name] presents today for ongoing assessment and management of atrial fibrillation, status post DCCV and ablation per Dr. Rayann Heman, hypertension, and hyperlipidemia..  The patient called our office due to racing heart rate.  She remains on Eliquis.  The patient comes with her husband and daughter, who reports that she was seen by her neurologist 2 days ago and was found to have an elevated heart rate of 140 bpm when they took her  vital signs.  They have been giving her diltiazem 60 mg every 6 hours around-the-clock to help with her heart rate.  The patient is unaware of the irregular heart rate or rapid heart rate, she just complains of feeling "I am tired", no chest pain, dizziness, near syncope, or shortness of breath.  She does have chronic lower extremity edema, her family states that it is gotten somewhat worse over the last week.  She denies any precipitating  factors to include infection, lack of sleep, bleeding, excessive caffeine.  She has been consistent on Eliquis and has not missed any doses.   Home Medications    Current Outpatient Medications  Medication Sig Dispense Refill   amiodarone (PACERONE) 200 MG tablet Take 1 tablet (200 mg total) by mouth 2 (two) times daily. 180 tablet 1   b complex vitamins tablet Take 1 tablet by mouth daily. With vitamin c 500 mg     baclofen (LIORESAL) 10 MG tablet Take 0.5 tablets by mouth as needed (as needed for migraine pain and neuropathy.).      Cholecalciferol 25 MCG (1000 UT) tablet Take 1,000 Units by mouth daily.      Dextran 70-Hypromellose (ARTIFICIAL TEARS PF OP) Place 1 drop into both eyes daily as needed (For dry eyes).     diltiazem (TIAZAC) 240 MG 24 hr capsule Take 1 capsule (240 mg total) by mouth daily. 30 capsule 2   ELIQUIS 5 MG TABS tablet TAKE 1 TABLET BY MOUTH TWICE A DAY 180 tablet 1   furosemide (LASIX) 20 MG tablet Take 2 tablets (40 mg total) by mouth daily as needed for edema (weight gain and/or swelling). 180 tablet 3   gabapentin (NEURONTIN) 600 MG tablet Take 300 mg by mouth at bedtime.     irbesartan (AVAPRO) 75 MG tablet Take 1 tablet (75 mg total) by mouth daily. 90 tablet 1   levothyroxine (SYNTHROID, LEVOTHROID) 75 MCG tablet Take 75 mcg by mouth daily before breakfast.     potassium chloride SA (KLOR-CON M20) 20 MEQ tablet TAKE 1 TABLET (20 MEQ TOTAL) BY MOUTH AS NEEDED (TAKE ON DAYS LASIX IS TAKEN). 90 tablet 2   promethazine (PHENERGAN) 25 MG tablet Take 25 mg by mouth every 6 (six) hours as needed for nausea.     rosuvastatin (CRESTOR) 20 MG tablet TAKE 1 TABLET BY MOUTH DAILY AT 6 PM. 90 tablet 1   sertraline (ZOLOFT) 25 MG tablet Take 25 mg by mouth daily.     traMADol (ULTRAM) 50 MG tablet Take 50-100 mg by mouth every 4 (four) hours as needed for moderate pain (as needed for migraine pain and neuropathy.).      UBRELVY 100 MG TABS TAKE 100 MG BY MOUTH EVERY 2 (TWO)  HOURS AS NEEDED (MIGRAINE.). MAXIMUM 200MG A DAY. 16 tablet 2   zinc gluconate 50 MG tablet Take 50 mg by mouth daily.     No current facility-administered medications for this visit.     Family History    Family History  Problem Relation Age of Onset   Stroke Mother 70   Coronary artery disease Father 46   Colon polyps Father    Colon cancer Cousin    Esophageal cancer Cousin    Stroke Maternal Grandfather    Stroke Paternal Grandmother    Breast cancer Paternal Grandmother    Suicidality Daughter    Rectal cancer Neg Hx    Stomach cancer Neg Hx    Tremor Neg Hx  Parkinson's disease Neg Hx    Alzheimer's disease Neg Hx    Dementia Neg Hx    She indicated that her mother is deceased. She indicated that her father is deceased. She indicated that her brother is deceased. She indicated that the status of her maternal grandfather is unknown. She indicated that the status of her paternal grandmother is unknown. She indicated that only one of her two daughters is alive. She indicated that the status of her neg hx is unknown.  Social History    Social History   Socioeconomic History   Marital status: Married    Spouse name: Not on file   Number of children: 2   Years of education: 13   Highest education level: Not on file  Occupational History   Occupation: retired    Comment: medical records admin  Tobacco Use   Smoking status: Never   Smokeless tobacco: Never  Vaping Use   Vaping Use: Never used  Substance and Sexual Activity   Alcohol use: No   Drug use: No   Sexual activity: Not on file  Other Topics Concern   Not on file  Social History Narrative   Lives at home with husband   Right handed   Caffeine: maybe 1 cup/day   Social Determinants of Health   Financial Resource Strain: Not on file  Food Insecurity: Not on file  Transportation Needs: Not on file  Physical Activity: Not on file  Stress: Not on file  Social Connections: Not on file  Intimate  Partner Violence: Not on file     Review of Systems    General:  No chills, fever, night sweats or weight changes.  Cardiovascular:  No chest pain, dyspnea on exertion, edema, orthopnea, palpitations, paroxysmal nocturnal dyspnea. Dermatological: No rash, lesions/masses Respiratory: No cough, dyspnea Urologic: No hematuria, dysuria Abdominal:   No nausea, vomiting, diarrhea, bright red blood per rectum, melena, or hematemesis Neurologic:  No visual changes, wkns, changes in mental status. All other systems reviewed and are otherwise negative except as noted above.     Physical Exam    VS:  BP 130/68   Pulse (!) 130   Ht '5\' 7"'  (1.702 m)   Wt 163 lb 9.6 oz (74.2 kg)   LMP  (LMP Unknown)   SpO2 95%   BMI 25.62 kg/m  , BMI Body mass index is 25.62 kg/m.     GEN: Well nourished, well developed, in no acute distress. HEENT: normal. Neck: Supple, no JVD, carotid bruits, or masses\ Cardiac: IRRR, rapid, no murmurs, rubs, or gallops. No clubbing, cyanosis, 1-2+ pretibial edema.  Radials/DP/PT 2+ and equal bilaterally.  Respiratory:  Respirations regular and unlabored, clear to auscultation bilaterally. GI: Soft, nontender, nondistended, BS + x 4. MS: no deformity or atrophy. Skin: warm and dry, no rash. Neuro:  Strength and sensation are intact. Psych: Flat affect.  Accessory Clinical Findings    ECG personally reviewed by me today-atrial fibrillation with RVR, heart rate 130 bpm- No acute changes  Lab Results  Component Value Date   WBC 10.6 06/06/2021   HGB 13.3 06/06/2021   HCT 40.0 06/06/2021   MCV 88 06/06/2021   PLT 166 06/06/2021   Lab Results  Component Value Date   CREATININE 1.46 (H) 06/06/2021   BUN 21 06/06/2021   NA 144 06/06/2021   K 4.8 06/06/2021   CL 107 (H) 06/06/2021   CO2 25 06/06/2021   Lab Results  Component Value Date   ALT  15 02/28/2021   AST 20 02/28/2021   ALKPHOS 74 02/28/2021   BILITOT 0.4 02/28/2021   Lab Results  Component Value  Date   CHOL 221 (H) 05/13/2019   HDL 48 05/13/2019   LDLCALC 148 (H) 05/13/2019   TRIG 124 05/13/2019   CHOLHDL 4.6 05/13/2019    No results found for: "HGBA1C"  Review of Prior Studies:  Echocardiogram: 03/17/2021 1. Left ventricular ejection fraction, by estimation, is 55 to 60%. The  left ventricle has normal function. The left ventricle has no regional  wall motion abnormalities. Left ventricular diastolic parameters are  consistent with Grade II diastolic  dysfunction (pseudonormalization).   2. Right ventricular systolic function is normal. The right ventricular  size is normal. There is normal pulmonary artery systolic pressure. The  estimated right ventricular systolic pressure is 44.3 mmHg.   3. Left atrial size was mildly dilated.   4. The mitral valve is grossly normal. Trivial mitral valve  regurgitation. No evidence of mitral stenosis.   5. The aortic valve is tricuspid. There is mild calcification of the  aortic valve. Aortic valve regurgitation is moderate. No aortic stenosis  is present.   6. There is mild dilatation of the ascending aorta, measuring 41 mm.   7. The inferior vena cava is normal in size with greater than 50%  respiratory variability, suggesting right atrial pressure of 3 mmHg.   Cardiac CTA: 12/10/2019 . There is normal pulmonary vein drainage into the left atrium. No pulmonary vein stenosis.   2. Large left atrial appendage, no left atrial appendage thrombus. No intracardiac mass or thrombus.   3. The esophagus runs adjacent to left pulmonary veins.   4. Borderline dilation of ascending aorta, 39 mm at mid ascending aorta. Assessment & Plan   1.  Paroxysmal atrial fibrillation: Patient has a history of atrial fibrillation with ablation in September 2021 by Dr. Rayann Heman.  She is unaware of her heart being irregular or rapid.  Her only complaint is that of fatigue.  I will start her on amiodarone 200 mg p.o. twice daily, change her every 6  hours 60 mg diltiazem to long-acting 240 mg diltiazem daily.  She will continue her Eliquis as directed.  She is stable currently, but I did speak with Dr. Debara Pickett, her primary cardiologist and DOD at [facility] today, concerning whether or not she needs to be hospitalized as this is Friday.  He felt that since she was stable that we can go ahead and start treatment medically as an outpatient and have a cardioversion done sooner than later.  She will take Lasix 20 milligrams daily as directed.  I spoke with the family and they are willing to proceed with cardioversion.  The soonest cardioversion date is December 15, 2021 with Dr. Harrell Gave.  This has been ordered and will be planned for 10:30 AM that day.  Cardioversion instructions have been provided, no medications will need to be held concerning SGLT receptor agonist, as she is not taking them.  I have advised that if she does become symptomatic, including chest pain, shortness of breath, or worsening edema, she will need to be taken to the hospital where she can be cardioverted sooner and loaded with IV amiodarone.  In the interim I will check a UA (she has frequent UTIs), CBC for anemia, and be met to evaluate for electrolyte abnormalities to evaluate for etiology of return to A-fib RVR.  2.  Hypertension: Despite elevated heart rate the patient's blood pressure is  well controlled.  She will continue on diltiazem and irbesartan, as directed for heart rate control and blood pressure management.  3.  Hyperlipidemia: She will continue on rosuvastatin 20 mg daily.  Goal of LDL less than 100.  Follow-up labs can be completed on next visit if not completed by PCP.  4.  Hypothyroidism: Followed by PCP.  Family states she has had recent labs which have been within normal limits.  Current medicines are reviewed at length with the patient today.  I have spent 45 min's  dedicated to the care of this patient on the date of this encounter to include pre-visit  review of records, assessment, management and diagnostic testing,with shared decision making.  Signed, Phill Myron. West Pugh, ANP, [place]   12/02/2021 4:48 PM      Office [phone] Fax [phone]  Notice: This dictation was prepared with Dragon dictation along with smaller phrase technology. Any transcriptional errors that result from this process are unintentional and may not be corrected upon review.

## 2021-12-02 NOTE — Telephone Encounter (Signed)
If her heart rate is not improving on Cardizem and we cannot get her into clinic today, I think she should go to the ED (I just worry about her not tolerating a heart rate in the 140s all weekend). If her heart rate is improving and she is still asymptomatic, she needs an appointment with someone early next week.   Thank you!

## 2021-12-02 NOTE — Telephone Encounter (Signed)
Called patient daughter back. She states they did try the Cardizem during the night and the HR got to around 120 or a bit lower. I did give recommendations from [place], [place]- there was an appointment for today with Jory Sims, NP at 3:35 PM which we scheduled. However, I did give precautions to seek emergency care if HR continued to stay elevated or increase further. Daughter did verbalize understanding. Thankful for call back.

## 2021-12-02 NOTE — Patient Instructions (Signed)
Medication Instructions:  Stop Diltiazem 30 mg. Start Amiodarone 200 mg ( Take 1 Tablet Twice Daily). Start Diltiazem 240 mg ( Take 1 Capsule Daily). *If you need a refill on your cardiac medications before your next appointment, please call your pharmacy*   Lab Work: CBC, CMET, TSH, Urinalysis Today If you have labs (blood work) drawn today and your tests are completely normal, you will receive your results only by: [place] (if you have MyChart) OR A paper copy in the mail If you have any lab test that is abnormal or we need to change your treatment, we will call you to review the results.   Testing/Procedures: Dear Hillard Danker You are scheduled for a Cardioversion. Cardioversion on December 15, 2021 with Dr. Shawna Orleans Chrisptopher.  Please arrive at the [facility] (Main Entrance A) at [facility]: [address] [place], [place] [address] at 9:30 am. (1 hour prior to procedure unless lab work is needed; if lab work is needed arrive 1.5 hours ahead)  DIET: Nothing to eat or drink after midnight except a sip of water with medications (see medication instructions below)  FYI: For your safety, and to allow [place] to monitor your vital signs accurately during the surgery/procedure we request that   if you have artificial nails, gel coating, SNS etc. Please have those removed prior to your surgery/procedure. Not having the nail coverings /polish removed may result in cancellation or delay of your surgery/procedure.   Medication Instructions: Hold ; None needed  Continue your anticoagulant: Eliquis You will need to continue your anticoagulant after your procedure until you  are told by your  Provider that it is safe to stop   Labs: If patient is on Coumadin, patient needs pt/INR, CBC, BMET within 3 days (No pt/INR needed for patients taking Xarelto, Eliquis, Pradaxa) For patients receiving anesthesia for TEE and all Cardioversion patients: BMET, CBC within 1 week  Come  to: [place], [address].  Bring your insurance cards.  *Special Note: Every effort is made to have your procedure done on time. Occasionally there are emergencies that occur at the hospital that may cause delays. Please be patient if a delay does occur.     Follow-Up: At [facility], you and your health needs are our priority.  As part of our continuing mission to provide you with exceptional heart care, we have created designated Provider Care Teams.  These Care Teams include your primary Cardiologist (physician) and Advanced Practice Providers (APPs -  Physician Assistants and Nurse Practitioners) who all work together to provide you with the care you need, when you need it.  We recommend signing up for the patient portal called "MyChart".  Sign up information is provided on this After Visit Summary.  MyChart is used to connect with patients for Virtual Visits (Telemedicine).  Patients are able to view lab/test results, encounter notes, upcoming appointments, etc.  Non-urgent messages can be sent to your provider as well.   To learn more about what you can do with MyChart, go to [email or web address].    Your next appointment:   Post Cardioversion  The format for your next appointment:   In Person  Provider:   Jory Sims, DNP, ANP    or, Pixie Casino, MD   Other Instructions Heart Rate continues to be Elevated Go to nearest Emergency Room.  I

## 2021-12-03 ENCOUNTER — Telehealth: Payer: Self-pay | Admitting: Physician Assistant

## 2021-12-03 ENCOUNTER — Telehealth: Payer: Self-pay | Admitting: Adult Health

## 2021-12-03 ENCOUNTER — Other Ambulatory Visit (INDEPENDENT_AMBULATORY_CARE_PROVIDER_SITE_OTHER): Payer: Medicare HMO | Admitting: Adult Health

## 2021-12-03 DIAGNOSIS — I480 Paroxysmal atrial fibrillation: Secondary | ICD-10-CM

## 2021-12-03 LAB — URINALYSIS
Bilirubin, UA: NEGATIVE
Glucose, UA: NEGATIVE
Leukocytes,UA: NEGATIVE
Nitrite, UA: NEGATIVE
Specific Gravity, UA: 1.029 (ref 1.005–1.030)
Urobilinogen, Ur: 1 mg/dL (ref 0.2–1.0)
pH, UA: 5.5 (ref 5.0–7.5)

## 2021-12-03 LAB — COMPREHENSIVE METABOLIC PANEL
ALT: 9 IU/L (ref 0–32)
AST: 16 IU/L (ref 0–40)
Albumin/Globulin Ratio: 1.7 (ref 1.2–2.2)
Albumin: 4.1 g/dL (ref 3.8–4.8)
Alkaline Phosphatase: 77 IU/L (ref 44–121)
BUN/Creatinine Ratio: 11 — ABNORMAL LOW (ref 12–28)
BUN: 12 mg/dL (ref 8–27)
Bilirubin Total: 0.4 mg/dL (ref 0.0–1.2)
CO2: 24 mmol/L (ref 20–29)
Calcium: 9.8 mg/dL (ref 8.7–10.3)
Chloride: 107 mmol/L — ABNORMAL HIGH (ref 96–106)
Creatinine, Ser: 1.11 mg/dL — ABNORMAL HIGH (ref 0.57–1.00)
Globulin, Total: 2.4 g/dL (ref 1.5–4.5)
Glucose: 100 mg/dL — ABNORMAL HIGH (ref 70–99)
Potassium: 4.2 mmol/L (ref 3.5–5.2)
Sodium: 147 mmol/L — ABNORMAL HIGH (ref 134–144)
Total Protein: 6.5 g/dL (ref 6.0–8.5)
eGFR: 50 mL/min/{1.73_m2} — ABNORMAL LOW (ref >59)

## 2021-12-03 LAB — CBC
Hematocrit: 40.4 % (ref 34.0–46.6)
Hemoglobin: 13.1 g/dL (ref 11.1–15.9)
MCH: 28.5 pg (ref 26.6–33.0)
MCHC: 32.4 g/dL (ref 31.5–35.7)
MCV: 88 fL (ref 79–97)
Platelets: 168 10*3/uL (ref 150–450)
RBC: 4.59 x10E6/uL (ref 3.77–5.28)
RDW: 13.3 % (ref 11.7–15.4)
WBC: 6.2 10*3/uL (ref 3.4–10.8)

## 2021-12-03 LAB — TSH: TSH: 2.86 u[IU]/mL (ref 0.450–4.500)

## 2021-12-03 MED ORDER — METOPROLOL TARTRATE 25 MG PO TABS: 25.0000 mg | ORAL_TABLET | Freq: Two times a day (BID) | ORAL | 11 refills | Status: DC

## 2021-12-03 NOTE — Telephone Encounter (Signed)
Called Mr. Aitken, her husband, to let them know labs results. No evidence of UTI or anemia.  I added metoprolol tartrate 25 mg BID to help slow down her heart rate while she waits for DCCV on 12/16/2022 with Dr. Harrell Gave. She will come in next Friday, 12/10/2022 for an EKG. Mr. zetta stoneman understanding.

## 2021-12-03 NOTE — Telephone Encounter (Addendum)
81 year old female seen in the office yesterday with atrial fibrillation with rapid ventricular rate.  She was placed on amiodarone.  Her diltiazem 60 mg every 6 hours was adjusted to diltiazem 240 mg daily.  She was already on Eliquis.  She has been set up for cardioversion in a couple of weeks.  The patient's daughter(Bridget [name] on file) called the answering service to confirm if the patient was to stop any medication she is currently taking.  Bridget Sims, NP followed up with the patient this morning by phone and added metoprolol tartrate 25 mg twice daily to her medical regimen to keep her heart rate under better control.  When I called the patient's daughter, she noted that her heart rate is now in the 52s.  Her blood pressure is 120/80.  She is feeling much better. She did not get the Diltiazem 240 mg yesterday.  I contacted the pharmacy.  They had to order the diltiazem 240 mg.  That should be in on Monday 9/18.  PLAN: It sounds as though the patient has converted back to sinus rhythm.  I have advised her daughter to hold off on starting the metoprolol for now. If she develops recurrent symptoms and records a pulse over 100, she can give a dose of metoprolol and call our office. She should remain on diltiazem 60 mg every 6 hours until she can get the new prescription for diltiazem 240 mg. Continue with Eliquis and proceed with follow-up next week as planned. Richardson Dopp, PA-C 12/03/2021 2:36 PM

## 2021-12-05 ENCOUNTER — Telehealth: Payer: Self-pay

## 2021-12-05 NOTE — Telephone Encounter (Addendum)
Called patient spoke with spouse to advise per K.Lawrence patient to come in to check heart Rate prior to procedure.----- Message from Lendon Colonel, NP sent at 12/03/2021  9:14 AM EDT ----- Called her husband with normal lab results. I sent a Rx for metoprolol tartrate 25 mg BID to help slow the HR until DCCV. Have her come in Friday while I am there to check on her rate.   Thanks! KL

## 2021-12-09 ENCOUNTER — Ambulatory Visit: Payer: Medicare HMO | Attending: Adult Health | Admitting: Adult Health

## 2021-12-09 ENCOUNTER — Encounter: Payer: Self-pay | Admitting: Adult Health

## 2021-12-09 VITALS — BP 116/68 | HR 63 | Ht 67.0 in | Wt 167.9 lb

## 2021-12-09 DIAGNOSIS — I480 Paroxysmal atrial fibrillation: Secondary | ICD-10-CM | POA: Diagnosis not present

## 2021-12-09 NOTE — Progress Notes (Signed)
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  She was seen in the office on 12/02/2021 and was found to be in afib RVR with rate of 130 bpm. She was placed on amiodarone 200 mg BID, planned for DCCV on 12/15/2021. Was also ordered metoprolol 25 mg BID to aid in HR reduction until amiodarone level increased to hold her in NSR.   The following day, 12/03/2021 family called and stated that she her HR was 70 bpm and regular. They were asked not to give her the metoprolol but to continue with amiodarone dosing.   Past Medical History    Past Medical History:  Diagnosis Date   Allergy    Arthritis    hip OA   Degenerative disc disease    GERD (gastroesophageal reflux disease)    Hyperlipidemia    Hypertension    Hypothyroidism    Migraines    Neuromuscular disorder ([place])    Neuropathy    Nonischemic cardiomyopathy ([place])    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   Past Surgical History:  Procedure Laterality Date   ATRIAL FIBRILLATION ABLATION N/A 12/16/2019   Procedure: ATRIAL FIBRILLATION ABLATION;  Surgeon: Thompson Grayer, MD;  Location: [place];  Service: Cardiovascular;  Laterality: N/A;   CARDIOVERSION N/A 05/02/2019   Procedure: CARDIOVERSION;  Surgeon: Pixie Casino, MD;  Location: [facility] ENDOSCOPY;  Service: Cardiovascular;   Laterality: N/A;   CARDIOVERSION N/A 05/14/2019   Procedure: CARDIOVERSION;  Surgeon: [name], MD;  Location: [facility];  Service: Cardiovascular;  Laterality: N/A;   CATARACT EXTRACTION, BILATERAL     CERVICAL DISCECTOMY  2005   with fusion   CHOLECYSTECTOMY  2006   COLONOSCOPY     LUMBAR LAMINECTOMY/DECOMPRESSION MICRODISCECTOMY Right 11/13/2014   Procedure: Right Lumbar four-five Microdiskectomy;  Surgeon: Leeroy Cha, MD;  Location: [facility]URO ORS;  Service: Neurosurgery;  Laterality: Right;  right   POLYPECTOMY     REPLACEMENT TOTAL KNEE Right 05/2012   TEE WITHOUT CARDIOVERSION N/A 05/02/2019   Procedure: TRANSESOPHAGEAL ECHOCARDIOGRAM (TEE);  Surgeon: Pixie Casino, MD;  Location: [facility] ENDOSCOPY;  Service: Cardiovascular;  Laterality: N/A;   TOTAL ABDOMINAL HYSTERECTOMY  1978   TOTAL HIP ARTHROPLASTY Left 04/2011   TRANSTHORACIC ECHOCARDIOGRAM  12/2008   EF=>55%, borderline conc LVH; trace MR; mod TR; trace AV regurg    Allergies  Allergies  Allergen Reactions   Codeine Anaphylaxis   Sulfonamide Derivatives Anaphylaxis   Triptans Other (See Comments)    Stopped blood supply to mesenteric artery per pt   Amlodipine Other (See Comments)    swelling   Chocolate Other (See Comments)    Headache   Onion Other (See Comments)    Headache   Other     MSG- headaches    History of Present Illness    Mrs. Deady returns today for EKG to assess  HR response to addition of amiodarone 200 mg BID and to plan for DCCV if this is still necessary. Her family called the day following last appointment on 12/02/2021 to report her HR was 70 bpm. She was to continue amiodarone loading for PAF.   She comes today not feeling much different than 1 week ago despite being in normal sinus rhythm.  She is cardiac unaware.  She is tolerating amiodarone dosing.  Home Medications    Current Outpatient Medications  Medication Sig Dispense Refill   amiodarone (PACERONE) 200 MG tablet  Take 1 tablet (200 mg total) by mouth 2 (two) times daily. 180 tablet 1   b complex vitamins tablet Take 1 tablet by mouth daily. With vitamin c 500 mg     baclofen (LIORESAL) 10 MG tablet Take 0.5 tablets by mouth as needed (as needed for migraine pain and neuropathy.).      Cholecalciferol 25 MCG (1000 UT) tablet Take 1,000 Units by mouth daily.      Dextran 70-Hypromellose (ARTIFICIAL TEARS PF OP) Place 1 drop into both eyes daily as needed (For dry eyes).     diltiazem (TIAZAC) 240 MG 24 hr capsule Take 1 capsule (240 mg total) by mouth daily. 30 capsule 2   ELIQUIS 5 MG TABS tablet TAKE 1 TABLET BY MOUTH TWICE A DAY 180 tablet 1   furosemide (LASIX) 20 MG tablet Take 2 tablets (40 mg total) by mouth daily as needed for edema (weight gain and/or swelling). 180 tablet 3   gabapentin (NEURONTIN) 600 MG tablet Take 300 mg by mouth at bedtime.     irbesartan (AVAPRO) 75 MG tablet Take 1 tablet (75 mg total) by mouth daily. 90 tablet 1   levothyroxine (SYNTHROID, LEVOTHROID) 75 MCG tablet Take 75 mcg by mouth daily before breakfast.     metoprolol tartrate (LOPRESSOR) 25 MG tablet Take 1 tablet (25 mg total) by mouth 2 (two) times daily. 60 tablet 11   potassium chloride SA (KLOR-CON M20) 20 MEQ tablet TAKE 1 TABLET (20 MEQ TOTAL) BY MOUTH AS NEEDED (TAKE ON DAYS LASIX IS TAKEN). 90 tablet 2   promethazine (PHENERGAN) 25 MG tablet Take 25 mg by mouth every 6 (six) hours as needed for nausea.     rosuvastatin (CRESTOR) 20 MG tablet TAKE 1 TABLET BY MOUTH DAILY AT 6 PM. 90 tablet 1   sertraline (ZOLOFT) 25 MG tablet Take 25 mg by mouth daily.     traMADol (ULTRAM) 50 MG tablet Take 50-100 mg by mouth every 4 (four) hours as needed for moderate pain (as needed for migraine pain and neuropathy.).      UBRELVY 100 MG TABS TAKE 100 MG BY MOUTH EVERY 2 (TWO) HOURS AS NEEDED (MIGRAINE.). MAXIMUM '200MG'$  A DAY. 16 tablet 2   zinc gluconate 50 MG tablet Take 50 mg by mouth daily.     No current  facility-administered medications for this visit.     Family History    Family History  Problem Relation Age of Onset   Stroke Mother 37   Coronary artery disease Father 15   Colon polyps Father    Colon cancer Cousin    Esophageal cancer Cousin    Stroke Maternal Grandfather    Stroke Paternal Grandmother    Breast cancer Paternal Grandmother    Suicidality Daughter    Rectal cancer Neg Hx    Stomach cancer Neg Hx    Tremor Neg Hx    Parkinson's disease Neg Hx  Alzheimer's disease Neg Hx    Dementia Neg Hx    She indicated that her mother is deceased. She indicated that her father is deceased. She indicated that her brother is deceased. She indicated that the status of her maternal grandfather is unknown. She indicated that the status of her paternal grandmother is unknown. She indicated that only one of her two daughters is alive. She indicated that the status of her neg hx is unknown.  Social History    Social History   Socioeconomic History   Marital status: Married    Spouse name: Not on file   Number of children: 2   Years of education: 13   Highest education level: Not on file  Occupational History   Occupation: retired    Comment: medical records admin  Tobacco Use   Smoking status: Never   Smokeless tobacco: Never  Vaping Use   Vaping Use: Never used  Substance and Sexual Activity   Alcohol use: No   Drug use: No   Sexual activity: Not on file  Other Topics Concern   Not on file  Social History Narrative   Lives at home with husband   Right handed   Caffeine: maybe 1 cup/day   Social Determinants of Health   Financial Resource Strain: Not on file  Food Insecurity: Not on file  Transportation Needs: Not on file  Physical Activity: Not on file  Stress: Not on file  Social Connections: Not on file  Intimate Partner Violence: Not on file     Review of Systems    General:  No chills, fever, night sweats or weight changes.  Cardiovascular:  No  chest pain, dyspnea on exertion, edema, orthopnea, palpitations, paroxysmal nocturnal dyspnea. Dermatological: No rash, lesions/masses Respiratory: No cough, dyspnea Urologic: No hematuria, dysuria Abdominal:   No nausea, vomiting, diarrhea, bright red blood per rectum, melena, or hematemesis Neurologic:  No visual changes, wkns, changes in mental status. All other systems reviewed and are otherwise negative except as noted above.     Physical Exam    VS:  BP 116/68 (BP Location: Left Arm, Patient Position: Sitting, Cuff Size: Normal)   Pulse 63   Ht '5\' 7"'$  (1.702 m)   Wt 167 lb 14.4 oz (76.2 kg)   LMP  (LMP Unknown)   SpO2 98%   BMI 26.30 kg/m  , BMI Body mass index is 26.3 kg/m.     GEN: Well nourished, well developed, in no acute distress. HEENT: normal. Neck: Supple, no JVD, carotid bruits, or masses. Cardiac: RRR, soft systolic murmurs, rubs, or gallops. No clubbing, cyanosis, edema.  Radials/DP/PT 2+ and equal bilaterally.  Respiratory:  Respirations regular and unlabored, clear to auscultation bilaterally. GI: Soft, nontender, nondistended, BS + x 4. MS: no deformity or atrophy. Skin: warm and dry, no rash. Neuro:  Strength and sensation are intact. Psych: Normal affect.  Accessory Clinical Findings    ECG personally reviewed by me today-normal sinus rhythm, heart rate of 63 bpm, anterior T wave flattening noted-converted from A-fib RVR on 12/02/2021.  Lab Results  Component Value Date   WBC 6.2 12/02/2021   HGB 13.1 12/02/2021   HCT 40.4 12/02/2021   MCV 88 12/02/2021   PLT 168 12/02/2021   Lab Results  Component Value Date   CREATININE 1.11 (H) 12/02/2021   BUN 12 12/02/2021   NA 147 (H) 12/02/2021   K 4.2 12/02/2021   CL 107 (H) 12/02/2021   CO2 24 12/02/2021   Lab  Results  Component Value Date   ALT 9 12/02/2021   AST 16 12/02/2021   ALKPHOS 77 12/02/2021   BILITOT 0.4 12/02/2021   Lab Results  Component Value Date   CHOL 221 (H) 05/13/2019    HDL 48 05/13/2019   LDLCALC 148 (H) 05/13/2019   TRIG 124 05/13/2019   CHOLHDL 4.6 05/13/2019    No results found for: "HGBA1C"  Review of Prior Studies: Echocardiogram 03/17/2021 1. Left ventricular ejection fraction, by estimation, is 55 to 60%. The  left ventricle has normal function. The left ventricle has no regional  wall motion abnormalities. Left ventricular diastolic parameters are  consistent with Grade II diastolic  dysfunction (pseudonormalization).   2. Right ventricular systolic function is normal. The right ventricular  size is normal. There is normal pulmonary artery systolic pressure. The  estimated right ventricular systolic pressure is 68.3 mmHg.   3. Left atrial size was mildly dilated.   4. The mitral valve is grossly normal. Trivial mitral valve  regurgitation. No evidence of mitral stenosis.   5. The aortic valve is tricuspid. There is mild calcification of the  aortic valve. Aortic valve regurgitation is moderate. No aortic stenosis  is present.   6. There is mild dilatation of the ascending aorta, measuring 41 mm.   7. The inferior vena cava is normal in size with greater than 50%  respiratory variability, suggesting right atrial pressure of 3 mmHg.   Atrial fib ablation 12/16/2019 SURGEON:  Thompson Grayer, MD   PREPROCEDURE DIAGNOSES: 1. Persistent atrial fibrillation. 2. Atrial flutter   POSTPROCEDURE DIAGNOSES: 1. Persistent atrial fibrillation. 2. Isthmus dependant right atrial flutter 3. Ectopic atrial tachycardia (terminated during 3D mapping and was not reinducible).    Assessment & Plan   1.  Paroxysmal atrial fibrillation: On last office visit 1 week ago she was found to be in A-fib RVR heart rate of 133 bpm.  She was loaded with amiodarone 200 mg twice daily, and also prescription for metoprolol 25 mg twice daily was provided to get heart rate down while amiodarone was getting into her system.  She converted to normal sinus rhythm within 24  hours.  She will not take metoprolol.  She will continue amiodarone 200 mg twice daily for 1 more week and then decrease to 200 mg daily.  These instructions were provided to her and her family who verbalized understanding.  She will continue Pacerone 5 mg twice daily, and diltiazem 240 mg daily.  Blood pressure is well controlled.  DCCV has been canceled.    Current medicines are reviewed at length with the patient today.  I have spent 15 min's  dedicated to the care of this patient on the date of this encounter to include pre-visit review of records, assessment, management and diagnostic testing,with shared decision making. Signed, Phill Myron. West Pugh, ANP, [place]   12/09/2021 11:47 AM      Office [phone] Fax [phone]  Notice: This dictation was prepared with Dragon dictation along with smaller phrase technology. Any transcriptional errors that result from this process are unintentional and may not be corrected upon review.

## 2021-12-10 ENCOUNTER — Other Ambulatory Visit: Payer: Self-pay | Admitting: Internal Medicine

## 2021-12-13 ENCOUNTER — Telehealth: Payer: Self-pay | Admitting: Internal Medicine

## 2021-12-13 NOTE — Telephone Encounter (Signed)
Daughter stated that years ago when patient was on amiodarone she became disoriented and very sleepy. This is happening again while on amiodarone. Patient is oriented to person and place, but not time (day of week, time of day), and is very drowsy. "Not herself, can't find her words, not normal". No symptoms of stroke. "This happened last time she was on amiodarone."T he patient did not take amiodarone last night or this morning. Please advise on medication.

## 2021-12-13 NOTE — Telephone Encounter (Signed)
Pt c/o medication issue:  1. Name of Medication:  amiodarone (PACERONE) 200 MG tablet  2. How are you currently taking this medication (dosage and times per day)?  1 tablet (200 mg total) by mouth 2 (two) times daily, as prescribed  3. Are you having a reaction (difficulty breathing--STAT)?   4. What is your medication issue?   Patient's daughter states this medication has caused a state of delirium--patient sleeps all day, doesn't know if it is day or night, and is overall doing things that aren't normal to her. She slept all day Sunday, 9/24. Yesterday, 9/25, she was a little better, but she is still not normal, per daughter. Please advise.

## 2021-12-13 NOTE — Telephone Encounter (Signed)
Daughter advised of recommendation from Dr. Debara Pickett: Would advise decreasing the amiodarone dose to 200 mg daily in the morning starting today. Daughter verbalized understanding.

## 2021-12-15 ENCOUNTER — Ambulatory Visit (HOSPITAL_COMMUNITY): Admit: 2021-12-15 | Payer: Medicare HMO | Admitting: Cardiology

## 2021-12-15 ENCOUNTER — Encounter (HOSPITAL_COMMUNITY): Payer: Self-pay

## 2021-12-15 ENCOUNTER — Other Ambulatory Visit: Payer: Self-pay | Admitting: Internal Medicine

## 2021-12-15 SURGERY — CARDIOVERSION
Anesthesia: General

## 2021-12-15 NOTE — Telephone Encounter (Signed)
Prescription refill request for Eliquis received. Indication:Afib Last office visit:9/23 Scr:1.1 Age: 81 Weight:76.2 kg  Prescription refilled

## 2021-12-20 NOTE — Telephone Encounter (Signed)
Spoke to patient's daughter she stated she wanted to ask Dr.Hilty if mother can stop taking Amiodarone.Stated she decreased to 200 mg daily 1 week ago.She continues to be disoriented, but alittle better.She is concerned she sleeps too much and she is dizzy.I will send message to Dr.Hilty for advice.

## 2021-12-20 NOTE — Telephone Encounter (Signed)
Ok .. She can stop amiodarone - will need follow-up regarding afib to discuss options and see if she is improving. This could be with Jory Sims who originally put her on amiodarone or me.  Thanks.  Dr Lemmie Evens

## 2021-12-20 NOTE — Telephone Encounter (Signed)
Daughter called to get an alternative to amiodarone (PACERONE) 200 MG tablet.  Daughter stated the patient has been very sleepy and when awake is very dizzy on this medication.

## 2021-12-21 NOTE — Telephone Encounter (Signed)
Spoke to patient's daughter Bridget [name] Dr.Hilty advised ok to stop taking Amiodarone.Keep appointment with Jory Sims DNP 10/20 at 3:35 pm to discuss options.

## 2022-01-05 NOTE — Progress Notes (Deleted)
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  She was seen in the office on 12/02/2021 and was found to be in afib RVR with rate of 130 bpm. She was placed on amiodarone 200 mg BID, planned for DCCV on 12/15/2021. Was also ordered metoprolol 25 mg BID to aid in HR reduction until amiodarone level increased to hold her in NSR.   She has other history of moderate AI, hypertension, HL,hypothyroidism, and GERD.  Past Medical History    Past Medical History:  Diagnosis Date   Allergy    Arthritis    hip OA   Degenerative disc disease    GERD (gastroesophageal reflux disease)    Hyperlipidemia    Hypertension    Hypothyroidism    Migraines    Neuromuscular disorder ([place])    Neuropathy    Nonischemic cardiomyopathy ([place])    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   Past Surgical History:  Procedure Laterality Date   ATRIAL FIBRILLATION ABLATION N/A 12/16/2019   Procedure: ATRIAL FIBRILLATION ABLATION;  Surgeon: Thompson Grayer, MD;  Location: [facility];  Service: Cardiovascular;  Laterality: N/A;   CARDIOVERSION N/A 05/02/2019   Procedure: CARDIOVERSION;  Surgeon: Pixie Casino, MD;  Location: [facility] ENDOS[facility];  Service: Cardiovascular;  Laterality: N/A;   CARDIOVERSION N/A 05/14/2019   Procedure: CARDIOVERSION;  Surgeon: [name], MD;  Location: [facility];  Service: Cardiovascular;  Laterality: N/A;   CATARACT EXTRACTION, BILATERAL     CERVICAL DISCECTOMY  2005   with fusion   CHOLECYSTECTOMY  2006   COLONOSCOPY     LUMBAR LAMINECTOMY/DECOMPRESSION MICRODISCECTOMY Right 11/13/2014   Procedure: Right Lumbar four-five Microdiskectomy;  Surgeon: Leeroy Cha, MD;  Location: [place] NEURO ORS;  Service: Neurosurgery;  Laterality: Right;  right   POLYPECTOMY     REPLACEMENT TOTAL KNEE Right 05/2012   TEE WITHOUT CARDIOVERSION N/A 05/02/2019   Procedure: TRANSESOPHAGEAL ECHOCARDIOGRAM (TEE);  Surgeon: Pixie Casino, MD;  Location: [facility] ENDOSCOPY;  Service: Cardiovascular;  Laterality: N/A;   [place] Left 04/2011   TRANSTHORACIC ECHOCARDIOGRAM  12/2008   EF=>55%, borderline conc LVH; trace MR; mod TR; trace AV regurg    Allergies  Allergies  Allergen Reactions   Codeine Anaphylaxis   Sulfonamide Derivatives Anaphylaxis   Triptans Other (See Comments)    Stopped blood supply to mesenteric artery per pt   Amlodipine Other (See Comments)    swelling   Chocolate Other (See Comments)    Headache   Onion Other (See Comments)    Headache   Other     MSG- headaches    History of Present Illness    [name] returns for ongoing assessment and management of PAF, recently placed on amiodarone, as she had responded to this in the past. She was scheduled for DCCV but converted  on amiodarone 200 mg BID. She became hypotensive after continuing this dose for one additional week and therefore amiodarone was decreased to 100 mg daily. We received a call from her daughter who reported that she was becoming disoriented and sleeping a lot even with decreased dose of amiodarone. Therefore amiodarone was discontinued.   Home Medications    Current Outpatient Medications  Medication Sig Dispense Refill   b complex vitamins tablet Take 1 tablet by mouth daily. With  vitamin c 500 mg     baclofen (LIORESAL) 10 MG tablet Take 0.5 tablets by mouth as needed (as needed for migraine pain and neuropathy.).      Cholecalciferol 25 MCG (1000 UT) tablet Take 1,000 Units by mouth daily.      Dextran 70-Hypromellose (ARTIFICIAL TEARS PF OP) Place 1 drop into both eyes daily as needed (For dry eyes).     diltiazem (TIAZAC) 240 MG 24 hr capsule Take 1 capsule (240 mg total) by mouth daily. 30 capsule 2   ELIQUIS 5 MG TABS tablet TAKE 1 TABLET BY MOUTH TWICE A DAY 180 tablet 1   furosemide (LASIX) 20 MG tablet Take 2 tablets (40 mg total) by mouth daily as needed for edema (weight gain and/or swelling). 180 tablet 3   gabapentin (NEURONTIN) 600 MG tablet Take 300 mg by mouth at bedtime.     irbesartan (AVAPRO) 75 MG tablet Take 1 tablet (75 mg total) by mouth daily. 90 tablet 1   levothyroxine (SYNTHROID, LEVOTHROID) 75 MCG tablet Take 75 mcg by mouth daily before breakfast.     metoprolol tartrate (LOPRESSOR) 25 MG tablet Take 1 tablet (25 mg total) by mouth 2 (two) times daily. 60 tablet 11   potassium chloride SA (KLOR-CON M20) 20 MEQ tablet TAKE 1 TABLET (20 MEQ TOTAL) BY MOUTH AS NEEDED (TAKE ON DAYS LASIX IS TAKEN). 90 tablet 2   promethazine (PHENERGAN) 25 MG tablet Take 25 mg by mouth every 6 (six) hours as needed for nausea.     rosuvastatin (CRESTOR) 20 MG tablet TAKE 1 TABLET BY MOUTH DAILY AT 6 PM. 90 tablet 1   sertraline (ZOLOFT) 25 MG tablet Take 25 mg by mouth daily.     traMADol (ULTRAM) 50 MG tablet Take 50-100 mg by mouth every 4 (four) hours as needed for moderate pain (as needed for migraine pain and neuropathy.).      UBRELVY 100 MG TABS TAKE 100 MG BY MOUTH EVERY 2 (TWO) HOURS AS NEEDED (MIGRAINE.). MAXIMUM '200MG'$  A DAY. 16 tablet 2   zinc gluconate 50 MG tablet Take 50 mg by mouth daily.     No current facility-administered medications for this visit.     Family History    Family History  Problem Relation Age of Onset   Stroke Mother 60    Coronary artery disease Father 46   Colon polyps Father    Colon cancer Cousin    Esophageal cancer Cousin    Stroke Maternal Grandfather    Stroke Paternal Grandmother    Breast cancer Paternal Grandmother    Suicidality Daughter    Rectal cancer Neg Hx    Stomach cancer Neg Hx    Tremor Neg Hx    Parkinson's disease Neg Hx    Alzheimer's disease Neg Hx    Dementia Neg Hx    She indicated that her mother is deceased. She indicated that her father is deceased. She indicated that her brother is deceased. She indicated that the status of her  maternal grandfather is unknown. She indicated that the status of her paternal grandmother is unknown. She indicated that only one of her two daughters is alive. She indicated that the status of her neg hx is unknown.  Social History    Social History   Socioeconomic History   Marital status: Married    Spouse name: Not on file   Number of children: 2   Years of education: 13   Highest education level: Not on file  Occupational History   Occupation: retired    Comment: medical records admin  Tobacco Use   Smoking status: Never   Smokeless tobacco: Never  Vaping Use   Vaping Use: Never used  Substance and Sexual Activity   Alcohol use: No   Drug use: No   Sexual activity: Not on file  Other Topics Concern   Not on file  Social History Narrative   Lives at home with husband   Right handed   Caffeine: maybe 1 cup/day   Social Determinants of Health   Financial Resource Strain: Not on file  Food Insecurity: Not on file  Transportation Needs: Not on file  Physical Activity: Not on file  Stress: Not on file  Social Connections: Not on file  Intimate Partner Violence: Not on file     Review of Systems    General:  No chills, fever, night sweats or weight changes.  Cardiovascular:  No chest pain, dyspnea on exertion, edema, orthopnea, palpitations, paroxysmal nocturnal dyspnea. Dermatological: No rash,  lesions/masses Respiratory: No cough, dyspnea Urologic: No hematuria, dysuria Abdominal:   No nausea, vomiting, diarrhea, bright red blood per rectum, melena, or hematemesis Neurologic:  No visual changes, wkns, changes in mental status. All other systems reviewed and are otherwise negative except as noted above.     Physical Exam    VS:  LMP  (LMP Unknown)  , BMI There is no height or weight on file to calculate BMI.     GEN: Well nourished, well developed, in no acute distress. HEENT: normal. Neck: Supple, no JVD, carotid bruits, or masses. Cardiac: RRR, no murmurs, rubs, or gallops. No clubbing, cyanosis, edema.  Radials/DP/PT 2+ and equal bilaterally.  Respiratory:  Respirations regular and unlabored, clear to auscultation bilaterally. GI: Soft, nontender, nondistended, BS + x 4. MS: no deformity or atrophy. Skin: warm and dry, no rash. Neuro:  Strength and sensation are intact. Psych: Normal affect.  Accessory Clinical Findings    ECG personally reviewed by me today- *** - No acute changes  Lab Results  Component Value Date   WBC 6.2 12/02/2021   HGB 13.1 12/02/2021   HCT 40.4 12/02/2021   MCV 88 12/02/2021   PLT 168 12/02/2021   Lab Results  Component Value Date   CREATININE 1.11 (H) 12/02/2021   BUN 12 12/02/2021   NA 147 (H) 12/02/2021   K 4.2 12/02/2021   CL 107 (H) 12/02/2021   CO2 24 12/02/2021   Lab Results  Component Value Date   ALT 9 12/02/2021   AST 16 12/02/2021   ALKPHOS 77 12/02/2021   BILITOT 0.4 12/02/2021   Lab Results  Component Value Date   CHOL 221 (H) 05/13/2019   HDL 48 05/13/2019   LDLCALC 148 (H) 05/13/2019   TRIG 124 05/13/2019   CHOLHDL 4.6 05/13/2019    No results found for: "HGBA1C"  Review of Prior Studies:   Assessment & Plan   1.  ***    Current medicines are reviewed at length  with the patient today.  I have spent *** min's  dedicated to the care of this patient on the date of this encounter to include  pre-visit review of records, assessment, management and diagnostic testing,with shared decision making. Signed, Phill Myron. West Pugh, ANP, AACC   01/05/2022 1:17 PM      Office [phone] Fax [phone]  Notice: This dictation was prepared with Dragon dictation along with smaller phrase technology. Any transcriptional errors that result from this process are unintentional and may not be corrected upon review.

## 2022-01-06 ENCOUNTER — Ambulatory Visit: Payer: Medicare HMO | Admitting: Adult Health

## 2022-01-11 NOTE — Progress Notes (Unsigned)
Cardiology Office Note:    Date:  01/12/2022   ID:  Bridget [name], DOB 05-31-1940, MRN [id number]  PCP:  Marylen Ponto, MD   [facility] Providers Cardiologist:  Chrystie Nose, MD {    Referring MD: Marylen Ponto, MD   Chief Complaint  Patient presents with   Follow-up    PAF.    History of Present Illness:    Bridget [name] is a 81 y.o. 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  Echocardiogram 04/2019 showed LVEF 30-35% which thankfully normalized on follow-up echocardiogram with rate control.  Tachycardia mediated cardiomyopathy was suspected.  During a follow-up visit 12/02/2021 she was in A-fib RVR with a rate of 130 bpm.  She was started on amiodarone p.o. load, metoprolol 25 mg twice daily, and planned for DCCV on 12/07/2021.  Her family called the following day on 12/03/2021 and reported regular heart rate in the 70s.  Metoprolol was held, amiodarone was continued.  She was seen back in follow-up 12/09/2021 and was in normal sinus rhythm.  Unfortunately she did not feel better in regular rhythm.  Amiodarone 200 mg twice daily x7 more days then 200 mg daily planned.  She was also on Cardizem 240 mg daily.  Beta-blocker continued to be held.  Unfortunately she continued to be disoriented even on reduced dose of amiodarone.  Dr. Rennis Golden recommended stopping amiodarone with close follow-up.  She unfortunately was not able to make her follow-up appointment with Joni Reining and was placed on my schedule.  She presents today for follow-up.  EKG today shows regular rhythm. Last dose of amiodarone was 12/13/21 (was on for less than 1 month). She is starting to feel better but not back to baseline.   They have not been able to get the 240 mg long acting cardizem  (tiazac). I have reordered a generic. They will call if they have issues. They have been using the short acting cardizem every 6 hrs. They are only using lopressor 25 mg PRN and hasn't been taking it.    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    Past Surgical History:  Procedure Laterality Date   ATRIAL FIBRILLATION ABLATION N/A 12/16/2019   Procedure: ATRIAL FIBRILLATION ABLATION;  Surgeon: Hillis Range, MD;  Location: [facility];  Service: Cardiovascular;  Laterality: N/A;   CARDIOVERSION N/A 05/02/2019   Procedure: CARDIOVERSION;  Surgeon: Chrystie Nose, MD;  Location: [facility] ENDOSCOPY;  Service: Cardiovascular;  Laterality: N/A;   CARDIOVERSION N/A 05/14/2019   Procedure: CARDIOVERSION;  Surgeon: Parke Poisson, MD;  Location: [facility] ENDOSCOPY;  Service: Cardiovascular;  Laterality: N/A;   CATARACT EXTRACTION, BILATERAL     CERVICAL DISCECTOMY  2005   with fusion   CHOLECYSTECTOMY  2006   COLONOSCOPY  LUMBAR LAMINECTOMY/DECOMPRESSION MICRODISCECTOMY Right 11/13/2014   Procedure: Right Lumbar four-five Microdiskectomy;  Surgeon: Hilda Lias, MD;  Location: [facility] NEURO ORS;  Service: Neurosurgery;  Laterality: Right;  right   POLYPECTOMY     REPLACEMENT TOTAL KNEE Right 05/2012   TEE WITHOUT CARDIOVERSION N/A 05/02/2019   Procedure: TRANSESOPHAGEAL ECHOCARDIOGRAM (TEE);  Surgeon: Chrystie Nose, MD;  Location: [facility] ENDOSCOPY;  Service: Cardiovascular;  Laterality: N/A;   TOTAL  ABDOMINAL HYSTERECTOMY  1978   TOTAL HIP ARTHROPLASTY Left 04/2011   TRANSTHORACIC ECHOCARDIOGRAM  12/2008   EF=>55%, borderline conc LVH; trace MR; mod TR; trace AV regurg    Current Medications: Current Meds  Medication Sig   b complex vitamins tablet Take 1 tablet by mouth daily. With vitamin c 500 mg   baclofen (LIORESAL) 10 MG tablet Take 0.5 tablets by mouth as needed (as needed for migraine pain and neuropathy.).    Cholecalciferol 25 MCG (1000 UT) tablet Take 1,000 Units by mouth daily.    Dextran 70-Hypromellose (ARTIFICIAL TEARS PF OP) Place 1 drop into both eyes daily as needed (For dry eyes).   diltiazem (CARDIZEM LA) 240 MG 24 hr tablet Take 1 tablet (240 mg total) by mouth daily.   ELIQUIS 5 MG TABS tablet TAKE 1 TABLET BY MOUTH TWICE A DAY   furosemide (LASIX) 20 MG tablet Take 2 tablets (40 mg total) by mouth daily as needed for edema (weight gain and/or swelling).   gabapentin (NEURONTIN) 600 MG tablet Take 300 mg by mouth at bedtime.   irbesartan (AVAPRO) 75 MG tablet Take 1 tablet (75 mg total) by mouth daily.   levothyroxine (SYNTHROID, LEVOTHROID) 75 MCG tablet Take 75 mcg by mouth daily before breakfast.   metoprolol tartrate (LOPRESSOR) 25 MG tablet Take 1 tablet (25 mg total) by mouth 2 (two) times daily.   potassium chloride SA (KLOR-CON M20) 20 MEQ tablet TAKE 1 TABLET (20 MEQ TOTAL) BY MOUTH AS NEEDED (TAKE ON DAYS LASIX IS TAKEN).   promethazine (PHENERGAN) 25 MG tablet Take 25 mg by mouth every 6 (six) hours as needed for nausea.   rosuvastatin (CRESTOR) 20 MG tablet TAKE 1 TABLET BY MOUTH DAILY AT 6 PM.   sertraline (ZOLOFT) 25 MG tablet Take 25 mg by mouth daily.   traMADol (ULTRAM) 50 MG tablet Take 50-100 mg by mouth every 4 (four) hours as needed for moderate pain (as needed for migraine pain and neuropathy.).    UBRELVY 100 MG TABS TAKE 100 MG BY MOUTH EVERY 2 (TWO) HOURS AS NEEDED (MIGRAINE.). MAXIMUM 200MG  A DAY.   zinc gluconate 50 MG tablet Take 50 mg  by mouth daily.   [DISCONTINUED] diltiazem (TIAZAC) 240 MG 24 hr capsule Take 1 capsule (240 mg total) by mouth daily.     Allergies:   Codeine, Sulfonamide derivatives, Triptans, Amlodipine, Chocolate, Onion, and Other   Social History   Socioeconomic History   Marital status: Married    Spouse name: Not on file   Number of children: 2   Years of education: 13   Highest education level: Not on file  Occupational History   Occupation: retired    Comment: medical records admin  Tobacco Use   Smoking status: Never   Smokeless tobacco: Never  Vaping Use   Vaping Use: Never used  Substance and Sexual Activity   Alcohol use: No   Drug use: No   Sexual activity: Not on file  Other Topics Concern   Not on file  Social History Narrative  Lives at home with husband   Right handed   Caffeine: maybe 1 cup/day   Social Determinants of Health   Financial Resource Strain: Not on file  Food Insecurity: Not on file  Transportation Needs: Not on file  Physical Activity: Not on file  Stress: Not on file  Social Connections: Not on file     Family History: The patient's family history includes Breast cancer in her paternal grandmother; Colon cancer in her cousin; Colon polyps in her father; Coronary artery disease (age of onset: 56) in her father; Esophageal cancer in her cousin; Stroke in her maternal grandfather and paternal grandmother; Stroke (age of onset: 65) in her mother; Suicidality in her daughter. There is no history of Rectal cancer, Stomach cancer, Tremor, Parkinson's disease, Alzheimer's disease, or Dementia.  ROS:   Please see the history of present illness.     All other systems reviewed and are negative.  EKGs/Labs/Other Studies Reviewed:    The following studies were reviewed today:  Echo 02/2021: 1. Left ventricular ejection fraction, by estimation, is 55 to 60%. The  left ventricle has normal function. The left ventricle has no regional  wall motion  abnormalities. Left ventricular diastolic parameters are  consistent with Grade II diastolic  dysfunction (pseudonormalization).   2. Right ventricular systolic function is normal. The right ventricular  size is normal. There is normal pulmonary artery systolic pressure. The  estimated right ventricular systolic pressure is 30.2 mmHg.   3. Left atrial size was mildly dilated.   4. The mitral valve is grossly normal. Trivial mitral valve  regurgitation. No evidence of mitral stenosis.   5. The aortic valve is tricuspid. There is mild calcification of the  aortic valve. Aortic valve regurgitation is moderate. No aortic stenosis  is present.   6. There is mild dilatation of the ascending aorta, measuring 41 mm.   7. The inferior vena cava is normal in size with greater than 50%  respiratory variability, suggesting right atrial pressure of 3 mmHg.   EKG:  EKG is  ordered today.  The ekg ordered today demonstrates sinus rhythm with HR 85, poor R wave progression  Recent Labs: 02/28/2021: BNP 56.2 12/02/2021: ALT 9; BUN 12; Creatinine, Ser 1.11; Hemoglobin 13.1; Platelets 168; Potassium 4.2; Sodium 147; TSH 2.860  Recent Lipid Panel    Component Value Date/Time   CHOL 221 (H) 05/13/2019 0600   CHOL 179 05/22/2018 1023   CHOL 146 04/14/2014 0952   TRIG 124 05/13/2019 0600   TRIG 94 04/14/2014 0952   HDL 48 05/13/2019 0600   HDL 68 05/22/2018 1023   HDL 59 04/14/2014 0952   CHOLHDL 4.6 05/13/2019 0600   VLDL 25 05/13/2019 0600   LDLCALC 148 (H) 05/13/2019 0600   LDLCALC 88 05/22/2018 1023   LDLCALC 68 04/14/2014 0952     Risk Assessment/Calculations:    CHA2DS2-VASc Score = 5 This indicates a 7.2% annual risk of stroke. The patient's score is based upon: CHF History: 1 HTN History: 1 Diabetes History: 0 Stroke History: 0 Vascular Disease History: 0 Age Score: 2 Gender Score: 1        Physical Exam:    VS:  BP 110/62 (BP Location: Left Arm, Patient Position: Sitting,  Cuff Size: Normal)   Pulse 85   Ht 5\' 6"  (1.676 m)   Wt 168 lb (76.2 kg)   LMP  (LMP Unknown)   BMI 27.12 kg/m     Wt Readings from Last 3 Encounters:  01/12/22  168 lb (76.2 kg)  12/09/21 167 lb 14.4 oz (76.2 kg)  12/02/21 163 lb 9.6 oz (74.2 kg)     GEN:  Well nourished, well developed in no acute distress HEENT: Normal NECK: No JVD; No carotid bruits LYMPHATICS: No lymphadenopathy CARDIAC: RRR, no murmurs, rubs, gallops RESPIRATORY:  Clear to auscultation without rales, wheezing or rhonchi  ABDOMEN: Soft, non-tender, non-distended MUSCULOSKELETAL:  No edema; No deformity  SKIN: Warm and dry NEUROLOGIC:  Alert and oriented x 3 PSYCHIATRIC:  Normal affect   ASSESSMENT:    1. Paroxysmal atrial fibrillation (HCC)   2. Chronic anticoagulation   3. Tachymediated cardiomyopathy   4. Nonischemic cardiomyopathy (HCC)   5. Primary hypertension    PLAN:    In order of problems listed above:  Persistent atrial fibrillation/atrial flutter S/p ablation - 2021 She did not tolerate amiodarone Will reorder 240 mg Cardizem daily Beta-blocker held for heart rates in the 70s - using lopressor PRN and has not required. Discussed not missing doses of eliquis and possible room for DCCV should they come to [facility] ER with RVR not controlled with PRN medication.  Discussed that she will need a long amiodarone washout before starting another antiarrhythmic. Will refer back to EP.    Chronic anticoagulation 5 mg Eliquis twice daily -this is appropriately dosed although we need to closely follow renal function.   Tachycardia mediated cardiomyopathy History of systolic and diastolic heart failure Hypertension She remains on Entresto and spironolactone I do not think she will tolerate being in A-fib RVR for very long given her history of cardiomyopathy. She is in regular rhythm today.    Refer to EP for antiarrhythmic vs ablation options.       Medication Adjustments/Labs and Tests  Ordered: Current medicines are reviewed at length with the patient today.  Concerns regarding medicines are outlined above.  Orders Placed This Encounter  Procedures   Ambulatory referral to Cardiac Electrophysiology   EKG 12-Lead   Meds ordered this encounter  Medications   diltiazem (CARDIZEM LA) 240 MG 24 hr tablet    Sig: Take 1 tablet (240 mg total) by mouth daily.    Dispense:  90 tablet    Refill:  3    There are no Patient Instructions on file for this visit.   Signed, Roe Rutherford Carolan Avedisian, PA  01/12/2022 3:12 PM    [facility]

## 2022-01-12 ENCOUNTER — Ambulatory Visit: Payer: Medicare HMO | Attending: Adult Health | Admitting: Physician Assistant

## 2022-01-12 ENCOUNTER — Encounter: Payer: Self-pay | Admitting: Physician Assistant

## 2022-01-12 ENCOUNTER — Other Ambulatory Visit: Payer: Self-pay | Admitting: Physician Assistant

## 2022-01-12 VITALS — BP 110/62 | HR 85 | Ht 66.0 in | Wt 168.0 lb

## 2022-01-12 DIAGNOSIS — Z7901 Long term (current) use of anticoagulants: Secondary | ICD-10-CM | POA: Diagnosis not present

## 2022-01-12 DIAGNOSIS — I10 Essential (primary) hypertension: Secondary | ICD-10-CM

## 2022-01-12 DIAGNOSIS — I480 Paroxysmal atrial fibrillation: Secondary | ICD-10-CM

## 2022-01-12 DIAGNOSIS — I428 Other cardiomyopathies: Secondary | ICD-10-CM | POA: Diagnosis not present

## 2022-01-12 MED ORDER — DILTIAZEM HCL ER 240 MG PO TB24: 240.0000 mg | ORAL_TABLET | Freq: Every day | ORAL | 3 refills | Status: DC

## 2022-01-12 NOTE — Patient Instructions (Signed)
Medication Instructions:  Stop Tiazac. Start Cardizem LA 240 mg ( Take 1 Tablet Daily). *If you need a refill on your cardiac medications before your next appointment, please call your pharmacy*   Lab Work: No Labs If you have labs (blood work) drawn today and your tests are completely normal, you will receive your results only by: [place] (if you have MyChart) OR A paper copy in the mail If you have any lab test that is abnormal or we need to change your treatment, we will call you to review the results.   Testing/Procedures: No Testing    Follow-Up: At [facility], you and your health needs are our priority.  As part of our continuing mission to provide you with exceptional heart care, we have created designated Provider Care Teams.  These Care Teams include your primary Cardiologist (physician) and Advanced Practice Providers (APPs -  Physician Assistants and Nurse Practitioners) who all work together to provide you with the care you need, when you need it.  We recommend signing up for the patient portal called "MyChart".  Sign up information is provided on this After Visit Summary.  MyChart is used to connect with patients for Virtual Visits (Telemedicine).  Patients are able to view lab/test results, encounter notes, upcoming appointments, etc.  Non-urgent messages can be sent to your provider as well.   To learn more about what you can do with MyChart, go to [email or web address].    Your next appointment:   6 month(s)  The format for your next appointment:   In Person  Provider:   Pixie Casino, MD

## 2022-01-12 NOTE — Telephone Encounter (Signed)
Pt's pharmacy is requesting a prior auth for medication diltiazem or alternative for this medication. Please address

## 2022-01-16 NOTE — Telephone Encounter (Signed)
**Note De-Identified  Obfuscation** I called [facility] and was advised that the pts plan covers Diltiazem 240 mg CD or ER version but not the LA version of the med. Forwarding to Dr Debara Pickett and Fabian Sharp, NP for advisement.

## 2022-01-16 NOTE — Telephone Encounter (Signed)
Pt's pharmacy is stating that pt needs a prior auth for medication diltiazem (cardizem LA) 240 mg tablets or doctor can prescribe an alternative like verapamil, there is a list of alternatives, if doctor wants to switch. Please advise thanks

## 2022-01-17 ENCOUNTER — Other Ambulatory Visit: Payer: Self-pay | Admitting: Internal Medicine

## 2022-01-17 ENCOUNTER — Ambulatory Visit: Payer: Medicare HMO | Attending: Cardiology | Admitting: Cardiology

## 2022-01-17 ENCOUNTER — Encounter: Payer: Self-pay | Admitting: Cardiology

## 2022-01-17 ENCOUNTER — Ambulatory Visit: Payer: Medicare HMO | Admitting: Cardiology

## 2022-01-17 VITALS — BP 92/56 | HR 70 | Ht 66.0 in | Wt 155.6 lb

## 2022-01-17 DIAGNOSIS — I4819 Other persistent atrial fibrillation: Secondary | ICD-10-CM

## 2022-01-17 MED ORDER — DILTIAZEM HCL ER COATED BEADS 240 MG PO CP24: 240.0000 mg | ORAL_CAPSULE | Freq: Every day | ORAL | 3 refills | Status: DC

## 2022-01-17 NOTE — Progress Notes (Signed)
Electrophysiology Office Note:    Date:  01/17/2022   ID:  Bridget [name], DOB 1940-11-24, MRN [id number]  PCP:  Marylen Ponto, MD  [facility] Cardiologist:  Chrystie Nose, MD  [facility] Electrophysiologist:  Lanier Prude, MD   Referring MD: Marylen Ponto, MD   Chief Complaint: AF  History of Present Illness:    Bridget [name] is a 81 y.o. female who presents for an evaluation of AF at the request of Dr Leonor Liv. Their medical history includes atrial fibrillation, hypertension, hyperlipidemia, hypothyroidism. She did not tolerate amiodarone in the past due to trouble with memory.  Today, she presents feeling well overall. She has been compliant with her medications and is taking her blood pressure three times a day (in the morning, lunch time, and before bed).   She denies any palpitations, chest pain, shortness of breath, or peripheral edema. No lightheadedness, headaches, syncope, orthopnea, or PND.    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    Past Surgical History:  Procedure Laterality Date   ATRIAL FIBRILLATION ABLATION N/A 12/16/2019   Procedure: ATRIAL FIBRILLATION ABLATION;  Surgeon: Hillis Range, MD;  [facility];  Service: Cardiovascular;  Laterality: N/A;   CARDIOVERSION N/A 05/02/2019   Procedure: CARDIOVERSION;  Surgeon: Chrystie Nose, MD;  Location: [facility] ENDOSCOPY;  Service: Cardiovascular;  Laterality: N/A;   CARDIOVERSION N/A 05/14/2019    Procedure: CARDIOVERSION;  Surgeon: Parke Poisson, MD;  Location: [facility] ENDOSCOPY;  Service: Cardiovascular;  Laterality: N/A;   CATARACT EXTRACTION, BILATERAL     CERVICAL DISCECTOMY  2005   with fusion   CHOLECYSTECTOMY  2006   COLONOSCOPY     LUMBAR LAMINECTOMY/DECOMPRESSION MICRODISCECTOMY Right 11/13/2014   Procedure: Right Lumbar four-five Microdiskectomy;  Surgeon: Hilda Lias, MD;  Location: [facility] NEURO ORS;  Service: Neurosurgery;  Laterality: Right;  right   POLYPECTOMY     REPLACEMENT TOTAL KNEE Right 05/2012   TEE WITHOUT CARDIOVERSION N/A 05/02/2019   Procedure: TRANSESOPHAGEAL ECHOCARDIOGRAM (TEE);  Surgeon: Chrystie Nose, MD;  Location: [facility] ENDOSCOPY;  Service: Cardiovascular;  Laterality: N/A;   TOTAL ABDOMINAL HYSTERECTOMY  1978   TOTAL HIP ARTHROPLASTY Left 04/2011   TRANSTHORACIC ECHOCARDIOGRAM  12/2008   EF=>55%, borderline conc LVH; trace MR; mod TR; trace AV regurg    Current Medications: Current Meds  Medication Sig   b complex vitamins tablet Take 1 tablet by mouth daily. With vitamin c 500 mg   baclofen (LIORESAL) 10 MG tablet Take 0.5 tablets by mouth as needed (as needed for migraine pain and neuropathy.).    Cholecalciferol 25 MCG (1000 UT) tablet Take 1,000 Units by mouth daily.    Dextran 70-Hypromellose (ARTIFICIAL TEARS PF OP) Place 1 drop into both eyes daily as needed (For dry eyes).   diltiazem (CARDIZEM CD) 240 MG 24 hr capsule Take 1 capsule (240 mg total) by mouth daily.  ELIQUIS 5 MG TABS tablet TAKE 1 TABLET BY MOUTH TWICE A DAY   furosemide (LASIX) 20 MG tablet Take 2 tablets (40 mg total) by mouth daily as needed for edema (weight gain and/or swelling).   gabapentin (NEURONTIN) 600 MG tablet Take 300 mg by mouth at bedtime.   irbesartan (AVAPRO) 75 MG tablet Take 1 tablet (75 mg total) by mouth daily.   levothyroxine (SYNTHROID, LEVOTHROID) 75 MCG tablet Take 75 mcg by mouth daily before breakfast.   metoprolol tartrate (LOPRESSOR) 25 MG  tablet Take 1 tablet (25 mg total) by mouth 2 (two) times daily.   potassium chloride SA (KLOR-CON M20) 20 MEQ tablet TAKE 1 TABLET (20 MEQ TOTAL) BY MOUTH AS NEEDED (TAKE ON DAYS LASIX IS TAKEN).   promethazine (PHENERGAN) 25 MG tablet Take 25 mg by mouth every 6 (six) hours as needed for nausea.   rosuvastatin (CRESTOR) 20 MG tablet TAKE 1 TABLET BY MOUTH DAILY AT 6 PM.   sertraline (ZOLOFT) 25 MG tablet Take 25 mg by mouth daily.   traMADol (ULTRAM) 50 MG tablet Take 50-100 mg by mouth every 4 (four) hours as needed for moderate pain (as needed for migraine pain and neuropathy.).    UBRELVY 100 MG TABS TAKE 100 MG BY MOUTH EVERY 2 (TWO) HOURS AS NEEDED (MIGRAINE.). MAXIMUM 200MG  A DAY.   zinc gluconate 50 MG tablet Take 50 mg by mouth daily.     Allergies:   Codeine, Sulfonamide derivatives, Triptans, Amlodipine, Chocolate, Onion, and Other   Social History   Socioeconomic History   Marital status: Married    Spouse name: Not on file   Number of children: 2   Years of education: 13   Highest education level: Not on file  Occupational History   Occupation: retired    Comment: medical records admin  Tobacco Use   Smoking status: Never   Smokeless tobacco: Never  Vaping Use   Vaping Use: Never used  Substance and Sexual Activity   Alcohol use: No   Drug use: No   Sexual activity: Not on file  Other Topics Concern   Not on file  Social History Narrative   Lives at home with husband   Right handed   Caffeine: maybe 1 cup/day   Social Determinants of Health   Financial Resource Strain: Not on file  Food Insecurity: Not on file  Transportation Needs: Not on file  Physical Activity: Not on file  Stress: Not on file  Social Connections: Not on file     Family History: The patient's family history includes Breast cancer in her paternal grandmother; Colon cancer in her cousin; Colon polyps in her father; Coronary artery disease (age of onset: 28) in her father; Esophageal  cancer in her cousin; Stroke in her maternal grandfather and paternal grandmother; Stroke (age of onset: 51) in her mother; Suicidality in her daughter. There is no history of Rectal cancer, Stomach cancer, Tremor, Parkinson's disease, Alzheimer's disease, or Dementia.  ROS:   Please see the history of present illness.    All other systems reviewed and are negative.  EKGs/Labs/Other Studies Reviewed:    The following studies were reviewed today:  EKG:   01/17/2022: The ekg ordered today demonstrates sinus rhythm. Prolonged QT ~   Recent Labs: 02/28/2021: BNP 56.2 12/02/2021: ALT 9; BUN 12; Creatinine, Ser 1.11; Hemoglobin 13.1; Platelets 168; Potassium 4.2; Sodium 147; TSH 2.860  Recent Lipid Panel    Component Value Date/Time   CHOL 221 (H)  05/13/2019 0600   CHOL 179 05/22/2018 1023   CHOL 146 04/14/2014 0952   TRIG 124 05/13/2019 0600   TRIG 94 04/14/2014 0952   HDL 48 05/13/2019 0600   HDL 68 05/22/2018 1023   HDL 59 04/14/2014 0952   CHOLHDL 4.6 05/13/2019 0600   VLDL 25 05/13/2019 0600   LDLCALC 148 (H) 05/13/2019 0600   LDLCALC 88 05/22/2018 1023   LDLCALC 68 04/14/2014 0952    Physical Exam:    VS:  BP (!) 92/56   Pulse 70   Ht 5\' 6"  (1.676 m)   Wt 155 lb 9.6 oz (70.6 kg)   LMP  (LMP Unknown)   SpO2 96%   BMI 25.11 kg/m     Wt Readings from Last 3 Encounters:  01/17/22 155 lb 9.6 oz (70.6 kg)  01/12/22 168 lb (76.2 kg)  12/09/21 167 lb 14.4 oz (76.2 kg)     GEN: Well nourished, well developed in no acute distress HEENT: Normal NECK: No JVD; No carotid bruits LYMPHATICS: No lymphadenopathy CARDIAC: RRR, no murmurs, rubs, gallops RESPIRATORY:  Clear to auscultation without rales, wheezing or rhonchi  ABDOMEN: Soft, non-tender, non-distended MUSCULOSKELETAL:  No edema; No deformity  SKIN: Warm and dry NEUROLOGIC:  Alert and oriented x 3 PSYCHIATRIC:  Normal affect       ASSESSMENT:    1. Persistent atrial fibrillation (HCC)    PLAN:     In order of problems listed above:  #Persistent AF Symptomatic but now doing well off amiodarone without recurrence of AF. Discussed antiarrhythmic drug therapy extensively during today's visit. Given she did not tolerate amiodarone, would need to consider dofetilide in the future. Before planning for admission, woul dneed to repeat ECG to confirm her QTc has improved. I suspect it is prolonged today due to recent amiodarone use.  She will continue apixaban for stroke ppx. Continue cardizem and metoprolol.   Plan to follow up in 6 months to reassess AF control off amiodarone.    Medication Adjustments/Labs and Tests Ordered: Current medicines are reviewed at length with the patient today.  Concerns regarding medicines are outlined above.  Orders Placed This Encounter  Procedures   EKG 12-Lead   No orders of the defined types were placed in this encounter.  I, Lanier Prude, MD, have reviewed all documentation for this visit. The documentation on 01/17/22 for the exam, diagnosis, procedures, and orders are all accurate and complete.   Signed, Rossie Muskrat. Lalla Brothers, MD, [facility], [facility] 01/17/2022 8:33 PM    Electrophysiology [facility]

## 2022-01-17 NOTE — Progress Notes (Deleted)
Electrophysiology Office Follow up Visit Note:    Date:  01/17/2022   ID:  Bridget [name], DOB Feb 13, 1941, MRN [id number]  PCP:  Marylen Ponto, MD  [facility] Cardiologist:  Chrystie Nose, MD  [facility] Electrophysiologist:  None    Interval History:    Bridget [name] is a 81 y.o. female who presents for a follow up visit. They were last seen in clinic by Dr Johney Frame 12/29/2020. At that appointment she was doing well off AAD therapy. She recently saw Angie on 01/12/2022. She had another appt in 11/2021 during which she was in AF w/ RVR. She was briefly on amiodarone but this was stopped due to off-target effects. She takes Eliquis for stroke ppx.  Her ablation was 12/16/2019. During the initial ablation, the veins and posterior wall and CTI were ablated.     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    Past Surgical History:  Procedure Laterality Date   ATRIAL FIBRILLATION ABLATION N/A 12/16/2019   Procedure: ATRIAL FIBRILLATION ABLATION;  Surgeon: Hillis Range, MD;  Location: [facility] INVASIVE CV LAB;  Service: Cardiovascular;  Laterality: N/A;   CARDIOVERSION N/A 05/02/2019   Procedure: CARDIOVERSION;  Surgeon: Chrystie Nose, MD;  Location: [facility] ENDOSCOPY;  Service: Cardiovascular;  Laterality: N/A;   CARDIOVERSION N/A 05/14/2019   Procedure: CARDIOVERSION;  Surgeon: Parke Poisson, MD;  Location: [facility] ENDOSCOPY;  Service: Cardiovascular;  Laterality: N/A;   CATARACT EXTRACTION,  BILATERAL     CERVICAL DISCECTOMY  2005   with fusion   CHOLECYSTECTOMY  2006   COLONOSCOPY     LUMBAR LAMINECTOMY/DECOMPRESSION MICRODISCECTOMY Right 11/13/2014   Procedure: Right Lumbar four-five Microdiskectomy;  Surgeon: Hilda Lias, MD;  Location: [facility] NEURO ORS;  Service: Neurosurgery;  Laterality: Right;  right   POLYPECTOMY     REPLACEMENT TOTAL KNEE Right 05/2012   TEE WITHOUT CARDIOVERSION N/A 05/02/2019   Procedure: TRANSESOPHAGEAL ECHOCARDIOGRAM (TEE);  Surgeon: Chrystie Nose, MD;  Location: [facility] ENDOSCOPY;  Service: Cardiovascular;  Laterality: N/A;   TOTAL ABDOMINAL HYSTERECTOMY  1978   TOTAL HIP ARTHROPLASTY Left 04/2011   TRANSTHORACIC ECHOCARDIOGRAM  12/2008   EF=>55%, borderline conc LVH; trace MR; mod TR; trace AV regurg    Current Medications: No outpatient medications have been marked as taking for the 01/17/22 encounter (Appointment) with Lanier Prude, MD.     Allergies:   Codeine, Sulfonamide derivatives, Triptans, Amlodipine, Chocolate, Onion, and Other   Social History   Socioeconomic History   Marital status: Married    Spouse name: Not on file   Number of children: 2   Years of education: 13   Highest education level: Not on file  Occupational History   Occupation: retired    Comment: medical records admin  Tobacco Use   Smoking status: Never   Smokeless tobacco: Never  Vaping Use   Vaping Use: Never used  Substance and Sexual Activity  Alcohol use: No   Drug use: No   Sexual activity: Not on file  Other Topics Concern   Not on file  Social History Narrative   Lives at home with husband   Right handed   Caffeine: maybe 1 cup/day   Social Determinants of Health   Financial Resource Strain: Not on file  Food Insecurity: Not on file  Transportation Needs: Not on file  Physical Activity: Not on file  Stress: Not on file  Social Connections: Not on file     Family History: The patient's family history includes Breast cancer in  her paternal grandmother; Colon cancer in her cousin; Colon polyps in her father; Coronary artery disease (age of onset: 59) in her father; Esophageal cancer in her cousin; Stroke in her maternal grandfather and paternal grandmother; Stroke (age of onset: 12) in her mother; Suicidality in her daughter. There is no history of Rectal cancer, Stomach cancer, Tremor, Parkinson's disease, Alzheimer's disease, or Dementia.  ROS:   Please see the history of present illness.    All other systems reviewed and are negative.  EKGs/Labs/Other Studies Reviewed:    The following studies were reviewed today:  03/17/2021 Echo EF 55 RV normal Trivial MR  01/12/2022 ECG shows sinus rhythm    Recent Labs: 02/28/2021: BNP 56.2 12/02/2021: ALT 9; BUN 12; Creatinine, Ser 1.11; Hemoglobin 13.1; Platelets 168; Potassium 4.2; Sodium 147; TSH 2.860  Recent Lipid Panel    Component Value Date/Time   CHOL 221 (H) 05/13/2019 0600   CHOL 179 05/22/2018 1023   CHOL 146 04/14/2014 0952   TRIG 124 05/13/2019 0600   TRIG 94 04/14/2014 0952   HDL 48 05/13/2019 0600   HDL 68 05/22/2018 1023   HDL 59 04/14/2014 0952   CHOLHDL 4.6 05/13/2019 0600   VLDL 25 05/13/2019 0600   LDLCALC 148 (H) 05/13/2019 0600   LDLCALC 88 05/22/2018 1023   LDLCALC 68 04/14/2014 0952    Physical Exam:    VS:  LMP  (LMP Unknown)     Wt Readings from Last 3 Encounters:  01/12/22 168 lb (76.2 kg)  12/09/21 167 lb 14.4 oz (76.2 kg)  12/02/21 163 lb 9.6 oz (74.2 kg)     GEN: *** Well nourished, well developed in no acute distress HEENT: Normal NECK: No JVD; No carotid bruits LYMPHATICS: No lymphadenopathy CARDIAC: ***RRR, no murmurs, rubs, gallops RESPIRATORY:  Clear to auscultation without rales, wheezing or rhonchi  ABDOMEN: Soft, non-tender, non-distended MUSCULOSKELETAL:  No edema; No deformity  SKIN: Warm and dry NEUROLOGIC:  Alert and oriented x 3 PSYCHIATRIC:  Normal affect        ASSESSMENT:    No  diagnosis found. PLAN:    In order of problems listed above:   #persistent AF Symptomatic. Associated with tachy-mediated systolic heart failure. Rhythm control indicated. Did not tolerate amiodarone.  ***redo ablation     Total time spent with patient today *** minutes. This includes reviewing records, evaluating the patient and coordinating care.   Medication Adjustments/Labs and Tests Ordered: Current medicines are reviewed at length with the patient today.  Concerns regarding medicines are outlined above.  No orders of the defined types were placed in this encounter.  No orders of the defined types were placed in this encounter.    Signed, Steffanie Dunn, MD, [facility], [facility] 01/17/2022 7:12 AM    Electrophysiology [facility]

## 2022-01-17 NOTE — Patient Instructions (Signed)
Medication Instructions:  None  *If you need a refill on your cardiac medications before your next appointment, please call your pharmacy*   Lab Work: None  If you have labs (blood work) drawn today and your tests are completely normal, you will receive your results only by: MyChart Message (if you have MyChart) OR A paper copy in the mail If you have any lab test that is abnormal or we need to change your treatment, we will call you to review the results.   Testing/Procedures: None    Follow-Up: At [facility], you and your health needs are our priority.  As part of our continuing mission to provide you with exceptional heart care, we have created designated Provider Care Teams.  These Care Teams include your primary Cardiologist (physician) and Advanced Practice Providers (APPs -  Physician Assistants and Nurse Practitioners) who all work together to provide you with the care you need, when you need it.  We recommend signing up for the patient portal called "MyChart".  Sign up information is provided on this After Visit Summary.  MyChart is used to connect with patients for Virtual Visits (Telemedicine).  Patients are able to view lab/test results, encounter notes, upcoming appointments, etc.  Non-urgent messages can be sent to your provider as well.   To learn more about what you can do with MyChart, go to [email or web address].    Your next appointment:   6 month(s)  The format for your next appointment:   In Person  Provider:   You will see one of the following Advanced Practice Providers on your designated Care Team:   Renee Ursuy, PA-C Michael "Andy" Tillery, PA-C      Other Instructions None   Important Information About Sugar      

## 2022-01-17 NOTE — Progress Notes (Incomplete)
Electrophysiology Office Follow up Visit Note:    Date:  01/17/2022   ID:  Bridget [name], DOB 06-21-1940, MRN [id number]  PCP:  Marylen Ponto, MD  [facility] Cardiologist:  Chrystie Nose, MD  [facility] Electrophysiologist:  Lanier Prude, MD    Interval History:    Bridget [name] is a 81 y.o. female who presents for a follow up visit. They were last seen in clinic by Dr Johney Frame 12/29/2020. At that appointment she was doing well off AAD therapy. She recently saw Angie on 01/12/2022. She had another appt in 11/2021 during which she was in AF w/ RVR. She was briefly on amiodarone but this was stopped due to off-target effects. She takes Eliquis for stroke ppx.  Her ablation was 12/16/2019. During the initial ablation, the veins and posterior wall and CTI were ablated.  Today,   She denies any palpitations, chest pain, shortness of breath, or peripheral edema. No lightheadedness, headaches, syncope, orthopnea, or PND.  (+)    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    Past Surgical History:  Procedure Laterality Date   ATRIAL FIBRILLATION ABLATION N/A 12/16/2019   Procedure: ATRIAL FIBRILLATION ABLATION;  Surgeon: Hillis Range, MD;  [facility];  Service: Cardiovascular;  Laterality: N/A;   CARDIOVERSION N/A 05/02/2019   Procedure: CARDIOVERSION;  Surgeon: Chrystie Nose, MD;  Location: [facility] ENDOSCOPY;  Service: Cardiovascular;  Laterality: N/A;    CARDIOVERSION N/A 05/14/2019   Procedure: CARDIOVERSION;  Surgeon: Parke Poisson, MD;  Location: [facility] ENDOSCOPY;  Service: Cardiovascular;  Laterality: N/A;   CATARACT EXTRACTION, BILATERAL     CERVICAL DISCECTOMY  2005   with fusion   CHOLECYSTECTOMY  2006   COLONOSCOPY     LUMBAR LAMINECTOMY/DECOMPRESSION MICRODISCECTOMY Right 11/13/2014   Procedure: Right Lumbar four-five Microdiskectomy;  Surgeon: Hilda Lias, MD;  Location: [facility] NEURO ORS;  Service: Neurosurgery;  Laterality: Right;  right   POLYPECTOMY     REPLACEMENT TOTAL KNEE Right 05/2012   TEE WITHOUT CARDIOVERSION N/A 05/02/2019   Procedure: TRANSESOPHAGEAL ECHOCARDIOGRAM (TEE);  Surgeon: Chrystie Nose, MD;  Location: [facility] ENDOSCOPY;  Service: Cardiovascular;  Laterality: N/A;   TOTAL ABDOMINAL HYSTERECTOMY  1978   TOTAL HIP ARTHROPLASTY Left 04/2011   TRANSTHORACIC ECHOCARDIOGRAM  12/2008   EF=>55%, borderline conc LVH; trace MR; mod TR; trace AV regurg    Current Medications: No outpatient medications have been marked as taking for the 01/17/22 encounter (Appointment) with Lanier Prude, MD.     Allergies:   Codeine, Sulfonamide derivatives, Triptans, Amlodipine, Chocolate, Onion, and Other   Social History   Socioeconomic History   Marital status: Married    Spouse name: Not on file   Number of children: 2   Years of education: 13   Highest education level: Not on file  Occupational History   Occupation: retired    Comment: medical records admin  Tobacco Use   Smoking status: Never   Smokeless tobacco: Never  Vaping Use   Vaping Use: Never used  Substance and Sexual Activity   Alcohol use: No   Drug use: No   Sexual activity: Not on file  Other Topics Concern   Not on file  Social History Narrative   Lives at home with husband   Right handed   Caffeine: maybe 1 cup/day   Social Determinants of Health   Financial Resource Strain: Not on file  Food Insecurity: Not on file  Transportation  Needs: Not on file  Physical Activity: Not on file  Stress: Not on file  Social Connections: Not on file     Family History: The patient's family history includes Breast cancer in her paternal grandmother; Colon cancer in her cousin; Colon polyps in her father; Coronary artery disease (age of onset: 44) in her father; Esophageal cancer in her cousin; Stroke in her maternal grandfather and paternal grandmother; Stroke (age of onset: 54) in her mother; Suicidality in her daughter. There is no history of Rectal cancer, Stomach cancer, Tremor, Parkinson's disease, Alzheimer's disease, or Dementia.  ROS:   Please see the history of present illness.     All other systems reviewed and are negative.  EKGs/Labs/Other Studies Reviewed:    The following studies were reviewed today:  03/17/2021 Echo EF 55 RV normal Trivial MR  01/12/2022 ECG shows sinus rhythm  EKG: 01/17/2022: EKG was not ordered.   Recent Labs: 02/28/2021: BNP 56.2 12/02/2021: ALT 9; BUN 12; Creatinine, Ser 1.11; Hemoglobin 13.1; Platelets 168; Potassium 4.2; Sodium 147; TSH 2.860  Recent Lipid Panel    Component Value Date/Time   CHOL 221 (H) 05/13/2019 0600   CHOL 179 05/22/2018 1023   CHOL 146 04/14/2014 0952   TRIG 124 05/13/2019 0600   TRIG 94 04/14/2014 0952   HDL 48 05/13/2019 0600   HDL 68 05/22/2018 1023   HDL 59 04/14/2014 0952   CHOLHDL 4.6 05/13/2019 0600   VLDL 25 05/13/2019 0600   LDLCALC 148 (H) 05/13/2019 0600   LDLCALC 88 05/22/2018 1023   LDLCALC 68 04/14/2014 0952    Physical Exam:    VS:  LMP  (LMP Unknown)     Wt Readings from Last 3 Encounters:  01/12/22 168 lb (76.2 kg)  12/09/21 167 lb 14.4 oz (76.2 kg)  12/02/21 163 lb 9.6 oz (74.2 kg)     GEN: *** Well nourished, well developed in no acute distress HEENT: Normal NECK: No JVD; No carotid bruits LYMPHATICS: No lymphadenopathy CARDIAC: ***RRR, no murmurs, rubs, gallops RESPIRATORY:  Clear to auscultation without rales,  wheezing or rhonchi  ABDOMEN: Soft, non-tender, non-distended MUSCULOSKELETAL:  No edema; No deformity  SKIN: Warm and dry NEUROLOGIC:  Alert and oriented x 3 PSYCHIATRIC:  Normal affect        ASSESSMENT:    1. Persistent atrial fibrillation (HCC)   2. Chronic combined systolic and diastolic CHF (congestive heart failure) (HCC)    PLAN:    In order of problems listed above:   #persistent AF Symptomatic. Associated with tachy-mediated systolic heart failure. Rhythm control indicated. Did not tolerate amiodarone.  ***redo ablation     Total time spent with patient today *** minutes. This includes reviewing records, evaluating the patient and coordinating care.   Medication Adjustments/Labs and Tests Ordered: Current medicines are reviewed at length with the patient today.  Concerns regarding medicines are outlined above.  No orders of the defined types were placed  in this encounter.  No orders of the defined types were placed in this encounter.   I,Rachel Rivera,acting as a scribe for Lanier Prude, MD.,have documented all relevant documentation on the behalf of Lanier Prude, MD,as directed by  Lanier Prude, MD while in the presence of Lanier Prude, MD.  ***  Signed, Steffanie Dunn, MD, [facility], [facility] 01/17/2022 11:02 AM    Electrophysiology [facility]

## 2022-01-17 NOTE — Progress Notes (Deleted)
Electrophysiology Office Note:    Date:  01/17/2022   ID:  Bridget [name], DOB 1940/04/24, MRN [id number]  PCP:  Marylen Ponto, MD  [facility] Cardiologist:  Chrystie Nose, MD  [facility] Electrophysiologist:  Lanier Prude, MD   Referring MD: Marylen Ponto, MD   Chief Complaint: ***  History of Present Illness:    Bridget [name] is a 81 y.o. female who presents for an evaluation of *** at the request of ***. Their medical history includes ***     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    Past Surgical History:  Procedure Laterality Date   ATRIAL FIBRILLATION ABLATION N/A 12/16/2019   Procedure: ATRIAL FIBRILLATION ABLATION;  Surgeon: Hillis Range, MD;  [facility] LAB;  Service: Cardiovascular;  Laterality: N/A;   CARDIOVERSION N/A 05/02/2019   Procedure: CARDIOVERSION;  Surgeon: Chrystie Nose, MD;  Location: [facility] ENDOSCOPY;  Service: Cardiovascular;  Laterality: N/A;   CARDIOVERSION N/A 05/14/2019   Procedure: CARDIOVERSION;  Surgeon: Parke Poisson, MD;  Location: [facility] ENDOSCOPY;  Service: Cardiovascular;  Laterality: N/A;   CATARACT EXTRACTION, BILATERAL     CERVICAL DISCECTOMY  2005   with fusion   CHOLECYSTECTOMY  2006   COLONOSCOPY     LUMBAR LAMINECTOMY/DECOMPRESSION MICRODISCECTOMY Right 11/13/2014   Procedure: Right Lumbar four-five Microdiskectomy;  Surgeon: Hilda Lias, MD;  Location: [facility] NEURO ORS;  Service: Neurosurgery;  Laterality:  Right;  right   POLYPECTOMY     REPLACEMENT TOTAL KNEE Right 05/2012   TEE WITHOUT CARDIOVERSION N/A 05/02/2019   Procedure: TRANSESOPHAGEAL ECHOCARDIOGRAM (TEE);  Surgeon: Chrystie Nose, MD;  Location: [facility] ENDOSCOPY;  Service: Cardiovascular;  Laterality: N/A;   TOTAL ABDOMINAL HYSTERECTOMY  1978   TOTAL HIP ARTHROPLASTY Left 04/2011   TRANSTHORACIC ECHOCARDIOGRAM  12/2008   EF=>55%, borderline conc LVH; trace MR; mod TR; trace AV regurg    Current Medications: Current Meds  Medication Sig   b complex vitamins tablet Take 1 tablet by mouth daily. With vitamin c 500 mg   baclofen (LIORESAL) 10 MG tablet Take 0.5 tablets by mouth as needed (as needed for migraine pain and neuropathy.).    Cholecalciferol 25 MCG (1000 UT) tablet Take 1,000 Units by mouth daily.    Dextran 70-Hypromellose (ARTIFICIAL TEARS PF OP) Place 1 drop into both eyes daily as needed (For dry eyes).   diltiazem (CARDIZEM CD) 240 MG 24 hr capsule Take 1 capsule (240 mg total) by mouth daily.   ELIQUIS 5 MG TABS tablet TAKE 1 TABLET BY MOUTH TWICE A DAY   furosemide (LASIX) 20 MG tablet Take 2 tablets (40 mg total) by mouth daily as needed for edema (weight gain and/or swelling).   gabapentin (NEURONTIN) 600 MG tablet Take 300 mg by mouth at bedtime.   irbesartan (AVAPRO) 75 MG tablet Take 1 tablet (75 mg total) by mouth daily.   levothyroxine (  SYNTHROID, LEVOTHROID) 75 MCG tablet Take 75 mcg by mouth daily before breakfast.   metoprolol tartrate (LOPRESSOR) 25 MG tablet Take 1 tablet (25 mg total) by mouth 2 (two) times daily.   potassium chloride SA (KLOR-CON M20) 20 MEQ tablet TAKE 1 TABLET (20 MEQ TOTAL) BY MOUTH AS NEEDED (TAKE ON DAYS LASIX IS TAKEN).   promethazine (PHENERGAN) 25 MG tablet Take 25 mg by mouth every 6 (six) hours as needed for nausea.   rosuvastatin (CRESTOR) 20 MG tablet TAKE 1 TABLET BY MOUTH DAILY AT 6 PM.   sertraline (ZOLOFT) 25 MG tablet Take 25 mg by mouth daily.   traMADol (ULTRAM) 50 MG  tablet Take 50-100 mg by mouth every 4 (four) hours as needed for moderate pain (as needed for migraine pain and neuropathy.).    UBRELVY 100 MG TABS TAKE 100 MG BY MOUTH EVERY 2 (TWO) HOURS AS NEEDED (MIGRAINE.). MAXIMUM 200MG  A DAY.   zinc gluconate 50 MG tablet Take 50 mg by mouth daily.     Allergies:   Codeine, Sulfonamide derivatives, Triptans, Amlodipine, Chocolate, Onion, and Other   Social History   Socioeconomic History   Marital status: Married    Spouse name: Not on file   Number of children: 2   Years of education: 13   Highest education level: Not on file  Occupational History   Occupation: retired    Comment: medical records admin  Tobacco Use   Smoking status: Never   Smokeless tobacco: Never  Vaping Use   Vaping Use: Never used  Substance and Sexual Activity   Alcohol use: No   Drug use: No   Sexual activity: Not on file  Other Topics Concern   Not on file  Social History Narrative   Lives at home with husband   Right handed   Caffeine: maybe 1 cup/day   Social Determinants of Health   Financial Resource Strain: Not on file  Food Insecurity: Not on file  Transportation Needs: Not on file  Physical Activity: Not on file  Stress: Not on file  Social Connections: Not on file     Family History: The patient's family history includes Breast cancer in her paternal grandmother; Colon cancer in her cousin; Colon polyps in her father; Coronary artery disease (age of onset: 7) in her father; Esophageal cancer in her cousin; Stroke in her maternal grandfather and paternal grandmother; Stroke (age of onset: 78) in her mother; Suicidality in her daughter. There is no history of Rectal cancer, Stomach cancer, Tremor, Parkinson's disease, Alzheimer's disease, or Dementia.  ROS:   Please see the history of present illness.    All other systems reviewed and are negative.  EKGs/Labs/Other Studies Reviewed:    The following studies were reviewed today: ***  EKG:   The ekg ordered today demonstrates ***   Recent Labs: 02/28/2021: BNP 56.2 12/02/2021: ALT 9; BUN 12; Creatinine, Ser 1.11; Hemoglobin 13.1; Platelets 168; Potassium 4.2; Sodium 147; TSH 2.860  Recent Lipid Panel    Component Value Date/Time   CHOL 221 (H) 05/13/2019 0600   CHOL 179 05/22/2018 1023   CHOL 146 04/14/2014 0952   TRIG 124 05/13/2019 0600   TRIG 94 04/14/2014 0952   HDL 48 05/13/2019 0600   HDL 68 05/22/2018 1023   HDL 59 04/14/2014 0952   CHOLHDL 4.6 05/13/2019 0600   VLDL 25 05/13/2019 0600   LDLCALC 148 (H) 05/13/2019 0600   LDLCALC 88 05/22/2018 1023   LDLCALC 68 04/14/2014 1478  Physical Exam:    VS:  BP (!) 92/56   Pulse 70   Ht 5\' 6"  (1.676 m)   Wt 155 lb 9.6 oz (70.6 kg)   LMP  (LMP Unknown)   SpO2 96%   BMI 25.11 kg/m     Wt Readings from Last 3 Encounters:  01/17/22 155 lb 9.6 oz (70.6 kg)  01/12/22 168 lb (76.2 kg)  12/09/21 167 lb 14.4 oz (76.2 kg)     GEN: *** Well nourished, well developed in no acute distress HEENT: Normal NECK: No JVD; No carotid bruits LYMPHATICS: No lymphadenopathy CARDIAC: ***RRR, no murmurs, rubs, gallops RESPIRATORY:  Clear to auscultation without rales, wheezing or rhonchi  ABDOMEN: Soft, non-tender, non-distended MUSCULOSKELETAL:  No edema; No deformity  SKIN: Warm and dry NEUROLOGIC:  Alert and oriented x 3 PSYCHIATRIC:  Normal affect       ASSESSMENT:    No diagnosis found. PLAN:    In order of problems listed above:         Total time spent with patient today *** minutes. This includes reviewing records, evaluating the patient and coordinating care.  Medication Adjustments/Labs and Tests Ordered: Current medicines are reviewed at length with the patient today.  Concerns regarding medicines are outlined above.  No orders of the defined types were placed in this encounter.  No orders of the defined types were placed in this encounter.    Signed, Rossie Muskrat. Lalla Brothers, MD, [facility],  [facility] 01/17/2022 4:00 PM    Electrophysiology [facility]

## 2022-01-18 NOTE — Progress Notes (Unsigned)
01/19/22: She is here today with her husband and daughter.  Daughter reports that she complains of headache more than once a week.  They are not sure how much the Botox is actually beneficial.  Currently not keeping a headache journal.  Has never tried [place].  10/27/21: Having a about 1 headache a week. Botox is working well.  08/02/21: having at least one a week. Feels that they are better. Severity has lessened.   05/10/21: So far the patient has not noticed any improvement in her migraines since starting Botox.  After this injection cycle if she does not notice any improvement we will consider other treatment options.  She will let us know prior to her next Botox appointment   BOTOX PROCEDURE NOTE FOR MIGRAINE HEADACHE    Contraindications and precautions discussed with patient(above). Aseptic procedure was observed and patient tolerated procedure. Procedure performed by Ward Givens, NP  The condition has existed for more than 6 months, and pt does not have a diagnosis of ALS, Myasthenia Gravis or Lambert-Eaton Syndrome.  Risks and benefits of injections discussed and pt agrees to proceed with the procedure.  Written consent obtained  These injections are medically necessary. These injections do not cause sedations or hallucinations which the oral therapies may cause.  Indication/Diagnosis: chronic migraine BOTOX(J0585) injection was performed according to protocol by [employer]. 200 units of BOTOX was dissolved into 4 cc NS.   NDC: [address]-90  Botox- 200 units x 1 vial Lot: [id number] Expiration: 04/2024 NDC: 3299-2426-83   Bacteriostatic 0.9% Sodium Chloride- 23m total Lot: [id number]: 11/19/2022 NDC: [address]-19  Dx: [id number]  Description of procedure:  The patient was placed in a sitting position. The standard protocol was used for Botox as follows, with 5 units of Botox injected at each site:   -Procerus muscle, midline injection  -Corrugator muscle,  bilateral injection  -Frontalis muscle, bilateral injection, with 2 sites each side, medial injection was performed in the upper one third of the frontalis muscle, in the region vertical from the medial inferior edge of the superior orbital rim. The lateral injection was again in the upper one third of the forehead vertically above the lateral limbus of the cornea, 1.5 cm lateral to the medial injection site.  -Temporalis muscle injection, 4 sites, bilaterally. The first injection was 3 cm above the tragus of the ear, second injection site was 1.5 cm to 3 cm up from the first injection site in line with the tragus of the ear. The third injection site was 1.5-3 cm forward between the first 2 injection sites. The fourth injection site was 1.5 cm posterior to the second injection site.  -Occipitalis muscle injection, 3 sites, bilaterally. The first injection was done one half way between the occipital protuberance and the tip of the mastoid process behind the ear. The second injection site was done lateral and superior to the first, 1 fingerbreadth from the first injection. The third injection site was 1 fingerbreadth superiorly and medially from the first injection site.  -Cervical paraspinal muscle injection, 2 sites, bilateral knee first injection site was 1 cm from the midline of the cervical spine, 3 cm inferior to the lower border of the occipital protuberance. The second injection site was 1.5 cm superiorly and laterally to the first injection site.  -Trapezius muscle injection was performed at 3 sites, bilaterally. The first injection site was in the upper trapezius muscle halfway between the inflection point of the neck, and the acromion. The  second injection site was one half way between the acromion and the first injection site. The third injection was done between the first injection site and the inflection point of the neck.   Will return for repeat injection in 3 months.   A 200 units of  Botox was used, 155 units were injected, the rest of the Botox was wasted. The patient tolerated the procedure well, there were no complications of the above procedure.  Ward Givens, MSN, NP-C 01/18/2022, 5:07 PM [facility] [address], [place], [place] [address] [phone]

## 2022-01-19 ENCOUNTER — Telehealth: Payer: Self-pay | Admitting: Adult Health

## 2022-01-19 ENCOUNTER — Ambulatory Visit: Payer: Medicare HMO | Admitting: Adult Health

## 2022-01-19 DIAGNOSIS — G43009 Migraine without aura, not intractable, without status migrainosus: Secondary | ICD-10-CM

## 2022-01-19 MED ORDER — ONABOTULINUMTOXINA 200 UNITS IJ SOLR
155.0000 [IU] | Freq: Once | INTRAMUSCULAR | Status: AC
  Administered 2022-01-19: 155 [IU] via INTRAMUSCULAR

## 2022-01-19 MED ORDER — QULIPTA 60 MG PO TABS: 60.0000 mg | ORAL_TABLET | Freq: Every day | ORAL | 5 refills | Status: DC

## 2022-01-19 NOTE — Progress Notes (Signed)
Botox- 200 units x 1 vial Lot: [id number] Expiration: 04/2024 NDC: 6967-8938-10  Bacteriostatic 0.9% Sodium Chloride- 57m total Lot: [id number]: 11/19/2022 NDC: [address]-42 Dx: [id number]/B

## 2022-01-19 NOTE — Telephone Encounter (Signed)
yes

## 2022-01-19 NOTE — Telephone Encounter (Signed)
Pt stated at check out that she would like to stop botox and would like to try [place]

## 2022-01-19 NOTE — Addendum Note (Signed)
Addended by: Gildardo Griffes on: 01/19/2022 04:06 PM   Modules accepted: Orders

## 2022-01-24 ENCOUNTER — Telehealth: Payer: Self-pay | Admitting: *Deleted

## 2022-01-24 NOTE — Telephone Encounter (Addendum)
Denied today You asked for the drug listed above for 30 tablets per 30 days, which has been approved for/through 03/20/2023 by the [place]. However, the requested drug has also rejected due to a high quantity. The information we have says the pharmacy is trying to fill 30 tablets per 15 days. Please check that the pharmacy is filling the dosage written by your provider. Called [facility] spoke with Caryl Pina and advised med ia approved thru 03/20/23, please run Rx thru for 30 tabs for 30 days. She ran Rx stated cost is $288 month. She will notify patient.

## 2022-01-24 NOTE — Telephone Encounter (Signed)
Lenoria Chime PA, Key: [id number]. [id number] Your information has been sent to [facility].

## 2022-02-23 ENCOUNTER — Other Ambulatory Visit: Payer: Self-pay | Admitting: Physician Assistant

## 2022-03-20 ENCOUNTER — Other Ambulatory Visit: Payer: Self-pay | Admitting: Physician Assistant

## 2022-03-24 DIAGNOSIS — M62838 Other muscle spasm: Secondary | ICD-10-CM | POA: Diagnosis not present

## 2022-03-24 DIAGNOSIS — R69 Illness, unspecified: Secondary | ICD-10-CM | POA: Diagnosis not present

## 2022-03-24 DIAGNOSIS — M1990 Unspecified osteoarthritis, unspecified site: Secondary | ICD-10-CM | POA: Diagnosis not present

## 2022-03-24 DIAGNOSIS — G629 Polyneuropathy, unspecified: Secondary | ICD-10-CM | POA: Diagnosis not present

## 2022-03-24 DIAGNOSIS — E785 Hyperlipidemia, unspecified: Secondary | ICD-10-CM | POA: Diagnosis not present

## 2022-03-24 DIAGNOSIS — I2510 Atherosclerotic heart disease of native coronary artery without angina pectoris: Secondary | ICD-10-CM | POA: Diagnosis not present

## 2022-03-24 DIAGNOSIS — I509 Heart failure, unspecified: Secondary | ICD-10-CM | POA: Diagnosis not present

## 2022-03-24 DIAGNOSIS — E039 Hypothyroidism, unspecified: Secondary | ICD-10-CM | POA: Diagnosis not present

## 2022-03-24 DIAGNOSIS — D6869 Other thrombophilia: Secondary | ICD-10-CM | POA: Diagnosis not present

## 2022-03-24 DIAGNOSIS — I4891 Unspecified atrial fibrillation: Secondary | ICD-10-CM | POA: Diagnosis not present

## 2022-03-24 DIAGNOSIS — I110 Hypertensive heart disease with heart failure: Secondary | ICD-10-CM | POA: Diagnosis not present

## 2022-03-24 DIAGNOSIS — E876 Hypokalemia: Secondary | ICD-10-CM | POA: Diagnosis not present

## 2022-04-18 ENCOUNTER — Ambulatory Visit: Payer: Medicare HMO | Admitting: Adult Health

## 2022-04-27 ENCOUNTER — Encounter (HOSPITAL_COMMUNITY): Payer: Self-pay | Admitting: *Deleted

## 2022-05-15 DIAGNOSIS — R519 Headache, unspecified: Secondary | ICD-10-CM | POA: Diagnosis not present

## 2022-05-15 DIAGNOSIS — G8929 Other chronic pain: Secondary | ICD-10-CM | POA: Diagnosis not present

## 2022-05-15 DIAGNOSIS — G629 Polyneuropathy, unspecified: Secondary | ICD-10-CM | POA: Diagnosis not present

## 2022-05-20 ENCOUNTER — Other Ambulatory Visit: Payer: Self-pay | Admitting: Internal Medicine

## 2022-05-22 NOTE — Telephone Encounter (Signed)
Prescription refill request for Eliquis received. Indication: AF Last office visit: 01/17/22  Grayce Sessions MD Scr: 1.11 on 12/02/21  Epic Age: 82 Weight: 70.6kg  Based on above findings Eliquis '5mg'$  twice daily is the appropriate dose.  Refill approved.

## 2022-06-05 ENCOUNTER — Other Ambulatory Visit: Payer: Self-pay | Admitting: Adult Health

## 2022-07-02 ENCOUNTER — Other Ambulatory Visit: Payer: Self-pay | Admitting: Cardiology

## 2022-07-26 ENCOUNTER — Other Ambulatory Visit: Payer: Self-pay | Admitting: Cardiology

## 2022-07-31 IMAGING — CT CT HEART MORPH/PULM VEIN W/ CM & W/O CA SCORE
2 of 7 series · 11 of 20 positions shown, 13 images · IV contrast (Omni 300)
Comparison: None.
COMPARISON: None.

Addendum:
EXAM:
OVER-READ INTERPRETATION  CT CHEST

The following report is an over-read performed by radiologist Dr.
Queli Tecco [REDACTED] on 12/09/2019. This
over-read does not include interpretation of cardiac or coronary
anatomy or pathology. The coronary calcium score/coronary CTA
interpretation by the cardiologist is attached.
CLINICAL DATA: Atrial fibrillation scheduled for an ablation.
Cardiac CT/CTA
TECHNIQUE: The patient was scanned on a Siemens Somatom scanner.

[Series 9: 0-95% · axial · 0.36mm/px · z∈[+1107,+1214]mm · 5 of 3200 slices shown]
[im 534/3200  vessel]
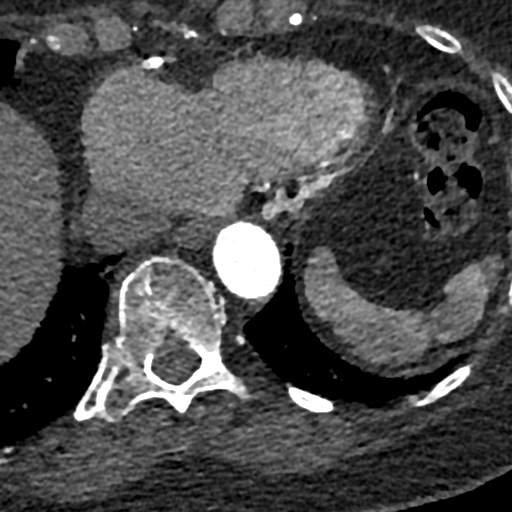
[im 1067/3200  vessel]
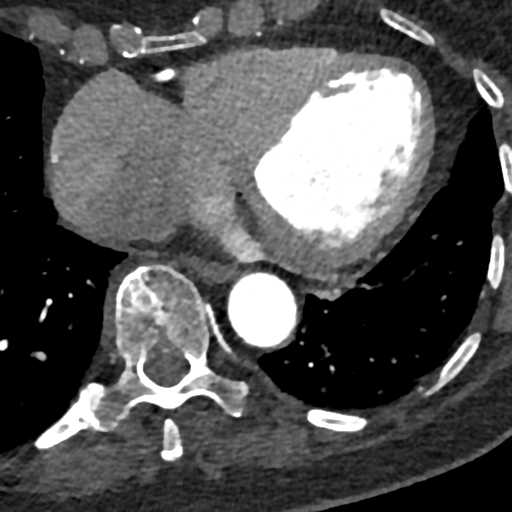
[im 1600/3200  vessel]
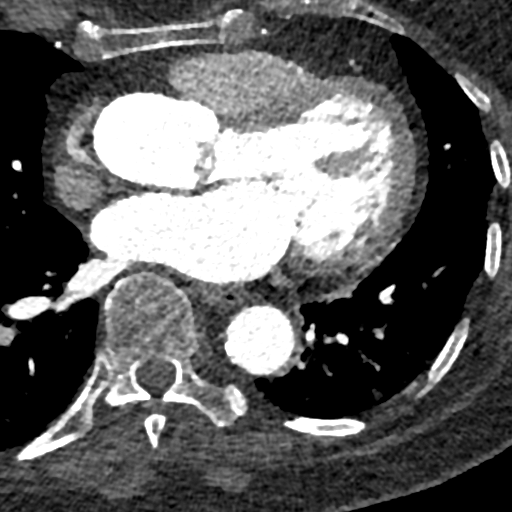
[im 2133/3200  vessel]
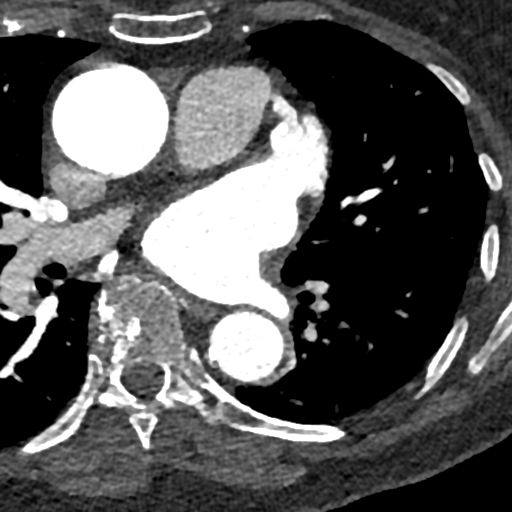
[im 2666/3200  vessel]
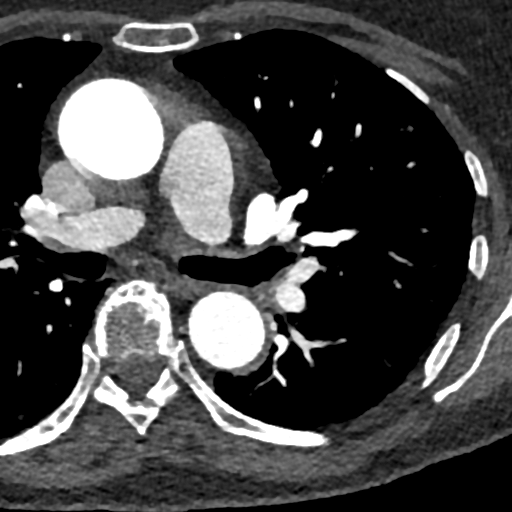

[Series 14: 5-95% · axial · 0.65mm/px · z∈[+1103,+1218]mm · 6 of 3200 slices shown, 8 images]
[im 458/3200  vessel]
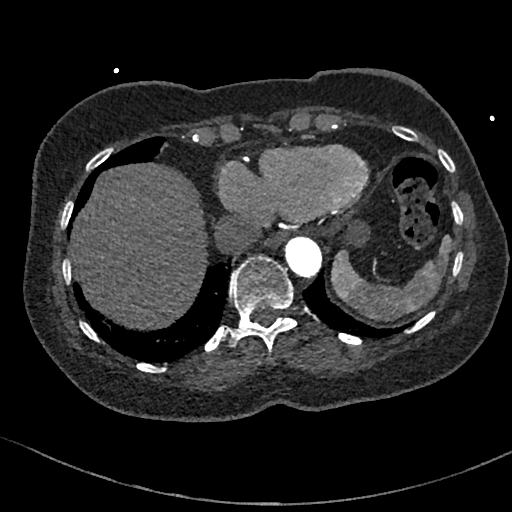
[im 458/3200  lung]
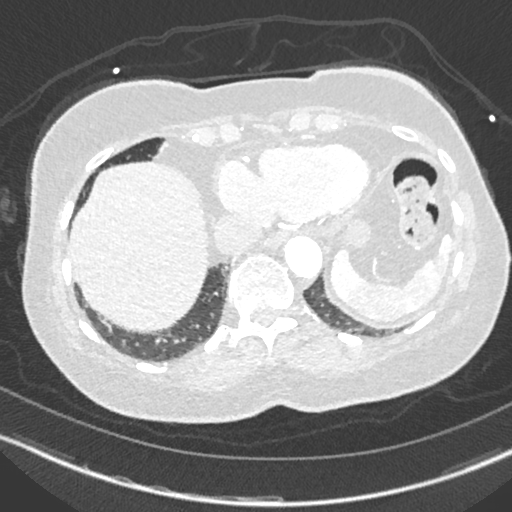
[im 915/3200  vessel]
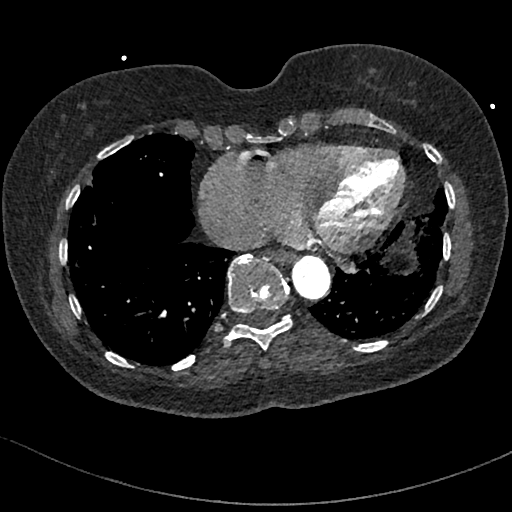
[im 1372/3200  vessel]
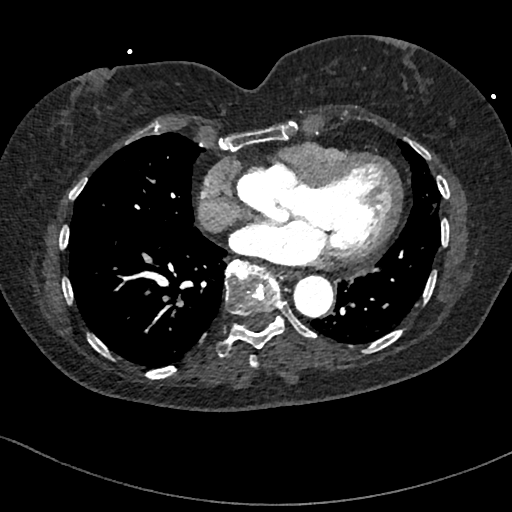
[im 1829/3200  vessel]
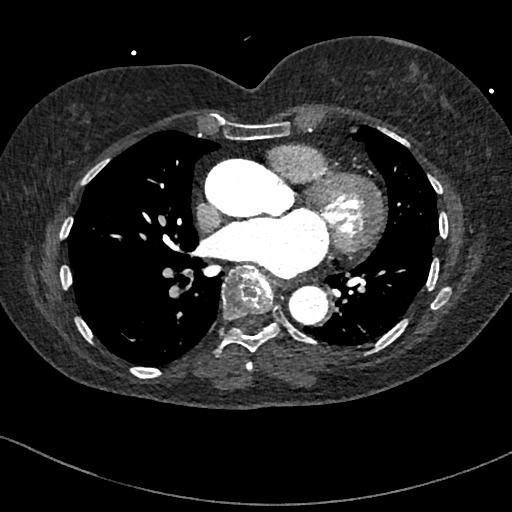
[im 2286/3200  vessel]
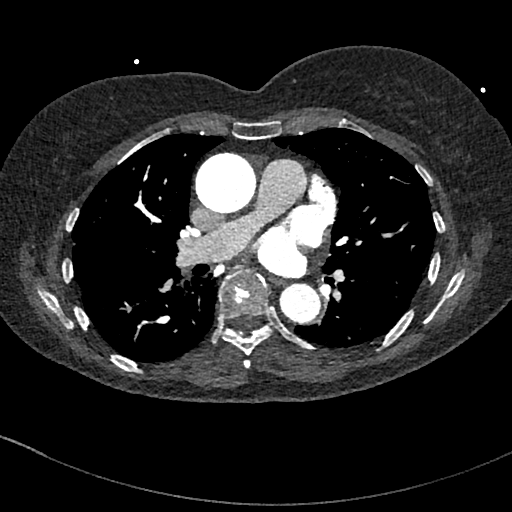
[im 2286/3200  lung]
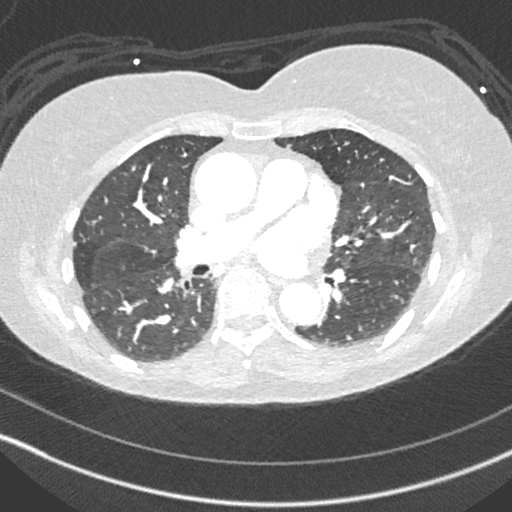
[im 2743/3200  vessel]
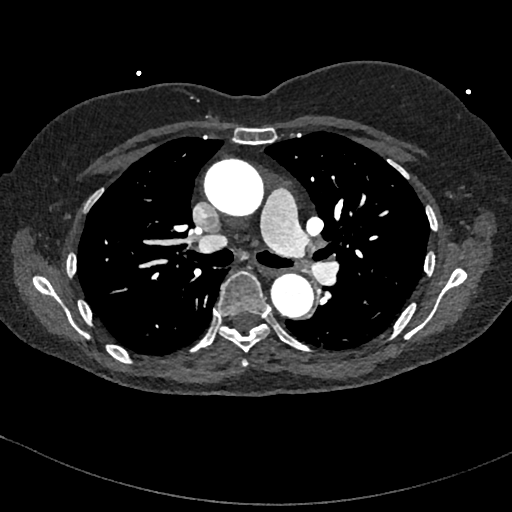

[11 of 20 positions shown; findings below may reference images not displayed]

FINDINGS: Aortic atherosclerosis. Within the visualized portions of the thorax
there are no suspicious appearing pulmonary nodules or masses, there
is no acute consolidative airspace disease, no pleural effusions, no
pneumothorax and no lymphadenopathy. Visualized portions of the
upper abdomen are unremarkable. There are no aggressive appearing
lytic or blastic lesions noted in the visualized portions of the
skeleton.
IMPRESSION: 1.  Aortic Atherosclerosis (Y5A35-QTN.N).
FINDINGS: A 120 kV prospective scan was triggered in the descending thoracic
aorta at 111 HU's. Gantry rotation speed was 280 msecs and
collimation was .9 mm. No beta blockade and no NTG was given. The 3D
data set was reconstructed in 5% intervals of the 0-95 % of the R-R
cycle. Diastolic phases were analyzed on a dedicated work station
using MPR, MIP and VRT modes. The patient received 80 cc of
contrast.

There is normal pulmonary vein drainage into the left atrium (3 on
the right and 2 on the left) with ostial measurements as follows:

RUPV: 21.3 x 17.5 mm, area 2.90 cm2

RMLPV: 6.12 x 5.38 mm, area 0.25 cm2

RLPV: 17 x 7.46 mm, area 0.94 cm2 at ostium, narrowest point.

LUPV: 22.1 x 13.6 mm, area 2.41 cm2

LLPV: 20.6 x 11.7 mm, area 1.89 cm2

No anomalous pulmonary venous drainage. No pulmonary vein stenosis.

Moderate biatral enlargement.

Large left atrial appendage, no left atrial appendage thrombus. No
intracardiac mass or thrombus.

The esophagus runs adjacent to left pulmonary veins.

Aorta: 39 mm, borderline dilation at mid ascending aorta measured
double oblique. No dissection or calcifications.

Aortic Valve:  Trileaflet.  No calcifications.  Mildly thickened.

Coronary Arteries: Normal coronary origin. Right dominance. The
study was performed without use of NTG and insufficient for plaque
evaluation. Coronary artery calcium score is 29, which is 35th
percentile for age and sex matched peers.

Pulmonary artery: Normal caliber main pulmonary artery.
IMPRESSION: 1. There is normal pulmonary vein drainage into the left atrium. No
pulmonary vein stenosis.

2. Large left atrial appendage, no left atrial appendage thrombus.
No intracardiac mass or thrombus.

3. The esophagus runs adjacent to left pulmonary veins.

4. Borderline dilation of ascending aorta, 39 mm at mid ascending
aorta.

*** End of Addendum ***
EXAM:
OVER-READ INTERPRETATION  CT CHEST

The following report is an over-read performed by radiologist Dr.
Queli Tecco [REDACTED] on 12/09/2019. This
over-read does not include interpretation of cardiac or coronary
anatomy or pathology. The coronary calcium score/coronary CTA
interpretation by the cardiologist is attached.
FINDINGS: Aortic atherosclerosis. Within the visualized portions of the thorax
there are no suspicious appearing pulmonary nodules or masses, there
is no acute consolidative airspace disease, no pleural effusions, no
pneumothorax and no lymphadenopathy. Visualized portions of the
upper abdomen are unremarkable. There are no aggressive appearing
lytic or blastic lesions noted in the visualized portions of the
skeleton.
IMPRESSION: 1.  Aortic Atherosclerosis (Y5A35-QTN.N).

## 2022-08-31 ENCOUNTER — Telehealth: Payer: Self-pay

## 2022-08-31 NOTE — Patient Outreach (Signed)
Care Coordination   Initial Visit Note   08/31/2022 Name: Bridget [name] MRN: [id number] DOB: June 25, 1940  Bridget [name] is a 82 y.o. year old female who sees Marylen Ponto, MD for primary care. I spoke with  Hoy Morn by phone today.  What matters to the patients health and wellness today?  Placed call to patient to review and offer [facility] care coordination program. Patient reports that she is doing well and denies any needs at this time. Reviewed her MHA and that nothing seems to help. Encouraged patient to stay hydrated.    SDOH assessments and interventions completed:       Care Coordination Interventions:  No, not indicated   Follow up plan: No further intervention required.   Encounter Outcome:  Pt. Refused   Rowe Pavy, RN, BSN, CEN [facility] [phone]

## 2022-09-25 DIAGNOSIS — R519 Headache, unspecified: Secondary | ICD-10-CM | POA: Diagnosis not present

## 2022-09-25 DIAGNOSIS — R413 Other amnesia: Secondary | ICD-10-CM | POA: Diagnosis not present

## 2022-09-25 DIAGNOSIS — G8929 Other chronic pain: Secondary | ICD-10-CM | POA: Diagnosis not present

## 2022-09-25 DIAGNOSIS — G629 Polyneuropathy, unspecified: Secondary | ICD-10-CM | POA: Diagnosis not present

## 2022-10-25 ENCOUNTER — Other Ambulatory Visit: Payer: Self-pay | Admitting: Internal Medicine

## 2022-11-08 ENCOUNTER — Other Ambulatory Visit: Payer: Self-pay | Admitting: Internal Medicine

## 2022-11-08 ENCOUNTER — Other Ambulatory Visit: Payer: Self-pay

## 2022-11-08 MED ORDER — ROSUVASTATIN CALCIUM 20 MG PO TABS: 20.0000 mg | ORAL_TABLET | Freq: Every day | ORAL | 0 refills | Status: DC

## 2022-11-08 NOTE — Telephone Encounter (Signed)
Prescription refill request for Eliquis received. Indication: A Flutter Last office visit: 01/17/22  Jeanie Cooks MD Scr: 1.11 on 12/02/21  Epic Age: 82 Weight: 70.6kg  Based on above findings Eliquis 5mg  twice daily is the appropriate dose.  Refill approved. Requested labs be done at 11/24/22 appt.

## 2022-11-23 DIAGNOSIS — I10 Essential (primary) hypertension: Secondary | ICD-10-CM | POA: Diagnosis not present

## 2022-11-23 DIAGNOSIS — E782 Mixed hyperlipidemia: Secondary | ICD-10-CM | POA: Diagnosis not present

## 2022-11-23 DIAGNOSIS — Z1339 Encounter for screening examination for other mental health and behavioral disorders: Secondary | ICD-10-CM | POA: Diagnosis not present

## 2022-11-23 DIAGNOSIS — Z0000 Encounter for general adult medical examination without abnormal findings: Secondary | ICD-10-CM | POA: Diagnosis not present

## 2022-11-23 DIAGNOSIS — Z1331 Encounter for screening for depression: Secondary | ICD-10-CM | POA: Diagnosis not present

## 2022-11-23 DIAGNOSIS — F32A Depression, unspecified: Secondary | ICD-10-CM | POA: Diagnosis not present

## 2022-11-23 DIAGNOSIS — Z6823 Body mass index (BMI) 23.0-23.9, adult: Secondary | ICD-10-CM | POA: Diagnosis not present

## 2022-11-23 DIAGNOSIS — Z79899 Other long term (current) drug therapy: Secondary | ICD-10-CM | POA: Diagnosis not present

## 2022-11-23 DIAGNOSIS — E039 Hypothyroidism, unspecified: Secondary | ICD-10-CM | POA: Diagnosis not present

## 2022-11-24 ENCOUNTER — Encounter: Payer: Self-pay | Admitting: Student

## 2022-11-24 ENCOUNTER — Ambulatory Visit: Payer: Medicare HMO | Attending: Student | Admitting: Student

## 2022-11-24 ENCOUNTER — Other Ambulatory Visit: Payer: Self-pay

## 2022-11-24 VITALS — BP 110/66 | HR 68 | Ht 67.0 in | Wt 154.2 lb

## 2022-11-24 DIAGNOSIS — I498 Other specified cardiac arrhythmias: Secondary | ICD-10-CM | POA: Diagnosis not present

## 2022-11-24 DIAGNOSIS — I428 Other cardiomyopathies: Secondary | ICD-10-CM | POA: Diagnosis not present

## 2022-11-24 DIAGNOSIS — I4819 Other persistent atrial fibrillation: Secondary | ICD-10-CM | POA: Diagnosis not present

## 2022-11-24 DIAGNOSIS — I10 Essential (primary) hypertension: Secondary | ICD-10-CM | POA: Diagnosis not present

## 2022-11-24 NOTE — Patient Instructions (Signed)

## 2022-11-24 NOTE — Progress Notes (Signed)
Electrophysiology Office Note:   Date:  11/24/2022  ID:  LEKHA TUPA, DOB January 06, 1941, MRN [id number]  Primary Cardiologist: Chrystie Nose, MD Electrophysiologist: Lanier Prude, MD      History of Present Illness:   MERSAYDES OBST is a 82 y.o. female with h/o persistent AF, HTN, HLD, and hypothyroidism seen today for routine electrophysiology followup.   Since last being seen in our clinic the patient reports doing well from a cardiac perspective.  she denies chest pain, palpitations, dyspnea, PND, orthopnea, nausea, vomiting, dizziness, syncope, edema, weight gain, or early satiety.   Review of systems complete and found to be negative unless listed in HPI.   EP Information / Studies Reviewed:    EKG is ordered today. Personal review as below.  EKG Interpretation Date/Time:  Friday November 24 2022 12:08:32 EDT Ventricular Rate:  68 PR Interval:  192 QRS Duration:  86 QT Interval:  366 QTC Calculation: 389 R Axis:   -40  Text Interpretation: Normal sinus rhythm with sinus arrhythmia Left axis deviation Low voltage QRS Confirmed by Maxine Glenn [phone] on 11/24/2022 12:54:39 PM    AAD history S/p ablation 11/2019 by Dr. Shanon Rosser of Amiodarone due to "memory problems"  Physical Exam:   VS:  BP 110/66   Pulse 68   Ht 5\' 7"  (1.702 m)   Wt 154 lb 3.2 oz (69.9 kg)   LMP  (LMP Unknown)   SpO2 95%   BMI 24.15 kg/m    Wt Readings from Last 3 Encounters:  11/24/22 154 lb 3.2 oz (69.9 kg)  01/17/22 155 lb 9.6 oz (70.6 kg)  01/12/22 168 lb (76.2 kg)     GEN: Well nourished, well developed in no acute distress NECK: No JVD; No carotid bruits CARDIAC: Regular rate and rhythm, no murmurs, rubs, gallops RESPIRATORY:  Clear to auscultation without rales, wheezing or rhonchi  ABDOMEN: Soft, non-tender, non-distended EXTREMITIES:  No edema; No deformity   ASSESSMENT AND PLAN:    Persistent atrial fibrillation S/p ablation 11/2019 by Dr. Johney Frame EKG today  shows NSR at 68 bpm Continue eliquis for CHA2DS2/VASc of at least 6.  Labs yesterday from PCP.  Age 63, Weight 69.9 kg - BMET pending PCP labs (drawn 9/5). She has been borderline ~ 1.5 in the past.   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 Echo 02/2021 with LVEF 55-60%  HTN Stable on current regimen    Follow up with Dr. Lalla Brothers in 12 months  Signed, Graciella Freer, PA-C

## 2022-12-05 NOTE — Progress Notes (Deleted)
Cardiology Office Note   Date:  12/05/2022  ID:  Bridget [name], DOB 24-Aug-1940, MRN [id number] PCP:  Marylen Ponto, MD [facility] Cardiologist: Chrystie Nose, MD  Reason for visit: 12-month follow-up  History of Present Illness    Bridget [name] is a 82 y.o. female with a hx of A-fib status post ablation 2021 with Dr. Johney Frame, tachy mediated cardiomyopathy, chronic systolic and diastolic heart failure, hypertension.  EF 30 to 35% in 2021.  EF normalized on follow-up echo with rate control.  Pt had a-fib with RVR in September 2023.  After starting amiodarone and metoprolol, she converted spontaneously prior to planned cardioversion.  Patient reported feeling disoriented with amiodarone; therefore amiodarone was discontinued.  Patient saw Bettina Gavia October 2023.  Patient was referred back to EP as she cannot tolerate amiodarone.  Patient followed up with EP November 24, 2022 was doing well in normal sinus rhythm.  Dr. Steffanie Dunn mentioned considering dofetilide if patient had symptomatic recurrence of AF.  - Repeat echo   Today, ***  Tachycardia mediated cardiomyopathy History of systolic and diastolic heart failure - EF 30 to 35% in 2021.  EF normalized on follow-up echo with rate control. -***  Aortic regurgitation -Moderate aortic regurgitation on echo December 2022 -***  Persistent AF -Status post ablation with Dr. Johney Frame in 2021 -Did not tolerate amiodarone secondary to disorientation -Followed by EP -Dr. Steffanie Dunn.  Mentioned dofetilide if patient had recurrent symptomatic A-fib. -***  Hypertension -*** -Goal BP is <130/80.  Recommend DASH diet (high in vegetables, fruits, low-fat Bridget products, whole grains, poultry, fish, and nuts and low in sweets, sugar-sweetened beverages, and red meats), salt restriction and increase physical activity.  Hyperlipidemia -*** -Recommend cholesterol lowering diets - Mediterranean diet, DASH diet, vegetarian  diet, low-carbohydrate diet and avoidance of trans fats.  Discussed healthier choice substitutes.  Nuts, high-fiber foods, and fiber supplements may also improve lipids.    Obesity -Even a 5-10% weight loss can have cardiovascular benefits.   -Recommend moderate intensity activity for 30 minutes 5 days/week and the DASH diet.  Tobacco use  -Recommend tobacco cessation.  Reviewed physiologic effects of nicotine and the immediate-eventual benefits of quitting including improvement in cough/breathing and reduction in cardiovascular events.  Discussed quitting tips such as removing triggers and getting support from family/friends and Quitline [place]. -USPSTF recommends one-time screening for abdominal aortic aneurysm (AAA) by ultrasound in men 65 -57 years old who have ever smoked.      Disposition - Follow-up in ***     Objective / Physical Exam   EKG today: ***  Vital signs:  LMP  (LMP Unknown)     GEN: No acute distress NECK: No carotid bruits CARDIAC: ***RRR, no murmurs RESPIRATORY:  Clear to auscultation without rales, wheezing or rhonchi  EXTREMITIES: No edema  Assessment and Plan   ***   {Are you ordering a CV Procedure (e.g. stress test, cath, DCCV, TEE, etc)?   Press F2        :[id number]}    Signed, Bernette Mayers  12/05/2022 [facility]

## 2022-12-06 ENCOUNTER — Ambulatory Visit: Payer: Medicare HMO | Attending: Physician Assistant | Admitting: Physician Assistant

## 2022-12-06 DIAGNOSIS — I351 Nonrheumatic aortic (valve) insufficiency: Secondary | ICD-10-CM

## 2022-12-06 DIAGNOSIS — I10 Essential (primary) hypertension: Secondary | ICD-10-CM

## 2022-12-06 DIAGNOSIS — I4819 Other persistent atrial fibrillation: Secondary | ICD-10-CM

## 2022-12-06 DIAGNOSIS — I428 Other cardiomyopathies: Secondary | ICD-10-CM

## 2022-12-10 ENCOUNTER — Other Ambulatory Visit: Payer: Self-pay | Admitting: Adult Health

## 2022-12-16 ENCOUNTER — Emergency Department (HOSPITAL_COMMUNITY): Payer: Medicare HMO

## 2022-12-16 ENCOUNTER — Inpatient Hospital Stay (HOSPITAL_COMMUNITY): Payer: Medicare HMO

## 2022-12-16 ENCOUNTER — Inpatient Hospital Stay (HOSPITAL_COMMUNITY)
Admission: EM | Admit: 2022-12-16 | Discharge: 2022-12-21 | DRG: 871 | Disposition: A | Discharge status: Home Health | Payer: Medicare HMO | Attending: Internal Medicine | Admitting: Internal Medicine

## 2022-12-16 ENCOUNTER — Other Ambulatory Visit: Payer: Self-pay

## 2022-12-16 DIAGNOSIS — Z96642 Presence of left artificial hip joint: Secondary | ICD-10-CM | POA: Diagnosis not present

## 2022-12-16 DIAGNOSIS — Z8249 Family history of ischemic heart disease and other diseases of the circulatory system: Secondary | ICD-10-CM

## 2022-12-16 DIAGNOSIS — R112 Nausea with vomiting, unspecified: Secondary | ICD-10-CM | POA: Diagnosis not present

## 2022-12-16 DIAGNOSIS — E039 Hypothyroidism, unspecified: Secondary | ICD-10-CM | POA: Diagnosis present

## 2022-12-16 DIAGNOSIS — M549 Dorsalgia, unspecified: Secondary | ICD-10-CM | POA: Diagnosis present

## 2022-12-16 DIAGNOSIS — I129 Hypertensive chronic kidney disease with stage 1 through stage 4 chronic kidney disease, or unspecified chronic kidney disease: Secondary | ICD-10-CM | POA: Diagnosis present

## 2022-12-16 DIAGNOSIS — Z803 Family history of malignant neoplasm of breast: Secondary | ICD-10-CM

## 2022-12-16 DIAGNOSIS — Z7901 Long term (current) use of anticoagulants: Secondary | ICD-10-CM

## 2022-12-16 DIAGNOSIS — Z79899 Other long term (current) drug therapy: Secondary | ICD-10-CM

## 2022-12-16 DIAGNOSIS — B961 Klebsiella pneumoniae [K. pneumoniae] as the cause of diseases classified elsewhere: Secondary | ICD-10-CM | POA: Diagnosis not present

## 2022-12-16 DIAGNOSIS — G43909 Migraine, unspecified, not intractable, without status migrainosus: Secondary | ICD-10-CM | POA: Diagnosis present

## 2022-12-16 DIAGNOSIS — N1832 Chronic kidney disease, stage 3b: Secondary | ICD-10-CM | POA: Diagnosis present

## 2022-12-16 DIAGNOSIS — N3000 Acute cystitis without hematuria: Secondary | ICD-10-CM | POA: Diagnosis not present

## 2022-12-16 DIAGNOSIS — G9341 Metabolic encephalopathy: Secondary | ICD-10-CM | POA: Diagnosis not present

## 2022-12-16 DIAGNOSIS — R6520 Severe sepsis without septic shock: Secondary | ICD-10-CM | POA: Diagnosis present

## 2022-12-16 DIAGNOSIS — Z9049 Acquired absence of other specified parts of digestive tract: Secondary | ICD-10-CM

## 2022-12-16 DIAGNOSIS — Z85828 Personal history of other malignant neoplasm of skin: Secondary | ICD-10-CM | POA: Diagnosis not present

## 2022-12-16 DIAGNOSIS — R4182 Altered mental status, unspecified: Secondary | ICD-10-CM | POA: Diagnosis not present

## 2022-12-16 DIAGNOSIS — Z1152 Encounter for screening for COVID-19: Secondary | ICD-10-CM | POA: Diagnosis not present

## 2022-12-16 DIAGNOSIS — R404 Transient alteration of awareness: Secondary | ICD-10-CM | POA: Diagnosis not present

## 2022-12-16 DIAGNOSIS — A419 Sepsis, unspecified organism: Principal | ICD-10-CM | POA: Diagnosis present

## 2022-12-16 DIAGNOSIS — G629 Polyneuropathy, unspecified: Secondary | ICD-10-CM | POA: Diagnosis present

## 2022-12-16 DIAGNOSIS — I4819 Other persistent atrial fibrillation: Secondary | ICD-10-CM | POA: Diagnosis not present

## 2022-12-16 DIAGNOSIS — R29818 Other symptoms and signs involving the nervous system: Secondary | ICD-10-CM | POA: Diagnosis not present

## 2022-12-16 DIAGNOSIS — Z90710 Acquired absence of both cervix and uterus: Secondary | ICD-10-CM

## 2022-12-16 DIAGNOSIS — F32A Depression, unspecified: Secondary | ICD-10-CM | POA: Diagnosis present

## 2022-12-16 DIAGNOSIS — E785 Hyperlipidemia, unspecified: Secondary | ICD-10-CM | POA: Diagnosis not present

## 2022-12-16 DIAGNOSIS — R413 Other amnesia: Secondary | ICD-10-CM | POA: Diagnosis present

## 2022-12-16 DIAGNOSIS — I471 Supraventricular tachycardia, unspecified: Secondary | ICD-10-CM | POA: Diagnosis not present

## 2022-12-16 DIAGNOSIS — Z79890 Hormone replacement therapy (postmenopausal): Secondary | ICD-10-CM | POA: Diagnosis not present

## 2022-12-16 DIAGNOSIS — Z96651 Presence of right artificial knee joint: Secondary | ICD-10-CM | POA: Diagnosis present

## 2022-12-16 DIAGNOSIS — K219 Gastro-esophageal reflux disease without esophagitis: Secondary | ICD-10-CM | POA: Diagnosis present

## 2022-12-16 DIAGNOSIS — G9340 Encephalopathy, unspecified: Secondary | ICD-10-CM | POA: Diagnosis not present

## 2022-12-16 DIAGNOSIS — I428 Other cardiomyopathies: Secondary | ICD-10-CM | POA: Diagnosis not present

## 2022-12-16 DIAGNOSIS — Z823 Family history of stroke: Secondary | ICD-10-CM

## 2022-12-16 DIAGNOSIS — G8929 Other chronic pain: Secondary | ICD-10-CM | POA: Diagnosis not present

## 2022-12-16 DIAGNOSIS — I6782 Cerebral ischemia: Secondary | ICD-10-CM | POA: Diagnosis not present

## 2022-12-16 DIAGNOSIS — I4891 Unspecified atrial fibrillation: Secondary | ICD-10-CM | POA: Diagnosis not present

## 2022-12-16 DIAGNOSIS — Z743 Need for continuous supervision: Secondary | ICD-10-CM | POA: Diagnosis not present

## 2022-12-16 DIAGNOSIS — Z83719 Family history of colon polyps, unspecified: Secondary | ICD-10-CM

## 2022-12-16 DIAGNOSIS — Z800 Family history of malignant neoplasm of digestive organs: Secondary | ICD-10-CM

## 2022-12-16 DIAGNOSIS — R9089 Other abnormal findings on diagnostic imaging of central nervous system: Secondary | ICD-10-CM | POA: Diagnosis not present

## 2022-12-16 DIAGNOSIS — R5383 Other fatigue: Secondary | ICD-10-CM | POA: Diagnosis present

## 2022-12-16 DIAGNOSIS — R0902 Hypoxemia: Secondary | ICD-10-CM | POA: Diagnosis not present

## 2022-12-16 HISTORY — DX: Encephalopathy, unspecified: G93.40

## 2022-12-16 LAB — URINALYSIS, ROUTINE W REFLEX MICROSCOPIC
Bilirubin Urine: NEGATIVE
Glucose, UA: NEGATIVE mg/dL
Ketones, ur: NEGATIVE mg/dL
Nitrite: POSITIVE — AB
Protein, ur: 100 mg/dL — AB
Specific Gravity, Urine: 1.009 (ref 1.005–1.030)
pH: 6 (ref 5.0–8.0)

## 2022-12-16 LAB — CBC WITH DIFFERENTIAL/PLATELET
Abs Immature Granulocytes: 0.03 10*3/uL (ref 0.00–0.07)
Basophils Absolute: 0 10*3/uL (ref 0.0–0.1)
Basophils Relative: 0 %
Eosinophils Absolute: 0 10*3/uL (ref 0.0–0.5)
Eosinophils Relative: 0 %
HCT: 48.1 % — ABNORMAL HIGH (ref 36.0–46.0)
Hemoglobin: 14.7 g/dL (ref 12.0–15.0)
Immature Granulocytes: 0 %
Lymphocytes Relative: 9 %
Lymphs Abs: 0.7 10*3/uL (ref 0.7–4.0)
MCH: 27.7 pg (ref 26.0–34.0)
MCHC: 30.6 g/dL (ref 30.0–36.0)
MCV: 90.6 fL (ref 80.0–100.0)
Monocytes Absolute: 0.4 10*3/uL (ref 0.1–1.0)
Monocytes Relative: 5 %
Neutro Abs: 6.4 10*3/uL (ref 1.7–7.7)
Neutrophils Relative %: 86 %
Platelets: 178 10*3/uL (ref 150–400)
RBC: 5.31 MIL/uL — ABNORMAL HIGH (ref 3.87–5.11)
RDW: 14.5 % (ref 11.5–15.5)
WBC: 7.5 10*3/uL (ref 4.0–10.5)
nRBC: 0 % (ref 0.0–0.2)

## 2022-12-16 LAB — COMPREHENSIVE METABOLIC PANEL
ALT: 18 U/L (ref 0–44)
AST: 22 U/L (ref 15–41)
Albumin: 3.6 g/dL (ref 3.5–5.0)
Alkaline Phosphatase: 79 U/L (ref 38–126)
Anion gap: 13 (ref 5–15)
BUN: 20 mg/dL (ref 8–23)
CO2: 20 mmol/L — ABNORMAL LOW (ref 22–32)
Calcium: 9.6 mg/dL (ref 8.9–10.3)
Chloride: 105 mmol/L (ref 98–111)
Creatinine, Ser: 1.71 mg/dL — ABNORMAL HIGH (ref 0.44–1.00)
GFR, Estimated: 30 mL/min — ABNORMAL LOW (ref >60)
Glucose, Bld: 158 mg/dL — ABNORMAL HIGH (ref 70–99)
Potassium: 4 mmol/L (ref 3.5–5.1)
Sodium: 138 mmol/L (ref 135–145)
Total Bilirubin: 0.4 mg/dL (ref 0.3–1.2)
Total Protein: 6.9 g/dL (ref 6.5–8.1)

## 2022-12-16 LAB — RAPID URINE DRUG SCREEN, HOSP PERFORMED
Amphetamines: NOT DETECTED
Barbiturates: NOT DETECTED
Benzodiazepines: NOT DETECTED
Cocaine: NOT DETECTED
Opiates: NOT DETECTED
Tetrahydrocannabinol: NOT DETECTED

## 2022-12-16 LAB — SARS CORONAVIRUS 2 BY RT PCR: SARS Coronavirus 2 by RT PCR: NEGATIVE

## 2022-12-16 LAB — TSH: TSH: 1.188 u[IU]/mL (ref 0.350–4.500)

## 2022-12-16 LAB — I-STAT CG4 LACTIC ACID, ED: Lactic Acid, Venous: 1.6 mmol/L (ref 0.5–1.9)

## 2022-12-16 LAB — CBG MONITORING, ED: Glucose-Capillary: 156 mg/dL — ABNORMAL HIGH (ref 70–99)

## 2022-12-16 MED ORDER — LACTATED RINGERS IV SOLN: INTRAVENOUS | Status: DC

## 2022-12-16 MED ORDER — SODIUM CHLORIDE 0.9 % IV SOLN
2.0000 g | INTRAVENOUS | Status: DC
  Administered 2022-12-16 – 2022-12-18 (×3): 2 g via INTRAVENOUS
  Filled 2022-12-16 (×3): qty 20

## 2022-12-16 MED ORDER — NALOXONE HCL 2 MG/2ML IJ SOSY
PREFILLED_SYRINGE | INTRAMUSCULAR | Status: AC
  Administered 2022-12-16: 2 mg via INTRAVENOUS
  Filled 2022-12-16: qty 2

## 2022-12-16 MED ORDER — SODIUM CHLORIDE 0.9 % IV BOLUS
1000.0000 mL | Freq: Once | INTRAVENOUS | Status: AC
  Administered 2022-12-16: 1000 mL via INTRAVENOUS

## 2022-12-16 MED ORDER — NALOXONE HCL 2 MG/2ML IJ SOSY: 2.0000 mg | PREFILLED_SYRINGE | Freq: Once | INTRAMUSCULAR | Status: AC

## 2022-12-16 MED ORDER — LACTATED RINGERS IV BOLUS
1000.0000 mL | Freq: Once | INTRAVENOUS | Status: AC
  Administered 2022-12-16: 1000 mL via INTRAVENOUS

## 2022-12-16 MED ORDER — ENOXAPARIN SODIUM 30 MG/0.3ML IJ SOSY: 30.0000 mg | PREFILLED_SYRINGE | INTRAMUSCULAR | Status: DC

## 2022-12-16 MED ORDER — ENOXAPARIN SODIUM 30 MG/0.3ML IJ SOSY
30.0000 mg | PREFILLED_SYRINGE | INTRAMUSCULAR | Status: DC
  Administered 2022-12-16 – 2022-12-17 (×2): 30 mg via SUBCUTANEOUS
  Filled 2022-12-16 (×2): qty 0.3

## 2022-12-16 NOTE — Progress Notes (Signed)
Dr. Sherrilee Gilles made aware of EKG result. No new order given.

## 2022-12-16 NOTE — ED Provider Notes (Signed)
[place] EMERGENCY DEPARTMENT AT [facility] Provider Note   CSN: [id number] Arrival date & time: 12/16/22  1036     History {Add pertinent medical, surgical, social history, OB history to HPI:1} Chief Complaint  Patient presents with   Altered Mental Status    DAVENE JOBIN is a 82 y.o. female.  Pt is a 82 yo female with pmhx significant for migraines, skin cancer, gerd, hld, htn, hypothyroidism, and afib s/p ablation (on Eliquis).  Pt had a migraine yesterday, but that is not unusual.  She was her normal self last night when she went to bed.  This am, her husband could not get her to wake up.  She will respond to painful stimuli, but is not speaking or following any commands.       Home Medications Prior to Admission medications   Medication Sig Start Date End Date Taking? Authorizing Provider  baclofen (LIORESAL) 10 MG tablet Take 0.5 tablets by mouth as needed (as needed for migraine pain and neuropathy.).  08/23/19   [provider]  Dextran 70-Hypromellose (ARTIFICIAL TEARS PF OP) Place 1 drop into both eyes daily as needed (For dry eyes).    [provider]  diltiazem (CARDIZEM CD) 240 MG 24 hr capsule TAKE 1 CAPSULE BY MOUTH EVERY DAY 10/25/22   Hilty, Lisette Abu, MD  diltiazem (CARDIZEM) 30 MG tablet Take 2 tablets by mouth every 6 hours as needed for palpitations. 07/26/22   Lanier Prude, MD  ELIQUIS 5 MG TABS tablet TAKE 1 TABLET BY MOUTH TWICE A DAY 11/08/22   Hilty, Lisette Abu, MD  furosemide (LASIX) 20 MG tablet Take 2 tablets (40 mg total) by mouth daily as needed for edema (weight gain and/or swelling). 03/09/21   Marjie Skiff E, PA-C  irbesartan (AVAPRO) 75 MG tablet Take 1 tablet (75 mg total) by mouth daily. 11/21/18   Hilty, Lisette Abu, MD  levothyroxine (SYNTHROID, LEVOTHROID) 75 MCG tablet Take 75 mcg by mouth daily before breakfast.    [provider]  potassium chloride SA (KLOR-CON M20) 20 MEQ tablet TAKE 1 TABLET (20  MEQ TOTAL) BY MOUTH AS NEEDED (TAKE ON DAYS LASIX IS TAKEN). 08/18/21   Hilty, Lisette Abu, MD  promethazine (PHENERGAN) 25 MG tablet Take 25 mg by mouth every 6 (six) hours as needed for nausea. 11/13/17   [provider]  rosuvastatin (CRESTOR) 20 MG tablet Take 1 tablet (20 mg total) by mouth daily. 11/08/22   Chrystie Nose, MD  sertraline (ZOLOFT) 25 MG tablet Take 25 mg by mouth daily.    [provider]  traMADol (ULTRAM) 50 MG tablet Take 50-100 mg by mouth every 4 (four) hours as needed for moderate pain (as needed for migraine pain and neuropathy.).  03/09/16   [provider]      Allergies    Codeine, Sulfonamide derivatives, Triptans, Amlodipine, Chocolate, Onion, and Other    Review of Systems   Review of Systems  Unable to perform ROS: Mental status change  All other systems reviewed and are negative.   Physical Exam Updated Vital Signs BP 128/84   Pulse (!) 121   Temp (!) 94.9 F (34.9 C) (Rectal)   Resp 13   Ht 5\' 7"  (1.702 m)   Wt 69.9 kg   LMP  (LMP Unknown)   SpO2 97%   BMI 24.14 kg/m  Physical Exam Vitals and nursing note reviewed.  HENT:     Head: Normocephalic and atraumatic.  Right Ear: External ear normal.     Left Ear: External ear normal.     Nose: Nose normal.     Mouth/Throat:     Mouth: Mucous membranes are dry.  Eyes:     Extraocular Movements: Extraocular movements intact.     Conjunctiva/sclera: Conjunctivae normal.     Pupils: Pupils are equal, round, and reactive to light.  Cardiovascular:     Rate and Rhythm: Tachycardia present. Rhythm irregular.     Pulses: Normal pulses.     Heart sounds: Normal heart sounds.  Pulmonary:     Effort: Pulmonary effort is normal.     Breath sounds: Normal breath sounds.  Abdominal:     General: Abdomen is flat. Bowel sounds are normal.     Palpations: Abdomen is soft.  Musculoskeletal:        General: Normal range of motion.     Cervical back: Normal range of motion  and neck supple.  Skin:    General: Skin is warm.     Capillary Refill: Capillary refill takes less than 2 seconds.  Neurological:     Mental Status: She is lethargic.     Comments: Pt does respond to painful stimuli.  She is moving both arms and legs.  Psychiatric:     Comments: Unable to assess     ED Results / Procedures / Treatments   Labs (all labs ordered are listed, but only abnormal results are displayed) Labs Reviewed  CBC WITH DIFFERENTIAL/PLATELET - Abnormal; Notable for the following components:      Result Value   RBC 5.31 (*)    HCT 48.1 (*)    All other components within normal limits  COMPREHENSIVE METABOLIC PANEL - Abnormal; Notable for the following components:   CO2 20 (*)    Glucose, Bld 158 (*)    Creatinine, Ser 1.71 (*)    GFR, Estimated 30 (*)    All other components within normal limits  URINALYSIS, ROUTINE W REFLEX MICROSCOPIC - Abnormal; Notable for the following components:   APPearance HAZY (*)    Hgb urine dipstick SMALL (*)    Protein, ur 100 (*)    Nitrite POSITIVE (*)    Leukocytes,Ua TRACE (*)    Bacteria, UA MANY (*)    All other components within normal limits  CBG MONITORING, ED - Abnormal; Notable for the following components:   Glucose-Capillary 156 (*)    All other components within normal limits  CULTURE, BLOOD (ROUTINE X 2)  CULTURE, BLOOD (ROUTINE X 2)  SARS CORONAVIRUS 2 BY RT PCR  CULTURE, BLOOD (SINGLE)  RAPID URINE DRUG SCREEN, HOSP PERFORMED  TSH  I-STAT CG4 LACTIC ACID, ED    EKG EKG Interpretation Date/Time:  Saturday December 16 2022 10:40:48 EDT Ventricular Rate:  122 PR Interval:  195 QRS Duration:  98 QT Interval:  365 QTC Calculation: 520 R Axis:   -63  Text Interpretation: Sinus or ectopic atrial tachycardia Inferior infarct, age indeterminate Lateral leads are also involved Prolonged QT interval Since last tracing rate faster Confirmed by Jacalyn Lefevre [phone]) on 12/16/2022 1:09:19 PM  Radiology CT  Head Wo Contrast  Result Date: 12/16/2022 CLINICAL DATA:  Provided history: Mental status change, unknown cause. EXAM: CT HEAD WITHOUT CONTRAST TECHNIQUE: Contiguous axial images were obtained from the base of the skull through the vertex without intravenous contrast. RADIATION DOSE REDUCTION: This exam was performed according to the departmental dose-optimization program which includes automated exposure control, adjustment of the mA and/or  kV according to patient size and/or use of iterative reconstruction technique. COMPARISON:  Brain MRI 06/03/2020. FINDINGS: Brain: Generalized cerebral atrophy. Patchy and ill-defined hypoattenuation within the cerebral white matter, nonspecific but compatible with mild chronic small vessel ischemic disease. There is no acute intracranial hemorrhage. No demarcated cortical infarct. No extra-axial fluid collection. No evidence of an intracranial mass. No midline shift. Vascular: No hyperdense vessel.  Atherosclerotic calcifications. Skull: No calvarial fracture or aggressive osseous lesion. Sinuses/Orbits: No mass or acute finding within the imaged orbits. No significant paranasal sinus disease at the imaged levels. IMPRESSION: 1. No evidence of an acute intracranial abnormality. 2. Parenchymal atrophy and mild cerebral white matter chronic small vessel ischemic disease. Electronically Signed   By: Jackey Loge D.O.   On: 12/16/2022 12:41   DG Chest Portable 1 View  Result Date: 12/16/2022 CLINICAL DATA:  Altered mental status. EXAM: PORTABLE CHEST 1 VIEW COMPARISON:  None Available. FINDINGS: Normal mediastinum and cardiac silhouette. Normal pulmonary vasculature. No evidence of effusion, infiltrate, or pneumothorax. No acute bony abnormality. IMPRESSION: No acute cardiopulmonary process. Electronically Signed   By: Genevive Bi M.D.   On: 12/16/2022 11:22    Procedures Procedures  {Document cardiac monitor, telemetry assessment procedure when  appropriate:1}  Medications Ordered in ED Medications  lactated ringers infusion ( Intravenous New Bag/Given 12/16/22 1234)  cefTRIAXone (ROCEPHIN) 2 g in sodium chloride 0.9 % 100 mL IVPB (2 g Intravenous New Bag/Given 12/16/22 1234)  sodium chloride 0.9 % bolus 1,000 mL (0 mLs Intravenous Stopped 12/16/22 1232)  naloxone (NARCAN) injection 2 mg (2 mg Intravenous Given 12/16/22 1056)    ED Course/ Medical Decision Making/ A&P   {   Click here for ABCD2, HEART and other calculatorsREFRESH Note before signing :1}                              Medical Decision Making Amount and/or Complexity of Data Reviewed Labs: ordered. Radiology: ordered.  Risk Prescription drug management. Decision regarding hospitalization.   This patient presents to the ED for concern of ams, this involves an extensive number of treatment options, and is a complaint that carries with it a high risk of complications and morbidity.  The differential diagnosis includes infection, cva, electrolyte abn, drug od   Co morbidities that complicate the patient evaluation  migraines, skin cancer, gerd, hld, htn, hypothyroidism, and afib s/p ablation (on Eliquis)   Additional history obtained:  Additional history obtained from epic chart review External records from outside source obtained and reviewed including ems/family   Lab Tests:  I Ordered, and personally interpreted labs.  The pertinent results include:  cbc nl, cmp nl other than cr elevated at 1.71 (1.11 last year), uds neg, ua+ uti   Imaging Studies ordered:  I ordered imaging studies including ct head, cxr, mri brain  I independently visualized and interpreted imaging which showed  CXR: No acute cardiopulmonary process.  CT head: . No evidence of an acute intracranial abnormality.  2. Parenchymal atrophy and mild cerebral white matter chronic small  vessel ischemic disease.   I agree with the radiologist interpretation   Cardiac  Monitoring:  The patient was maintained on a cardiac monitor.  I personally viewed and interpreted the cardiac monitored which showed an underlying rhythm of: nsr   Medicines ordered and prescription drug management:  I ordered medication including rocephin  for uti  Reevaluation of the patient after these medicines showed that the  patient improved I have reviewed the patients home medicines and have made adjustments as needed   Test Considered:  Ct, mri   Critical Interventions:  abx   Consultations Obtained:  I requested consultation with the ***,  and discussed lab and imaging findings as well as pertinent plan - they recommend: ***   Problem List / ED Course:  UTI:  rocephin given Sepsis:  pt does meet sepsis criteria with hypothermia and ms change and uti.   AMS:  We did give pt narcan to see if that would help, but pt had no change in ms.  It is likely due to urosepsis, but MRI brain ordered to r/o cva   Reevaluation:  After the interventions noted above, I reevaluated the patient and found that they have :improved   Social Determinants of Health:  Lives at home   Dispostion:  After consideration of the diagnostic results and the patients response to treatment, I feel that the patent would benefit from admission.  CRITICAL CARE Performed by: Jacalyn Lefevre   Total critical care time: 30 minutes  Critical care time was exclusive of separately billable procedures and treating other patients.  Critical care was necessary to treat or prevent imminent or life-threatening deterioration.  Critical care was time spent personally by me on the following activities: development of treatment plan with patient and/or surrogate as well as nursing, discussions with consultants, evaluation of patient's response to treatment, examination of patient, obtaining history from patient or surrogate, ordering and performing treatments and interventions, ordering and review of  laboratory studies, ordering and review of radiographic studies, pulse oximetry and re-evaluation of patient's condition.     {Document critical care time when appropriate:1} {Document review of labs and clinical decision tools ie heart score, Chads2Vasc2 etc:1}  {Document your independent review of radiology images, and any outside records:1} {Document your discussion with family members, caretakers, and with consultants:1} {Document social determinants of health affecting pt's care:1} {Document your decision making why or why not admission, treatments were needed:1} Final Clinical Impression(s) / ED Diagnoses Final diagnoses:  Sepsis with encephalopathy without septic shock, due to unspecified organism [facility])  Acute cystitis without hematuria    Rx / DC Orders ED Discharge Orders     None

## 2022-12-16 NOTE — ED Notes (Signed)
CT notified that pt is ready to go at this time. CT states they are going to take pt as soon as they can. Will continue to monitor.

## 2022-12-16 NOTE — ED Notes (Signed)
Taken to MRI by Engineer, manufacturing systems. Pt will then go to [id number]. Will notify 3W.

## 2022-12-16 NOTE — H&P (Cosign Needed Addendum)
Date: 12/16/2022               Patient Name:  Bridget [name] MRN: [id number]  DOB: 1940/10/26 Age / Sex: 82 y.o., female   PCP: Marylen Ponto, MD         Medical Service: Internal Medicine Teaching Service         Attending Physician: Dr. Ginnie Smart, MD      First Contact: Annett Fabian, MD Pager: [phone]    Second Contact: Rana Snare, DO Pager: [phone]         After Hours (After 5p/  First Contact Pager: [phone]  weekends / holidays): Second Contact Pager: [phone]   SUBJECTIVE   Chief Complaint: unresponsive  History of Present Illness: Bridget [name] is a 82 y.o. female with PMH of Afib s/p ablation (2021), HTN, HLD, GERD, migraines, and hypothyroidism who presents after not responding this morning. All of the history was provided by her spouse and daughter, who were bedside, since the patient is unresponsive.  They state that her last known well was around 8pm last night at home.  She sat down in her chair at that time and fell asleep.  When her husband tried to wake her this morning, she was unresponsive, so he called his daughter and they called EMS to bring her to the emergency department.  Her husband states that her behavior had been at baseline prior to her falling asleep.  He does note that she had a migraine yesterday, but said she has chronic migraines.  He denies any recent illnesses, sick contacts, changes in behavior, new medications.  He says that at her baseline she is relatively independent and able to walk, dress herself, use the bathroom, responds verbally and with motor movements appropriately. Her daughter manages her medications and puts them into a pill container, which her husband then hands to her to take each day. Of note, they state they have been letting her have her Tramadol bedside, so she may have taken too many of them.   ED Course: On presentation to the emergency department she was tachycardic with a heart rate of 121, blood pressure was  within normal limits, and she was hypothermic with a temperature of 94.9.  Urinalysis was consistent with a UTI, so she was started on Rocephin 2g.  EKG showed sinus tachycardia and a prolonged QT interval.  CT head was unremarkable.  MRI brain was ordered to rule out CVA, pending. Naloxone was administered with no change. She was given 1L NS and started on LR continuous at 150cc/hr.  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    Dextran 70-Hypromellose (ARTIFICIAL TEARS PF OP) Place 1 drop into both eyes daily as needed (For dry eyes).   diltiazem (CARDIZEM CD) 240 MG 24 hr capsule TAKE 1 CAPSULE BY MOUTH EVERY DAY   diltiazem (CARDIZEM) 30 MG tablet Take 2 tablets by mouth every 6 hours as needed for palpitations.   ELIQUIS 5 MG TABS tablet TAKE 1 TABLET BY MOUTH TWICE A DAY   irbesartan (AVAPRO) 75 MG tablet Take 1 tablet (75 mg total) by mouth daily.    levothyroxine (SYNTHROID, LEVOTHROID) 75 MCG tablet Take 75 mcg by mouth daily before breakfast.   memantine (NAMENDA) 5 MG tablet Take 5 mg by mouth 2 (two) times daily.   rosuvastatin (CRESTOR) 20 MG tablet Take 1 tablet (20 mg total) by mouth daily.   sertraline (ZOLOFT) 25 MG tablet Take 25 mg by mouth daily.   traMADol (ULTRAM) 50 MG tablet Take 50-100 mg by mouth every 4 (four) hours as needed for moderate pain (as needed for migraine pain and neuropathy.).    [DISCONTINUED] metoprolol tartrate (LOPRESSOR) 25 MG tablet Take 25 mg by mouth 2 (two) times daily.   Past Surgical History Past Surgical History:  Procedure Laterality Date   ATRIAL FIBRILLATION ABLATION N/A 12/16/2019   Procedure: ATRIAL FIBRILLATION ABLATION;  Surgeon: Hillis Range, MD;  Location: [facility] INVASIVE CV LAB;  Service: Cardiovascular;  Laterality: N/A;   CARDIOVERSION N/A 05/02/2019   Procedure: CARDIOVERSION;  Surgeon: Chrystie Nose, MD;  Location: [facility] ENDOSCOPY;  Service: Cardiovascular;  Laterality: N/A;   CARDIOVERSION N/A 05/14/2019   Procedure: CARDIOVERSION;  Surgeon: Parke Poisson, MD;  Location: [facility] ENDOSCOPY;  Service: Cardiovascular;  Laterality: N/A;   CATARACT EXTRACTION, BILATERAL     CERVICAL DISCECTOMY  2005   with fusion   CHOLECYSTECTOMY  2006   COLONOSCOPY     LUMBAR LAMINECTOMY/DECOMPRESSION MICRODISCECTOMY Right 11/13/2014   Procedure: Right Lumbar four-five Microdiskectomy;  Surgeon: Hilda Lias, MD;  Location: [facility] NEURO ORS;  Service: Neurosurgery;  Laterality: Right;  right   POLYPECTOMY     REPLACEMENT TOTAL KNEE Right 05/2012   TEE WITHOUT CARDIOVERSION N/A 05/02/2019   Procedure: TRANSESOPHAGEAL ECHOCARDIOGRAM (TEE);  Surgeon: Chrystie Nose, MD;  Location: [facility] ENDOSCOPY;  Service: Cardiovascular;  Laterality: N/A;   TOTAL ABDOMINAL HYSTERECTOMY  1978   TOTAL HIP ARTHROPLASTY Left 04/2011   TRANSTHORACIC ECHOCARDIOGRAM  12/2008   EF=>55%, borderline conc LVH; trace MR; mod TR; trace  AV regurg   Social:  Lives With: husband  Support: husband and daughter Level of Function: independent with ADLs, no driving or cooking  PCP: Marylen Ponto, MD Substances: husband denies tobacco, alcohol and recreational drug use for patient  Family History:  Family History  Problem Relation Age of Onset   Stroke Mother 52   Coronary artery disease Father 67   Colon polyps Father    Colon cancer Cousin    Esophageal cancer Cousin    Stroke Maternal Grandfather    Stroke Paternal Grandmother    Breast cancer Paternal Grandmother    Suicidality Daughter    Rectal cancer Neg Hx    Stomach cancer Neg Hx    Tremor Neg Hx    Parkinson's disease Neg  Hx    Alzheimer's disease Neg Hx    Dementia Neg Hx    Allergies: Allergies as of 12/16/2022 - Review Complete 12/16/2022  Allergen Reaction Noted   Codeine Anaphylaxis 08/21/2007   Sulfonamide derivatives Anaphylaxis 08/21/2007   Triptans Other (See Comments) 07/30/2013   Amlodipine Other (See Comments) 01/20/2013   Chocolate Other (See Comments) 12/09/2019   Onion Other (See Comments) 12/09/2019   Other  08/21/2007    Review of Systems: A complete ROS was negative except as per HPI.   OBJECTIVE:   Physical Exam: Blood pressure 131/80, pulse (!) 125, temperature 97.9 F (36.6 C), temperature source Oral, resp. rate 17, height 5\' 7"  (1.702 m), weight 69.9 kg, SpO2 95%.  Constitutional: minimally responsive, withdraws to painful stimuli but no verbal or motor response to command HENT: normocephalic atraumatic, mucous membranes dry Eyes: pupils equal, round, normal in size but minimal reaction to light Neck: supple Cardiovascular: tachycardia, irregular rhythm, no m/r/g, no LE edema, distal pulses present Pulmonary/Chest: normal work of breathing on room air, lungs clear to auscultation bilaterally Abdominal: soft, non-tender, non-distended, normal bowel sounds MSK: normal in appearance; unable to assess ROM given  lethargy Neurological: responds to painful stimuli, moves arms and legs and opens eyes slightly spontaneously but not to command Skin: diffusely dry skin, onychomycosis on left first toe, no lesions or rashes noted  Labs: CBC    Component Value Date/Time   WBC 7.5 12/16/2022 1057   RBC 5.31 (H) 12/16/2022 1057   HGB 14.7 12/16/2022 1057   HGB 13.1 12/02/2021 1617   HCT 48.1 (H) 12/16/2022 1057   HCT 40.4 12/02/2021 1617   PLT 178 12/16/2022 1057   PLT 168 12/02/2021 1617   MCV 90.6 12/16/2022 1057   MCV 88 12/02/2021 1617   MCH 27.7 12/16/2022 1057   MCHC 30.6 12/16/2022 1057   RDW 14.5 12/16/2022 1057   RDW 13.3 12/02/2021 1617   LYMPHSABS 0.7 12/16/2022 1057   LYMPHSABS 1.3 06/01/2020 1216   MONOABS 0.4 12/16/2022 1057   EOSABS 0.0 12/16/2022 1057   EOSABS 0.2 06/01/2020 1216   BASOSABS 0.0 12/16/2022 1057   BASOSABS 0.0 06/01/2020 1216     CMP     Component Value Date/Time   NA 138 12/16/2022 1057   NA 147 (H) 12/02/2021 1617   K 4.0 12/16/2022 1057   CL 105 12/16/2022 1057   CO2 20 (L) 12/16/2022 1057   GLUCOSE 158 (H) 12/16/2022 1057   BUN 20 12/16/2022 1057   BUN 12 12/02/2021 1617   CREATININE 1.71 (H) 12/16/2022 1057   CREATININE 0.90 04/14/2016 1155   CALCIUM 9.6 12/16/2022 1057   PROT 6.9 12/16/2022 1057   PROT 6.5 12/02/2021 1617   ALBUMIN 3.6 12/16/2022 1057   ALBUMIN 4.1 12/02/2021 1617   AST 22 12/16/2022 1057   ALT 18 12/16/2022 1057   ALKPHOS 79 12/16/2022 1057   BILITOT 0.4 12/16/2022 1057   BILITOT 0.4 12/02/2021 1617   GFRNONAA 30 (L) 12/16/2022 1057   GFRAA 46 (L) 12/09/2019 1152    Imaging:  CT head w/o contrast:  IMPRESSION: 1. No evidence of an acute intracranial abnormality. 2. Parenchymal atrophy and mild cerebral white matter chronic small vessel ischemic disease.  MRI Brain IMPRESSION: No acute or reversible finding. Generalized age related volume loss. Moderate chronic small-vessel ischemic changes of the  cerebral hemispheric white matter.  CXR: IMPRESSION: No acute cardiopulmonary process.  EKG: personally reviewed my interpretation is atrial tachycardia, regular rhythm  ASSESSMENT &  PLAN:   Assessment & Plan by Problem: Principal Problem:   Acute encephalopathy  Laruen Wahlstrom Michals is a 82 y.o. person living with a past medical history of Afib s/p ablation (2021), HTN, HLD, GERD, migraines, and hypothyroidism who presents after being found unresponsive this morning and is admitted for acute encephalopathy possibly secondary to a UTI on hospital day 0  Acute encephalopathy Sepsis, possibly secondary to urinary tract infection Urinalysis had nitrites and many bacteria, consistent with a urinary tract infection. Blood cultures pending. Hypothermic on presentation with temp of 94.9, started on warmer. Afebrile, blood pressure WNL, tachycardic into 120s. Encephalopathy possibly secondary to infection, most likely urinary tract origin. No other infectious sources noted. CXR, CT head unremarkable. Lungs CTA, no apparent skin wounds on exam. Negative UDS and no response to narcan make toxin/pharmacologic encephalopathy less likely, however daughter says patient has tramadol bottle bedside and may have taken more than she should. Creatinine elevated at 1.7, unclear baseline but possible AKI. Electrolytes otherwise within normal range.  -Continue ceftriaxone 2 g -F/u urine, blood cultures -Continue LR fluids -External warming for hypothermia -NPO -Trend CBC, BMP  History of Atrial fibrillation s/p ablation History of several cardioversions and an ablation in 2021. Tachycardic in 120s on presentation, but regular rate, not in Afib. Hemodynamically stable. Missed medications this morning. On Eliquis 5 mg BID, diltiazem 240 mg daily. Also taking diltiazem 30 mg q6h prn for palpitations. -Holding oral medications for now -Lovenox for anticoagulation  Chronic conditions: home meds held for now since  she is obtunded and unable to take PO meds Hypertension- BP is WNL. On irbesartan 75 mg daily, diltiazem 240 mg daily. Hypothyroidism- TSH pending. Last TSH was 2.8 in 2023. On synthroid 75 mcg daily.  Memory loss- Follows with neurology. On memantine 5 mg BID. Hyperlipidemia- on crestor 20 mg daily Depression- on Zoloft 25 mg daily LE edema- Lasix 40 mg prn for swelling Chronic back pain/neuropathy- baclofen 10 mg daily Nausea- promethazine 25 mg daily Potassium 20 meq when lasix is taken Migraines- Tramadol 50 mg q4h as needed  Diet: NPO due to lethargy VTE: Enoxaparin IVF: LR,125cc/hr Code: Full  Prior to Admission Living Arrangement: Home, living with her husband Anticipated Discharge Location: Home Barriers to Discharge: medical workup  Dispo: Admit patient to Inpatient with expected length of stay greater than 2 midnights.  Signed: Annett Fabian, MD Internal Medicine Resident PGY-1 12/16/2022, 4:50 PM   Please contact on call pager, weekdays after 5pm and weekends after 1pm, at [phone].

## 2022-12-16 NOTE — Progress Notes (Signed)
Evaluated at bedside due to concern for tachycardia in this patient admitted for sepsis from suspected UTI. Family at bedside. Nurse present, reports no significant change in status since coming on for shift around ~7 p.m. On my exam she is tachycardic to ~120, breathing approximately 20 breaths per minute. She is unresponsive to verbal stimuli, withdraws from pain. She has strong, regular radial pulses. Her breathing is regular with snoring respirations occasionally. An EKG from prior shows sinus tachycardia versus accelerated junctional rhythm.  This 82 year old is admitted for severe sepsis from UTI complicated by encephalopathy. She is treated empirically with ceftriaxone. She has gotten ~2 L IV fluids via bolus and infusion. I will order a repeat bolus of 1 L as she is still tachycardic. She has had little change in her clinical status since first dose of ceftriaxone ~8 hours ago. Blood and urine cultures are processing but I will maintain low threshold for broadening to include MRSA and pseudomonas coverage for non-improving or worsening clinical status. Will continue to monitor closely overnight.  Update 1:25 AM. Nurse reports episode of vomiting and loose stool. She was awake briefly and recognized daughter at bedside. On evaluation she remains unresponsive. Tachycardic to 120. Respiratory rate 18, lungs clear, breathing comfortably. Abdomen is soft and non-tender. Repeat 1 L bolus of LR.  Marrianne Mood MD 12/16/2022, 10:12 PM

## 2022-12-16 NOTE — Sepsis Progress Note (Signed)
Elink following code sepsis °

## 2022-12-16 NOTE — ED Triage Notes (Signed)
Lives with husband. Daughter states pt was acting fine last night with LKW at 2000. Pt fell asleep on the recliner. Husband states pt has been sleeping since then. Pt is only responsive to pain. EMS gave 1mg  IV Narcan at 1006 with no change. Sluggish pupils. Pt is on trazodone. Baseline is A&Ox4 and independent.

## 2022-12-16 NOTE — ED Notes (Signed)
ED TO INPATIENT HANDOFF REPORT  ED Nurse Name and Phone #: Sheilah Mins [phone]  S Name/Age/Gender Bridget [name] 82 y.o. female Room/Bed: RESUSC/RESUSC  Code Status   Code Status: Full Code  Home/SNF/Other Home Disoriented Is this baseline? No   Triage Complete: Triage complete  Chief Complaint Acute encephalopathy [G93.40]  Triage Note Lives with husband. Daughter states pt was acting fine last night with LKW at 2000. Pt fell asleep on the recliner. Husband states pt has been sleeping since then. Pt is only responsive to pain. EMS gave 1mg  IV Narcan at 1006 with no change. Sluggish pupils. Pt is on trazodone. Baseline is A&Ox4 and independent.    Allergies Allergies  Allergen Reactions   Codeine Anaphylaxis    Other Reaction(s): GI Intolerance   Sulfonamide Derivatives Anaphylaxis   Triptans Other (See Comments)    Stopped blood supply to mesenteric artery per pt  Other Reaction(s): Other (See Comments)   Amlodipine Other (See Comments)    swelling   Chocolate Other (See Comments)    Headache   Onion Other (See Comments)    Headache   Other     MSG- headaches    Level of Care/Admitting Diagnosis ED Disposition     ED Disposition  Admit   Condition  --   Comment  Hospital Area: [facility] [[id number]]  Level of Care: Progressive [102]  Admit to Progressive based on following criteria: MULTISYSTEM THREATS such as stable sepsis, metabolic/electrolyte imbalance with or without encephalopathy that is responding to early treatment.  Admit to Progressive based on following criteria: NEUROLOGICAL AND NEUROSURGICAL complex patients with significant risk of instability, who do not meet ICU criteria, yet require close observation or frequent assessment (< / = every 2 - 4 hours) with medical / nursing intervention.  May admit patient to [facility] or [facility] if equivalent level of care is available:: No  Covid Evaluation: Confirmed COVID Negative   Diagnosis: Acute encephalopathy [562130]  Admitting Physician: Ginnie Smart [[id number]]  Attending Physician: Ninetta Lights, JEFFREY C [[id number]]  Certification:: I certify this patient will need inpatient services for at least 2 midnights  Expected Medical Readiness: 12/20/2022          B Medical/Surgery History 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   Past Surgical History:  Procedure Laterality Date   ATRIAL FIBRILLATION ABLATION N/A 12/16/2019   Procedure: ATRIAL FIBRILLATION ABLATION;  Surgeon: Hillis Range, MD;  Location: [facility] INVASIVE CV LAB;  Service: Cardiovascular;  Laterality: N/A;   CARDIOVERSION N/A 05/02/2019   Procedure: CARDIOVERSION;  Surgeon: Chrystie Nose, MD;  Location: [facility] ENDOSCOPY;  Service: Cardiovascular;  Laterality: N/A;   CARDIOVERSION N/A 05/14/2019   Procedure: CARDIOVERSION;  Surgeon: Parke Poisson, MD;  Location: [facility] ENDOSCOPY;  Service: Cardiovascular;  Laterality: N/A;   CATARACT EXTRACTION, BILATERAL     CERVICAL DISCECTOMY  2005   with fusion   CHOLECYSTECTOMY  2006   COLONOSCOPY     LUMBAR LAMINECTOMY/DECOMPRESSION MICRODISCECTOMY Right 11/13/2014   Procedure: Right Lumbar four-five Microdiskectomy;  Surgeon: Hilda Lias, MD;  Location: [facility] NEURO ORS;  Service: Neurosurgery;  Laterality: Right;  right   POLYPECTOMY     REPLACEMENT TOTAL KNEE Right 05/2012   TEE WITHOUT CARDIOVERSION N/A 05/02/2019   Procedure: TRANSESOPHAGEAL ECHOCARDIOGRAM (TEE);  Surgeon:  Chrystie Nose, MD;  Location: [facility] ENDOSCOPY;  Service: Cardiovascular;  Laterality: N/A;   TOTAL ABDOMINAL HYSTERECTOMY  1978   TOTAL HIP ARTHROPLASTY Left 04/2011   TRANSTHORACIC ECHOCARDIOGRAM  12/2008   EF=>55%, borderline conc LVH; trace MR; mod TR; trace AV regurg     A IV Location/Drains/Wounds Patient Lines/Drains/Airways Status     Active Line/Drains/Airways     Name Placement date Placement time Site Days   Peripheral IV 12/16/22 20 G Right Antecubital 12/16/22  1047  Antecubital  less than 1   Peripheral IV 12/16/22 18 G Anterior;Left;Proximal Forearm 12/16/22  1055  Forearm  less than 1            Intake/Output Last 24 hours  Intake/Output Summary (Last 24 hours) at 12/16/2022 1431 Last data filed at 12/16/2022 1332 Gross per 24 hour  Intake 1100 ml  Output --  Net 1100 ml    Labs/Imaging Results for orders placed or performed during the hospital encounter of 12/16/22 (from the past 48 hour(s))  Urinalysis, Routine w reflex microscopic -Urine, Catheterized     Status: Abnormal   Collection Time: 12/16/22 10:46 AM  Result Value Ref Range   Color, Urine YELLOW YELLOW   APPearance HAZY (A) CLEAR   Specific Gravity, Urine 1.009 1.005 - 1.030   pH 6.0 5.0 - 8.0   Glucose, UA NEGATIVE NEGATIVE mg/dL   Hgb urine dipstick SMALL (A) NEGATIVE   Bilirubin Urine NEGATIVE NEGATIVE   Ketones, ur NEGATIVE NEGATIVE mg/dL   Protein, ur 528 (A) NEGATIVE mg/dL   Nitrite POSITIVE (A) NEGATIVE   Leukocytes,Ua TRACE (A) NEGATIVE   RBC / HPF 0-5 0 - 5 RBC/hpf   WBC, UA 11-20 0 - 5 WBC/hpf   Bacteria, UA MANY (A) NONE SEEN   Squamous Epithelial / HPF 0-5 0 - 5 /HPF   Mucus PRESENT     Comment: Performed at [facility], [address] [place], [place] [address]  CBG monitoring, ED     Status: Abnormal   Collection Time: 12/16/22 10:47 AM  Result Value Ref Range   Glucose-Capillary 156 (H) 70 - 99 mg/dL    Comment: Glucose reference range applies only to samples  taken after fasting for at least 8 hours.  CBC with Differential     Status: Abnormal   Collection Time: 12/16/22 10:57 AM  Result Value Ref Range   WBC 7.5 4.0 - 10.5 K/uL   RBC 5.31 (H) 3.87 - 5.11 MIL/uL   Hemoglobin 14.7 12.0 - 15.0 g/dL   HCT 40.1 (H) 02.7 - 25.3 %   MCV 90.6 80.0 - 100.0 fL   MCH 27.7 26.0 - 34.0 pg   MCHC 30.6 30.0 - 36.0 g/dL   RDW 66.4 40.3 - 47.4 %   Platelets 178 150 - 400 K/uL   nRBC 0.0 0.0 - 0.2 %   Neutrophils Relative % 86 %   Neutro Abs 6.4 1.7 - 7.7 K/uL   Lymphocytes Relative 9 %   Lymphs Abs 0.7 0.7 - 4.0 K/uL   Monocytes Relative 5 %   Monocytes Absolute  0.4 0.1 - 1.0 K/uL   Eosinophils Relative 0 %   Eosinophils Absolute 0.0 0.0 - 0.5 K/uL   Basophils Relative 0 %   Basophils Absolute 0.0 0.0 - 0.1 K/uL   Immature Granulocytes 0 %   Abs Immature Granulocytes 0.03 0.00 - 0.07 K/uL    Comment: Performed at [facility], [address], [place], [place] [address]  Comprehensive metabolic panel     Status: Abnormal   Collection Time: 12/16/22 10:57 AM  Result Value Ref Range   Sodium 138 135 - 145 mmol/L   Potassium 4.0 3.5 - 5.1 mmol/L   Chloride 105 98 - 111 mmol/L   CO2 20 (L) 22 - 32 mmol/L   Glucose, Bld 158 (H) 70 - 99 mg/dL    Comment: Glucose reference range applies only to samples taken after fasting for at least 8 hours.   BUN 20 8 - 23 mg/dL   Creatinine, Ser 1.91 (H) 0.44 - 1.00 mg/dL   Calcium 9.6 8.9 - 47.8 mg/dL   Total Protein 6.9 6.5 - 8.1 g/dL   Albumin 3.6 3.5 - 5.0 g/dL   AST 22 15 - 41 U/L   ALT 18 0 - 44 U/L   Alkaline Phosphatase 79 38 - 126 U/L   Total Bilirubin 0.4 0.3 - 1.2 mg/dL   GFR, Estimated 30 (L) >60 mL/min    Comment: (NOTE) Calculated using the CKD-EPI Creatinine Equation (2021)    Anion gap 13 5 - 15    Comment: Performed at [facility], [address], [place], [place] [address]  Rapid urine drug screen (hospital performed)     Status: None   Collection Time: 12/16/22 11:14 AM   Result Value Ref Range   Opiates NONE DETECTED NONE DETECTED   Cocaine NONE DETECTED NONE DETECTED   Benzodiazepines NONE DETECTED NONE DETECTED   Amphetamines NONE DETECTED NONE DETECTED   Tetrahydrocannabinol NONE DETECTED NONE DETECTED   Barbiturates NONE DETECTED NONE DETECTED    Comment: (NOTE) DRUG SCREEN FOR MEDICAL PURPOSES ONLY.  IF CONFIRMATION IS NEEDED FOR ANY PURPOSE, NOTIFY LAB WITHIN 5 DAYS.  LOWEST DETECTABLE LIMITS FOR URINE DRUG SCREEN Drug Class                     Cutoff (ng/mL) Amphetamine and metabolites    1000 Barbiturate and metabolites    200 Benzodiazepine                 200 Opiates and metabolites        300 Cocaine and metabolites        300 THC                            50 Performed at [facility], [address], [place], [place] [address]   SARS Coronavirus 2 by RT PCR (hospital order, performed in [facility] hospital lab) *cepheid single result test* Anterior Nasal Swab     Status: None   Collection Time: 12/16/22 12:32 PM   Specimen: Anterior Nasal Swab  Result Value Ref Range   SARS Coronavirus 2 by RT PCR NEGATIVE NEGATIVE    Comment: Performed at [facility], [address], [place], [place] [address]  I-Stat Lactic Acid     Status: None   Collection Time: 12/16/22 12:49 PM  Result Value Ref Range   Lactic Acid, Venous 1.6 0.5 - 1.9  mmol/L   CT Head Wo Contrast  Result Date: 12/16/2022 CLINICAL DATA:  Provided history: Mental status change, unknown cause. EXAM: CT HEAD WITHOUT CONTRAST TECHNIQUE: Contiguous axial images were obtained from the base of the skull through the vertex without intravenous contrast. RADIATION DOSE REDUCTION: This exam was performed according to the departmental dose-optimization program which includes automated exposure control, adjustment of the Jamirra Curnow and/or kV according to patient size and/or use of iterative reconstruction technique. COMPARISON:  Brain MRI 06/03/2020. FINDINGS: Brain: Generalized  cerebral atrophy. Patchy and ill-defined hypoattenuation within the cerebral white matter, nonspecific but compatible with mild chronic small vessel ischemic disease. There is no acute intracranial hemorrhage. No demarcated cortical infarct. No extra-axial fluid collection. No evidence of an intracranial mass. No midline shift. Vascular: No hyperdense vessel.  Atherosclerotic calcifications. Skull: No calvarial fracture or aggressive osseous lesion. Sinuses/Orbits: No mass or acute finding within the imaged orbits. No significant paranasal sinus disease at the imaged levels. IMPRESSION: 1. No evidence of an acute intracranial abnormality. 2. Parenchymal atrophy and mild cerebral white matter chronic small vessel ischemic disease. Electronically Signed   By: Jackey Loge D.O.   On: 12/16/2022 12:41   DG Chest Portable 1 View  Result Date: 12/16/2022 CLINICAL DATA:  Altered mental status. EXAM: PORTABLE CHEST 1 VIEW COMPARISON:  None Available. FINDINGS: Normal mediastinum and cardiac silhouette. Normal pulmonary vasculature. No evidence of effusion, infiltrate, or pneumothorax. No acute bony abnormality. IMPRESSION: No acute cardiopulmonary process. Electronically Signed   By: Genevive Bi M.D.   On: 12/16/2022 11:22    Pending Labs Unresulted Labs (From admission, onward)     Start     Ordered   12/17/22 0500  CBC  Tomorrow morning,   R        12/16/22 1422   12/17/22 0500  Basic metabolic panel  Tomorrow morning,   R        12/16/22 1422   12/16/22 1309  TSH  Once,   URGENT        12/16/22 1308   12/16/22 1216  Culture, blood (single)  (Undifferentiated -> Now sepsis confirmed (treatment and sepsis specific nursing orders))  ONCE - STAT,   STAT        12/16/22 1215   12/16/22 1215  Culture, blood (routine x 2)  BLOOD CULTURE X 2,   R (with STAT occurrences)      12/16/22 1215            Vitals/Pain Today's Vitals   12/16/22 1215 12/16/22 1332 12/16/22 1333 12/16/22 1400  BP: 128/84   131/80 133/89  Pulse: (!) 121  (!) 125 (!) 127  Resp: 13  17 18   Temp:  97.9 F (36.6 C)    TempSrc:  Oral    SpO2: 97%  95% 97%  Weight:      Height:      PainSc:        Isolation Precautions No active isolations  Medications Medications  lactated ringers infusion ( Intravenous New Bag/Given 12/16/22 1234)  cefTRIAXone (ROCEPHIN) 2 g in sodium chloride 0.9 % 100 mL IVPB (0 g Intravenous Stopped 12/16/22 1332)  enoxaparin (LOVENOX) injection 30 mg (has no administration in time range)  sodium chloride 0.9 % bolus 1,000 mL (0 mLs Intravenous Stopped 12/16/22 1232)  naloxone (NARCAN) injection 2 mg (2 mg Intravenous Given 12/16/22 1056)    Mobility walks     Focused Assessments Neuro Assessment Handoff:  Swallow screen pass? No  Neuro Assessment: Exceptions to WDL Neuro Checks:      Has TPA been given? No If patient is a Neuro Trauma and patient is going to OR before floor call report to 4N Charge nurse: [phone] or [phone]   R Recommendations: See Admitting Provider Note  Report given to:   Additional Notes:

## 2022-12-16 NOTE — Hospital Course (Signed)
Patient is a 82 yo female with PMH significant for HTN, HLD, GERD, migraines, hypothyroidism, and afib s/p ablation (on Eliquis).

## 2022-12-17 DIAGNOSIS — G9340 Encephalopathy, unspecified: Secondary | ICD-10-CM

## 2022-12-17 DIAGNOSIS — G9341 Metabolic encephalopathy: Secondary | ICD-10-CM

## 2022-12-17 DIAGNOSIS — N3000 Acute cystitis without hematuria: Secondary | ICD-10-CM

## 2022-12-17 DIAGNOSIS — R6520 Severe sepsis without septic shock: Secondary | ICD-10-CM | POA: Diagnosis not present

## 2022-12-17 DIAGNOSIS — A419 Sepsis, unspecified organism: Secondary | ICD-10-CM

## 2022-12-17 HISTORY — DX: Metabolic encephalopathy: G93.41

## 2022-12-17 HISTORY — DX: Acute cystitis without hematuria: N30.00

## 2022-12-17 LAB — BASIC METABOLIC PANEL
Anion gap: 11 (ref 5–15)
BUN: 12 mg/dL (ref 8–23)
CO2: 22 mmol/L (ref 22–32)
Calcium: 9.1 mg/dL (ref 8.9–10.3)
Chloride: 110 mmol/L (ref 98–111)
Creatinine, Ser: 1.19 mg/dL — ABNORMAL HIGH (ref 0.44–1.00)
GFR, Estimated: 46 mL/min — ABNORMAL LOW (ref >60)
Glucose, Bld: 123 mg/dL — ABNORMAL HIGH (ref 70–99)
Potassium: 3.6 mmol/L (ref 3.5–5.1)
Sodium: 143 mmol/L (ref 135–145)

## 2022-12-17 LAB — CBC
HCT: 43.4 % (ref 36.0–46.0)
Hemoglobin: 13.8 g/dL (ref 12.0–15.0)
MCH: 28 pg (ref 26.0–34.0)
MCHC: 31.8 g/dL (ref 30.0–36.0)
MCV: 88 fL (ref 80.0–100.0)
Platelets: 181 10*3/uL (ref 150–400)
RBC: 4.93 MIL/uL (ref 3.87–5.11)
RDW: 14.3 % (ref 11.5–15.5)
WBC: 9.1 10*3/uL (ref 4.0–10.5)
nRBC: 0 % (ref 0.0–0.2)

## 2022-12-17 MED ORDER — LACTATED RINGERS IV SOLN: INTRAVENOUS | Status: AC

## 2022-12-17 MED ORDER — DILTIAZEM HCL-DEXTROSE 125-5 MG/125ML-% IV SOLN (PREMIX)
5.0000 mg/h | INTRAVENOUS | Status: DC
  Administered 2022-12-17: 15 mg/h via INTRAVENOUS
  Administered 2022-12-17: 5 mg/h via INTRAVENOUS
  Administered 2022-12-18 (×3): 15 mg/h via INTRAVENOUS
  Filled 2022-12-17 (×4): qty 125

## 2022-12-17 MED ORDER — ENOXAPARIN SODIUM 80 MG/0.8ML IJ SOSY
70.0000 mg | PREFILLED_SYRINGE | INTRAMUSCULAR | Status: DC
  Administered 2022-12-18 – 2022-12-19 (×2): 70 mg via SUBCUTANEOUS
  Filled 2022-12-17 (×2): qty 0.8

## 2022-12-17 MED ORDER — TRIMETHOBENZAMIDE HCL 100 MG/ML IM SOLN
200.0000 mg | Freq: Once | INTRAMUSCULAR | Status: AC
  Administered 2022-12-17: 200 mg via INTRAMUSCULAR
  Filled 2022-12-17: qty 2

## 2022-12-17 MED ORDER — LACTATED RINGERS IV BOLUS
1000.0000 mL | Freq: Once | INTRAVENOUS | Status: AC
  Administered 2022-12-17: 1000 mL via INTRAVENOUS

## 2022-12-17 MED ORDER — ENOXAPARIN SODIUM 40 MG/0.4ML IJ SOSY
40.0000 mg | PREFILLED_SYRINGE | Freq: Once | INTRAMUSCULAR | Status: AC
  Administered 2022-12-17: 40 mg via SUBCUTANEOUS
  Filled 2022-12-17: qty 0.4

## 2022-12-17 MED ORDER — DILTIAZEM LOAD VIA INFUSION
15.0000 mg | Freq: Once | INTRAVENOUS | Status: AC
  Administered 2022-12-17: 15 mg via INTRAVENOUS
  Filled 2022-12-17: qty 15

## 2022-12-17 NOTE — Progress Notes (Signed)
OT Cancellation Note  Patient Details Name: Bridget [name] MRN: [id number] DOB: May 17, 1940   Cancelled Treatment:    Reason Eval/Treat Not Completed: Medical issues which prohibited therapy Red MEWS HR 130 at rest and BP elevated; unresponsive per notes.   [name], OTR/L [facility] Office: [phone]   [name] 12/17/2022, 11:51 AM

## 2022-12-17 NOTE — Progress Notes (Signed)
HD#1 SUBJECTIVE:  Patient Summary: Bridget [name] is a 82 y.o. female with a pertinent PMH of Afib s/p ablation (2021), HTN, HLD, GERD, migraines, and hypothyroidism who presents after being found unresponsive this morning and is admitted for acute encephalopathy possibly secondary to a UTI.  Overnight Events: Remained tachycardic overnight. BP stable. EKG showed sinus tachycardia vs. Junction rhythm. Given 1L fluids.   Interim History: Patient was evaluated at bedside. Daughter was present. Says she woke up last night and threw up and was able to recognize her. She has thrown up a few times this morning, but is otherwise unchanged.   OBJECTIVE:  Vital Signs: Vitals:   12/17/22 0006 12/17/22 0200 12/17/22 0330 12/17/22 0742  BP: (!) 127/92 (!) 120/98 (!) 149/107 (!) 144/106  Pulse: (!) 128 (!) 125 (!) 125 (!) 130  Resp: 20 18 20 20   Temp: 98.1 F (36.7 C) 98 F (36.7 C) 98.3 F (36.8 C) 98.8 F (37.1 C)  TempSrc: Oral Oral Oral Oral  SpO2: 99% 99% 99% 99%  Weight:      Height:       Supplemental O2: Room Air SpO2: 99 %  Filed Weights   12/16/22 1039  Weight: 69.9 kg     Intake/Output Summary (Last 24 hours) at 12/17/2022 1103 Last data filed at 12/16/2022 2113 Gross per 24 hour  Intake 1100 ml  Output 400 ml  Net 700 ml   Net IO Since Admission: 700 mL [12/17/22 1103]  Physical Exam:  General: lethargic, non-responsive to verbal commands, eyes closed, withdraws to pain Cardiac: tachycardic, regular rhythm, no m/r/g Pulmonary: CTA bilaterally, tachypneic, no accessory muscle use Abdomen: soft, non-distended Skin: warm and dry Neuro: encephalopathic, responds to pain  Patient Lines/Drains/Airways Status     Active Line/Drains/Airways     Name Placement date Placement time Site Days   Peripheral IV 12/16/22 20 G Right Antecubital 12/16/22  1047  Antecubital  1   Peripheral IV 12/16/22 18 G Anterior;Left;Proximal Forearm 12/16/22  1055  Forearm  1   External  Urinary Catheter 12/16/22  1640  --  1            Pertinent Labs:    Latest Ref Rng & Units 12/16/2022   10:57 AM 12/02/2021    4:17 PM 06/06/2021    2:10 PM  CBC  WBC 4.0 - 10.5 K/uL 7.5  6.2  10.6   Hemoglobin 12.0 - 15.0 g/dL 02.7  25.3  66.4   Hematocrit 36.0 - 46.0 % 48.1  40.4  40.0   Platelets 150 - 400 K/uL 178  168  166        Latest Ref Rng & Units 12/16/2022   10:57 AM 12/02/2021    4:17 PM 06/06/2021    2:10 PM  CMP  Glucose 70 - 99 mg/dL 403  474  259   BUN 8 - 23 mg/dL 20  12  21    Creatinine 0.44 - 1.00 mg/dL 5.63  8.75  6.43   Sodium 135 - 145 mmol/L 138  147  144   Potassium 3.5 - 5.1 mmol/L 4.0  4.2  4.8   Chloride 98 - 111 mmol/L 105  107  107   CO2 22 - 32 mmol/L 20  24  25    Calcium 8.9 - 10.3 mg/dL 9.6  9.8  32.9   Total Protein 6.5 - 8.1 g/dL 6.9  6.5    Total Bilirubin 0.3 - 1.2 mg/dL 0.4  0.4  Alkaline Phos 38 - 126 U/L 79  77    AST 15 - 41 U/L 22  16    ALT 0 - 44 U/L 18  9      Recent Labs    12/16/22 1047  GLUCAP 156*     Pertinent Imaging: MR BRAIN WO CONTRAST  Result Date: 12/16/2022 CLINICAL DATA:  Neuro deficit, acute, stroke suspected. Mental status change. EXAM: MRI HEAD WITHOUT CONTRAST TECHNIQUE: Multiplanar, multiecho pulse sequences of the brain and surrounding structures were obtained without intravenous contrast. COMPARISON:  Head CT earlier same day.  MRI 06/03/2020. FINDINGS: Brain: Diffusion imaging does not show any acute or subacute infarction or other cause of restricted diffusion. Brain shows generalized age related volume loss without subjective lobar predominance. No focal abnormality affects the brainstem or cerebellum. Cerebral hemispheres show moderate chronic small-vessel ischemic changes of the white matter. No cortical or large vessel territory infarction. No mass lesion, hemorrhage, hydrocephalus or extra-axial collection. Vascular: Major vessels at the base of the brain show flow. Skull and upper cervical spine:  Negative Sinuses/Orbits: Clear/normal Other: None IMPRESSION: No acute or reversible finding. Generalized age related volume loss. Moderate chronic small-vessel ischemic changes of the cerebral hemispheric white matter. Electronically Signed   By: Paulina Fusi M.D.   On: 12/16/2022 16:10   CT Head Wo Contrast  Result Date: 12/16/2022 CLINICAL DATA:  Provided history: Mental status change, unknown cause. EXAM: CT HEAD WITHOUT CONTRAST TECHNIQUE: Contiguous axial images were obtained from the base of the skull through the vertex without intravenous contrast. RADIATION DOSE REDUCTION: This exam was performed according to the departmental dose-optimization program which includes automated exposure control, adjustment of the mA and/or kV according to patient size and/or use of iterative reconstruction technique. COMPARISON:  Brain MRI 06/03/2020. FINDINGS: Brain: Generalized cerebral atrophy. Patchy and ill-defined hypoattenuation within the cerebral white matter, nonspecific but compatible with mild chronic small vessel ischemic disease. There is no acute intracranial hemorrhage. No demarcated cortical infarct. No extra-axial fluid collection. No evidence of an intracranial mass. No midline shift. Vascular: No hyperdense vessel.  Atherosclerotic calcifications. Skull: No calvarial fracture or aggressive osseous lesion. Sinuses/Orbits: No mass or acute finding within the imaged orbits. No significant paranasal sinus disease at the imaged levels. IMPRESSION: 1. No evidence of an acute intracranial abnormality. 2. Parenchymal atrophy and mild cerebral white matter chronic small vessel ischemic disease. Electronically Signed   By: Jackey Loge D.O.   On: 12/16/2022 12:41   DG Chest Portable 1 View  Result Date: 12/16/2022 CLINICAL DATA:  Altered mental status. EXAM: PORTABLE CHEST 1 VIEW COMPARISON:  None Available. FINDINGS: Normal mediastinum and cardiac silhouette. Normal pulmonary vasculature. No evidence of  effusion, infiltrate, or pneumothorax. No acute bony abnormality. IMPRESSION: No acute cardiopulmonary process. Electronically Signed   By: Genevive Bi M.D.   On: 12/16/2022 11:22    ASSESSMENT/PLAN:  Assessment: Principal Problem:   Acute encephalopathy  Plan: Acute encephalopathy Sepsis, possibly secondary to urinary tract infection Clinically unchanged from yesterday other than a few episodes of emesis. Still tachycardic, afebrile, slightly hypertensive. Hypothermia resolved. Unresponsive other than withdrawing to pain. Still maintaining her airway. Urine cultures growing gram negative rods. No growth on blood cultures so far.  -Continue ceftriaxone 2 g -Added Tigan for nausea given prolonged QT -Repeat EKG -F/u urine, blood cultures -Continuous LR fluids, 75cc hr for 12 hrs -NPO -Trend CBC, BMP   History of Atrial fibrillation s/p ablation History of several cardioversions and an ablation in 2021.  Tachycardic in 120s-130s. EKG overnight showed sinus tachycardia vs. Junctional tachycardia. Hemodynamically stable. On Eliquis 5 mg BID, diltiazem 240 mg daily, held given inability to tolerate PO. Her tachycardia could be compensatory given sepsis, so will hold off on rate control for now.  -Holding oral medications  -Lovenox for anticoagulation   Chronic conditions: home meds held for now since she is obtunded and unable to take PO meds Hypertension- BP is WNL. On irbesartan 75 mg daily, diltiazem 240 mg daily. Hypothyroidism- TSH 1.1. On synthroid 75 mcg daily.  Memory loss- Follows with neurology. On memantine 5 mg BID. Hyperlipidemia- on crestor 20 mg daily Depression- on Zoloft 25 mg daily LE edema- Lasix 40 mg prn for swelling Chronic back pain/neuropathy- baclofen 10 mg daily Nausea- promethazine 25 mg daily Potassium 20 meq when lasix is taken Migraines- Tramadol 50 mg q4h as needed  Best Practice: Diet: NPO IVF: Fluids: LR, Rate: 75cc/hr VTE: enoxaparin (LOVENOX)  injection 30 mg Start: 12/16/22 1800 Code: Full AB: Ceftriaxone Family Contact: daughter, at bedside. DISPO: Anticipated discharge in 2-5 days pending medical treatment.  Signature: Annett Fabian, MD  Internal Medicine Resident, PGY-1 [facility]  Pager: [phone] 11:03 AM, 12/17/2022   Please contact the on call pager after 5 pm and on weekends at [phone].

## 2022-12-17 NOTE — Progress Notes (Signed)
PT Cancellation Note  Patient Details Name: Bridget [name] MRN: [id number] DOB: 06/20/1940   Cancelled Treatment:    Reason Eval/Treat Not Completed: Medical issues which prohibited therapy  Noting pt with red MEWS.  HR 130's at rest, BP elevated 144/106, and unresponsive per notes.  Will hold PT eval today.  Anise Salvo, PT Acute Rehab [facility] [phone]  Rayetta Humphrey 12/17/2022, 11:24 AM

## 2022-12-17 NOTE — Progress Notes (Signed)
Patients' daughter asked to speak to a provider so I went to address her concerns.She had questions about what antibiotic patients was on and what might be causing her nausea.I explained the current to her. Patient was grateful for the encounter. All concerns addressed.

## 2022-12-17 NOTE — Plan of Care (Signed)

## 2022-12-17 NOTE — Progress Notes (Addendum)
ANTICOAGULATION CONSULT NOTE   Pharmacy Consult for lovenox Indication: atrial fibrillation  Allergies  Allergen Reactions   Codeine Anaphylaxis    Other Reaction(s): GI Intolerance   Sulfonamide Derivatives Anaphylaxis   Triptans Other (See Comments)    Stopped blood supply to mesenteric artery per pt  Other Reaction(s): Other (See Comments)   Amlodipine Other (See Comments)    swelling   Chocolate Other (See Comments)    Headache   Onion Other (See Comments)    Headache   Other     MSG- headaches    Patient Measurements: Height: 5\' 7"  (170.2 cm) Weight: 69.9 kg (154 lb 1.6 oz) IBW/kg (Calculated) : 61.6   Vital Signs: Temp: 98.7 F (37.1 C) (09/29 1713) Temp Source: Axillary (09/29 1713) BP: 117/80 (09/29 1630) Pulse Rate: 131 (09/29 1600)  Labs: Recent Labs    12/16/22 1057 12/17/22 1157  HGB 14.7 13.8  HCT 48.1* 43.4  PLT 178 181  CREATININE 1.71* 1.19*    Estimated Creatinine Clearance: 36.1 mL/min (A) (by C-G formula based on SCr of 1.19 mg/dL (H)).   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    Unknown   Dextran 70-Hypromellose (ARTIFICIAL TEARS PF OP) Place 1 drop into both eyes daily as needed (For dry eyes).   Past Week    diltiazem (CARDIZEM CD) 240 MG 24 hr capsule TAKE 1 CAPSULE BY MOUTH EVERY DAY 90 capsule 3 12/15/2022   diltiazem (CARDIZEM) 30 MG tablet Take 2 tablets by mouth every 6 hours as needed for palpitations. 180 tablet 1 Unknown   ELIQUIS 5 MG TABS tablet TAKE 1 TABLET BY MOUTH TWICE A DAY 180 tablet 1 12/15/2022 at 2000   irbesartan (AVAPRO) 75 MG tablet Take 1 tablet (75 mg total) by mouth daily. 90 tablet 1 12/15/2022   levothyroxine (SYNTHROID, LEVOTHROID) 75 MCG tablet Take 75 mcg by mouth daily before breakfast.   12/16/2022   memantine (NAMENDA) 5 MG tablet Take 5 mg by mouth 2 (two) times daily.   12/15/2022   rosuvastatin (CRESTOR) 20 MG tablet Take 1 tablet (20 mg total) by mouth daily. 90 tablet 0 12/15/2022   sertraline (ZOLOFT) 25 MG tablet Take 25 mg by mouth daily.   12/15/2022   traMADol (ULTRAM) 50 MG tablet Take 50-100 mg by mouth every 4 (four) hours as needed for moderate pain (as needed for migraine pain and neuropathy.).    Past Week   furosemide (LASIX) 20 MG tablet Take 2 tablets (40 mg total) by mouth daily as needed for edema (weight gain and/or swelling). (Patient not taking: Reported on 12/16/2022) 180 tablet 3 Not Taking   potassium chloride SA (KLOR-CON M20) 20 MEQ tablet  TAKE 1 TABLET (20 MEQ TOTAL) BY MOUTH AS NEEDED (TAKE ON DAYS LASIX IS TAKEN). (Patient not taking: Reported on 12/16/2022) 90 tablet 2 Not Taking   promethazine (PHENERGAN) 25 MG tablet Take 25 mg by mouth every 6 (six) hours as needed for nausea. (Patient not taking: Reported on 12/16/2022)   Not Taking   Scheduled:   Assessment: 82 yo female with acute encephalopathy not abel to take po meds. She was on apixban PTA for afib and pharmacy consulted to dose lovenox since she is NPO.  -last dose of apixaban 12/15/22 in the evening (prior to admission) -lovenox 30mg  sq given 9/29 ~ 5pm -SCr 1.7>> 1.19  (1.1-1.4 in 2023); CrCl ~ 35  Patient has borderline renal function for dose reduction. SCr trend down but  unclear what her new baseline will be.   Goal of Therapy:  Monitor platelets by anticoagulation protocol: Yes   Plan:  -Lovenox 40mg  Hand now (for a total dose on 70mg ) then 70mg  [place] q24h in the evening -Follow renal function in the morning and will reassess   Harland German, PharmD Clinical Pharmacist **Pharmacist phone directory can now be found on [email or web address] ([email or web address]).  Listed under [facility].

## 2022-12-18 DIAGNOSIS — G9340 Encephalopathy, unspecified: Secondary | ICD-10-CM

## 2022-12-18 DIAGNOSIS — A419 Sepsis, unspecified organism: Secondary | ICD-10-CM | POA: Diagnosis not present

## 2022-12-18 LAB — CBC
HCT: 42.3 % (ref 36.0–46.0)
Hemoglobin: 13.5 g/dL (ref 12.0–15.0)
MCH: 28.6 pg (ref 26.0–34.0)
MCHC: 31.9 g/dL (ref 30.0–36.0)
MCV: 89.6 fL (ref 80.0–100.0)
Platelets: 159 10*3/uL (ref 150–400)
RBC: 4.72 MIL/uL (ref 3.87–5.11)
RDW: 14.1 % (ref 11.5–15.5)
WBC: 8.9 10*3/uL (ref 4.0–10.5)
nRBC: 0 % (ref 0.0–0.2)

## 2022-12-18 LAB — URINE CULTURE: Culture: >=100000 — AB

## 2022-12-18 LAB — BASIC METABOLIC PANEL
Anion gap: 8 (ref 5–15)
BUN: 11 mg/dL (ref 8–23)
CO2: 20 mmol/L — ABNORMAL LOW (ref 22–32)
Calcium: 8.9 mg/dL (ref 8.9–10.3)
Chloride: 111 mmol/L (ref 98–111)
Creatinine, Ser: 1.33 mg/dL — ABNORMAL HIGH (ref 0.44–1.00)
GFR, Estimated: 40 mL/min — ABNORMAL LOW (ref >60)
Glucose, Bld: 126 mg/dL — ABNORMAL HIGH (ref 70–99)
Potassium: 3.3 mmol/L — ABNORMAL LOW (ref 3.5–5.1)
Sodium: 139 mmol/L (ref 135–145)

## 2022-12-18 LAB — MAGNESIUM: Magnesium: 1.9 mg/dL (ref 1.7–2.4)

## 2022-12-18 MED ORDER — LEVOTHYROXINE SODIUM 75 MCG PO TABS
75.0000 ug | ORAL_TABLET | Freq: Every day | ORAL | Status: DC
  Administered 2022-12-19 – 2022-12-21 (×3): 75 ug via ORAL
  Filled 2022-12-18 (×3): qty 1

## 2022-12-18 MED ORDER — SERTRALINE HCL 50 MG PO TABS
25.0000 mg | ORAL_TABLET | Freq: Every day | ORAL | Status: DC
  Administered 2022-12-18 – 2022-12-21 (×4): 25 mg via ORAL
  Filled 2022-12-18 (×4): qty 1

## 2022-12-18 MED ORDER — TRAMADOL HCL 50 MG PO TABS
50.0000 mg | ORAL_TABLET | Freq: Once | ORAL | Status: AC
  Administered 2022-12-18: 50 mg via ORAL
  Filled 2022-12-18: qty 1

## 2022-12-18 MED ORDER — AMIODARONE HCL IN DEXTROSE 360-4.14 MG/200ML-% IV SOLN
30.0000 mg/h | INTRAVENOUS | Status: DC
  Administered 2022-12-18 – 2022-12-19 (×3): 30 mg/h via INTRAVENOUS
  Filled 2022-12-18 (×4): qty 200

## 2022-12-18 MED ORDER — AMIODARONE LOAD VIA INFUSION
150.0000 mg | Freq: Once | INTRAVENOUS | Status: AC
  Administered 2022-12-18: 150 mg via INTRAVENOUS
  Filled 2022-12-18: qty 83.34

## 2022-12-18 MED ORDER — ADENOSINE 6 MG/2ML IV SOLN
INTRAVENOUS | Status: AC
  Filled 2022-12-18: qty 2

## 2022-12-18 MED ORDER — AMIODARONE HCL IN DEXTROSE 360-4.14 MG/200ML-% IV SOLN
60.0000 mg/h | INTRAVENOUS | Status: DC
  Administered 2022-12-18 (×2): 60 mg/h via INTRAVENOUS
  Filled 2022-12-18: qty 200

## 2022-12-18 MED ORDER — LACTATED RINGERS IV SOLN: INTRAVENOUS | Status: DC

## 2022-12-18 MED ORDER — LACTATED RINGERS IV SOLN: INTRAVENOUS | Status: AC

## 2022-12-18 MED ORDER — DILTIAZEM HCL ER COATED BEADS 240 MG PO CP24
240.0000 mg | ORAL_CAPSULE | Freq: Every day | ORAL | Status: DC
  Administered 2022-12-18 – 2022-12-21 (×4): 240 mg via ORAL
  Filled 2022-12-18 (×4): qty 1

## 2022-12-18 MED ORDER — ADENOSINE 6 MG/2ML IV SOLN: 6.0000 mg | Freq: Once | INTRAVENOUS | Status: DC

## 2022-12-18 MED ORDER — ROSUVASTATIN CALCIUM 20 MG PO TABS
20.0000 mg | ORAL_TABLET | Freq: Every day | ORAL | Status: DC
  Administered 2022-12-18 – 2022-12-21 (×4): 20 mg via ORAL
  Filled 2022-12-18 (×4): qty 1

## 2022-12-18 MED ORDER — ACETAMINOPHEN 325 MG PO TABS
650.0000 mg | ORAL_TABLET | Freq: Four times a day (QID) | ORAL | Status: DC | PRN
  Administered 2022-12-18 – 2022-12-20 (×2): 650 mg via ORAL
  Filled 2022-12-18 (×2): qty 2

## 2022-12-18 NOTE — Progress Notes (Signed)
Inpatient Rehab Admissions Coordinator:   Per therapy recommendations, patient was screened for CIR candidacy by Bethann Qualley, MS, CCC-SLP. At this time, Pt. does not appear to demonstrate medical necessity to justify in hospital rehabilitation/CIR. will not pursue a rehab consult for this Pt.   Recommend other rehab venues to be pursued.  Please contact me with any questions.  Kerrilyn Azbill, MS, CCC-SLP Rehab Admissions Coordinator  [phone] (celll) [phone] (office)   

## 2022-12-18 NOTE — Progress Notes (Addendum)
HD#2 SUBJECTIVE:  Patient Summary: Bridget [name] is a 82 y.o. female with a pertinent PMH of Afib s/p ablation (2021), HTN, HLD, GERD, migraines, and hypothyroidism who presents after being found unresponsive this morning and is admitted for acute encephalopathy possibly secondary to a UTI.  Overnight Events: Remained tachycardic on diltiazem in 140s, HDS. Sinus tachycardia. Carotid massage unsuccessful. Cards consulted, recommended adenosine. Patient went into Afib with RVR, started on IV amiodarone.   Interim History: Patient was evaluated at bedside. Daughter was present. She was much improved, alert with eyes open, verbally responsive. No concerns at this time.   OBJECTIVE:  Vital Signs: Vitals:   12/18/22 0356 12/18/22 0701 12/18/22 0729 12/18/22 1130  BP: 123/89 127/89 118/85 114/77  Pulse:  (!) 118 (!) 121 82  Resp:   16 16  Temp: 100 F (37.8 C) 98.8 F (37.1 C) 99.2 F (37.3 C) 98.8 F (37.1 C)  TempSrc: Axillary Oral Axillary Axillary  SpO2:  98% 95% 96%  Weight:      Height:       Supplemental O2: Room Air SpO2: 96 %  Filed Weights   12/16/22 1039  Weight: 69.9 kg     Intake/Output Summary (Last 24 hours) at 12/18/2022 1343 Last data filed at 12/18/2022 1100 Gross per 24 hour  Intake 1120.42 ml  Output 850 ml  Net 270.42 ml   Net IO Since Admission: 970.42 mL [12/18/22 1343]  Physical Exam:  General: lethargic, alert and responsive, talking, significantly improved from yesterday Cardiac: tachycardic, Afib, no m/r/g Pulmonary: CTA bilaterally, regular rate/effort, no accessory muscle use Abdomen: soft, non-distended Skin: warm and dry Neuro: Alert, responding appropriately, not oriented to situation  Patient Lines/Drains/Airways Status     Active Line/Drains/Airways     Name Placement date Placement time Site Days   Peripheral IV 12/16/22 20 G Right Antecubital 12/16/22  1047  Antecubital  1   Peripheral IV 12/16/22 18 G Anterior;Left;Proximal  Forearm 12/16/22  1055  Forearm  1   External Urinary Catheter 12/16/22  1640  --  1            Pertinent Labs:    Latest Ref Rng & Units 12/18/2022    7:33 AM 12/17/2022   11:57 AM 12/16/2022   10:57 AM  CBC  WBC 4.0 - 10.5 K/uL 8.9  9.1  7.5   Hemoglobin 12.0 - 15.0 g/dL 16.1  09.6  04.5   Hematocrit 36.0 - 46.0 % 42.3  43.4  48.1   Platelets 150 - 400 K/uL 159  181  178        Latest Ref Rng & Units 12/18/2022    7:33 AM 12/17/2022   11:57 AM 12/16/2022   10:57 AM  CMP  Glucose 70 - 99 mg/dL 409  811  914   BUN 8 - 23 mg/dL 11  12  20    Creatinine 0.44 - 1.00 mg/dL 7.82  9.56  2.13   Sodium 135 - 145 mmol/L 139  143  138   Potassium 3.5 - 5.1 mmol/L 3.3  3.6  4.0   Chloride 98 - 111 mmol/L 111  110  105   CO2 22 - 32 mmol/L 20  22  20    Calcium 8.9 - 10.3 mg/dL 8.9  9.1  9.6   Total Protein 6.5 - 8.1 g/dL   6.9   Total Bilirubin 0.3 - 1.2 mg/dL   0.4   Alkaline Phos 38 - 126 U/L   79  AST 15 - 41 U/L   22   ALT 0 - 44 U/L   18     Recent Labs    12/16/22 1047  GLUCAP 156*     ASSESSMENT/PLAN:  Assessment: Principal Problem:   Acute encephalopathy Active Problems:   Acute cystitis without hematuria   Sepsis with encephalopathy without septic shock (HCC)  Plan: Acute encephalopathy Sepsis, possibly secondary to urinary tract infection Significantly improved from yesterday. HR <100 this morning, BP stable. Patient is alert and engaged, verbally responsive. Urine cultures growing klebsiella pneumoniae, susceptible to ceftriaxone, which she has been on for 2 days. No growth on blood cultures so far.  -Continue ceftriaxone 2 g -Continuous cardiac monitoring -LR fluids, 75cc hr for 12 hrs -Started diet -Trend CBC, BMP   History of Atrial fibrillation s/p ablation SVT s/p adenosine, now in Afib on amiodarone History of several cardioversions and an ablation in 2021. Tachycardia persistent, so started on diltiazem drip yesterday. HR refractory to fluids and  diltiazem. Carotid massage unsuccessful. Cardiology consulted overnight, adenosine given. Patient went into Afib with RVR, started on amiodarone. Hemodynamically stable this morning, still in Afib, rate controlled.  -Lovenox switched to therapeutic dose -On IV diltiazem, amiodarone; will wean IV diltiazem and switch to oral   Chronic conditions: home meds held for now since she is obtunded and unable to take PO meds Hypertension- BP is WNL. Restarted diltiazem 240 mg daily. Holding irbesartan 75 mg as BP wnl. Hypothyroidism- TSH 1.1. On synthroid 75 mcg daily.  Memory loss- Follows with neurology. Holding memantine. Hyperlipidemia- on crestor 20 mg daily Depression- on Zoloft 25 mg daily LE edema- Holding Lasix 40 mg Chronic back pain/neuropathy- Holding baclofen 10 mg daily Nausea- Holding promethazine 25 mg  Potassium 20 meq when lasix is taken Migraines- Holding Tramadol 50 mg q4h as needed  Best Practice: Diet: Healthy heart IVF: Fluids: LR, Rate: 75cc/hr  VTE: Lovenox 70 mg daily Code: Full AB: Ceftriaxone Family Contact: daughter, at bedside. DISPO: Anticipated discharge in 2-5 days pending medical treatment.  Signature: Annett Fabian, MD  Internal Medicine Resident, PGY-1 [facility]  Pager: [phone] 1:43 PM, 12/18/2022   Please contact the on call pager after 5 pm and on weekends at [phone].

## 2022-12-18 NOTE — Progress Notes (Addendum)
Got paged by nursing concerning SVT in the 140s.Patient was seen at bedside.She was laying in bed comfortably and didn't seem to be in acute distress , denied any chest pain or shortness of breath. Patient's daughter at bedside.Her exam remarkable for tachycardia but otherwise stable and asymptomatic at this time. Carotid massage brought the heart rate down but it went back up.IV diltiazem maxed at 15.  Rapid nurse informed. Cardiologist on call Dr Evette Doffing was contacted and she recommended trying IV adenosine. Prior to leaving the patient's room, she converted back to Afib. Pt is started on IV amiodarone.  Jamesetta So Pager# [phone] IMTS

## 2022-12-18 NOTE — Evaluation (Signed)
Physical Therapy Evaluation Patient Details Name: Bridget [name] MRN: [id number] DOB: [birth year] Today's Date: 12/18/2022  History of Present Illness  Bridget [name] is a 82 y.o. female who presents 9/28 after being found unresponsive and is admitted for acute encephalopathy possibly secondary to a UTI. PMH of Afib s/p ablation (2021), HTN, HLD, GERD, migraines, and hypothyroidism.  Clinical Impression  Pt admitted with above diagnosis. Per records pt ind PTA. She was oriented to self, DOB, and location ([facility] in [place]) this afternoon but could not recall month,year, or situation.  Min assist for bed mobility, mod assist for sit to stand transfer with posterior lean. Min assist for short distance gait in room to recliner, leaning posteriorly and having difficulty navigating RW. (Denies dizziness but reports fatigue with position changes, see orthostatics below.) Assisted with peri-care following bowel incontinence in bed.Pt currently with functional limitations due to the deficits listed below (see PT Problem List). Pt will benefit from acute skilled PT to increase their independence and safety with mobility to allow discharge.  Patient will benefit from intensive inpatient follow up therapy, >3 hours/day      12/18/22 1300  Orthostatic Lying   BP- Lying 110/72  Pulse- Lying 86  Orthostatic Sitting  BP- Sitting (!) 86/64  Pulse- Sitting 86  Orthostatic Standing at 0 minutes  BP- Standing at 0 minutes (!) 88/62  Pulse- Standing at 0 minutes 104   After sitting in recliner for a few minutes BP 107/73 HR 91         If plan is discharge home, recommend the following: A little help with bathing/dressing/bathroom;Assistance with cooking/housework;A lot of help with walking and/or transfers;Direct supervision/assist for medications management;Direct supervision/assist for financial management;Assist for transportation;Help with stairs or ramp for entrance;Supervision due to cognitive status   Can  travel by private vehicle        Equipment Recommendations Other (comment) (TBD)  Recommendations for Other Services  Rehab consult    Functional Status Assessment Patient has had a recent decline in their functional status and demonstrates the ability to make significant improvements in function in a reasonable and predictable amount of time.     Precautions / Restrictions Precautions Precautions: Fall Precaution Comments: watch vitals Restrictions Weight Bearing Restrictions: No      Mobility  Bed Mobility Overal bed mobility: Needs Assistance Bed Mobility: Supine to Sit     Supine to sit: Min assist     General bed mobility comments: Min assist for trunk support and to scoot forward to place feet on ground. Cues throughout.    Transfers Overall transfer level: Needs assistance Equipment used: Rolling walker (2 wheels), 1 person hand held assist Transfers: Sit to/from Stand Sit to Stand: Mod assist           General transfer comment: Mod assist for boost and balance with moderate retropulsion. Tactile cues for anterior weight shift. Performed x2, once with HHA and again with RW for support.    Ambulation/Gait Ambulation/Gait assistance: Min assist Gait Distance (Feet): 5 Feet Assistive device: Rolling walker (2 wheels) Gait Pattern/deviations: Step-through pattern, Decreased stride length, Shuffle, Leaning posteriorly, Trunk flexed Gait velocity: decr Gait velocity interpretation: <1.31 ft/sec, indicative of household ambulator   General Gait Details: Tolerated short distance in room to recliner. Min assist for balance and RW control. Poor manipulation ability reduced ability to anticipate RW placement around obstacles. No overt buckling. Denies dizziness but feels fatigue and wanted to sit.  Stairs  Wheelchair Mobility     Tilt Bed    Modified Rankin (Stroke Patients Only)       Balance Overall balance assessment: Needs  assistance Sitting-balance support: Feet supported Sitting balance-Leahy Scale: Poor Sitting balance - Comments: CGA - min A with posterior lean initially Postural control: Posterior lean Standing balance support: During functional activity, Single extremity supported Standing balance-Leahy Scale: Poor Standing balance comment: Mod assist with single UE support, min with bil UE support.                             Pertinent Vitals/Pain Pain Assessment Pain Assessment: No/denies pain Pain Intervention(s): Monitored during session    Home Living Family/patient expects to be discharged to:: Private residence Living Arrangements: Spouse/significant other Available Help at Discharge: Family;Available 24 hours/day Type of Home: House Home Access: Stairs to enter   [employer] of Steps: 1   Home Layout: Able to live on main level with bedroom/bathroom   Additional Comments: pt unable to report; per chart pt lives with husband    Prior Function Prior Level of Function : Patient poor historian/Family not available             Mobility Comments: Pt presents with impaired cog - per chart pt is indep at baseline ADLs Comments: Pt presents with impaired cog - per chart pt is indep at baseline     Extremity/Trunk Assessment   Upper Extremity Assessment Upper Extremity Assessment: Defer to OT evaluation    Lower Extremity Assessment Lower Extremity Assessment: Generalized weakness    Cervical / Trunk Assessment Cervical / Trunk Assessment: Normal  Communication   Communication Communication: No apparent difficulties  Cognition Arousal: Alert Behavior During Therapy: Flat affect Overall Cognitive Status: No family/caregiver present to determine baseline cognitive functioning Area of Impairment: Orientation, Following commands, Awareness                 Orientation Level: Disoriented to, Time, Situation     Following Commands: Follows one  step commands inconsistently       General Comments: Gives name DOB, location ([facility]) and town. Unsure of month, year, situation.        General Comments General comments (skin integrity, edema, etc.): Denies dizziness but felt fatigue with postional changes (see vitals tab) BP returned to 107/73 HR 91 seated in recliner. SpO2 96-98% on RA throughout.    Exercises General Exercises - Lower Extremity Ankle Circles/Pumps: AROM, Both, 10 reps, Seated Quad Sets: Strengthening, Both, 10 reps, Seated Gluteal Sets: Strengthening, Both, 10 reps, Seated   Assessment/Plan    PT Assessment Patient needs continued PT services  PT Problem List Decreased strength;Decreased activity tolerance;Decreased balance;Decreased mobility;Decreased cognition;Decreased knowledge of use of DME;Decreased safety awareness;Decreased knowledge of precautions;Cardiopulmonary status limiting activity       PT Treatment Interventions DME instruction;Gait training;Stair training;Functional mobility training;Therapeutic activities;Therapeutic exercise;Balance training;Neuromuscular re-education;Cognitive remediation;Patient/family education    PT Goals (Current goals can be found in the Care Plan section)  Acute Rehab PT Goals Patient Stated Goal: Husband okay with post acute rehab to get well. PT Goal Formulation: With patient/family Time For Goal Achievement: 01/01/23 Potential to Achieve Goals: Good    Frequency Min 1X/week     Co-evaluation               AM-PAC PT "6 Clicks" Mobility  Outcome Measure Help needed turning from your back to your side while in a flat bed without using bedrails?:  A Little Help needed moving from lying on your back to sitting on the side of a flat bed without using bedrails?: A Little Help needed moving to and from a bed to a chair (including a wheelchair)?: A Lot Help needed standing up from a chair using your arms (e.g., wheelchair or bedside chair)?: A Lot Help needed  to walk in hospital room?: A Lot Help needed climbing 3-5 steps with a railing? : A Lot 6 Click Score: 14    End of Session Equipment Utilized During Treatment: Gait belt Activity Tolerance: Patient tolerated treatment well Patient left: in chair;with call bell/phone within reach;with chair alarm set;with SCD's reapplied Nurse Communication: Mobility status;Other (comment) (VS) PT Visit Diagnosis: Unsteadiness on feet (R26.81);Other abnormalities of gait and mobility (R26.89);Muscle weakness (generalized) (M62.81);Other symptoms and signs involving the nervous system (R29.898)    Time: 1610-9604 PT Time Calculation (min) (ACUTE ONLY): 25 min   Charges:   PT Evaluation $PT Eval Moderate Complexity: 1 Mod PT Treatments $Therapeutic Activity: 8-22 mins PT General Charges $$ ACUTE PT VISIT: 1 Visit         Kathlyn Sacramento, PT, DPT [facility]  Rehabilitation Services Physical Therapist Office: [phone] Website: [email or web address]   Berton Mount 12/18/2022, 3:02 PM

## 2022-12-18 NOTE — Progress Notes (Signed)
PT Cancellation Note  Patient Details Name: Bridget [name] MRN: [id number] DOB: 17-Jun-1940   Cancelled Treatment:    Reason Eval/Treat Not Completed: Other (comment) Pt was working with other staff on my first two attempts this morning. She is asleep currently and husband reports I can follow-up later for PT evaluation.  Will stop by again this afternoon for comprehensive PT evaluation.   Kathlyn Sacramento, PT, DPT [facility]  Rehabilitation Services Physical Therapist Office: [phone] Website: [email or web address]

## 2022-12-18 NOTE — Evaluation (Signed)
Occupational Therapy Evaluation Patient Details Name: Bridget [name] MRN: [id number] DOB: [birth year] Today's Date: 12/18/2022   History of Present Illness Bridget [name] is a 82 y.o. female who presents after being found unresponsive this morning and is admitted for acute encephalopathy possibly secondary to a UTI. PMH of Afib s/p ablation (2021), HTN, HLD, GERD, migraines, and hypothyroidism.   Clinical Impression   Bridget [name] was evaluated s/p the above admission list. Per chart, she lives with her husband and is indep at baseline; pt presents with impaired cognition and was unable to provide accurate information. Upon evaluation the pt was limited by impaired cognition, generalized weakness, posterior bias in sitting and standing, unsteady balance, elevated HR, drop in BP and decreased activity tolerance. Overall she needed mod A to get to the EOB and mod A to stand with maximal cues to initiate/participate in tasks. In standing, she was able to lateral step with minA and RW. Due to the deficits listed below the pt also needs up to max A for LB ADLs and mod A for UB ADLs. Pt will benefit from continued acute OT services and intensive inpatient follow up therapy, >3 hours/day after discharge.   HR upon arrival was 123, resting in 100s most of the session, up to 139 after lateral stepping, back to 100s while supine at the end of the session. Monitor reporting afib  Supine BP 124/88 Sitting BP 107/71 Supine after <2 minutes standing 120/80           Functional Status Assessment  Patient has had a recent decline in their functional status and demonstrates the ability to make significant improvements in function in a reasonable and predictable amount of time.  Equipment Recommendations  Other (comment) (pending acute progress)    Recommendations for Other Services Rehab consult     Precautions / Restrictions Precautions Precautions: Fall Precaution Comments: watch  vitals Restrictions Weight Bearing Restrictions: No      Mobility Bed Mobility Overal bed mobility: Needs Assistance Bed Mobility: Supine to Sit, Sit to Supine     Supine to sit: Mod assist Sit to supine: Max assist   General bed mobility comments: maximal cues    Transfers Overall transfer level: Needs assistance Equipment used: Rolling walker (2 wheels) Transfers: Sit to/from Stand Sit to Stand: Mod assist           General transfer comment: mod A to stand EOB, posterior bias in standing, pt able to take laterall steps with min A for balance      Balance Overall balance assessment: Needs assistance Sitting-balance support: Feet supported Sitting balance-Leahy Scale: Poor Sitting balance - Comments: CGA - mod A   Standing balance support: Bilateral upper extremity supported, During functional activity Standing balance-Leahy Scale: Poor                             ADL either performed or assessed with clinical judgement   ADL Overall ADL's : Needs assistance/impaired Eating/Feeding: NPO   Grooming: Moderate assistance   Upper Body Bathing: Adhering to UE precautions   Lower Body Bathing: Maximal assistance   Upper Body Dressing : Moderate assistance   Lower Body Dressing: Maximal assistance   Toilet Transfer: Maximal assistance   Toileting- Clothing Manipulation and Hygiene: Maximal assistance       Functional mobility during ADLs: Moderate assistance General ADL Comments: limited by impaired cognition, weakness and posterior bias     Vision Baseline Vision/History: 0  No visual deficits Vision Assessment?: No apparent visual deficits     Perception Perception: Not tested       Praxis Praxis: Not tested       Pertinent Vitals/Pain Pain Assessment Pain Assessment: Faces Faces Pain Scale: Hurts a little bit Pain Location: grimace with bed mobility Pain Descriptors / Indicators: Discomfort, Grimacing Pain Intervention(s):  Limited activity within patient's tolerance, Monitored during session     Extremity/Trunk Assessment Upper Extremity Assessment Upper Extremity Assessment: Difficult to assess due to impaired cognition (Seemingly WFL)   Lower Extremity Assessment Lower Extremity Assessment: Defer to PT evaluation   Cervical / Trunk Assessment Cervical / Trunk Assessment: Normal   Communication Communication Communication: No apparent difficulties   Cognition Arousal: Alert Behavior During Therapy: Flat affect Overall Cognitive Status: Impaired/Different from baseline Area of Impairment: Orientation, Following commands, Awareness                 Orientation Level: Disoriented to, Place, Time, Situation, Person     Following Commands: Follows one step commands inconsistently   Awareness: Intellectual   General Comments: Pt able to state her name only, unsure of birthdate. Followed simple cues intermittently, needed maximal multimodal cues to complete bed moility     General Comments  HR 123 upon arrival. resting at 100s, up to 139 after mobility. monitor reading afib. drop in BP after sitting     Home Living Family/patient expects to be discharged to:: Private residence Living Arrangements: Spouse/significant other                               Additional Comments: pt unable to report; per chart pt lives with husband      Prior Functioning/Environment Prior Level of Function : Patient poor historian/Family not available             Mobility Comments: Pt presents with impaired cog - per chart pt is indep at baseline ADLs Comments: Pt presents with impaired cog - per chart pt is indep at baseline        OT Problem List: Decreased strength;Decreased range of motion;Decreased activity tolerance;Impaired balance (sitting and/or standing);Decreased safety awareness;Decreased knowledge of use of DME or AE;Decreased knowledge of precautions      OT  Treatment/Interventions: Self-care/ADL training;Therapeutic exercise;DME and/or AE instruction;Therapeutic activities;Balance training;Patient/family education    OT Goals(Current goals can be found in the care plan section) Acute Rehab OT Goals Patient Stated Goal: unable to state OT Goal Formulation: Patient unable to participate in goal setting Time For Goal Achievement: 01/01/23 Potential to Achieve Goals: Good ADL Goals Pt Will Perform Grooming: with set-up;sitting Pt Will Perform Upper Body Dressing: with set-up;sitting Pt Will Perform Lower Body Dressing: with min assist;sit to/from stand Pt Will Transfer to Toilet: with min assist;ambulating;regular height toilet Additional ADL Goal #1: Pt will follow 75% of simple 1 step commands during functional activity  OT Frequency: Min 1X/week       AM-PAC OT "6 Clicks" Daily Activity     Outcome Measure Help from another person eating meals?: Total Help from another person taking care of personal grooming?: A Lot Help from another person toileting, which includes using toliet, bedpan, or urinal?: A Lot Help from another person bathing (including washing, rinsing, drying)?: A Lot Help from another person to put on and taking off regular upper body clothing?: A Lot Help from another person to put on and taking off regular lower body clothing?: A  Lot 6 Click Score: 11   End of Session Equipment Utilized During Treatment: Gait belt;Rolling walker (2 wheels) Nurse Communication: Mobility status  Activity Tolerance: Patient tolerated treatment well Patient left: in bed;with call bell/phone within reach;with bed alarm set  OT Visit Diagnosis: Unsteadiness on feet (R26.81);Other abnormalities of gait and mobility (R26.89);Muscle weakness (generalized) (M62.81)                Time: 4098-1191 OT Time Calculation (min): 18 min Charges:  OT General Charges $OT Visit: 1 Visit OT Evaluation $OT Eval Moderate Complexity: 1 Mod  Derenda Mis, OTR/L Acute Rehabilitation Services Office [phone] Secure Chat Communication Preferred   Donia Pounds 12/18/2022, 11:04 AM

## 2022-12-18 NOTE — TOC Initial Note (Signed)
Transition of Care [facility]) - Initial/Assessment Note    Patient Details  Name: Bridget [name] MRN: [id number] Date of Birth: Jul 28, 1940  Transition [facility]) CM/SW Contact:    Kermit Balo, RN Phone Number: 12/18/2022, 3:34 PM  Clinical Narrative:                  Patient is from home with her spouse. Per spouse they are together all the time. He says he can provide needed supervision after a rehab stay.  Spouse provides needed transportation.  Her daughter sets up her pill box and her spouse makes sure she takes her medications.  Current recommendations are for CIR. Awaiting eval.  TOC following.  Expected Discharge Plan: IP Rehab Facility Barriers to Discharge: Continued Medical Work up   Patient Goals and CMS Choice   CMS Medicare.gov Compare Post Acute Care list provided to:: Patient Choice offered to / list presented to : Patient, Spouse, Adult Children      Expected Discharge Plan and Services   Discharge Planning Services: CM Consult Post Acute Care Choice: IP Rehab Living arrangements for the past 2 months: Single Family Home                                      Prior Living Arrangements/Services Living arrangements for the past 2 months: Single Family Home Lives with:: Spouse Patient language and need for interpreter reviewed:: Yes Do you feel safe going back to the place where you live?: Yes        Care giver support system in place?: Yes (comment) Current home services: DME Criminal Activity/Legal Involvement Pertinent to Current Situation/Hospitalization: No - Comment as needed  Activities of Daily Living   ADL Screening (condition at time of admission) Does the patient have a NEW difficulty with bathing/dressing/toileting/self-feeding that is expected to last >3 days?: Yes (Initiates electronic notice to provider for possible OT consult) (Pt is lethargic/somnolent) Does the patient have a NEW difficulty with getting in/out of bed, walking,  or climbing stairs that is expected to last >3 days?: Yes (Initiates electronic notice to provider for possible PT consult) (Pt is lethargic/somnolent) Does the patient have a NEW difficulty with communication that is expected to last >3 days?: Yes (Initiates electronic notice to provider for possible SLP consult) (Pt is lethargic/somnolent) Is the patient deaf or have difficulty hearing?: No Does the patient have difficulty seeing, even when wearing glasses/contacts?: No Does the patient have difficulty concentrating, remembering, or making decisions?: No  Permission Sought/Granted                  Emotional Assessment Appearance:: Appears stated age   Affect (typically observed): Quiet Orientation: : Oriented to Self, Oriented to Place   Psych Involvement: No (comment)  Admission diagnosis:  Acute cystitis without hematuria [N30.00] Acute encephalopathy [G93.40] Sepsis with encephalopathy without septic shock, due to unspecified organism (HCC) [A41.9, R65.20, G93.41] Patient Active Problem List   Diagnosis Date Noted   Acute cystitis without hematuria 12/17/2022   Sepsis with encephalopathy without septic shock (HCC) 12/17/2022   Acute encephalopathy 12/16/2022   Nonischemic cardiomyopathy (HCC)    Persistent atrial fibrillation (HCC) 07/10/2019   Back pain 04/18/2015   Lumbar herniated disc 11/13/2014   Peripheral polyneuropathy 07/30/2013   Migraine headache 07/30/2013   Chronic neck pain 07/10/2013   ADENOMATOUS COLONIC POLYP 11/12/2008   Hypothyroidism 08/22/2007   Lumbar  radiculopathy 08/22/2007   Disorder of joint 08/22/2007   Arthropathia 08/22/2007   Hyperlipidemia 08/21/2007   Hypertension 08/21/2007   GERD (gastroesophageal reflux disease) 08/21/2007   Diverticulosis of large intestine 08/21/2007   Headache 08/21/2007   History of colonic polyps 08/21/2007   PCP:  Marylen Ponto, MD Pharmacy:   [facility] [address], [place] - [address] [place]  [address] [place] [address] Phone: [phone] Fax: [phone]     Social Determinants of Health (SDOH) Social History: SDOH Screenings   Social Connections: Unknown (07/31/2021)   Received from [facility]  Tobacco Use: Low Risk  (11/24/2022)   SDOH Interventions:     Readmission Risk Interventions     No data to display

## 2022-12-19 ENCOUNTER — Encounter (HOSPITAL_COMMUNITY): Payer: Self-pay | Admitting: Infectious Diseases

## 2022-12-19 DIAGNOSIS — G9340 Encephalopathy, unspecified: Secondary | ICD-10-CM | POA: Diagnosis not present

## 2022-12-19 DIAGNOSIS — A419 Sepsis, unspecified organism: Secondary | ICD-10-CM | POA: Diagnosis not present

## 2022-12-19 LAB — BASIC METABOLIC PANEL
Anion gap: 7 (ref 5–15)
BUN: 13 mg/dL (ref 8–23)
CO2: 21 mmol/L — ABNORMAL LOW (ref 22–32)
Calcium: 8.7 mg/dL — ABNORMAL LOW (ref 8.9–10.3)
Chloride: 111 mmol/L (ref 98–111)
Creatinine, Ser: 1.35 mg/dL — ABNORMAL HIGH (ref 0.44–1.00)
GFR, Estimated: 39 mL/min — ABNORMAL LOW (ref >60)
Glucose, Bld: 92 mg/dL (ref 70–99)
Potassium: 3 mmol/L — ABNORMAL LOW (ref 3.5–5.1)
Sodium: 139 mmol/L (ref 135–145)

## 2022-12-19 LAB — CBC
HCT: 39.5 % (ref 36.0–46.0)
Hemoglobin: 12.6 g/dL (ref 12.0–15.0)
MCH: 27.6 pg (ref 26.0–34.0)
MCHC: 31.9 g/dL (ref 30.0–36.0)
MCV: 86.6 fL (ref 80.0–100.0)
Platelets: 153 10*3/uL (ref 150–400)
RBC: 4.56 MIL/uL (ref 3.87–5.11)
RDW: 14.3 % (ref 11.5–15.5)
WBC: 7.5 10*3/uL (ref 4.0–10.5)
nRBC: 0 % (ref 0.0–0.2)

## 2022-12-19 LAB — CORTISOL-AM, BLOOD: Cortisol - AM: 9.5 ug/dL (ref 6.7–22.6)

## 2022-12-19 LAB — MAGNESIUM: Magnesium: 2 mg/dL (ref 1.7–2.4)

## 2022-12-19 MED ORDER — AMIODARONE HCL 200 MG PO TABS
200.0000 mg | ORAL_TABLET | Freq: Every day | ORAL | Status: DC
  Administered 2022-12-19 – 2022-12-21 (×3): 200 mg via ORAL
  Filled 2022-12-19 (×3): qty 1

## 2022-12-19 MED ORDER — CEFADROXIL 500 MG PO CAPS
500.0000 mg | ORAL_CAPSULE | Freq: Two times a day (BID) | ORAL | Status: AC
  Administered 2022-12-19 – 2022-12-20 (×4): 500 mg via ORAL
  Filled 2022-12-19 (×4): qty 1

## 2022-12-19 MED ORDER — POTASSIUM CHLORIDE CRYS ER 20 MEQ PO TBCR
30.0000 meq | EXTENDED_RELEASE_TABLET | Freq: Two times a day (BID) | ORAL | Status: DC
  Administered 2022-12-19 – 2022-12-21 (×5): 30 meq via ORAL
  Filled 2022-12-19 (×5): qty 1

## 2022-12-19 MED ORDER — TRAMADOL HCL 50 MG PO TABS
50.0000 mg | ORAL_TABLET | Freq: Every day | ORAL | Status: DC | PRN
  Administered 2022-12-19 – 2022-12-20 (×2): 50 mg via ORAL
  Filled 2022-12-19 (×2): qty 1

## 2022-12-19 NOTE — Plan of Care (Signed)

## 2022-12-19 NOTE — Care Management Important Message (Signed)
Important Message  Patient Details  Name: Bridget [name] MRN: [id number] Date of Birth: 1940/07/14   Important Message Given:  Yes - Medicare IM     Dorena Bodo 12/19/2022, 3:39 PM

## 2022-12-19 NOTE — Progress Notes (Signed)
HD#3 SUBJECTIVE:  Patient Summary: Bridget [name] is a 82 y.o. female with a pertinent PMH of Afib s/p ablation (2021), HTN, HLD, GERD, migraines, and hypothyroidism who presents after being found unresponsive this morning and is admitted for acute encephalopathy possibly secondary to a UTI.  Overnight Events: None.  Interim History: Patient was evaluated at bedside. Daughter and husband were present. She is alert and engaged in the conversation. Oriented to self and place. Reoriented her to time and situation. Has a migraine, consistent with chronic migraines. No other new concerns.   OBJECTIVE:  Vital Signs: Vitals:   12/18/22 2348 12/19/22 0325 12/19/22 0750 12/19/22 1152  BP: 123/65 126/63 118/86 115/68  Pulse: 62 81 (!) 54 73  Resp: 16 16 18 18   Temp: 98.6 F (37 C) 98.2 F (36.8 C) 98.4 F (36.9 C) 98.2 F (36.8 C)  TempSrc:  Oral Oral Oral  SpO2: 95% 95% 96% 96%  Weight:      Height:       Supplemental O2: Room Air SpO2: 96 %  Filed Weights   12/16/22 1039  Weight: 69.9 kg    Intake/Output Summary (Last 24 hours) at 12/19/2022 1417 Last data filed at 12/19/2022 1018 Gross per 24 hour  Intake 1455.62 ml  Output 1400 ml  Net 55.62 ml   Net IO Since Admission: 1,026.04 mL [12/19/22 1417]  Physical Exam:  General: alert, laying in bed comfortably, in NAD Cardiac: Afib, rate controlled, no m/r/g Pulmonary: CTA bilaterally, regular rate/effort, no accessory muscle use Abdomen: soft, non-distended Skin: warm and dry Neuro: Alert, A&O x 2, responding appropriately, no focal deficits.   Patient Lines/Drains/Airways Status     Active Line/Drains/Airways     Name Placement date Placement time Site Days   Peripheral IV 12/16/22 20 G Right Antecubital 12/16/22  1047  Antecubital  1   Peripheral IV 12/16/22 18 G Anterior;Left;Proximal Forearm 12/16/22  1055  Forearm  1   External Urinary Catheter 12/16/22  1640  --  1            Pertinent Labs:     Latest Ref Rng & Units 12/19/2022    5:43 AM 12/18/2022    7:33 AM 12/17/2022   11:57 AM  CBC  WBC 4.0 - 10.5 K/uL 7.5  8.9  9.1   Hemoglobin 12.0 - 15.0 g/dL 16.1  09.6  04.5   Hematocrit 36.0 - 46.0 % 39.5  42.3  43.4   Platelets 150 - 400 K/uL 153  159  181        Latest Ref Rng & Units 12/19/2022    5:43 AM 12/18/2022    7:33 AM 12/17/2022   11:57 AM  CMP  Glucose 70 - 99 mg/dL 92  409  811   BUN 8 - 23 mg/dL 13  11  12    Creatinine 0.44 - 1.00 mg/dL 9.14  7.82  9.56   Sodium 135 - 145 mmol/L 139  139  143   Potassium 3.5 - 5.1 mmol/L 3.0  3.3  3.6   Chloride 98 - 111 mmol/L 111  111  110   CO2 22 - 32 mmol/L 21  20  22    Calcium 8.9 - 10.3 mg/dL 8.7  8.9  9.1    No results for input(s): "GLUCAP" in the last 72 hours.  ASSESSMENT/PLAN:  Assessment: Principal Problem:   Acute encephalopathy Active Problems:   Acute cystitis without hematuria   Sepsis with encephalopathy without septic shock (HCC)  Plan: Acute encephalopathy, resolving Sepsis, secondary to urinary tract infection (klebsiella pneumonia) Mental status much improved, patient alert and responding appropriately this morning both verbally and to commands. HR <100 this morning, hemodynamically stable. No growth on blood cultures. She received 3 days of ceftriaxone, switching to oral antibiotics today. She was evaluated by PT/OT and acute rehab was recommended to regain strength. Not a CIR candidate. Patient, daughter and husband would prefer home health or outpatient therapy near [place].  -Switched to cefadroxil 500 mg BID -Trend CBC, BMP -TOC consulted; appreciate assistance for [facility] or outpatient therapy coordination  Atrial fibrillation with RVR, likely secondary to acute illness History of Atrial fibrillation s/p ablation Has a history of several cardioversions and an ablation in 2021. She was weaned off the diltiazem drip and restarted on home dose of oral diltiazem. She is rate controlled and clinically much  improved on IV amiodarone, which we will transition to oral today. No concerns of palpitations, shortness of breath, lightheadedness/dizziness or chest pain today.  -Weaning IV amiodarone, transitioning to oral amiodarone 200 mg daily -Continuous cardiac monitoring -Needs outpatient follow up with cardiology -Continue therapeutic Lovenox  Chronic conditions: some home meds restarted today Hypertension- BP is WNL. Restarted diltiazem 240 mg daily. Holding irbesartan 75 mg as BP wnl. Hypothyroidism- TSH 1.1. On synthroid 75 mcg daily.  Memory loss- Follows with neurology. Holding memantine. Hyperlipidemia- on crestor 20 mg daily Depression- on Zoloft 25 mg daily LE edema- Holding Lasix 40 mg Chronic back pain/neuropathy- Holding baclofen 10 mg daily Nausea- Holding promethazine 25 mg  Potassium 20 meq when lasix is taken Migraines- restarted Tramadol 50 mg q4h as needed  Best Practice: Diet: Healthy heart IVF: Fluids: None VTE: Lovenox 70 mg daily Code: Full AB: Cefadroxil Family Contact: daughter and husband, at bedside. DISPO: Anticipated discharge in 1-3 days pending medical treatment.  Signature: Annett Fabian, MD  Internal Medicine Resident, PGY-1 [facility]  Pager: [phone] 2:17 PM, 12/19/2022   Please contact the on call pager after 5 pm and on weekends at [phone].

## 2022-12-20 DIAGNOSIS — G9340 Encephalopathy, unspecified: Secondary | ICD-10-CM | POA: Diagnosis not present

## 2022-12-20 LAB — BASIC METABOLIC PANEL
Anion gap: 4 — ABNORMAL LOW (ref 5–15)
BUN: 15 mg/dL (ref 8–23)
CO2: 24 mmol/L (ref 22–32)
Calcium: 8.9 mg/dL (ref 8.9–10.3)
Chloride: 113 mmol/L — ABNORMAL HIGH (ref 98–111)
Creatinine, Ser: 1.34 mg/dL — ABNORMAL HIGH (ref 0.44–1.00)
GFR, Estimated: 40 mL/min — ABNORMAL LOW (ref >60)
Glucose, Bld: 97 mg/dL (ref 70–99)
Potassium: 3.8 mmol/L (ref 3.5–5.1)
Sodium: 141 mmol/L (ref 135–145)

## 2022-12-20 LAB — CBC
HCT: 43.7 % (ref 36.0–46.0)
Hemoglobin: 13.8 g/dL (ref 12.0–15.0)
MCH: 27.5 pg (ref 26.0–34.0)
MCHC: 31.6 g/dL (ref 30.0–36.0)
MCV: 87.2 fL (ref 80.0–100.0)
Platelets: 142 10*3/uL — ABNORMAL LOW (ref 150–400)
RBC: 5.01 MIL/uL (ref 3.87–5.11)
RDW: 14.4 % (ref 11.5–15.5)
WBC: 7.7 10*3/uL (ref 4.0–10.5)
nRBC: 0 % (ref 0.0–0.2)

## 2022-12-20 MED ORDER — APIXABAN 5 MG PO TABS: 5.0000 mg | ORAL_TABLET | Freq: Two times a day (BID) | ORAL | Status: DC

## 2022-12-20 NOTE — TOC Progression Note (Signed)
Transition of Care [facility]) - Progression Note    Patient Details  Name: Bridget [name] MRN: [id number] Date of Birth: 11-Feb-1941  Transition of Care [facility]) CM/SW Contact  Kermit Balo, RN Phone Number: 12/20/2022, 9:59 AM  Clinical Narrative:     Patient and family prefer she d/c home with therapies and not to a SNF rehab. CM called and spoke with patients daughter, Angelica Chessman as per pt and spouse request. Angelica Chessman provided choice under medicare.gov for home therapies. She had no preference. CM has arranged home health with [facility]. Information on the AVS. Frances Furbish will contact them for the first home visit.  Pt has transportation home when medically ready.  Expected Discharge Plan: Home w Home Health Services Barriers to Discharge: Continued Medical Work up  Expected Discharge Plan and Services   Discharge Planning Services: CM Consult Post Acute Care Choice: Home Health Living arrangements for the past 2 months: Single Family Home                           HH Arranged: PT, OT, Nurse's Aide HH Agency: [facility] Date [place] Agency Contacted: 12/20/22   Representative spoke with at [facility] Agency: Kandee Keen   Social Determinants of Health (SDOH) Interventions SDOH Screenings   Food Insecurity: No Food Insecurity (12/19/2022)  Housing: Low Risk  (12/19/2022)  Transportation Needs: No Transportation Needs (12/19/2022)  Utilities: Not At Risk (12/19/2022)  Social Connections: Unknown (07/31/2021)   Received from [facility]  Tobacco Use: Low Risk  (12/19/2022)    Readmission Risk Interventions     No data to display

## 2022-12-20 NOTE — Plan of Care (Signed)

## 2022-12-20 NOTE — Discharge Summary (Signed)
Name: Bridget [name] MRN: [id number] DOB: 07/20/1940 82 y.o. PCP: Marylen Ponto, MD  Date of Admission: 12/16/2022 10:36 AM Date of Discharge:  12/21/2022 Attending Physician: Dr. Criselda Peaches  DISCHARGE DIAGNOSIS:  Primary Problem: Acute encephalopathy   Hospital Problems: Principal Problem:   Acute encephalopathy Active Problems:   Acute cystitis without hematuria   Sepsis with encephalopathy without septic shock (HCC)    DISCHARGE MEDICATIONS:   Allergies as of 12/21/2022       Reactions   Codeine Anaphylaxis   Other Reaction(s): GI Intolerance   Sulfonamide Derivatives Anaphylaxis   Triptans Other (See Comments)   Stopped blood supply to mesenteric artery per pt Other Reaction(s): Other (See Comments)   Amlodipine Other (See Comments)   swelling   Chocolate Other (See Comments)   Headache   Onion Other (See Comments)   Headache   Other    MSG- headaches        Medication List     STOP taking these medications    promethazine 25 MG tablet Commonly known as: PHENERGAN       TAKE these medications    amiodarone 200 MG tablet Commonly known as: PACERONE Take 1 tablet (200 mg total) by mouth daily.   ARTIFICIAL TEARS PF OP Place 1 drop into both eyes daily as needed (For dry eyes).   baclofen 10 MG tablet Commonly known as: LIORESAL Take 0.5 tablets by mouth as needed (as needed for migraine pain and neuropathy.).   cefadroxil 500 MG capsule Commonly known as: DURICEF Take 1 capsule (500 mg total) by mouth 2 (two) times daily for 2 days.   diltiazem 240 MG 24 hr capsule Commonly known as: CARDIZEM CD TAKE 1 CAPSULE BY MOUTH EVERY DAY   diltiazem 30 MG tablet Commonly known as: CARDIZEM Take 2 tablets by mouth every 6 hours as needed for palpitations.   Eliquis 5 MG Tabs tablet Generic drug: apixaban TAKE 1 TABLET BY MOUTH TWICE A DAY   furosemide 20 MG tablet Commonly known as: LASIX Take 2 tablets (40 mg total) by mouth daily as needed  for edema (weight gain and/or swelling).   irbesartan 75 MG tablet Commonly known as: AVAPRO Take 1 tablet (75 mg total) by mouth daily.   Klor-Con M20 20 MEQ tablet Generic drug: potassium chloride SA TAKE 1 TABLET (20 MEQ TOTAL) BY MOUTH AS NEEDED (TAKE ON DAYS LASIX IS TAKEN).   levothyroxine 75 MCG tablet Commonly known as: SYNTHROID Take 75 mcg by mouth daily before breakfast.   memantine 5 MG tablet Commonly known as: NAMENDA Take 5 mg by mouth 2 (two) times daily.   rosuvastatin 20 MG tablet Commonly known as: CRESTOR Take 1 tablet (20 mg total) by mouth daily.   sertraline 25 MG tablet Commonly known as: ZOLOFT Take 25 mg by mouth daily.   traMADol 50 MG tablet Commonly known as: ULTRAM Take 50-100 mg by mouth every 4 (four) hours as needed for moderate pain (as needed for migraine pain and neuropathy.).        DISPOSITION AND FOLLOW-UP:  Bridget [name] was discharged from [facility] in [place] condition. At the hospital follow up visit please address:  Acute encephalopathy, resolved Urinary tract infection (klebsiella pneumoniae): Recheck CBC, BMP. Recheck blood pressure. Assess mental status. Screen for urinary symptoms.   Atrial fibrillation, rate controlled: Check HR and rhythm. Assess for cardiac symptoms. Ensure adherence to medications including amiodarone, diltiazem, eliquis. Ensure follow up with cardiology.  Deconditioning: Declined inpatient rehab/SNF placement, discharged with plans of home health PT/OT. Ensure participation. Assess fall risk and weakness.  Orthostasis: Orthostatics positive in hospital, likely secondary to acute illness. Reassess orthostatics outpatient and adjust medications as appropriate.   Follow-up Recommendations: Consults: Cardiology Labs: CBC, BMP Studies: Orthostatics Medications: daily amiodarone 200 mg, diltiazem 240 mg; continue other home meds  Follow-up Appointments:   Follow-up Information      Marylen Ponto, MD. Schedule an appointment as soon as possible for a visit.   Specialty: Family Medicine Contact information: [address] [place] [address] [phone]         Chrystie Nose, MD. Schedule an appointment as soon as possible for a visit.   Specialty: Cardiology Contact information: [address] [place] [address] [place] [address] [phone]         Quinn Plowman, PA-C Follow up on 12/26/2022.   Specialty: General Practice Contact information: [address] [place] [address] [phone]                HOSPITAL COURSE:  Patient Summary: Bridget [name] is a 82 y.o. person living with a past medical history of Afib s/p ablation (2021), HTN, HLD, GERD, migraines, and hypothyroidism who presents after being found unresponsive this morning and is admitted for acute encephalopathy, secondary to a klebsiella pneumoniae UTI. In the setting of her acute illness, she went into atrial fibrillation with RVR, now controlled with oral diltiazem and amiodarone.    Acute encephalopathy, resolved Sepsis, secondary to urinary tract infection (klebsiella pneumoniae) She presented with significant lethargy and was only responsive to pain.  Stroke workup was done which was negative.  Urine drug screen was negative.  Blood cultures were negative.  She was in persistent tachycardia, regular rhythm at the time of admission, but her blood pressure was stable. Urinalysis showed nitrites and many bacteria, consistent with a urinary tract infection.  Urine culture grew Klebsiella pneumonia.  The emergency department, the patient was started on ceftriaxone and her encephalopathy resolved over the course of a few days of antibiotic treatment.  She was transition to oral cefadroxil and completed 5 days out of a 7 day course of antibiotics for treatment of her urinary tract infection. She will be discharged with the remaining course. Her mental status returned to  baseline.    History of Atrial fibrillation s/p ablation She has a history of several cardioversions and an ablation in 2021.  On presentation to the emergency department, she was in sinus tachycardia and hemodynamically stable.  She remained in tachycardia despite treatment of her sepsis, so she was started on a diltiazem drip.  She continued to be tachycardic with a heart rate in the 130s and went into A-fib with RVR, so cardiology was consulted and recommended starting her on an amiodarone drip.  Her heart rate improved and her amiodarone was transitioned to oral 200 mg daily.  She remained hemodynamically stable, rate controlled in atrial fibrillation, and was deemed medically stable for discharge with outpatient follow-up with cardiology.  She has been on chronic anticoagulation with Eliquis 5 mg twice daily, which we are continuing on her discharge.      DISCHARGE INSTRUCTIONS:   It was a pleasure taking care of you at [facility].  You were admitted for urinary tract infection and completed a course of antibiotics.  Your symptoms improved and you are now medically stable for discharge.  Please take one tablet of your antibiotic, cefadroxil, twice a  day for the next two days. This completes a 7-day course to treat your urinary tract infection.  We started you on amiodarone 200 mg, 1 tablet daily, for your atrial fibrillation.  We recommend following up with your cardiologist Dr. Rennis Golden soon given your atrial fibrillation during your hospital stay.  He may adjust this medication.  We also recommend you call your primary care provider, Dr. Leonor Liv, and make an appointment within the next week for hospital follow-up visit.  It is very important that you be vigilant in monitoring for symptoms of a recurrent urinary tract infection including pain with urination, changes in urinary frequency including urinating more frequently or less frequently than usual, or any urinary discharge or changes in  urine color.  If you notice any of the symptoms, please contact your primary care provider to be evaluated.  If you experience any new confusion, lightheadedness and dizziness, fevers, chills, night sweats, or persistent nausea and vomiting, I recommend you return to the emergency department for evaluation.  SUBJECTIVE:   Patient was evaluated at bedside.  She says she is doing well this morning with no concerns.  She does note a little persistent dysuria, but says her urine remains clear.  She otherwise denies any symptoms.  Discharge Vitals:   BP 121/68 (BP Location: Right Arm)   Pulse (!) 115 Comment: RN notified  Temp 98.6 F (37 C) (Oral)   Resp 16   Ht 5\' 7"  (1.702 m)   Wt 69.9 kg   LMP  (LMP Unknown)   SpO2 99%   BMI 24.14 kg/m   OBJECTIVE:  Physical Exam HENT:     Head: Normocephalic and atraumatic.     Mouth/Throat:     Mouth: Mucous membranes are moist.  Eyes:     Extraocular Movements: Extraocular movements intact.     Pupils: Pupils are equal, round, and reactive to light.  Cardiovascular:     Rate and Rhythm: Normal rate and regular rhythm.     Pulses: Normal pulses.     Heart sounds: Normal heart sounds.  Pulmonary:     Effort: Pulmonary effort is normal.     Breath sounds: Normal breath sounds.  Abdominal:     General: Abdomen is flat.     Palpations: Abdomen is soft.  Musculoskeletal:        General: Normal range of motion.     Cervical back: Normal range of motion.  Skin:    General: Skin is warm.  Neurological:     Mental Status: She is alert. Mental status is at baseline.     Comments: Oriented to person and place, but not time.      Pertinent Labs, Studies, and Procedures:     Latest Ref Rng & Units 12/21/2022    8:00 AM 12/20/2022    5:14 AM 12/19/2022    5:43 AM  CBC  WBC 4.0 - 10.5 K/uL 6.1  7.7  7.5   Hemoglobin 12.0 - 15.0 g/dL 81.1  91.4  78.2   Hematocrit 36.0 - 46.0 % 45.3  43.7  39.5   Platelets 150 - 400 K/uL 146  142  153         Latest Ref Rng & Units 12/21/2022    8:00 AM 12/20/2022    5:14 AM 12/19/2022    5:43 AM  CMP  Glucose 70 - 99 mg/dL 92  97  92   BUN 8 - 23 mg/dL 15  15  13    Creatinine 0.44 -  1.00 mg/dL 1.61  0.96  0.45   Sodium 135 - 145 mmol/L 137  141  139   Potassium 3.5 - 5.1 mmol/L 3.6  3.8  3.0   Chloride 98 - 111 mmol/L 108  113  111   CO2 22 - 32 mmol/L 21  24  21    Calcium 8.9 - 10.3 mg/dL 8.9  8.9  8.7     MR BRAIN WO CONTRAST Result Date: 12/16/2022 IMPRESSION: No acute or reversible finding. Generalized age related volume loss. Moderate chronic small-vessel ischemic changes of the cerebral hemispheric white matter. Electronically Signed   By: Paulina Fusi M.D.   On: 12/16/2022 16:10   CT Head Wo Contrast Result Date: 12/16/2022 IMPRESSION: 1. No evidence of an acute intracranial abnormality. 2. Parenchymal atrophy and mild cerebral white matter chronic small vessel ischemic disease. Electronically Signed   By: Jackey Loge D.O.   On: 12/16/2022 12:41   DG Chest Portable 1 View Result Date: 12/16/2022 IMPRESSION: No acute cardiopulmonary process. Electronically Signed   By: Genevive Bi M.D.   On: 12/16/2022 11:22     Signed: Annett Fabian, MD Internal Medicine Resident, PGY-1 [facility]  Pager: [phone] 1:19 PM, 12/21/2022

## 2022-12-20 NOTE — [place] FL2 (Signed)
[place] MEDICAID FL2 LEVEL OF CARE FORM     IDENTIFICATION  Patient Name: Bridget [name] Birthdate: July 02, 1940 Sex: female Admission Date (Current Location): 12/16/2022  [facility] and [place] Number:  [employer] and Address:  [facility], [address], [place], [place] [address]      Provider Number: [id number]  Attending Physician Name and Address:  Inez Catalina, MD  Relative Name and Phone Number:       Current Level of Care: Hospital Recommended Level of Care: Skilled Nursing Facility Prior Approval Number:    Date Approved/Denied:   PASRR Number: [id number]  Discharge Plan: SNF    Current Diagnoses: Patient Active Problem List   Diagnosis Date Noted   Acute cystitis without hematuria 12/17/2022   Sepsis with encephalopathy without septic shock (HCC) 12/17/2022   Acute encephalopathy 12/16/2022   Nonischemic cardiomyopathy (HCC)    Persistent atrial fibrillation (HCC) 07/10/2019   Back pain 04/18/2015   Lumbar herniated disc 11/13/2014   Peripheral polyneuropathy 07/30/2013   Migraine headache 07/30/2013   Chronic neck pain 07/10/2013   ADENOMATOUS COLONIC POLYP 11/12/2008   Hypothyroidism 08/22/2007   Lumbar radiculopathy 08/22/2007   Disorder of joint 08/22/2007   Arthropathia 08/22/2007   Hyperlipidemia 08/21/2007   Hypertension 08/21/2007   GERD (gastroesophageal reflux disease) 08/21/2007   Diverticulosis of large intestine 08/21/2007   Headache 08/21/2007   History of colonic polyps 08/21/2007    Orientation RESPIRATION BLADDER Height & Weight     Place, Self  Normal Incontinent Weight: 154 lb 1.6 oz (69.9 kg) Height:  5\' 7"  (170.2 cm)  BEHAVIORAL SYMPTOMS/MOOD NEUROLOGICAL BOWEL NUTRITION STATUS      Continent Diet (Regular Room Service)  AMBULATORY STATUS COMMUNICATION OF NEEDS Skin   Limited Assist Verbally Normal                       Personal Care Assistance Level of Assistance  Bathing,  Feeding, Dressing Bathing Assistance: Maximum assistance Feeding assistance: Limited assistance Dressing Assistance: Maximum assistance     Functional Limitations Info             SPECIAL CARE FACTORS FREQUENCY  PT (By licensed PT), OT (By licensed OT)     PT Frequency: 5x/week OT Frequency: 5x/week            Contractures      Additional Factors Info  Code Status, Allergies Code Status Info: Full Allergies Info: Codeine, Sulfonamide Derivatives, Triptans, Amlodipine, Chocolate, Onion, Other           Current Medications (12/20/2022):  This is the current hospital active medication list Current Facility-Administered Medications  Medication Dose Route Frequency Provider Last Rate Last Admin   acetaminophen (TYLENOL) tablet 650 mg  650 mg Oral Q6H PRN Annett Fabian, MD   650 mg at 12/18/22 2023   amiodarone (PACERONE) tablet 200 mg  200 mg Oral Daily Annett Fabian, MD   200 mg at 12/20/22 0858   cefadroxil (DURICEF) capsule 500 mg  500 mg Oral BID Debe Coder B, MD   500 mg at 12/20/22 1059   diltiazem (CARDIZEM CD) 24 hr capsule 240 mg  240 mg Oral Daily Annett Fabian, MD   240 mg at 12/20/22 0858   enoxaparin (LOVENOX) injection 70 mg  70 mg Subcutaneous Q24H Silvana Newness, RPH   70 mg at 12/19/22 1955   levothyroxine (SYNTHROID) tablet 75 mcg  75 mcg Oral Q0600 Annett Fabian, MD  75 mcg at 12/20/22 0515   potassium chloride (KLOR-CON M) CR tablet 30 mEq  30 mEq Oral BID Annett Fabian, MD   30 mEq at 12/20/22 0856   rosuvastatin (CRESTOR) tablet 20 mg  20 mg Oral Daily Annett Fabian, MD   20 mg at 12/20/22 0858   sertraline (ZOLOFT) tablet 25 mg  25 mg Oral Daily Annett Fabian, MD   25 mg at 12/20/22 0856   traMADol (ULTRAM) tablet 50 mg  50 mg Oral Daily PRN Rana Snare, DO   50 mg at 12/19/22 1251     Discharge Medications: Please see discharge summary for a list of discharge medications.  Relevant Imaging Results:  Relevant Lab  Results:   Additional Information SSN: [id number]  Shaunice Levitan Felipa [place], Student-Social Work

## 2022-12-20 NOTE — Progress Notes (Signed)
Occupational Therapy Treatment Patient Details Name: Bridget [name] MRN: [id number] DOB: [birth year] Today's Date: 12/20/2022   History of present illness Bridget [name] is a 82 y.o. female who presents 9/28 after being found unresponsive and is admitted for acute encephalopathy possibly secondary to a UTI. PMH of Afib s/p ablation (2021), HTN, HLD, GERD, migraines, and hypothyroidism.   OT comments  Pt is making limited progress towards their acute OT goals. Her cognition was improved this date however she continues to have moments of confusion and has very limited insight to situation, safety and deficits. Overall she needed mod A for bed mobility and has a significant R/posterior lean in sitting requiring min-max to to stay at midline. Pt also needed mod A for standing, and continued to have the R/posterior lean. She tolerated standing and marching for <2 minutes and required a sitting rest break, unable to standing again. BP did drop from 113/73 to 103/68 once standing, but improved to 117/64 with marching. OT to continue to follow acutely to facilitate progress towards established goals. Discussed significant physical assist pt currently requires with pt's husband, and he is in agreement to consider skilled inpatient follow up therapy, <3 hours/day for safety and increased rehab.        If plan is discharge home, recommend the following:  A lot of help with walking and/or transfers;Two people to help with walking and/or transfers;A lot of help with bathing/dressing/bathroom;Two people to help with bathing/dressing/bathroom;Assistance with cooking/housework;Direct supervision/assist for medications management;Direct supervision/assist for financial management;Assist for transportation;Help with stairs or ramp for entrance   Equipment Recommendations  Other (comment)       Precautions / Restrictions Precautions Precautions: Fall Precaution Comments: watch vitals Restrictions Weight Bearing  Restrictions: No       Mobility Bed Mobility Overal bed mobility: Needs Assistance Bed Mobility: Supine to Sit     Supine to sit: Mod assist Sit to supine: Max assist        Transfers Overall transfer level: Needs assistance Equipment used: Rolling walker (2 wheels), 1 person hand held assist Transfers: Sit to/from Stand Sit to Stand: Mod assist           General transfer comment: Mod A to boost and to correct R/posterior lean. Only tolerated standing for <2 minutes     Balance Overall balance assessment: Needs assistance Sitting-balance support: Feet supported Sitting balance-Leahy Scale: Poor Sitting balance - Comments: min-max A to correct R/posterior lean   Standing balance support: Bilateral upper extremity supported Standing balance-Leahy Scale: Poor       ADL either performed or assessed with clinical judgement   ADL Overall ADL's : Needs assistance/impaired     Grooming: Moderate assistance;Sitting Grooming Details (indicate cue type and reason): mod A to correct R lateral lean and for cues                 Toilet Transfer: Maximal assistance;Stand-pivot;Rolling walker (2 wheels);BSC/3in1 Toilet Transfer Details (indicate cue type and reason): simulated         Functional mobility during ADLs: Moderate assistance;Rolling walker (2 wheels);Cueing for safety General ADL Comments: limited by significant R lateral lean, confusion, poor insight, activity tolerance    Extremity/Trunk Assessment Upper Extremity Assessment Upper Extremity Assessment: Generalized weakness   Lower Extremity Assessment Lower Extremity Assessment: Generalized weakness        Vision   Vision Assessment?: No apparent visual deficits   Perception Perception Perception: Not tested   Praxis Praxis Praxis: Not tested  Cognition Arousal: Alert Behavior During Therapy: Flat affect Overall Cognitive Status: Impaired/Different from baseline Area of Impairment:  Attention, Memory, Following commands, Safety/judgement, Awareness, Problem solving                 Orientation Level: Disoriented to, Situation Current Attention Level: Sustained Memory: Decreased recall of precautions, Decreased short-term memory Following Commands: Follows one step commands consistently Safety/Judgement: Decreased awareness of deficits, Decreased awareness of safety Awareness: Intellectual Problem Solving: Slow processing, Decreased initiation, Requires verbal cues General Comments: Cognition improved since last session, however pt conitnues to have lack of awareness in situation, deficits and safety.              General Comments non-orthostatic drop in BP. denied dizziness. Husband present and admits he would not be able to assist the pt as much as I would at d/c    Pertinent Vitals/ Pain       Pain Assessment Pain Assessment: Faces Faces Pain Scale: No hurt Pain Intervention(s): Monitored during session         Frequency  Min 1X/week        Progress Toward Goals  OT Goals(current goals can now be found in the care plan section)  Progress towards OT goals: Progressing toward goals (limited)  Acute Rehab OT Goals Patient Stated Goal: to get better OT Goal Formulation: Patient unable to participate in goal setting Time For Goal Achievement: 01/01/23 Potential to Achieve Goals: Good ADL Goals Pt Will Perform Grooming: with set-up;sitting Pt Will Perform Upper Body Dressing: with set-up;sitting Pt Will Perform Lower Body Dressing: with min assist;sit to/from stand Pt Will Transfer to Toilet: with min assist;ambulating;regular height toilet Additional ADL Goal #1: Pt will follow 75% of simple 1 step commands during functional activity   AM-PAC OT "6 Clicks" Daily Activity     Outcome Measure   Help from another person eating meals?: A Little Help from another person taking care of personal grooming?: A Lot Help from another person  toileting, which includes using toliet, bedpan, or urinal?: A Lot Help from another person bathing (including washing, rinsing, drying)?: A Lot Help from another person to put on and taking off regular upper body clothing?: A Lot Help from another person to put on and taking off regular lower body clothing?: A Lot 6 Click Score: 13    End of Session Equipment Utilized During Treatment: Gait belt;Rolling walker (2 wheels)  OT Visit Diagnosis: Unsteadiness on feet (R26.81);Other abnormalities of gait and mobility (R26.89);Muscle weakness (generalized) (M62.81)   Activity Tolerance Patient tolerated treatment well   Patient Left in bed;with call bell/phone within reach;with family/visitor present   Nurse Communication Mobility status (needs purwick)        Time: 5284-1324 OT Time Calculation (min): 26 min  Charges: OT General Charges $OT Visit: 1 Visit OT Treatments $Therapeutic Activity: 23-37 mins  Derenda Mis, OTR/L Acute Rehabilitation Services Office [phone] Secure Chat Communication Preferred   Donia Pounds 12/20/2022, 11:11 AM

## 2022-12-20 NOTE — Progress Notes (Signed)
Physical Therapy Treatment Patient Details Name: Bridget [name] MRN: [id number] DOB: [birth year] Today's Date: 12/20/2022   History of Present Illness Bridget [name] is a 82 y.o. female who presents 9/28 after being found unresponsive and is admitted for acute encephalopathy possibly secondary to a UTI. PMH of Afib s/p ablation (2021), HTN, HLD, GERD, migraines, and hypothyroidism.    PT Comments  Doing a bit better functionally compared to her ability earlier this morning with OT. Still required up to min assist with bed mobility, transfer, and gait in room. Leaning towards Rt while walking, needs assist for RW control. Still confused. Husband in room, supportive, and observed session. Since she was denied by CIR would favor short term SNF to improve safety and independence prior to returning home with husband's help. This would reduce burden of care and fall risk. Will continue to progress patient as tolerated during admission. Patient will benefit from continued inpatient follow up therapy, <3 hours/day. Patient will continue to benefit from skilled physical therapy services to further improve independence with functional mobility.  Denies dizziness:  12/20/22 1100  Orthostatic Lying   BP- Lying 117/65  Pulse- Lying 68  Orthostatic Sitting  BP- Sitting 106/70  Pulse- Sitting 80  Orthostatic Standing at 0 minutes  BP- Standing at 0 minutes 92/66  Pulse- Standing at 0 minutes 89  Orthostatic Standing at 3 minutes  BP- Standing at 3 minutes 98/67  Pulse- Standing at 3 minutes 94      If plan is discharge home, recommend the following: A little help with bathing/dressing/bathroom;Assistance with cooking/housework;Direct supervision/assist for medications management;Direct supervision/assist for financial management;Assist for transportation;Help with stairs or ramp for entrance;Supervision due to cognitive status;A little help with walking and/or transfers   Can travel by private vehicle      Yes (If her progress remains consistent)  Equipment Recommendations  Other (comment) (TBD)    Recommendations for Other Services       Precautions / Restrictions Precautions Precautions: Fall Precaution Comments: watch vitals Restrictions Weight Bearing Restrictions: No     Mobility  Bed Mobility Overal bed mobility: Needs Assistance Bed Mobility: Supine to Sit     Supine to sit: Min assist     General bed mobility comments: Min assist for trunk support to rise. Verbal and tactile cues for technique    Transfers Overall transfer level: Needs assistance Equipment used: Rolling walker (2 wheels) Transfers: Sit to/from Stand Sit to Stand: Min assist           General transfer comment: Min assist for boost to stand, cues for technique, hands on RW for support upon rising.    Ambulation/Gait Ambulation/Gait assistance: Min assist Gait Distance (Feet): 12 Feet Assistive device: Rolling walker (2 wheels) Gait Pattern/deviations: Step-through pattern, Decreased stride length, Shuffle Gait velocity: decr Gait velocity interpretation: <1.31 ft/sec, indicative of household ambulator   General Gait Details: Reviewed RW use and placement. Min assist for balance (leans Rt) and RW control in congested areas in room. VC for simple directions. No buckling. Husband present and observing.   Stairs             Wheelchair Mobility     Tilt Bed    Modified Rankin (Stroke Patients Only)       Balance Overall balance assessment: Needs assistance Sitting-balance support: Feet supported Sitting balance-Leahy Scale: Fair Sitting balance - Comments: CGA   Standing balance support: Bilateral upper extremity supported Standing balance-Leahy Scale: Poor Standing balance comment: Stand with RW, CGA  Cognition Arousal: Alert Behavior During Therapy: Flat affect Overall Cognitive Status: Impaired/Different from baseline Area  of Impairment: Attention, Memory, Following commands, Safety/judgement, Awareness, Problem solving                 Orientation Level: Disoriented to, Situation Current Attention Level: Sustained Memory: Decreased recall of precautions, Decreased short-term memory Following Commands: Follows one step commands consistently Safety/Judgement: Decreased awareness of deficits, Decreased awareness of safety Awareness: Intellectual Problem Solving: Slow processing, Decreased initiation, Requires verbal cues          Exercises      General Comments General comments (skin integrity, edema, etc.): See orthostatic vitals      Pertinent Vitals/Pain Pain Assessment Pain Assessment: No/denies pain    Home Living                          Prior Function            PT Goals (current goals can now be found in the care plan section) Acute Rehab PT Goals Patient Stated Goal: Husband okay with post acute rehab to get well. PT Goal Formulation: With patient/family Time For Goal Achievement: 01/01/23 Potential to Achieve Goals: Good Progress towards PT goals: Progressing toward goals    Frequency    Min 1X/week      PT Plan      Co-evaluation              AM-PAC PT "6 Clicks" Mobility   Outcome Measure  Help needed turning from your back to your side while in a flat bed without using bedrails?: A Little Help needed moving from lying on your back to sitting on the side of a flat bed without using bedrails?: A Little Help needed moving to and from a bed to a chair (including a wheelchair)?: A Little Help needed standing up from a chair using your arms (e.g., wheelchair or bedside chair)?: A Little Help needed to walk in hospital room?: A Little Help needed climbing 3-5 steps with a railing? : A Lot 6 Click Score: 17    End of Session Equipment Utilized During Treatment: Gait belt Activity Tolerance: Patient tolerated treatment well Patient left: in  chair;with call bell/phone within reach;with chair alarm set;with SCD's reapplied;with family/visitor present Nurse Communication: Mobility status (No purewick in place.) PT Visit Diagnosis: Unsteadiness on feet (R26.81);Other abnormalities of gait and mobility (R26.89);Muscle weakness (generalized) (M62.81);Other symptoms and signs involving the nervous system (R29.898)     Time: 5784-6962 PT Time Calculation (min) (ACUTE ONLY): 24 min  Charges:    $Gait Training: 8-22 mins $Therapeutic Activity: 8-22 mins PT General Charges $$ ACUTE PT VISIT: 1 Visit                     Kathlyn Sacramento, PT, DPT [facility]  Rehabilitation Services Physical Therapist Office: [phone] Website: [email or web address]    Berton Mount 12/20/2022, 12:31 PM

## 2022-12-20 NOTE — Progress Notes (Signed)
HD#4 SUBJECTIVE:  Patient Summary: Bridget [name] is a 82 y.o. female with a pertinent PMH of Afib s/p ablation (2021), HTN, HLD, GERD, migraines, and hypothyroidism who presents after being found unresponsive this morning and is admitted for acute encephalopathy possibly secondary to a UTI.  Overnight Events: None.  Interim History: Patient was evaluated at bedside. Husband was present. No new concerns. Discussed ongoing rehab needs after discharge; patient and husband now open to SNF instead of home, since they have realized she needs a lot of assistance.   OBJECTIVE:  Vital Signs: Vitals:   12/19/22 1942 12/20/22 0010 12/20/22 0315 12/20/22 0738  BP: 129/69 137/76 137/84 134/72  Pulse: 73 69 (!) 109 70  Resp: 16 18 16 17   Temp: 98.6 F (37 C) 98.7 F (37.1 C) 98.3 F (36.8 C) 98.8 F (37.1 C)  TempSrc: Oral Oral Oral Oral  SpO2: 96% 95% 96% 96%  Weight:      Height:       Supplemental O2: Room Air SpO2: 96 %  Filed Weights   12/16/22 1039  Weight: 69.9 kg    Intake/Output Summary (Last 24 hours) at 12/20/2022 1254 Last data filed at 12/20/2022 0010 Gross per 24 hour  Intake --  Output 300 ml  Net -300 ml   Net IO Since Admission: 726.04 mL [12/20/22 1254]  Physical Exam: General: alert, laying in bed comfortably, in NAD Cardiac: Afib, rate controlled, no m/r/g Pulmonary: CTA bilaterally, regular rate/effort, no accessory muscle use Abdomen: soft, non-distended Skin: warm and dry Neuro: Alert, A&O x 2, responding appropriately, no focal deficits  Patient Lines/Drains/Airways Status     Active Line/Drains/Airways     Name Placement date Placement time Site Days   Peripheral IV 12/16/22 20 G Right Antecubital 12/16/22  1047  Antecubital  1   Peripheral IV 12/16/22 18 G Anterior;Left;Proximal Forearm 12/16/22  1055  Forearm  1   External Urinary Catheter 12/16/22  1640  --  1            Pertinent Labs:    Latest Ref Rng & Units 12/20/2022    5:14  AM 12/19/2022    5:43 AM 12/18/2022    7:33 AM  CBC  WBC 4.0 - 10.5 K/uL 7.7  7.5  8.9   Hemoglobin 12.0 - 15.0 g/dL 08.6  57.8  46.9   Hematocrit 36.0 - 46.0 % 43.7  39.5  42.3   Platelets 150 - 400 K/uL 142  153  159        Latest Ref Rng & Units 12/20/2022    5:14 AM 12/19/2022    5:43 AM 12/18/2022    7:33 AM  CMP  Glucose 70 - 99 mg/dL 97  92  629   BUN 8 - 23 mg/dL 15  13  11    Creatinine 0.44 - 1.00 mg/dL 5.28  4.13  2.44   Sodium 135 - 145 mmol/L 141  139  139   Potassium 3.5 - 5.1 mmol/L 3.8  3.0  3.3   Chloride 98 - 111 mmol/L 113  111  111   CO2 22 - 32 mmol/L 24  21  20    Calcium 8.9 - 10.3 mg/dL 8.9  8.7  8.9    No results for input(s): "GLUCAP" in the last 72 hours.  ASSESSMENT/PLAN:  Assessment: Principal Problem:   Acute encephalopathy Active Problems:   Acute cystitis without hematuria   Sepsis with encephalopathy without septic shock (HCC)  Plan: Acute encephalopathy,  resolving Sepsis, secondary to urinary tract infection (klebsiella pneumonia) Patient clinically improved, still has diffuse weakness. Vitals wnl. Husband was bedside. Spoke extensively with the patient and husband about our concerns for returning home. They agreed that she needs significant assistance in getting out of bed and all activities of daily living. We recommended a short stay at a SNF, and they agreed, preferring one close to where they live in [place]. OT saw the patient and their recommendations are in agreement with this assessment. We will place a TOC consult to begin the SNF placement process.  -cefadroxil 500 mg, one more tablet tonight to complete 5 day course -TOC consulted; appreciate assistance for SNF placement -Trend CBC, BMP  Atrial fibrillation with RVR, likely secondary to acute illness History of Atrial fibrillation s/p ablation Has a history of several cardioversions and an ablation in 2021. She is currently on oral diltiazem and amiodarone. HR controlled, HDS.  Asymptomatic. -Continue oral amiodarone 200 mg, diltiazem 240 mg daily -Continuous cardiac monitoring -Needs outpatient follow up with cardiology -Continue therapeutic Lovenox  Chronic conditions:  Hypertension- BP is WNL. Restarted diltiazem 240 mg daily. Holding irbesartan 75 mg as BP wnl. Hypothyroidism- TSH 1.1. On synthroid 75 mcg daily.  Memory loss- Follows with neurology. Holding memantine. Hyperlipidemia- on crestor 20 mg daily Depression- on Zoloft 25 mg daily LE edema- Holding Lasix 40 mg Chronic back pain/neuropathy- Holding baclofen 10 mg daily Nausea- Holding promethazine 25 mg  Potassium 20 meq when lasix is taken Migraines- restarted Tramadol 50 mg q4h as needed  Best Practice: Diet: Healthy heart IVF: Fluids: None VTE: Lovenox 70 mg daily Code: Full AB: Cefadroxil Family Contact: husband, at bedside. DISPO: Anticipated discharge in 1-3 days pending medical treatment.  Signature: Annett Fabian, MD  Internal Medicine Resident, PGY-1 [facility]  Pager: [phone] 12:54 PM, 12/20/2022   Please contact the on call pager after 5 pm and on weekends at [phone].

## 2022-12-21 ENCOUNTER — Other Ambulatory Visit (HOSPITAL_COMMUNITY): Payer: Self-pay

## 2022-12-21 DIAGNOSIS — G9340 Encephalopathy, unspecified: Secondary | ICD-10-CM | POA: Diagnosis not present

## 2022-12-21 LAB — CBC
HCT: 45.3 % (ref 36.0–46.0)
Hemoglobin: 14.1 g/dL (ref 12.0–15.0)
MCH: 27.4 pg (ref 26.0–34.0)
MCHC: 31.1 g/dL (ref 30.0–36.0)
MCV: 88.1 fL (ref 80.0–100.0)
Platelets: 146 10*3/uL — ABNORMAL LOW (ref 150–400)
RBC: 5.14 MIL/uL — ABNORMAL HIGH (ref 3.87–5.11)
RDW: 14.3 % (ref 11.5–15.5)
WBC: 6.1 10*3/uL (ref 4.0–10.5)
nRBC: 0 % (ref 0.0–0.2)

## 2022-12-21 LAB — BASIC METABOLIC PANEL
Anion gap: 8 (ref 5–15)
BUN: 15 mg/dL (ref 8–23)
CO2: 21 mmol/L — ABNORMAL LOW (ref 22–32)
Calcium: 8.9 mg/dL (ref 8.9–10.3)
Chloride: 108 mmol/L (ref 98–111)
Creatinine, Ser: 1.36 mg/dL — ABNORMAL HIGH (ref 0.44–1.00)
GFR, Estimated: 39 mL/min — ABNORMAL LOW (ref >60)
Glucose, Bld: 92 mg/dL (ref 70–99)
Potassium: 3.6 mmol/L (ref 3.5–5.1)
Sodium: 137 mmol/L (ref 135–145)

## 2022-12-21 LAB — CULTURE, BLOOD (ROUTINE X 2)
Culture: NO GROWTH
Culture: NO GROWTH
Special Requests: ADEQUATE

## 2022-12-21 MED ORDER — AMIODARONE HCL 200 MG PO TABS
200.0000 mg | ORAL_TABLET | Freq: Every day | ORAL | 0 refills | Status: DC
  Filled 2022-12-21: qty 30, 30d supply, fill #0

## 2022-12-21 MED ORDER — CEFADROXIL 500 MG PO CAPS
500.0000 mg | ORAL_CAPSULE | Freq: Two times a day (BID) | ORAL | Status: DC
  Administered 2022-12-21: 500 mg via ORAL
  Filled 2022-12-21 (×2): qty 1

## 2022-12-21 MED ORDER — CEFADROXIL 500 MG PO CAPS
500.0000 mg | ORAL_CAPSULE | Freq: Two times a day (BID) | ORAL | 0 refills | Status: AC
  Filled 2022-12-21: qty 4, 2d supply, fill #0

## 2022-12-21 NOTE — Plan of Care (Signed)
  Problem: Education: Goal: Knowledge of General Education information will improve Description Including pain rating scale, medication(s)/side effects and non-pharmacologic comfort measures Outcome: Progressing   Problem: Health Behavior/Discharge Planning: Goal: Ability to manage health-related needs will improve Outcome: Progressing   

## 2022-12-21 NOTE — TOC Transition Note (Signed)
Transition of Care [facility]) - CM/SW Discharge Note   Patient Details  Name: Bridget [name] MRN: [id number] Date of Birth: April 08, 1940  [facility]) CM/SW Contact:  Kermit Balo, RN Phone Number: 12/21/2022, 1:27 PM   Clinical Narrative:    Pt is discharging home with home health services through [facility]. Information on the AVS.  Pt has needed supervision at home.  Pts family providing transportation home.   Final next level of care: Home w Home Health Services Barriers to Discharge: No Barriers Identified   Patient Goals and CMS Choice CMS Medicare.gov Compare Post Acute Care list provided to:: Patient Represenative (must comment) Choice offered to / list presented to : Adult Children, Patient, Spouse  Discharge Placement                         Discharge Plan and Services Additional resources added to the After Visit Summary for     Discharge Planning Services: CM Consult Post Acute Care Choice: Home Health                    HH Arranged: PT, OT, Nurse's Aide [facility] Agency: [facility] Date [facility] Agency Contacted: 12/21/22   Representative spoke with at [facility] Agency: Kandee Keen  Social Determinants of Health (SDOH) Interventions SDOH Screenings   Food Insecurity: No Food Insecurity (12/19/2022)  Housing: Low Risk  (12/19/2022)  Transportation Needs: No Transportation Needs (12/19/2022)  Utilities: Not At Risk (12/19/2022)  Social Connections: Unknown (07/31/2021)   Received from [facility]  Tobacco Use: Low Risk  (12/19/2022)     Readmission Risk Interventions     No data to display

## 2022-12-21 NOTE — Plan of Care (Signed)

## 2022-12-21 NOTE — Plan of Care (Signed)
  Problem: Education: Goal: Knowledge of General Education information will improve Description: Including pain rating scale, medication(s)/side effects and non-pharmacologic comfort measures 12/21/2022 0158 by Charmian Muff, RN Outcome: Progressing 12/21/2022 0151 by Charmian Muff, RN Outcome: Progressing   Problem: Health Behavior/Discharge Planning: Goal: Ability to manage health-related needs will improve 12/21/2022 0158 by Charmian Muff, RN Outcome: Progressing 12/21/2022 0151 by Charmian Muff, RN Outcome: Progressing   Problem: Education: Goal: Knowledge of General Education information will improve Description: Including pain rating scale, medication(s)/side effects and non-pharmacologic comfort measures 12/21/2022 0158 by Charmian Muff, RN Outcome: Progressing 12/21/2022 0151 by Charmian Muff, RN Outcome: Progressing   Problem: Health Behavior/Discharge Planning: Goal: Ability to manage health-related needs will improve 12/21/2022 0158 by Charmian Muff, RN Outcome: Progressing 12/21/2022 0151 by Charmian Muff, RN Outcome: Progressing

## 2022-12-21 NOTE — Plan of Care (Signed)
  Problem: Education: Goal: Knowledge of General Education information will improve Description: Including pain rating scale, medication(s)/side effects and non-pharmacologic comfort measures 12/21/2022 0245 by Charmian Muff, RN Outcome: Progressing 12/21/2022 0158 by Charmian Muff, RN Outcome: Progressing 12/21/2022 0151 by Charmian Muff, RN Outcome: Progressing   Problem: Health Behavior/Discharge Planning: Goal: Ability to manage health-related needs will improve 12/21/2022 0245 by Charmian Muff, RN Outcome: Progressing 12/21/2022 0158 by Charmian Muff, RN Outcome: Progressing 12/21/2022 0151 by Charmian Muff, RN Outcome: Progressing   Problem: Clinical Measurements: Goal: Ability to maintain clinical measurements within normal limits will improve Outcome: Progressing

## 2023-01-03 DIAGNOSIS — G629 Polyneuropathy, unspecified: Secondary | ICD-10-CM | POA: Diagnosis not present

## 2023-01-03 DIAGNOSIS — I4891 Unspecified atrial fibrillation: Secondary | ICD-10-CM | POA: Diagnosis not present

## 2023-01-03 DIAGNOSIS — G319 Degenerative disease of nervous system, unspecified: Secondary | ICD-10-CM | POA: Diagnosis not present

## 2023-01-10 ENCOUNTER — Ambulatory Visit: Payer: Medicare HMO | Attending: Internal Medicine | Admitting: Internal Medicine

## 2023-01-10 ENCOUNTER — Encounter: Payer: Self-pay | Admitting: Internal Medicine

## 2023-01-10 VITALS — BP 132/84 | HR 83 | Ht 67.0 in | Wt 158.0 lb

## 2023-01-10 DIAGNOSIS — I480 Paroxysmal atrial fibrillation: Secondary | ICD-10-CM

## 2023-01-10 DIAGNOSIS — I428 Other cardiomyopathies: Secondary | ICD-10-CM

## 2023-01-10 DIAGNOSIS — I10 Essential (primary) hypertension: Secondary | ICD-10-CM

## 2023-01-10 DIAGNOSIS — I5042 Chronic combined systolic (congestive) and diastolic (congestive) heart failure: Secondary | ICD-10-CM

## 2023-01-10 NOTE — Patient Instructions (Signed)
Medication Instructions:  No change  *If you need a refill on your cardiac medications before your next appointment, please call your pharmacy*   Lab Work: None  If you have labs (blood work) drawn today and your tests are completely normal, you will receive your results only by: MyChart Message (if you have MyChart) OR A paper copy in the mail If you have any lab test that is abnormal or we need to change your treatment, we will call you to review the results.   Testing/Procedures: Your physician has requested that you have an echocardiogram. Echocardiography is a painless test that uses sound waves to create images of your heart. It provides your doctor with information about the size and shape of your heart and how well your heart's chambers and valves are working. This procedure takes approximately one hour. There are no restrictions for this procedure. Please do NOT wear cologne, perfume, aftershave, or lotions (deodorant is allowed). Please arrive 15 minutes prior to your appointment time.    Follow-Up: At [facility], you and your health needs are our priority.  As part of our continuing mission to provide you with exceptional heart care, we have created designated Provider Care Teams.  These Care Teams include your primary Cardiologist (physician) and Advanced Practice Providers (APPs -  Physician Assistants and Nurse Practitioners) who all work together to provide you with the care you need, when you need it.  We recommend signing up for the patient portal called "MyChart".  Sign up information is provided on this After Visit Summary.  MyChart is used to connect with patients for Virtual Visits (Telemedicine).  Patients are able to view lab/test results, encounter notes, upcoming appointments, etc.  Non-urgent messages can be sent to your provider as well.   To learn more about what you can do with MyChart, go to [email or web address].    Your next appointment:   6  month(s)  Provider:   Chrystie Nose, MD     Other Instructions None

## 2023-01-10 NOTE — Progress Notes (Signed)
OFFICE NOTE  Chief Complaint:  Follow-up  Primary Care Physician: Marylen Ponto, MD  HPI:  Bridget [name]  is a 82 year old female I have been seeing annually with a history of fatigue in the past. Actually when I saw her the last time she was short of breath only walking across the kitchen. We did undergo a significant workup which was basically normal. She returns today and says those symptoms have completely resolved and is unclear what they were due to. Unfortunately in February as you know she had a left hip replacement and is due for right knee replacement sometime in the near future. Denies any chest pain, worsening shortness of breath, palpitations, presyncope or syncopal symptoms.  Her main complaint is arthritis which is now developing in her left hip and knee. She's been hesitant to take meloxicam due to concerns about worsening heart disease. She reported she had cholesterol testing several months ago and is due for this again. The only other change is that she was noted to be taken off of amlodipine and switched to benazepril. She's not sure that that works as well for her blood pressure, but it does appear well-controlled today 122/82.  This was due to lower extremity swelling.  I saw Bridget [name] back in the office today. She's been having some problems with recurrent headaches. She's currently go to headache clinic and has been weaned off of all NSAIDs. She denies any chest pain or worsening shortness of breath. Her blood pressure control is been excellent. She's not had reassessment of her cholesterol to my knowledge since her last study in 2014.  Bridget [name] returns today for follow-up. She is being seen Dr. Jeral Fruit for back pain. She is on gabapentin 3 times daily but continues to have neuropathic symptoms. She also takes Percocet for pain. Blood pressure is well-controlled today. She denies any chest pain or worsening shortness of breath. EKG shows normal sinus  rhythm.  04/14/2016  Bridget [name] was seen today in annual follow-up. She has a number of concerns today including recurrent migraine headaches. She says they have improved somewhat with treatment by a neurologist however she continues to have problems with them. She also has labile blood pressures. When asked more questions about this it seems that she tends to adjust her medication doses based on what her blood pressure is. She may take her blood pressure for 5 times a day and if she feels like the blood pressures too low she may take less than the prescribed dose of medication or perhaps take additional medication if the blood pressure is too high. Given the long-term effects of these medicines which are both 16-24 hour half-lives, this strategy is probably causing her to have the high and low blood pressures that she seeing. Today her blood pressure was 98/62 and she is slightly presyncopal with this.  05/01/2017  Bridget [name] returns today for follow-up.  Overall she is doing well.  Her blood pressure is excellent today at 122/78.  Since she fell straight of the twice daily carvedilol and the dose was reduced and placed on long-acting carvedilol 20 mg daily.  She remains on irbesartan.  She denies any chest pain or palpitations.  Cholesterol was assessed last summer showed total cholesterol 158, HDL 48, LDL 77 and triglycerides 167.  05/18/2018  Bridget [name] was seen today in annual follow-up.  Overall she is doing well without any new complaints.  She was concerned that there were 2 different beta-blockers on her med  list.  Apparently a long-acting carvedilol as well as a short acting carvedilol.  She says she was having problems with low blood pressure and decreased her carvedilol from 6.25 mg twice daily to once daily.  I believe she was offered the long-acting carvedilol but never really took it.  In addition she had decreased her rosuvastatin to 10 mg daily.  She has not had recent lipids.  Blood pressure  is well controlled today.  04/30/2019  Bridget [name] returns for follow-up which is routine.  She says for the past several weeks to up to 2 months she has had some worsening fatigue, palpitations and shortness of breath.  She spoke with her husband who suggested an earlier appointment however she decided to wait for her routine visit.  She is noted to be tachycardic today and an EKG demonstrated she is in atrial flutter with 2:1 conduction.  This is a new finding for her.  She has no history of A. fib or flutter.  She does have a history of hypothyroidism on levothyroxine.  She reports some fatigue and shortness of breath.  There is some trace ankle edema.  No complaints of chest pain.  05/30/2019  Bridget [name] is seen today in follow-up.  Finally we have achieved and maintained sinus rhythm on amiodarone.  She had a repeat echo on March 9 which showed normalization of LV function trivial mitral regurgitation and mild to moderate aortic regurgitation with a mildly dilated ascending aorta at 4.1 cm.  We will continue to follow this clinically likely with a repeat echo in 1 year.  She does have a follow-up with Dr. Johney Frame to evaluate possible ablation although this may not be pursued given the fact that she has had normalization of her LV function.  She does report some persistent fatigue.  I wonder if she possibly could be over diuresed on Lasix.  I advised her to discontinue that and use it as needed for weight gain or swelling.  I would also recommend decreasing her amiodarone to 200 mg daily.  12/04/2019  Bridget [name] returns today for follow-up.  She seems to be maintaining sinus rhythm on amiodarone.  Recently she saw Dr. Johney Frame who feels that she may be a candidate for ablation.  He is scheduled her for A. fib ablation on September 28.  She had many questions today including about how long she may need to take amiodarone.  I wonder if she may actually stay on this after the procedure due to the risk of  ERAF.  01/10/2023  Bridget [name] is seen today in follow-up with her daughter.  Unfortunately she was just hospitalized for UTI with encephalopathy.  She was apparently not septic but had recurrent A-fib.  We have taken her off of amiodarone due to some concerns about not feeling well on it.  However she had to be started back on it.  She is currently on 200 mg daily.  She seems to be in good rhythm today.  Blood pressure is normal although she has been holding her irbesartan for the past week as it was noted she was hypotensive with systolics in the 90s by her physical therapist.  She has a follow-up with her PCP on this.  The etiology of the hypotension is not clear.  She is no longer having any urinary symptoms although did not initially have any and notes that her urine has been clear.  PMHx:  Past Medical History:  Diagnosis Date   Allergy  Arthritis    hip OA   Degenerative disc disease    GERD (gastroesophageal reflux disease)    Hyperlipidemia    Hypertension    Hypothyroidism    Migraines    Neuromuscular disorder (HCC)    Neuropathy    Nonischemic cardiomyopathy (HCC)    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    Past Surgical History:  Procedure Laterality Date   ATRIAL FIBRILLATION ABLATION N/A 12/16/2019   Procedure: ATRIAL FIBRILLATION ABLATION;  Surgeon: Hillis Range, MD;  Location: [facility] INVASIVE CV LAB;  Service: Cardiovascular;  Laterality: N/A;   CARDIOVERSION N/A 05/02/2019   Procedure: CARDIOVERSION;  Surgeon: Chrystie Nose, MD;  Location: [facility] ENDOSCOPY;  Service: Cardiovascular;  Laterality: N/A;   CARDIOVERSION N/A 05/14/2019   Procedure: CARDIOVERSION;  Surgeon: Parke Poisson, MD;  Location: [facility] ENDOSCOPY;  Service: Cardiovascular;  Laterality: N/A;   CATARACT  EXTRACTION, BILATERAL     CERVICAL DISCECTOMY  2005   with fusion   CHOLECYSTECTOMY  2006   COLONOSCOPY     LUMBAR LAMINECTOMY/DECOMPRESSION MICRODISCECTOMY Right 11/13/2014   Procedure: Right Lumbar four-five Microdiskectomy;  Surgeon: Hilda Lias, MD;  Location: [facility] NEURO ORS;  Service: Neurosurgery;  Laterality: Right;  right   POLYPECTOMY     REPLACEMENT TOTAL KNEE Right 05/2012   TEE WITHOUT CARDIOVERSION N/A 05/02/2019   Procedure: TRANSESOPHAGEAL ECHOCARDIOGRAM (TEE);  Surgeon: Chrystie Nose, MD;  Location: [facility] ENDOSCOPY;  Service: Cardiovascular;  Laterality: N/A;   TOTAL ABDOMINAL HYSTERECTOMY  1978   TOTAL HIP ARTHROPLASTY Left 04/2011   TRANSTHORACIC ECHOCARDIOGRAM  12/2008   EF=>55%, borderline conc LVH; trace MR; mod TR; trace AV regurg    FAMHx:  Family History  Problem Relation Age of Onset   Stroke Mother 45   Coronary artery disease Father 23   Colon polyps Father    Colon cancer Cousin    Esophageal cancer Cousin    Stroke Maternal Grandfather    Stroke Paternal Grandmother    Breast cancer Paternal Grandmother    Suicidality Daughter    Rectal cancer Neg Hx    Stomach cancer Neg Hx    Tremor Neg Hx    Parkinson's disease Neg Hx    Alzheimer's disease Neg Hx    Dementia Neg Hx     SOCHx:   reports that she has never smoked. She has never used smokeless tobacco. She reports that she does not drink alcohol and does not use drugs.  ALLERGIES:  Allergies  Allergen Reactions   Codeine Anaphylaxis    Other Reaction(s): GI Intolerance   Sulfonamide Derivatives Anaphylaxis   Triptans Other (See Comments)    Stopped blood supply to mesenteric artery per pt  Other Reaction(s): Other (See Comments)   Amlodipine Other (See Comments)    swelling   Chocolate Other (See Comments)    Headache   Onion Other (See Comments)    Headache   Other     MSG- headaches    ROS: Pertinent items noted in HPI and remainder of comprehensive ROS otherwise  negative.  HOME MEDS: Current Outpatient Medications  Medication Sig Dispense Refill   amiodarone (PACERONE) 200 MG tablet Take 1 tablet (200 mg total) by mouth daily. 30 tablet 0   baclofen (LIORESAL) 10 MG tablet Take 0.5 tablets by mouth  as needed (as needed for migraine pain and neuropathy.).      Dextran 70-Hypromellose (ARTIFICIAL TEARS PF OP) Place 1 drop into both eyes daily as needed (For dry eyes).     diltiazem (CARDIZEM CD) 240 MG 24 hr capsule TAKE 1 CAPSULE BY MOUTH EVERY DAY 90 capsule 3   diltiazem (CARDIZEM) 30 MG tablet Take 2 tablets by mouth every 6 hours as needed for palpitations. 180 tablet 1   ELIQUIS 5 MG TABS tablet TAKE 1 TABLET BY MOUTH TWICE A DAY 180 tablet 1   furosemide (LASIX) 20 MG tablet Take 2 tablets (40 mg total) by mouth daily as needed for edema (weight gain and/or swelling). 180 tablet 3   irbesartan (AVAPRO) 75 MG tablet Take 1 tablet (75 mg total) by mouth daily. 90 tablet 1   levothyroxine (SYNTHROID, LEVOTHROID) 75 MCG tablet Take 75 mcg by mouth daily before breakfast.     memantine (NAMENDA) 5 MG tablet Take 5 mg by mouth 2 (two) times daily.     potassium chloride SA (KLOR-CON M20) 20 MEQ tablet TAKE 1 TABLET (20 MEQ TOTAL) BY MOUTH AS NEEDED (TAKE ON DAYS LASIX IS TAKEN). 90 tablet 2   rosuvastatin (CRESTOR) 20 MG tablet Take 1 tablet (20 mg total) by mouth daily. 90 tablet 0   sertraline (ZOLOFT) 25 MG tablet Take 25 mg by mouth daily.     traMADol (ULTRAM) 50 MG tablet Take 50-100 mg by mouth every 4 (four) hours as needed for moderate pain (as needed for migraine pain and neuropathy.).      No current facility-administered medications for this visit.    LABS/IMAGING: No results found for this or any previous visit (from the past 48 hour(s)). No results found.  VITALS: BP 132/84   Pulse 83   Ht 5\' 7"  (1.702 m)   Wt 158 lb (71.7 kg)   LMP  (LMP Unknown)   SpO2 99%   BMI 24.75 kg/m   EXAM: General appearance: alert and no  distress Neck: no carotid bruit and no JVD Lungs: clear to auscultation bilaterally Heart: regular rate and rhythm, S1, S2 normal, no murmur, click, rub or gallop and no rub Abdomen: soft, non-tender; bowel sounds normal; no masses,  no organomegaly Extremities: extremities normal, atraumatic, no cyanosis or edema Pulses: 2+ and symmetric Skin: Pale, cool, dry Neurologic: Grossly normal Psych: Pleasant  EKG: Deferred  ASSESSMENT: Paroxysmal atrial flutter with 2-1 AV conduction, s/p DCCV on amiodarone 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   Ms. [name] was recently hospitalized for UTI with encephalopathy.  This is cleared.  It is surprising that she was not symptomatic other than confusion.  Since then she has been hypotensive.  She does have a history of tachycardia mediated cardiomyopathy in the past and was in rapid A-fib during her hospitalization.  Since she has now been hypotensive, I wonder if her LVEF is dropped.  Will go ahead and repeat an echocardiogram.  Continue Eliquis, amiodarone and diltiazem.  Hold irbesartan until follow-up.  She can follow-up with me in 6 months or sooner as necessary.  Chrystie Nose, MD, [facility], FACP  [place]  [facility]  Medical Director of the [facility]  [facility] Diplomate of the American Board of Clinical Lipidology Attending Cardiologist  Direct Dial: [phone]  Fax: [phone]  Website:  [email or web address]  Chrystie Nose 01/10/2023,  9:26 AM

## 2023-01-11 DIAGNOSIS — Z23 Encounter for immunization: Secondary | ICD-10-CM | POA: Diagnosis not present

## 2023-01-11 DIAGNOSIS — I4891 Unspecified atrial fibrillation: Secondary | ICD-10-CM | POA: Diagnosis not present

## 2023-01-11 DIAGNOSIS — Z6823 Body mass index (BMI) 23.0-23.9, adult: Secondary | ICD-10-CM | POA: Diagnosis not present

## 2023-01-11 DIAGNOSIS — R4182 Altered mental status, unspecified: Secondary | ICD-10-CM | POA: Diagnosis not present

## 2023-01-20 ENCOUNTER — Telehealth: Payer: Self-pay | Admitting: Cardiology

## 2023-01-20 MED ORDER — AMIODARONE HCL 200 MG PO TABS: 200.0000 mg | ORAL_TABLET | Freq: Every day | ORAL | 3 refills | Status: DC

## 2023-01-20 NOTE — Telephone Encounter (Signed)
Outpatient service line: CC: Amiodarone refill  Recently seen and only discharged on 30-day supply of amiodarone.  Patient's daughter now requesting refill.  I will send her prescription to [facility] in [place].  Sending amiodarone 200 mg daily.

## 2023-02-02 ENCOUNTER — Other Ambulatory Visit: Payer: Self-pay | Admitting: Internal Medicine

## 2023-02-09 ENCOUNTER — Other Ambulatory Visit (HOSPITAL_COMMUNITY): Payer: Medicare HMO

## 2023-03-18 ENCOUNTER — Inpatient Hospital Stay (HOSPITAL_COMMUNITY)
Admission: EM | Admit: 2023-03-18 | Discharge: 2023-03-20 | DRG: 391 | Disposition: A | Discharge status: Home Health | Payer: Medicare HMO | Attending: Family Medicine | Admitting: Family Medicine

## 2023-03-18 ENCOUNTER — Other Ambulatory Visit: Payer: Self-pay

## 2023-03-18 DIAGNOSIS — R112 Nausea with vomiting, unspecified: Secondary | ICD-10-CM | POA: Diagnosis present

## 2023-03-18 DIAGNOSIS — E039 Hypothyroidism, unspecified: Secondary | ICD-10-CM | POA: Diagnosis present

## 2023-03-18 DIAGNOSIS — S0083XA Contusion of other part of head, initial encounter: Secondary | ICD-10-CM | POA: Diagnosis present

## 2023-03-18 DIAGNOSIS — D696 Thrombocytopenia, unspecified: Secondary | ICD-10-CM | POA: Diagnosis present

## 2023-03-18 DIAGNOSIS — F0390 Unspecified dementia without behavioral disturbance: Secondary | ICD-10-CM | POA: Diagnosis present

## 2023-03-18 DIAGNOSIS — I480 Paroxysmal atrial fibrillation: Secondary | ICD-10-CM | POA: Diagnosis present

## 2023-03-18 DIAGNOSIS — K219 Gastro-esophageal reflux disease without esophagitis: Secondary | ICD-10-CM | POA: Diagnosis present

## 2023-03-18 DIAGNOSIS — E878 Other disorders of electrolyte and fluid balance, not elsewhere classified: Secondary | ICD-10-CM | POA: Diagnosis present

## 2023-03-18 DIAGNOSIS — Z9841 Cataract extraction status, right eye: Secondary | ICD-10-CM

## 2023-03-18 DIAGNOSIS — Z7901 Long term (current) use of anticoagulants: Secondary | ICD-10-CM

## 2023-03-18 DIAGNOSIS — Z90710 Acquired absence of both cervix and uterus: Secondary | ICD-10-CM

## 2023-03-18 DIAGNOSIS — Z9049 Acquired absence of other specified parts of digestive tract: Secondary | ICD-10-CM

## 2023-03-18 DIAGNOSIS — Z8249 Family history of ischemic heart disease and other diseases of the circulatory system: Secondary | ICD-10-CM

## 2023-03-18 DIAGNOSIS — A084 Viral intestinal infection, unspecified: Secondary | ICD-10-CM | POA: Diagnosis not present

## 2023-03-18 DIAGNOSIS — R251 Tremor, unspecified: Secondary | ICD-10-CM | POA: Diagnosis present

## 2023-03-18 DIAGNOSIS — I4819 Other persistent atrial fibrillation: Secondary | ICD-10-CM | POA: Diagnosis present

## 2023-03-18 DIAGNOSIS — F03A3 Unspecified dementia, mild, with mood disturbance: Secondary | ICD-10-CM | POA: Diagnosis present

## 2023-03-18 DIAGNOSIS — Z91018 Allergy to other foods: Secondary | ICD-10-CM

## 2023-03-18 DIAGNOSIS — E870 Hyperosmolality and hypernatremia: Secondary | ICD-10-CM | POA: Diagnosis present

## 2023-03-18 DIAGNOSIS — R350 Frequency of micturition: Secondary | ICD-10-CM | POA: Diagnosis present

## 2023-03-18 DIAGNOSIS — R8271 Bacteriuria: Secondary | ICD-10-CM | POA: Diagnosis present

## 2023-03-18 DIAGNOSIS — G9341 Metabolic encephalopathy: Secondary | ICD-10-CM | POA: Diagnosis present

## 2023-03-18 DIAGNOSIS — Z885 Allergy status to narcotic agent status: Secondary | ICD-10-CM

## 2023-03-18 DIAGNOSIS — F32A Depression, unspecified: Secondary | ICD-10-CM | POA: Diagnosis present

## 2023-03-18 DIAGNOSIS — Z96642 Presence of left artificial hip joint: Secondary | ICD-10-CM | POA: Diagnosis present

## 2023-03-18 DIAGNOSIS — Z9842 Cataract extraction status, left eye: Secondary | ICD-10-CM

## 2023-03-18 DIAGNOSIS — Z800 Family history of malignant neoplasm of digestive organs: Secondary | ICD-10-CM

## 2023-03-18 DIAGNOSIS — E860 Dehydration: Secondary | ICD-10-CM | POA: Diagnosis not present

## 2023-03-18 DIAGNOSIS — Z85828 Personal history of other malignant neoplasm of skin: Secondary | ICD-10-CM

## 2023-03-18 DIAGNOSIS — Z83719 Family history of colon polyps, unspecified: Secondary | ICD-10-CM

## 2023-03-18 DIAGNOSIS — E785 Hyperlipidemia, unspecified: Secondary | ICD-10-CM | POA: Diagnosis present

## 2023-03-18 DIAGNOSIS — Z79899 Other long term (current) drug therapy: Secondary | ICD-10-CM

## 2023-03-18 DIAGNOSIS — I10 Essential (primary) hypertension: Secondary | ICD-10-CM | POA: Diagnosis present

## 2023-03-18 DIAGNOSIS — R4182 Altered mental status, unspecified: Secondary | ICD-10-CM | POA: Diagnosis not present

## 2023-03-18 DIAGNOSIS — I428 Other cardiomyopathies: Secondary | ICD-10-CM | POA: Diagnosis present

## 2023-03-18 DIAGNOSIS — Z882 Allergy status to sulfonamides status: Secondary | ICD-10-CM

## 2023-03-18 DIAGNOSIS — Y92009 Unspecified place in unspecified non-institutional (private) residence as the place of occurrence of the external cause: Secondary | ICD-10-CM

## 2023-03-18 DIAGNOSIS — Z96651 Presence of right artificial knee joint: Secondary | ICD-10-CM | POA: Diagnosis present

## 2023-03-18 DIAGNOSIS — Z79890 Hormone replacement therapy (postmenopausal): Secondary | ICD-10-CM

## 2023-03-18 DIAGNOSIS — R300 Dysuria: Secondary | ICD-10-CM | POA: Diagnosis present

## 2023-03-18 DIAGNOSIS — Z888 Allergy status to other drugs, medicaments and biological substances status: Secondary | ICD-10-CM

## 2023-03-18 DIAGNOSIS — W19XXXA Unspecified fall, initial encounter: Secondary | ICD-10-CM | POA: Diagnosis present

## 2023-03-18 DIAGNOSIS — Z860101 Personal history of adenomatous and serrated colon polyps: Secondary | ICD-10-CM

## 2023-03-18 DIAGNOSIS — S0990XA Unspecified injury of head, initial encounter: Secondary | ICD-10-CM

## 2023-03-18 DIAGNOSIS — G9340 Encephalopathy, unspecified: Principal | ICD-10-CM | POA: Diagnosis present

## 2023-03-18 NOTE — ED Triage Notes (Signed)
Pt BIB EMS from home family concerned for possible UTI for increase in urinary frequency. States she was "acting different" this morning. Pt visibly altered in triage, falling asleep during conversation. EMS unsure of baseline.

## 2023-03-18 NOTE — ED Notes (Signed)
Pt alert and oriented to self. Pt disoriented to time, place, and situation. When asked questions, patient repeats her name. Respirations regular and even.

## 2023-03-18 NOTE — ED Provider Notes (Incomplete)
[place] EMERGENCY DEPARTMENT AT [facility] Provider Note  CSN: [id number] Arrival date & time: 03/18/23 2304  Chief Complaint(s) Urinary Frequency  HPI Bridget [name] is a 82 y.o. female with past medical history as below, significant for nonischemic cardiomyopathy, afib on DOAC, hypothyroid, htn, ddd who presents to the ED with complaint of ams.  Pt here w/ daughter, reports ams over last 24 hours, LKN last night at bedtime (>24 hours PTA). More confused and sleepy today. Increased urine frequency / dysuria. Some vomiting and nausea, took phenergan which helped. Had a fall around 2000 this evening w/ head injury, she is on doac. Larey Seat with spouse. Unsure if LOC, no seizure activity reported. Head injury was reported, bruise to forehead. Has been ambulatory with family assistance today, normally uses a cane. Similar symptoms a/w UTI previously/ sepsis.   Past Medical History 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   Patient Active Problem List   Diagnosis Date Noted   Acute cystitis without hematuria 12/17/2022   Sepsis with encephalopathy without septic shock (HCC) 12/17/2022   Acute encephalopathy 12/16/2022   Nonischemic cardiomyopathy (HCC)    Persistent atrial fibrillation (HCC) 07/10/2019   Back pain 04/18/2015   Lumbar herniated disc 11/13/2014   Peripheral polyneuropathy 07/30/2013   Migraine headache 07/30/2013   Chronic neck pain 07/10/2013   ADENOMATOUS COLONIC  POLYP 11/12/2008   Hypothyroidism 08/22/2007   Lumbar radiculopathy 08/22/2007   Disorder of joint 08/22/2007   Arthropathia 08/22/2007   Hyperlipidemia 08/21/2007   Hypertension 08/21/2007   GERD (gastroesophageal reflux disease) 08/21/2007   Diverticulosis of large intestine 08/21/2007   Headache 08/21/2007   History of colonic polyps 08/21/2007   Home Medication(s) Prior to Admission medications   Medication Sig Start Date End Date Taking? Authorizing Provider  amiodarone (PACERONE) 200 MG tablet Take 1 tablet (200 mg total) by mouth daily. 01/20/23  Yes Yvonna Alanis L, PA-C  baclofen (LIORESAL) 10 MG tablet Take 0.5 tablets by mouth as needed (as needed for migraine pain and neuropathy.).  08/23/19  Yes [provider]  Dextran 70-Hypromellose (ARTIFICIAL TEARS PF OP) Place 1 drop into both eyes daily as needed (For dry eyes).   Yes [provider]  diltiazem (CARDIZEM CD) 240 MG 24 hr capsule TAKE 1 CAPSULE BY MOUTH EVERY DAY 10/25/22  Yes Hilty, Lisette Abu, MD  levothyroxine (SYNTHROID, LEVOTHROID) 75 MCG tablet Take 75 mcg by mouth daily before breakfast.   Yes [provider]  Menthol, Topical Analgesic, (BIOFREEZE COOL THE PAIN) 4 % GEL Apply 1 Application topically as needed (neck pain).   Yes [provider]  potassium chloride SA (KLOR-CON M20) 20 MEQ tablet TAKE 1 TABLET (20 MEQ TOTAL) BY MOUTH AS NEEDED (TAKE ON DAYS LASIX IS TAKEN). Patient taking differently: Take 20 mEq by mouth at bedtime. 08/18/21  Yes Hilty, Lisette Abu, MD  primidone (MYSOLINE) 50 MG tablet Take 50 mg  by mouth at bedtime. 03/02/23  Yes [provider]  rosuvastatin (CRESTOR) 20 MG tablet TAKE 1 TABLET BY MOUTH EVERY DAY Patient taking differently: Take 20 mg by mouth at bedtime. 02/02/23  Yes Hilty, Lisette Abu, MD  sertraline (ZOLOFT) 25 MG tablet Take 25 mg by mouth at bedtime.   Yes [provider]  traMADol (ULTRAM) 50 MG tablet Take 50-100 mg by mouth  every 4 (four) hours as needed for moderate pain (as needed for migraine pain and neuropathy.).  03/09/16  Yes [provider]  ELIQUIS 5 MG TABS tablet TAKE 1 TABLET BY MOUTH TWICE A DAY 11/08/22   Hilty, Lisette Abu, MD                                                                                                                                    Past Surgical History Past Surgical History:  Procedure Laterality Date   ATRIAL FIBRILLATION ABLATION N/A 12/16/2019   Procedure: ATRIAL FIBRILLATION ABLATION;  Surgeon: Hillis Range, MD;  Location: [facility] INVASIVE CV LAB;  Service: Cardiovascular;  Laterality: N/A;   CARDIOVERSION N/A 05/02/2019   Procedure: CARDIOVERSION;  Surgeon: Chrystie Nose, MD;  Location: [facility] ENDOSCOPY;  Service: Cardiovascular;  Laterality: N/A;   CARDIOVERSION N/A 05/14/2019   Procedure: CARDIOVERSION;  Surgeon: Parke Poisson, MD;  Location: [facility] ENDOSCOPY;  Service: Cardiovascular;  Laterality: N/A;   CATARACT EXTRACTION, BILATERAL     CERVICAL DISCECTOMY  2005   with fusion   CHOLECYSTECTOMY  2006   COLONOSCOPY     LUMBAR LAMINECTOMY/DECOMPRESSION MICRODISCECTOMY Right 11/13/2014   Procedure: Right Lumbar four-five Microdiskectomy;  Surgeon: Hilda Lias, MD;  Location: [facility] NEURO ORS;  Service: Neurosurgery;  Laterality: Right;  right   POLYPECTOMY     REPLACEMENT TOTAL KNEE Right 05/2012   TEE WITHOUT CARDIOVERSION N/A 05/02/2019   Procedure: TRANSESOPHAGEAL ECHOCARDIOGRAM (TEE);  Surgeon: Chrystie Nose, MD;  Location[facility] ENDOSCOPY;  Service: Cardiovascular;  Laterality: N/A;   TOTAL ABDOMINAL HYSTERECTOMY  1978   TOTAL HIP ARTHROPLASTY Left 04/2011   TRANSTHORACIC ECHOCARDIOGRAM  12/2008   EF=>55%, borderline conc LVH; trace MR; mod TR; trace AV regurg   Family History Family History  Problem Relation Age of Onset   Stroke Mother 22   Coronary artery disease Father 27   Colon polyps Father    Colon cancer Cousin    Esophageal cancer Cousin     Stroke Maternal Grandfather    Stroke Paternal Grandmother    Breast cancer Paternal Grandmother    Suicidality Daughter    Rectal cancer Neg Hx    Stomach cancer Neg Hx    Tremor Neg Hx    Parkinson's disease Neg Hx    Alzheimer's disease Neg Hx    Dementia Neg Hx     Social History Social History   Tobacco Use   Smoking status: Never   Smokeless tobacco: Never  Vaping Use  Vaping status: Never Used  Substance Use Topics   Alcohol use: No   Drug use: No   Allergies Codeine, Sulfonamide derivatives, Triptans, Amlodipine, Chocolate, Onion, and Other  Review of Systems Review of Systems  Unable to perform ROS: Mental status change  Constitutional:  Positive for appetite change. Negative for chills and fever.  Respiratory:  Negative for shortness of breath.   Cardiovascular:  Negative for chest pain.  Gastrointestinal:  Positive for abdominal pain and vomiting.  Neurological:  Positive for weakness.  Psychiatric/Behavioral:  Positive for confusion.     Physical Exam Vital Signs  I have reviewed the triage vital signs BP (!) 108/53   Pulse (!) 51   Temp 98.3 F (36.8 C) (Oral)   Resp 16   Ht 5\' 7"  (1.702 m)   Wt 71.7 kg   LMP  (LMP Unknown)   SpO2 97%   BMI 24.76 kg/m  Physical Exam Vitals and nursing note reviewed.  Constitutional:      General: She is not in acute distress.    Appearance: Normal appearance.     Comments: pale  HENT:     Head: Normocephalic.     Jaw: There is normal jaw occlusion. No trismus.      Comments: Hematoma R frontal/supra orbital area    Right Ear: External ear normal.     Left Ear: External ear normal.     Nose: Nose normal.     Mouth/Throat:     Mouth: Mucous membranes are dry.  Eyes:     General: No scleral icterus.       Right eye: No discharge.        Left eye: No discharge.     Extraocular Movements: Extraocular movements intact.     Pupils: Pupils are equal, round, and reactive to light.  Cardiovascular:      Rate and Rhythm: Regular rhythm. Bradycardia present.     Pulses: Normal pulses.     Heart sounds: Normal heart sounds.  Pulmonary:     Effort: Pulmonary effort is normal. No respiratory distress.     Breath sounds: Normal breath sounds. No stridor.  Abdominal:     General: Abdomen is flat. There is no distension.     Palpations: Abdomen is soft.     Tenderness: There is abdominal tenderness.  Musculoskeletal:     Cervical back: No rigidity.     Right lower leg: No edema.     Left lower leg: No edema.     Comments: Pelvis stable to AP pressure  No sig pain w/ log roll  Brisk pulses to extremities  Skin:    General: Skin is warm and dry.     Capillary Refill: Capillary refill takes less than 2 seconds.  Neurological:     Mental Status: She is alert. She is disoriented and confused.     GCS: GCS eye subscore is 4. GCS verbal subscore is 4. GCS motor subscore is 6.     Cranial Nerves: Cranial nerves 2-12 are intact. No facial asymmetry.     Sensory: Sensation is intact. No sensory deficit.     Motor: Motor function is intact. No weakness.     Coordination: Coordination is intact.     Comments: Sleepy but arouses to verbal stimulus   Gait testing deferred secondary to patient safety.  Strength 5/5 to BLUE/BLLE, equal and symmetric     Psychiatric:        Mood and Affect: Mood normal.  Behavior: Behavior normal. Behavior is cooperative.     ED Results and Treatments Labs (all labs ordered are listed, but only abnormal results are displayed) Labs Reviewed  CBC WITH DIFFERENTIAL/PLATELET - Abnormal; Notable for the following components:      Result Value   Neutro Abs 7.9 (*)    All other components within normal limits  COMPREHENSIVE METABOLIC PANEL - Abnormal; Notable for the following components:   Chloride 113 (*)    CO2 21 (*)    Glucose, Bld 118 (*)    Creatinine, Ser 1.39 (*)    Total Protein 6.3 (*)    Albumin 3.3 (*)    GFR, Estimated 38 (*)    All  other components within normal limits  URINALYSIS, ROUTINE W REFLEX MICROSCOPIC - Abnormal; Notable for the following components:   Color, Urine STRAW (*)    Hgb urine dipstick MODERATE (*)    All other components within normal limits  ACETAMINOPHEN LEVEL - Abnormal; Notable for the following components:   Acetaminophen (Tylenol), Serum <10 (*)    All other components within normal limits  SALICYLATE LEVEL - Abnormal; Notable for the following components:   Salicylate Lvl <7.0 (*)    All other components within normal limits  T4, FREE - Abnormal; Notable for the following components:   Free T4 1.22 (*)    All other components within normal limits  BRAIN NATRIURETIC PEPTIDE - Abnormal; Notable for the following components:   B Natriuretic Peptide 175.0 (*)    All other components within normal limits  I-STAT VENOUS BLOOD GAS, ED - Abnormal; Notable for the following components:   pCO2, Ven 38.4 (*)    All other components within normal limits  I-STAT CHEM 8, ED - Abnormal; Notable for the following components:   Chloride 113 (*)    Creatinine, Ser 1.40 (*)    Glucose, Bld 116 (*)    All other components within normal limits  RESP PANEL BY RT-PCR (RSV, FLU A&B, COVID)  RVPGX2  LIPASE, BLOOD  AMMONIA  CK  TSH  RAPID URINE DRUG SCREEN, HOSP PERFORMED  CBC WITH DIFFERENTIAL/PLATELET  COMPREHENSIVE METABOLIC PANEL  MAGNESIUM  I-STAT CG4 LACTIC ACID, ED  I-STAT CG4 LACTIC ACID, ED                                                                                                                          Radiology CT ABDOMEN PELVIS W CONTRAST Result Date: 03/19/2023 CLINICAL DATA:  Acute abdominal pain. EXAM: CT ABDOMEN AND PELVIS WITH CONTRAST TECHNIQUE: Multidetector CT imaging of the abdomen and pelvis was performed using the standard protocol following bolus administration of intravenous contrast. RADIATION DOSE REDUCTION: This exam was performed according to the departmental  dose-optimization program which includes automated exposure control, adjustment of the mA and/or kV according to patient size and/or use of iterative reconstruction technique. CONTRAST:  75mL OMNIPAQUE IOHEXOL 350 MG/ML SOLN COMPARISON:  Remote CT 1123 L5 FINDINGS: Lower chest: No  acute findings allowing for motion artifact. Hepatobiliary: Subcapsular cysts in the left lobe of the liver, largest measuring 18 mm. No suspicious liver lesion. Prior cholecystectomy. There is intra and extrahepatic biliary ductal dilatation. The common bile duct measures 19 mm at the porta hepatis. No visualized choledocholithiasis. Pancreas: Fatty atrophy.  No ductal dilatation or inflammation. Spleen: Normal in size without focal abnormality. Adrenals/Urinary Tract: No adrenal nodule. No hydronephrosis. Homogeneous renal enhancement. No evidence of renal inflammation. No visible stone or focal lesion. Unremarkable urinary bladder. No bladder wall thickening. Stomach/Bowel: Decompressed stomach. No bowel obstruction or inflammation. Prominent sigmoid diverticulosis without focal diverticulitis. Small to moderate volume of stool in the colon. The appendix is not definitively seen, no appendicitis. Vascular/Lymphatic: Aortic atherosclerosis without aneurysm. The portal vein is patent. No acute vascular findings. No abdominopelvic adenopathy. Reproductive: Status post hysterectomy. No adnexal masses. Other: No free air or ascites. Diminutive fat containing umbilical hernia. Musculoskeletal: Left hip arthroplasty. Right hip osteoarthritis. Mild to moderate degenerative change throughout the spine. IMPRESSION: 1. Intra and extrahepatic biliary ductal dilatation, likely due to postcholecystectomy state. Recommend correlation with liver function tests. If LFTs are normal, no further evaluation is needed. If LFTs are elevated, MRCP is the study of choice, however should only be pursued if patient is able to tolerate breath hold technique. 2.  Sigmoid diverticulosis without diverticulitis. Aortic Atherosclerosis (ICD10-I70.0). Electronically Signed   By: Narda Rutherford M.D.   On: 03/19/2023 00:40   DG Chest Portable 1 View Result Date: 03/19/2023 CLINICAL DATA:  Altered mental status EXAM: PORTABLE CHEST 1 VIEW COMPARISON:  12/16/2022 FINDINGS: Cardiac shadow is stable. Aortic calcifications are noted. Lungs are well aerated bilaterally. No focal infiltrate or sizable effusion is seen. Postsurgical changes are noted in the cervical spine. IMPRESSION: No active disease. Electronically Signed   By: Alcide Clever M.D.   On: 03/19/2023 00:35   CT Head Wo Contrast Result Date: 03/19/2023 CLINICAL DATA:  Delirium EXAM: CT HEAD WITHOUT CONTRAST CT CERVICAL SPINE WITHOUT CONTRAST TECHNIQUE: Multidetector CT imaging of the head and cervical spine was performed following the standard protocol without intravenous contrast. Multiplanar CT image reconstructions of the cervical spine were also generated. RADIATION DOSE REDUCTION: This exam was performed according to the departmental dose-optimization program which includes automated exposure control, adjustment of the mA and/or kV according to patient size and/or use of iterative reconstruction technique. COMPARISON:  None Available. FINDINGS: CT HEAD FINDINGS Brain: There is no mass, hemorrhage or extra-axial collection. There is generalized atrophy without lobar predilection. Hypodensity of the white matter is most commonly associated with chronic microvascular disease. Vascular: Atherosclerotic calcification of the internal carotid arteries at the skull base. No abnormal hyperdensity of the major intracranial arteries or dural venous sinuses. Skull: The visualized skull base, calvarium and extracranial soft tissues are normal. Sinuses/Orbits: No fluid levels or advanced mucosal thickening of the visualized paranasal sinuses. No mastoid or middle ear effusion. Normal orbits. Other: None. CT CERVICAL SPINE  FINDINGS Alignment: No static subluxation. Facets are aligned. Occipital condyles are normally positioned. Skull base and vertebrae: No acute fracture.  C3-7 ACDF. Soft tissues and spinal canal: No prevertebral fluid or swelling. No visible canal hematoma. Disc levels: No advanced spinal canal or neural foraminal stenosis. Upper chest: No pneumothorax, pulmonary nodule or pleural effusion. Other: Normal visualized paraspinal cervical soft tissues. IMPRESSION: 1. No acute intracranial abnormality. 2. No acute fracture or static subluxation of the cervical spine. 3. C3-7 ACDF without hardware complication. Electronically Signed   By: Caryn Bee  Chase Picket M.D.   On: 03/19/2023 00:33   CT Cervical Spine Wo Contrast Result Date: 03/19/2023 CLINICAL DATA:  Delirium EXAM: CT HEAD WITHOUT CONTRAST CT CERVICAL SPINE WITHOUT CONTRAST TECHNIQUE: Multidetector CT imaging of the head and cervical spine was performed following the standard protocol without intravenous contrast. Multiplanar CT image reconstructions of the cervical spine were also generated. RADIATION DOSE REDUCTION: This exam was performed according to the departmental dose-optimization program which includes automated exposure control, adjustment of the mA and/or kV according to patient size and/or use of iterative reconstruction technique. COMPARISON:  None Available. FINDINGS: CT HEAD FINDINGS Brain: There is no mass, hemorrhage or extra-axial collection. There is generalized atrophy without lobar predilection. Hypodensity of the white matter is most commonly associated with chronic microvascular disease. Vascular: Atherosclerotic calcification of the internal carotid arteries at the skull base. No abnormal hyperdensity of the major intracranial arteries or dural venous sinuses. Skull: The visualized skull base, calvarium and extracranial soft tissues are normal. Sinuses/Orbits: No fluid levels or advanced mucosal thickening of the visualized paranasal sinuses. No  mastoid or middle ear effusion. Normal orbits. Other: None. CT CERVICAL SPINE FINDINGS Alignment: No static subluxation. Facets are aligned. Occipital condyles are normally positioned. Skull base and vertebrae: No acute fracture.  C3-7 ACDF. Soft tissues and spinal canal: No prevertebral fluid or swelling. No visible canal hematoma. Disc levels: No advanced spinal canal or neural foraminal stenosis. Upper chest: No pneumothorax, pulmonary nodule or pleural effusion. Other: Normal visualized paraspinal cervical soft tissues. IMPRESSION: 1. No acute intracranial abnormality. 2. No acute fracture or static subluxation of the cervical spine. 3. C3-7 ACDF without hardware complication. Electronically Signed   By: Deatra Robinson M.D.   On: 03/19/2023 00:33    Pertinent labs & imaging results that were available during my care of the patient were reviewed by me and considered in my medical decision making (see MDM for details).  Medications Ordered in ED Medications  acetaminophen (TYLENOL) tablet 650 mg (has no administration in time range)    Or  acetaminophen (TYLENOL) suppository 650 mg (has no administration in time range)  melatonin tablet 3 mg (has no administration in time range)  ondansetron (ZOFRAN) injection 4 mg (has no administration in time range)  iohexol (OMNIPAQUE) 350 MG/ML injection 75 mL (75 mLs Intravenous Contrast Given 03/19/23 0026)  sodium chloride 0.9 % bolus 1,000 mL (0 mLs Intravenous Stopped 03/19/23 0301)  ondansetron (ZOFRAN) injection 4 mg (4 mg Intravenous Given 03/19/23 0359)                                                                                                                                     Procedures .Critical Care  Performed by: Sloan Leiter, DO Authorized by: Sloan Leiter, DO   Critical care provider statement:    Critical care time (minutes):  32   Critical care time was exclusive of:  Separately billable procedures  and treating other  patients   Critical care was necessary to treat or prevent imminent or life-threatening deterioration of the following conditions:  Trauma and CNS failure or compromise   Critical care was time spent personally by me on the following activities:  Development of treatment plan with patient or surrogate, discussions with consultants, evaluation of patient's response to treatment, examination of patient, ordering and review of laboratory studies, ordering and review of radiographic studies, ordering and performing treatments and interventions, pulse oximetry, re-evaluation of patient's condition and review of old charts   (including critical care time)  Medical Decision Making / ED Course    Medical Decision Making:    TAMEKO WHITLOW is a 82 y.o. female with past medical history as below, significant for nonischemic cardiomyopathy, afib on DOAC, hypothyroid, htn, ddd who presents to the ED with complaint of ams.. The complaint involves an extensive differential diagnosis and also carries with it a high risk of complications and morbidity.  Serious etiology was considered. Ddx includes but is not limited to: Differential diagnoses for altered mental status includes but is not exclusive to alcohol, illicit or prescription medications, intracranial pathology such as stroke, intracerebral hemorrhage, fever or infectious causes including sepsis, hypoxemia, uremia, trauma, endocrine related disorders such as diabetes, hypoglycemia, thyroid-related diseases, etc. Differential diagnoses for head trauma includes subdural hematoma, epidural hematoma, acute concussion, traumatic subarachnoid hemorrhage, cerebral contusions, etc.   Complete initial physical exam performed, notably the patient was in NAD.    Reviewed and confirmed nursing documentation for past medical history, family history, social history.  Vital signs reviewed.    FOT w/ head injury, AMS, level 2 trauma was activated   Clinical Course as  of 03/19/23 0627  Mon Mar 19, 2023  0129 Creatinine(!): 1.39 Similar to prior  [SG]  0513 Non focal exam, still somnolent, workup so far stable. Will get MRI. Plan admit for encephalopathy  [SG]  0626 Bacteria, UA: NONE SEEN Uti unlikely [SG]  0626 B Natriuretic Peptide(!): 175.0 Does not appear to be volume overloaded  [SG]    Clinical Course User Index [SG] Sloan Leiter, DO    Brief summary: 82 year old female history as above here with AMS.  Concern for UTI.  Patient feeling unwell over the past 24 hours, she had a fall this evening with head injury, unsure of LOC.  She is anticoagulated.  Level 2 trauma was activated.  Workup so far is stable, no clear source of AMS.  Remains altered with a nonfocal neuro-exam, does seem slightly improved per family at bedside. She is still not back to baseline, lethargic/falling asleep during interview, confused regarding circumstances of her visit to the ED today. No clear source of infection or source for AMS.  MRI was ordered and is pending.  Recommend admit for encephalopathy.  Patient and family are agreeable. Admitted TRH                Additional history obtained: -Additional history obtained from family -External records from outside source obtained and reviewed including: Chart review including previous notes, labs, imaging, consultation notes including  Primary care documentation Prior admission Prior labs/home meds    Lab Tests: -I ordered, reviewed, and interpreted labs.   The pertinent results include:   Labs Reviewed  CBC WITH DIFFERENTIAL/PLATELET - Abnormal; Notable for the following components:      Result Value   Neutro Abs 7.9 (*)    All other components within normal limits  COMPREHENSIVE METABOLIC PANEL -  Abnormal; Notable for the following components:   Chloride 113 (*)    CO2 21 (*)    Glucose, Bld 118 (*)    Creatinine, Ser 1.39 (*)    Total Protein 6.3 (*)    Albumin 3.3 (*)    GFR, Estimated 38  (*)    All other components within normal limits  URINALYSIS, ROUTINE W REFLEX MICROSCOPIC - Abnormal; Notable for the following components:   Color, Urine STRAW (*)    Hgb urine dipstick MODERATE (*)    All other components within normal limits  ACETAMINOPHEN LEVEL - Abnormal; Notable for the following components:   Acetaminophen (Tylenol), Serum <10 (*)    All other components within normal limits  SALICYLATE LEVEL - Abnormal; Notable for the following components:   Salicylate Lvl <7.0 (*)    All other components within normal limits  T4, FREE - Abnormal; Notable for the following components:   Free T4 1.22 (*)    All other components within normal limits  BRAIN NATRIURETIC PEPTIDE - Abnormal; Notable for the following components:   B Natriuretic Peptide 175.0 (*)    All other components within normal limits  I-STAT VENOUS BLOOD GAS, ED - Abnormal; Notable for the following components:   pCO2, Ven 38.4 (*)    All other components within normal limits  I-STAT CHEM 8, ED - Abnormal; Notable for the following components:   Chloride 113 (*)    Creatinine, Ser 1.40 (*)    Glucose, Bld 116 (*)    All other components within normal limits  RESP PANEL BY RT-PCR (RSV, FLU A&B, COVID)  RVPGX2  LIPASE, BLOOD  AMMONIA  CK  TSH  RAPID URINE DRUG SCREEN, HOSP PERFORMED  CBC WITH DIFFERENTIAL/PLATELET  COMPREHENSIVE METABOLIC PANEL  MAGNESIUM  I-STAT CG4 LACTIC ACID, ED  I-STAT CG4 LACTIC ACID, ED    Notable for as above stable  EKG   EKG Interpretation Date/Time:  Monday March 19 2023 05:18:49 EST Ventricular Rate:  51 PR Interval:  154 QRS Duration:  100 QT Interval:  501 QTC Calculation: 462 R Axis:   -56  Text Interpretation: Sinus rhythm Left anterior fascicular block Low voltage, precordial leads sinus brady Confirmed by Tanda Rockers ([id number]) on 03/19/2023 5:31:29 AM         Imaging Studies ordered: I ordered imaging studies including CTH, CTC/S, CXR, MRI brain (MRI  PENDING) I independently visualized the following imaging with scope of interpretation limited to determining acute life threatening conditions related to emergency care; findings noted above I independently visualized and interpreted imaging. I agree with the radiologist interpretation   Medicines ordered and prescription drug management: Meds ordered this encounter  Medications   iohexol (OMNIPAQUE) 350 MG/ML injection 75 mL   sodium chloride 0.9 % bolus 1,000 mL   ondansetron (ZOFRAN) injection 4 mg   OR Linked Order Group    acetaminophen (TYLENOL) tablet 650 mg    acetaminophen (TYLENOL) suppository 650 mg   melatonin tablet 3 mg   ondansetron (ZOFRAN) injection 4 mg    -I have reviewed the patients home medicines and have made adjustments as needed   Consultations Obtained: na   Cardiac Monitoring: The patient was maintained on a cardiac monitor.  I personally viewed and interpreted the cardiac monitored which showed an underlying rhythm of: sinus brady Continuous pulse oximetry interpreted by myself, 98% on RA.    Social Determinants of Health:  Diagnosis or treatment significantly limited by social determinants of health: lives at  home w/ spouse   Reevaluation: After the interventions noted above, I reevaluated the patient and found that they have stayed the same  Co morbidities that complicate the patient evaluation  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    Final Clinical Impression(s) / ED Diagnoses Final diagnoses:  Encephalopathy, unspecified type  Anticoagulated  Injury of head, initial encounter        Sloan Leiter, DO 03/19/23 0626    Sloan Leiter, DO 03/19/23 [phone]

## 2023-03-19 ENCOUNTER — Emergency Department (HOSPITAL_COMMUNITY): Payer: Medicare HMO

## 2023-03-19 ENCOUNTER — Encounter (HOSPITAL_COMMUNITY): Payer: Self-pay | Admitting: Internal Medicine

## 2023-03-19 ENCOUNTER — Ambulatory Visit (HOSPITAL_COMMUNITY): Payer: Medicare HMO

## 2023-03-19 ENCOUNTER — Inpatient Hospital Stay (HOSPITAL_COMMUNITY): Payer: Medicare HMO

## 2023-03-19 DIAGNOSIS — G9341 Metabolic encephalopathy: Secondary | ICD-10-CM | POA: Diagnosis not present

## 2023-03-19 DIAGNOSIS — R350 Frequency of micturition: Secondary | ICD-10-CM | POA: Diagnosis not present

## 2023-03-19 DIAGNOSIS — E870 Hyperosmolality and hypernatremia: Secondary | ICD-10-CM | POA: Diagnosis present

## 2023-03-19 DIAGNOSIS — E785 Hyperlipidemia, unspecified: Secondary | ICD-10-CM | POA: Diagnosis not present

## 2023-03-19 DIAGNOSIS — W19XXXA Unspecified fall, initial encounter: Secondary | ICD-10-CM

## 2023-03-19 DIAGNOSIS — K429 Umbilical hernia without obstruction or gangrene: Secondary | ICD-10-CM | POA: Diagnosis not present

## 2023-03-19 DIAGNOSIS — Y92009 Unspecified place in unspecified non-institutional (private) residence as the place of occurrence of the external cause: Secondary | ICD-10-CM | POA: Diagnosis not present

## 2023-03-19 DIAGNOSIS — R8271 Bacteriuria: Secondary | ICD-10-CM | POA: Diagnosis not present

## 2023-03-19 DIAGNOSIS — F0390 Unspecified dementia without behavioral disturbance: Secondary | ICD-10-CM | POA: Diagnosis present

## 2023-03-19 DIAGNOSIS — F32A Depression, unspecified: Secondary | ICD-10-CM | POA: Diagnosis not present

## 2023-03-19 DIAGNOSIS — R112 Nausea with vomiting, unspecified: Secondary | ICD-10-CM

## 2023-03-19 DIAGNOSIS — K7689 Other specified diseases of liver: Secondary | ICD-10-CM | POA: Diagnosis not present

## 2023-03-19 DIAGNOSIS — Z981 Arthrodesis status: Secondary | ICD-10-CM | POA: Diagnosis not present

## 2023-03-19 DIAGNOSIS — K8689 Other specified diseases of pancreas: Secondary | ICD-10-CM | POA: Diagnosis not present

## 2023-03-19 DIAGNOSIS — Z7901 Long term (current) use of anticoagulants: Secondary | ICD-10-CM | POA: Diagnosis not present

## 2023-03-19 DIAGNOSIS — G319 Degenerative disease of nervous system, unspecified: Secondary | ICD-10-CM | POA: Diagnosis not present

## 2023-03-19 DIAGNOSIS — R251 Tremor, unspecified: Secondary | ICD-10-CM | POA: Diagnosis present

## 2023-03-19 DIAGNOSIS — D696 Thrombocytopenia, unspecified: Secondary | ICD-10-CM | POA: Diagnosis present

## 2023-03-19 DIAGNOSIS — I428 Other cardiomyopathies: Secondary | ICD-10-CM | POA: Diagnosis not present

## 2023-03-19 DIAGNOSIS — Z96642 Presence of left artificial hip joint: Secondary | ICD-10-CM | POA: Diagnosis not present

## 2023-03-19 DIAGNOSIS — R4182 Altered mental status, unspecified: Secondary | ICD-10-CM | POA: Diagnosis not present

## 2023-03-19 DIAGNOSIS — Z85828 Personal history of other malignant neoplasm of skin: Secondary | ICD-10-CM | POA: Diagnosis not present

## 2023-03-19 DIAGNOSIS — I4819 Other persistent atrial fibrillation: Secondary | ICD-10-CM | POA: Diagnosis not present

## 2023-03-19 DIAGNOSIS — R410 Disorientation, unspecified: Secondary | ICD-10-CM | POA: Diagnosis not present

## 2023-03-19 DIAGNOSIS — F03A3 Unspecified dementia, mild, with mood disturbance: Secondary | ICD-10-CM | POA: Diagnosis not present

## 2023-03-19 DIAGNOSIS — G9340 Encephalopathy, unspecified: Secondary | ICD-10-CM | POA: Diagnosis not present

## 2023-03-19 DIAGNOSIS — E039 Hypothyroidism, unspecified: Secondary | ICD-10-CM | POA: Diagnosis not present

## 2023-03-19 DIAGNOSIS — E878 Other disorders of electrolyte and fluid balance, not elsewhere classified: Secondary | ICD-10-CM | POA: Diagnosis present

## 2023-03-19 DIAGNOSIS — K5730 Diverticulosis of large intestine without perforation or abscess without bleeding: Secondary | ICD-10-CM | POA: Diagnosis not present

## 2023-03-19 DIAGNOSIS — R300 Dysuria: Secondary | ICD-10-CM | POA: Diagnosis not present

## 2023-03-19 DIAGNOSIS — A084 Viral intestinal infection, unspecified: Secondary | ICD-10-CM | POA: Diagnosis not present

## 2023-03-19 DIAGNOSIS — I10 Essential (primary) hypertension: Secondary | ICD-10-CM | POA: Diagnosis not present

## 2023-03-19 DIAGNOSIS — S0083XA Contusion of other part of head, initial encounter: Secondary | ICD-10-CM | POA: Diagnosis not present

## 2023-03-19 DIAGNOSIS — K219 Gastro-esophageal reflux disease without esophagitis: Secondary | ICD-10-CM | POA: Diagnosis not present

## 2023-03-19 DIAGNOSIS — Z8249 Family history of ischemic heart disease and other diseases of the circulatory system: Secondary | ICD-10-CM | POA: Diagnosis not present

## 2023-03-19 DIAGNOSIS — I480 Paroxysmal atrial fibrillation: Secondary | ICD-10-CM | POA: Diagnosis not present

## 2023-03-19 DIAGNOSIS — E038 Other specified hypothyroidism: Secondary | ICD-10-CM | POA: Diagnosis not present

## 2023-03-19 DIAGNOSIS — I700 Atherosclerosis of aorta: Secondary | ICD-10-CM | POA: Diagnosis not present

## 2023-03-19 DIAGNOSIS — Z79890 Hormone replacement therapy (postmenopausal): Secondary | ICD-10-CM | POA: Diagnosis not present

## 2023-03-19 DIAGNOSIS — E860 Dehydration: Secondary | ICD-10-CM | POA: Diagnosis not present

## 2023-03-19 DIAGNOSIS — Z96651 Presence of right artificial knee joint: Secondary | ICD-10-CM | POA: Diagnosis not present

## 2023-03-19 DIAGNOSIS — I6523 Occlusion and stenosis of bilateral carotid arteries: Secondary | ICD-10-CM | POA: Diagnosis not present

## 2023-03-19 HISTORY — DX: Unspecified dementia, unspecified severity, without behavioral disturbance, psychotic disturbance, mood disturbance, and anxiety: F03.90

## 2023-03-19 HISTORY — DX: Nausea with vomiting, unspecified: R11.2

## 2023-03-19 HISTORY — DX: Hyperosmolality and hypernatremia: E87.0

## 2023-03-19 HISTORY — DX: Other disorders of electrolyte and fluid balance, not elsewhere classified: E87.8

## 2023-03-19 HISTORY — DX: Tremor, unspecified: R25.1

## 2023-03-19 HISTORY — DX: Unspecified fall, initial encounter: W19.XXXA

## 2023-03-19 HISTORY — DX: Depression, unspecified: F32.A

## 2023-03-19 HISTORY — DX: Paroxysmal atrial fibrillation: I48.0

## 2023-03-19 HISTORY — DX: Thrombocytopenia, unspecified: D69.6

## 2023-03-19 LAB — RAPID URINE DRUG SCREEN, HOSP PERFORMED
Amphetamines: NOT DETECTED
Barbiturates: NOT DETECTED
Benzodiazepines: NOT DETECTED
Cocaine: NOT DETECTED
Opiates: NOT DETECTED
Tetrahydrocannabinol: NOT DETECTED

## 2023-03-19 LAB — RESP PANEL BY RT-PCR (RSV, FLU A&B, COVID)  RVPGX2
Influenza A by PCR: NEGATIVE
Influenza B by PCR: NEGATIVE
Resp Syncytial Virus by PCR: NEGATIVE
SARS Coronavirus 2 by RT PCR: NEGATIVE

## 2023-03-19 LAB — I-STAT CHEM 8, ED
BUN: 15 mg/dL (ref 8–23)
Calcium, Ion: 1.2 mmol/L (ref 1.15–1.40)
Chloride: 113 mmol/L — ABNORMAL HIGH (ref 98–111)
Creatinine, Ser: 1.4 mg/dL — ABNORMAL HIGH (ref 0.44–1.00)
Glucose, Bld: 116 mg/dL — ABNORMAL HIGH (ref 70–99)
HCT: 41 % (ref 36.0–46.0)
Hemoglobin: 13.9 g/dL (ref 12.0–15.0)
Potassium: 3.6 mmol/L (ref 3.5–5.1)
Sodium: 145 mmol/L (ref 135–145)
TCO2: 22 mmol/L (ref 22–32)

## 2023-03-19 LAB — CBC WITH DIFFERENTIAL/PLATELET
Abs Immature Granulocytes: 0.04 10*3/uL (ref 0.00–0.07)
Abs Immature Granulocytes: 0.05 10*3/uL (ref 0.00–0.07)
Basophils Absolute: 0 10*3/uL (ref 0.0–0.1)
Basophils Absolute: 0 10*3/uL (ref 0.0–0.1)
Basophils Relative: 0 %
Basophils Relative: 0 %
Eosinophils Absolute: 0.1 10*3/uL (ref 0.0–0.5)
Eosinophils Absolute: 0.1 10*3/uL (ref 0.0–0.5)
Eosinophils Relative: 1 %
Eosinophils Relative: 1 %
HCT: 39.6 % (ref 36.0–46.0)
HCT: 45.6 % (ref 36.0–46.0)
Hemoglobin: 12.6 g/dL (ref 12.0–15.0)
Hemoglobin: 14.2 g/dL (ref 12.0–15.0)
Immature Granulocytes: 0 %
Immature Granulocytes: 1 %
Lymphocytes Relative: 13 %
Lymphocytes Relative: 13 %
Lymphs Abs: 1.2 10*3/uL (ref 0.7–4.0)
Lymphs Abs: 1.3 10*3/uL (ref 0.7–4.0)
MCH: 29.3 pg (ref 26.0–34.0)
MCH: 29.5 pg (ref 26.0–34.0)
MCHC: 31.1 g/dL (ref 30.0–36.0)
MCHC: 31.8 g/dL (ref 30.0–36.0)
MCV: 92.7 fL (ref 80.0–100.0)
MCV: 94.2 fL (ref 80.0–100.0)
Monocytes Absolute: 0.6 10*3/uL (ref 0.1–1.0)
Monocytes Absolute: 0.8 10*3/uL (ref 0.1–1.0)
Monocytes Relative: 6 %
Monocytes Relative: 8 %
Neutro Abs: 7 10*3/uL (ref 1.7–7.7)
Neutro Abs: 7.9 10*3/uL — ABNORMAL HIGH (ref 1.7–7.7)
Neutrophils Relative %: 78 %
Neutrophils Relative %: 79 %
Platelets: 147 10*3/uL — ABNORMAL LOW (ref 150–400)
Platelets: 161 10*3/uL (ref 150–400)
RBC: 4.27 MIL/uL (ref 3.87–5.11)
RBC: 4.84 MIL/uL (ref 3.87–5.11)
RDW: 15.4 % (ref 11.5–15.5)
RDW: 15.5 % (ref 11.5–15.5)
WBC: 9.2 10*3/uL (ref 4.0–10.5)
WBC: 9.9 10*3/uL (ref 4.0–10.5)
nRBC: 0 % (ref 0.0–0.2)
nRBC: 0 % (ref 0.0–0.2)

## 2023-03-19 LAB — LIPASE, BLOOD
Lipase: 20 U/L (ref 11–51)
Lipase: 20 U/L (ref 11–51)

## 2023-03-19 LAB — COMPREHENSIVE METABOLIC PANEL
ALT: 18 U/L (ref 0–44)
ALT: 20 U/L (ref 0–44)
AST: 17 U/L (ref 15–41)
AST: 22 U/L (ref 15–41)
Albumin: 2.9 g/dL — ABNORMAL LOW (ref 3.5–5.0)
Albumin: 3.3 g/dL — ABNORMAL LOW (ref 3.5–5.0)
Alkaline Phosphatase: 51 U/L (ref 38–126)
Alkaline Phosphatase: 60 U/L (ref 38–126)
Anion gap: 10 (ref 5–15)
Anion gap: 6 (ref 5–15)
BUN: 12 mg/dL (ref 8–23)
BUN: 14 mg/dL (ref 8–23)
CO2: 21 mmol/L — ABNORMAL LOW (ref 22–32)
CO2: 25 mmol/L (ref 22–32)
Calcium: 9.1 mg/dL (ref 8.9–10.3)
Calcium: 9.6 mg/dL (ref 8.9–10.3)
Chloride: 113 mmol/L — ABNORMAL HIGH (ref 98–111)
Chloride: 115 mmol/L — ABNORMAL HIGH (ref 98–111)
Creatinine, Ser: 1.34 mg/dL — ABNORMAL HIGH (ref 0.44–1.00)
Creatinine, Ser: 1.39 mg/dL — ABNORMAL HIGH (ref 0.44–1.00)
GFR, Estimated: 38 mL/min — ABNORMAL LOW (ref >60)
GFR, Estimated: 40 mL/min — ABNORMAL LOW (ref >60)
Glucose, Bld: 104 mg/dL — ABNORMAL HIGH (ref 70–99)
Glucose, Bld: 118 mg/dL — ABNORMAL HIGH (ref 70–99)
Potassium: 3.7 mmol/L (ref 3.5–5.1)
Potassium: 3.8 mmol/L (ref 3.5–5.1)
Sodium: 144 mmol/L (ref 135–145)
Sodium: 146 mmol/L — ABNORMAL HIGH (ref 135–145)
Total Bilirubin: 0.3 mg/dL (ref <1.2)
Total Bilirubin: 0.5 mg/dL (ref <1.2)
Total Protein: 5.6 g/dL — ABNORMAL LOW (ref 6.5–8.1)
Total Protein: 6.3 g/dL — ABNORMAL LOW (ref 6.5–8.1)

## 2023-03-19 LAB — URINALYSIS, ROUTINE W REFLEX MICROSCOPIC
Bacteria, UA: NONE SEEN
Bilirubin Urine: NEGATIVE
Glucose, UA: NEGATIVE mg/dL
Ketones, ur: NEGATIVE mg/dL
Leukocytes,Ua: NEGATIVE
Nitrite: NEGATIVE
Protein, ur: NEGATIVE mg/dL
Specific Gravity, Urine: 1.012 (ref 1.005–1.030)
pH: 8 (ref 5.0–8.0)

## 2023-03-19 LAB — BRAIN NATRIURETIC PEPTIDE: B Natriuretic Peptide: 175 pg/mL — ABNORMAL HIGH (ref 0.0–100.0)

## 2023-03-19 LAB — I-STAT VENOUS BLOOD GAS, ED
Acid-Base Excess: 0 mmol/L (ref 0.0–2.0)
Bicarbonate: 24.1 mmol/L (ref 20.0–28.0)
Calcium, Ion: 1.21 mmol/L (ref 1.15–1.40)
HCT: 41 % (ref 36.0–46.0)
Hemoglobin: 13.9 g/dL (ref 12.0–15.0)
O2 Saturation: 80 %
Potassium: 3.7 mmol/L (ref 3.5–5.1)
Sodium: 144 mmol/L (ref 135–145)
TCO2: 25 mmol/L (ref 22–32)
pCO2, Ven: 38.4 mm[Hg] — ABNORMAL LOW (ref 44–60)
pH, Ven: 7.406 (ref 7.25–7.43)
pO2, Ven: 44 mm[Hg] (ref 32–45)

## 2023-03-19 LAB — I-STAT CG4 LACTIC ACID, ED
Lactic Acid, Venous: 1.1 mmol/L (ref 0.5–1.9)
Lactic Acid, Venous: 1.3 mmol/L (ref 0.5–1.9)

## 2023-03-19 LAB — T4, FREE: Free T4: 1.22 ng/dL — ABNORMAL HIGH (ref 0.61–1.12)

## 2023-03-19 LAB — TSH: TSH: 3.702 u[IU]/mL (ref 0.350–4.500)

## 2023-03-19 LAB — ACETAMINOPHEN LEVEL: Acetaminophen (Tylenol), Serum: <10 ug/mL — ABNORMAL LOW (ref 10–30)

## 2023-03-19 LAB — SALICYLATE LEVEL: Salicylate Lvl: <7 mg/dL — ABNORMAL LOW (ref 7.0–30.0)

## 2023-03-19 LAB — C-REACTIVE PROTEIN: CRP: <0.5 mg/dL (ref <1.0)

## 2023-03-19 LAB — CK: Total CK: 86 U/L (ref 38–234)

## 2023-03-19 LAB — MAGNESIUM: Magnesium: 2.1 mg/dL (ref 1.7–2.4)

## 2023-03-19 LAB — AMMONIA: Ammonia: 18 umol/L (ref 9–35)

## 2023-03-19 MED ORDER — PRIMIDONE 50 MG PO TABS
50.0000 mg | ORAL_TABLET | Freq: Every day | ORAL | Status: DC
  Filled 2023-03-19: qty 1

## 2023-03-19 MED ORDER — POLYVINYL ALCOHOL 1.4 % OP SOLN: 1.0000 [drp] | OPHTHALMIC | Status: DC | PRN

## 2023-03-19 MED ORDER — ACETAMINOPHEN 325 MG PO TABS: 650.0000 mg | ORAL_TABLET | Freq: Four times a day (QID) | ORAL | Status: DC | PRN

## 2023-03-19 MED ORDER — MENTHOL (TOPICAL ANALGESIC) 4 % EX GEL: 1.0000 | CUTANEOUS | Status: DC | PRN

## 2023-03-19 MED ORDER — POTASSIUM CHLORIDE CRYS ER 20 MEQ PO TBCR
20.0000 meq | EXTENDED_RELEASE_TABLET | Freq: Every day | ORAL | Status: DC
  Administered 2023-03-19: 20 meq via ORAL
  Filled 2023-03-19: qty 1

## 2023-03-19 MED ORDER — DEXTROSE 5 % IV SOLN: INTRAVENOUS | Status: DC

## 2023-03-19 MED ORDER — IOHEXOL 350 MG/ML SOLN
75.0000 mL | Freq: Once | INTRAVENOUS | Status: AC | PRN
  Administered 2023-03-19: 75 mL via INTRAVENOUS

## 2023-03-19 MED ORDER — TRAMADOL HCL 50 MG PO TABS
50.0000 mg | ORAL_TABLET | ORAL | Status: DC | PRN
  Administered 2023-03-19 – 2023-03-20 (×2): 50 mg via ORAL
  Filled 2023-03-19 (×2): qty 1

## 2023-03-19 MED ORDER — AMIODARONE HCL 200 MG PO TABS
200.0000 mg | ORAL_TABLET | Freq: Every day | ORAL | Status: DC
  Administered 2023-03-19 – 2023-03-20 (×2): 200 mg via ORAL
  Filled 2023-03-19 (×2): qty 1

## 2023-03-19 MED ORDER — ACETAMINOPHEN 650 MG RE SUPP: 650.0000 mg | Freq: Four times a day (QID) | RECTAL | Status: DC | PRN

## 2023-03-19 MED ORDER — LEVOTHYROXINE SODIUM 75 MCG PO TABS
75.0000 ug | ORAL_TABLET | Freq: Every day | ORAL | Status: DC
  Administered 2023-03-20: 75 ug via ORAL
  Filled 2023-03-19: qty 1

## 2023-03-19 MED ORDER — APIXABAN 5 MG PO TABS
5.0000 mg | ORAL_TABLET | Freq: Two times a day (BID) | ORAL | Status: DC
Start: 2023-03-19 — End: 2023-03-20
  Administered 2023-03-19 – 2023-03-20 (×3): 5 mg via ORAL
  Filled 2023-03-19 (×3): qty 1

## 2023-03-19 MED ORDER — SODIUM CHLORIDE 0.9 % IV SOLN: INTRAVENOUS | Status: DC

## 2023-03-19 MED ORDER — SODIUM CHLORIDE 0.9 % IV SOLN
1.0000 g | INTRAVENOUS | Status: DC
  Administered 2023-03-19: 1 g via INTRAVENOUS
  Filled 2023-03-19: qty 10

## 2023-03-19 MED ORDER — MELATONIN 3 MG PO TABS: 3.0000 mg | ORAL_TABLET | Freq: Every evening | ORAL | Status: DC | PRN

## 2023-03-19 MED ORDER — SODIUM CHLORIDE 0.9 % IV BOLUS
1000.0000 mL | Freq: Once | INTRAVENOUS | Status: AC
  Administered 2023-03-19: 1000 mL via INTRAVENOUS

## 2023-03-19 MED ORDER — ROSUVASTATIN CALCIUM 20 MG PO TABS
20.0000 mg | ORAL_TABLET | Freq: Every day | ORAL | Status: DC
  Administered 2023-03-19: 20 mg via ORAL
  Filled 2023-03-19: qty 1

## 2023-03-19 MED ORDER — DEXTRAN 70-HYPROMELLOSE (PF) 0.1-0.3 % OP SOLN: 1.0000 [drp] | OPHTHALMIC | Status: DC | PRN

## 2023-03-19 MED ORDER — SERTRALINE HCL 50 MG PO TABS
25.0000 mg | ORAL_TABLET | Freq: Every day | ORAL | Status: DC
Start: 2023-03-19 — End: 2023-03-20
  Administered 2023-03-19: 25 mg via ORAL
  Filled 2023-03-19: qty 1

## 2023-03-19 MED ORDER — ONDANSETRON HCL 4 MG/2ML IJ SOLN: 4.0000 mg | Freq: Four times a day (QID) | INTRAMUSCULAR | Status: DC | PRN

## 2023-03-19 MED ORDER — DILTIAZEM HCL ER COATED BEADS 120 MG PO CP24
240.0000 mg | ORAL_CAPSULE | Freq: Every day | ORAL | Status: DC
  Administered 2023-03-19 – 2023-03-20 (×2): 240 mg via ORAL
  Filled 2023-03-19: qty 1
  Filled 2023-03-19: qty 2

## 2023-03-19 MED ORDER — BACLOFEN 10 MG PO TABS
5.0000 mg | ORAL_TABLET | ORAL | Status: DC | PRN
Start: 2023-03-19 — End: 2023-03-20

## 2023-03-19 MED ORDER — ONDANSETRON HCL 4 MG/2ML IJ SOLN
4.0000 mg | Freq: Once | INTRAMUSCULAR | Status: AC
Start: 2023-03-19 — End: 2023-03-19
  Administered 2023-03-19: 4 mg via INTRAVENOUS
  Filled 2023-03-19: qty 2

## 2023-03-19 NOTE — H&P (Signed)
History and Physical    Patient: Bridget [name]:[id number] DOB: [birth year] DOA: 03/18/2023 DOS: the patient was seen and examined on 03/19/2023 PCP: Marylen Ponto, MD  Patient coming from: Home  Chief Complaint:  Chief Complaint  Patient presents with   Urinary Frequency   HPI: Bridget [name] is a 82 y.o. female with medical history significant of hypertension, hyperlipidemia, atrial fibrillation on chronic anticoagulation, nonischemic cardiomyopathy, hypothyroidism, and dementia presents with a two-day history of nausea and vomiting.  Patient was noted to be in her normal state of health when going to bed on 12/28.  She was noted to multiple episodes of nonbloody emesis.  The patient's daughter had given her Phenergan for vomiting which have provided some mild relief.  Family became concerned when she remained lethargic and slept most of the day.    The patient's mental status was also noted to be altered, with family having difficulty rousing her and inconsistent responses to questions.  Normally at baseline patient is alert and oriented to person and place and able to recognize family. Her symptoms reminded family of when she was just recently hospitalized 11/2022 with sepsis secondary to urinary tract infection and had been acute altered.  The patient also had a fall while being assisted to the bathroom by he husband yesterday evening. There were no reported injuries related to the fall.   The patient's last episode of vomiting was this morning.  The patient denied any shortness of breath, cough, abdominal pain, dysuria, or NSAID use.  In the emergency department patient was noted to be afebrile with pulse 51-68, blood pressures 108/53 to 166/71, and O2 saturations maintained.  Labs significant for WBC 9.2, platelets 147, sodium 146, chloride 115, BUN 12, creatinine 1.34, albumin 2.9, and lactic acid 1.3.  CT scan of the head and cervical spine did not note any acute abnormality.  CT  scan of the abdomen and pelvis have been obtained and noted intra and extrahepatic biliary duct dilatation likely due to post cholecystectomy state and sigmoid diverticulosis without diverticulitis.  Chest x-ray noted no acute abnormality.  Influenza, COVID-19, and RSV screening were negative.  Urinalysis noted moderate hemoglobin but no significant signs for infection.  UDS was negative.  Patient was given 1 L normal saline IV fluids and Zofran.  Review of Systems: As mentioned in the history of present illness. All other systems reviewed and are negative. 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   Past Surgical History:  Procedure Laterality Date   ATRIAL FIBRILLATION ABLATION N/A 12/16/2019   Procedure: ATRIAL FIBRILLATION ABLATION;  Surgeon: Hillis Range, MD;  Location: [facility] INVASIVE CV LAB;  Service: Cardiovascular;  Laterality: N/A;   CARDIOVERSION N/A 05/02/2019   Procedure: CARDIOVERSION;  Surgeon: Chrystie Nose, MD;  Location: [facility] ENDOSCOPY;  Service: Cardiovascular;  Laterality: N/A;   CARDIOVERSION N/A 05/14/2019   Procedure: CARDIOVERSION;  Surgeon: Parke Poisson, MD;  Location: [facility] ENDOSCOPY;  Service: Cardiovascular;  Laterality: N/A;   CATARACT EXTRACTION, BILATERAL     CERVICAL DISCECTOMY  2005   with fusion   CHOLECYSTECTOMY  2006   COLONOSCOPY     LUMBAR LAMINECTOMY/DECOMPRESSION MICRODISCECTOMY Right 11/13/2014   Procedure: Right Lumbar four-five Microdiskectomy;  Surgeon: Hilda Lias, MD;  Location: [facility] NEURO ORS;  Service: Neurosurgery;  Laterality: Right;  right   POLYPECTOMY     REPLACEMENT TOTAL KNEE Right 05/2012   TEE WITHOUT CARDIOVERSION N/A 05/02/2019   Procedure: TRANSESOPHAGEAL ECHOCARDIOGRAM (TEE);  Surgeon: Chrystie Nose, MD;  Location: [facility] ENDOSCOPY;  Service: Cardiovascular;  Laterality: N/A;   TOTAL ABDOMINAL HYSTERECTOMY  1978   TOTAL HIP ARTHROPLASTY Left 04/2011   TRANSTHORACIC ECHOCARDIOGRAM  12/2008   EF=>55%, borderline conc LVH; trace MR; mod TR; trace AV regurg   Social History:  reports that she has never smoked. She has never used smokeless tobacco. She reports that she does not drink alcohol and does not use drugs.  Allergies  Allergen Reactions   Codeine Anaphylaxis    Other Reaction(s): GI Intolerance   Sulfonamide Derivatives Anaphylaxis   Triptans Other (See Comments)    Stopped blood supply to mesenteric artery per pt  Other Reaction(s): Other (See Comments)   Amlodipine Other (See Comments)    swelling   Chocolate Other (See Comments)    Headache   Onion Other (See Comments)    Headache   Other     MSG- headaches    Family History  Problem Relation Age of Onset   Stroke Mother 31   Coronary artery disease Father 53   Colon polyps Father    Colon cancer Cousin    Esophageal cancer Cousin    Stroke Maternal Grandfather    Stroke Paternal Grandmother    Breast cancer Paternal Grandmother    Suicidality Daughter    Rectal cancer Neg Hx    Stomach cancer Neg Hx    Tremor Neg Hx    Parkinson's disease Neg Hx    Alzheimer's disease Neg Hx    Dementia Neg Hx     Prior to Admission medications   Medication Sig Start Date End Date Taking? Authorizing Provider  amiodarone (PACERONE) 200 MG tablet Take 1 tablet (200 mg total) by mouth daily. 01/20/23  Yes Yvonna Alanis L, PA-C  baclofen (LIORESAL) 10 MG tablet Take 0.5 tablets by mouth as needed (as needed for migraine pain and neuropathy.).  08/23/19  Yes [provider]  Dextran 70-Hypromellose (ARTIFICIAL TEARS PF OP) Place 1 drop into both eyes daily as needed (For dry eyes).   Yes [provider]  diltiazem (CARDIZEM CD) 240 MG 24 hr capsule TAKE 1 CAPSULE BY MOUTH EVERY DAY 10/25/22  Yes Hilty, Lisette Abu, MD  levothyroxine (SYNTHROID, LEVOTHROID) 75 MCG tablet Take 75 mcg by mouth daily before breakfast.   Yes [provider]  Menthol, Topical Analgesic, (BIOFREEZE COOL THE PAIN) 4 % GEL Apply 1 Application topically as needed (neck pain).   Yes [provider]  potassium chloride SA (KLOR-CON M20) 20 MEQ tablet TAKE 1 TABLET (20 MEQ TOTAL) BY MOUTH AS NEEDED (TAKE ON DAYS LASIX IS TAKEN). Patient taking differently: Take 20 mEq by mouth at bedtime. 08/18/21  Yes Hilty, Lisette Abu, MD  primidone (MYSOLINE) 50 MG tablet Take 50 mg by mouth at bedtime. 03/02/23  Yes [provider]  rosuvastatin (CRESTOR) 20 MG tablet TAKE 1 TABLET BY MOUTH EVERY DAY Patient taking differently:  Take 20 mg by mouth at bedtime. 02/02/23  Yes Hilty, Lisette Abu, MD  sertraline (ZOLOFT) 25 MG tablet Take 25 mg by mouth at bedtime.   Yes [provider]  traMADol (ULTRAM) 50 MG tablet Take 50-100 mg by mouth every 4 (four) hours as needed for moderate pain (as needed for migraine pain and neuropathy.).  03/09/16  Yes [provider]  ELIQUIS 5 MG TABS tablet TAKE 1 TABLET BY MOUTH TWICE A DAY 11/08/22   Chrystie Nose, MD    Physical Exam: Vitals:   03/19/23 0340 03/19/23 0500 03/19/23 0530 03/19/23 0656  BP: (!) 156/84 135/64 (!) 108/53   Pulse: (!) 55 60 (!) 51   Resp: 15 (!) 21 16   Temp:    98.7 F (37.1 C)  TempSrc:    Oral  SpO2: 96% 97% 97%   Weight:      Height:        Constitutional: Elderly female who appears to be ill but in no acute distress Eyes: PERRL, lids and conjunctivae normal ENMT: Mucous membranes are moist.  Normal dentition.  Neck: normal, supple  Respiratory: clear to auscultation  bilaterally, no wheezing, no crackles. Normal respiratory effort. No accessory muscle use.  Cardiovascular: Regular rate and rhythm, no murmurs / rubs / gallops. No extremity edema.  Abdomen: no tenderness, no masses palpated. No hepatosplenomegaly. Bowel sounds positive.  Musculoskeletal: no clubbing / cyanosis. No joint deformity upper and lower extremities. Good ROM, no contractures. Normal muscle tone.  Skin: no rashes, lesions, ulcers.  Pallor present. Neurologic: CN 2-12 grossly intact. Sensation intact, DTR normal. Strength 5/5 in all 4.  Psychiatric: Normal judgment and insight. Alert and oriented x person and place at this time.  Normal mood.  Data Reviewed:   EKG reveals sinus rhythm at 72 bpm with ventricular bigeminy.  Reviewed labs, imaging, and pertinent records as documented. Assessment and Plan:  Acute metabolic encephalopathy Patient presents after being noted to be acutely altered and less responsive than normal by family..  CT and subsequent MRI of the brain were negative for any acute abnormality.  Thyroid studies were within normal limits.  Chest x-ray was noted to be clear.  Influenza, COVID-19, and RSV screening were negative.  Urinalysis did not show any significant signs for infection, but given prior hospitalization family concern as urine appears to be significantly cloudy.  UDS negative.  Suspect symptoms could be secondary to patient being dehydrated, viral illness, and/or related to recently started medications of primidone. -Admit to a medical telemetry bed -Check urine culture -Add on CRP -Empirically started patient on Rocephin IV -Normal saline IV fluids at 75 mL/h  Nausea and vomiting Daughter notes that patient was experiencing nausea and vomiting over the last 2 days.  No significant abdominal pain noted on physical exam.  No clear cause for symptoms. -Add on lipase -Antiemetics as needed -Continue symptomatic treatment as noted  above  Hypernatremia Hyperchloremia Acute.  On admission sodium 146 and chloride 115.  Had been given 1 L normal saline IV fluids. -D5 IV fluids at 75 mL/h for 1 L -Recheck sodium levels in a.m.  Paroxysmal atrial fibrillation on chronic anticoagulation Patient appears to be in sinus rhythm at this time.  Patient with history of prior cardioversions and last ablation in 2021. -Continue amiodarone, Cardizem, and Eliquis  Thrombocytopenia Chronic.  Platelet count 147 which appears similar to previous checks a couple months ago. -Continue to monitor  Tremor Patient had recently been prescribed primidone  by her primary in regards to her tremor.  Review of drug-drug interactions note decreased absorption of Eliquis.  Other side effects also appear to include nausea, vomiting, and lethargy -Hold primidone until patient able to follow-up with primary  Fall at home Patient had been noted to have a fall at home prior to arrival.  No injuries were reported.  Patient normally ambulates with the use of a cane intermittently. -PT to eval and treat in a.m.  Hypothyroidism TSH 3.702. -Continue levothyroxine  Depression -Continue Zoloft  Dementia Patient had previously been on Namenda, but does not appear to be currently taking the medication.  DVT prophylaxis: Eliquis Advance Care Planning:   Code Status: Full Code   Consults: None  Family Communication: Daughter updated at bedside  Severity of Illness: The appropriate patient status for this patient is INPATIENT. Inpatient status is judged to be reasonable and necessary in order to provide the required intensity of service to ensure the patient's safety. The patient's presenting symptoms, physical exam findings, and initial radiographic and laboratory data in the context of their chronic comorbidities is felt to place them at high risk for further clinical deterioration. Furthermore, it is not anticipated that the patient will be  medically stable for discharge from the hospital within 2 midnights of admission.   * I certify that at the point of admission it is my clinical judgment that the patient will require inpatient hospital care spanning beyond 2 midnights from the point of admission due to high intensity of service, high risk for further deterioration and high frequency of surveillance required.*  Author: Clydie Braun, MD 03/19/2023 8:10 AM  For on call review [email or web address].

## 2023-03-19 NOTE — ED Notes (Signed)
ED TO INPATIENT HANDOFF REPORT  ED Nurse Name and Phone #: Arline Asp, RN [phone]  S Name/Age/Gender Bridget [name] 82 y.o. female Room/Bed: 011C/011C  Code Status   Code Status: Full Code  Home/SNF/Other Home Patient oriented to: self and place Is this baseline? No   Triage Complete: Triage complete  Chief Complaint Acute encephalopathy [G93.40]  Triage Note Pt BIB EMS from home family concerned for possible UTI for increase in urinary frequency. States she was "acting different" this morning. Pt visibly altered in triage, falling asleep during conversation. EMS unsure of baseline.    Allergies Allergies  Allergen Reactions   Codeine Anaphylaxis    Other Reaction(s): GI Intolerance   Sulfonamide Derivatives Anaphylaxis   Triptans Other (See Comments)    Stopped blood supply to mesenteric artery per pt  Other Reaction(s): Other (See Comments)   Amlodipine Other (See Comments)    swelling   Chocolate Other (See Comments)    Headache   Onion Other (See Comments)    Headache   Other     MSG- headaches    Level of Care/Admitting Diagnosis ED Disposition     ED Disposition  Admit   Condition  --   Comment  Hospital Area: [facility] [[id number]]  Level of Care: Telemetry Medical [104]  May admit patient to [facility] or [facility] if equivalent level of care is available:: No  Covid Evaluation: Asymptomatic - no recent exposure (last 10 days) testing not required  Diagnosis: Acute encephalopathy [454098]  Admitting Physician: Angie Fava [[id number]]  Attending Physician: Angie Fava [[id number]]  Certification:: I certify this patient will need inpatient services for at least 2 midnights  Expected Medical Readiness: 03/21/2023          B Medical/Surgery History Past Medical History:  Diagnosis Date   Allergy    Arthritis    hip OA   Degenerative disc disease    GERD (gastroesophageal reflux disease)    Hyperlipidemia     Hypertension    Hypothyroidism    Migraines    Neuromuscular disorder (HCC)    Neuropathy    Nonischemic cardiomyopathy (HCC)    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   Past Surgical History:  Procedure Laterality Date   ATRIAL FIBRILLATION ABLATION N/A 12/16/2019   Procedure: ATRIAL FIBRILLATION ABLATION;  Surgeon: Hillis Range, MD;  [facility] INVASIVE CV LAB;  Service: Cardiovascular;  Laterality: N/A;   CARDIOVERSION N/A 05/02/2019   Procedure: CARDIOVERSION;  Surgeon: Chrystie Nose, MD;  Location: [facility] ENDOSCOPY;  Service: Cardiovascular;  Laterality: N/A;   CARDIOVERSION N/A 05/14/2019   Procedure: CARDIOVERSION;  Surgeon: Parke Poisson, MD;  Location: [facility] ENDOSCOPY;  Service: Cardiovascular;  Laterality: N/A;   CATARACT EXTRACTION, BILATERAL     CERVICAL DISCECTOMY  2005   with fusion   CHOLECYSTECTOMY  2006   COLONOSCOPY     LUMBAR LAMINECTOMY/DECOMPRESSION MICRODISCECTOMY Right 11/13/2014   Procedure: Right Lumbar four-five Microdiskectomy;  Surgeon: Hilda Lias, MD;  Location: [facility] NEURO ORS;  Service: Neurosurgery;  Laterality: Right;  right   POLYPECTOMY     REPLACEMENT TOTAL KNEE Right 05/2012   TEE WITHOUT CARDIOVERSION N/A 05/02/2019   Procedure: TRANSESOPHAGEAL ECHOCARDIOGRAM (TEE);  Surgeon: Chrystie Nose, MD;  Location: [facility] ENDOSCOPY;  Service: Cardiovascular;  Laterality: N/A;   TOTAL ABDOMINAL HYSTERECTOMY  1978   TOTAL HIP ARTHROPLASTY Left 04/2011   TRANSTHORACIC ECHOCARDIOGRAM  12/2008   EF=>55%, borderline conc LVH; trace MR; mod TR; trace AV regurg     A IV Location/Drains/Wounds Patient Lines/Drains/Airways Status     Active Line/Drains/Airways     Name Placement date Placement time Site Days   Peripheral IV 03/19/23 20 G Left Antecubital  03/19/23  0000  Antecubital  less than 1            Intake/Output Last 24 hours  Intake/Output Summary (Last 24 hours) at 03/19/2023 0850 Last data filed at 03/19/2023 0301 Gross per 24 hour  Intake 1000 ml  Output --  Net 1000 ml    Labs/Imaging Results for orders placed or performed during the hospital encounter of 03/18/23 (from the past 48 hours)  CBC with Differential     Status: Abnormal   Collection Time: 03/18/23 11:38 PM  Result Value Ref Range   WBC 9.9 4.0 - 10.5 K/uL   RBC 4.84 3.87 - 5.11 MIL/uL   Hemoglobin 14.2 12.0 - 15.0 g/dL   HCT 84.1 32.4 - 40.1 %   MCV 94.2 80.0 - 100.0 fL   MCH 29.3 26.0 - 34.0 pg   MCHC 31.1 30.0 - 36.0 g/dL   RDW 02.7 25.3 - 66.4 %   Platelets 161 150 - 400 K/uL   nRBC 0.0 0.0 - 0.2 %   Neutrophils Relative % 79 %   Neutro Abs 7.9 (H) 1.7 - 7.7 K/uL   Lymphocytes Relative 13 %   Lymphs Abs 1.3 0.7 - 4.0 K/uL   Monocytes Relative 6 %   Monocytes Absolute 0.6 0.1 - 1.0 K/uL   Eosinophils Relative 1 %   Eosinophils Absolute 0.1 0.0 - 0.5 K/uL   Basophils Relative 0 %   Basophils Absolute 0.0 0.0 - 0.1 K/uL   Immature Granulocytes 1 %   Abs Immature Granulocytes 0.05 0.00 - 0.07 K/uL    Comment: Performed at [facility], [address], [place], [place] [address]  Comprehensive metabolic panel     Status: Abnormal   Collection Time: 03/18/23 11:38 PM  Result Value Ref Range   Sodium 144 135 - 145 mmol/L   Potassium 3.7 3.5 - 5.1 mmol/L   Chloride 113 (H) 98 - 111 mmol/L   CO2 21 (L) 22 - 32 mmol/L   Glucose, Bld 118 (H) 70 - 99 mg/dL    Comment: Glucose reference range applies only to samples taken after fasting for at least 8 hours.   BUN 14 8 - 23 mg/dL   Creatinine, Ser 4.25 (H) 0.44 - 1.00 mg/dL   Calcium 9.6 8.9 - 95.6 mg/dL   Total Protein 6.3 (L) 6.5 - 8.1 g/dL   Albumin 3.3 (L) 3.5 - 5.0 g/dL   AST 22 15 - 41 U/L   ALT 20 0 - 44 U/L   Alkaline Phosphatase 60 38 - 126 U/L   Total Bilirubin 0.3 <1.2 mg/dL    GFR, Estimated 38 (L) >60 mL/min    Comment: (NOTE) Calculated using the CKD-EPI Creatinine Equation (2021)    Anion gap 10 5 - 15    Comment: Performed at [facility], [address] [place], [place] [address]  Lipase, blood     Status: None   Collection Time: 03/18/23 11:38 PM  Result Value Ref Range   Lipase 20 11 - 51 U/L  Comment: Performed at [facility], [address] [place], [place] [address]  Ammonia     Status: None   Collection Time: 03/18/23 11:38 PM  Result Value Ref Range   Ammonia 18 9 - 35 umol/L    Comment: Performed at [facility] Lab, [address], [place], [place] [address]  Acetaminophen level     Status: Abnormal   Collection Time: 03/18/23 11:38 PM  Result Value Ref Range   Acetaminophen (Tylenol), Serum <10 (L) 10 - 30 ug/mL    Comment: (NOTE) Therapeutic concentrations vary significantly. A range of 10-30 ug/mL  may be an effective concentration for many patients. However, some  are best treated at concentrations outside of this range. Acetaminophen concentrations >150 ug/mL at 4 hours after ingestion  and >50 ug/mL at 12 hours after ingestion are often associated with  toxic reactions.  Performed at [facility] Lab, [address] [place], [place] [address]   Salicylate level     Status: Abnormal   Collection Time: 03/18/23 11:38 PM  Result Value Ref Range   Salicylate Lvl <7.0 (L) 7.0 - 30.0 mg/dL    Comment: Performed at [facility] Lab, [address], [place], [place] [address]  T4, free     Status: Abnormal   Collection Time: 03/18/23 11:38 PM  Result Value Ref Range   Free T4 1.22 (H) 0.61 - 1.12 ng/dL    Comment: (NOTE) Biotin ingestion may interfere with free T4 tests. If the results are inconsistent with the TSH level, previous test results, or the clinical presentation, then consider biotin interference. If needed, order repeat testing after stopping biotin. Performed at [facility] Lab, [address] [place], [place] [address]   Brain natriuretic peptide     Status: Abnormal   Collection Time: 03/18/23 11:38 PM  Result Value Ref Range   B Natriuretic Peptide 175.0 (H) 0.0 - 100.0 pg/mL    Comment: Performed at [facility] Lab, [address], [place], [place] [address]  CK     Status: None   Collection Time: 03/18/23 11:38 PM  Result Value Ref Range   Total CK 86 38 - 234 U/L    Comment: Performed at [facility] Lab, [address], [place], [place] [address]  TSH     Status: None   Collection Time: 03/18/23 11:38 PM  Result Value Ref Range   TSH 3.702 0.350 - 4.500 uIU/mL    Comment: Performed by a 3rd Generation assay with a functional sensitivity of <=0.01 uIU/mL. Performed at [facility] Lab, [address], [place], [place] [address]   I-stat chem 8, ed     Status: Abnormal   Collection Time: 03/19/23 12:06 AM  Result Value Ref Range   Sodium 145 135 - 145 mmol/L   Potassium 3.6 3.5 - 5.1 mmol/L   Chloride 113 (H) 98 - 111 mmol/L   BUN 15 8 - 23 mg/dL   Creatinine, Ser 4.40 (H) 0.44 - 1.00 mg/dL   Glucose, Bld 347 (H) 70 - 99 mg/dL    Comment: Glucose reference range applies only to samples taken after fasting for at least 8 hours.   Calcium, Ion 1.20 1.15 - 1.40 mmol/L   TCO2 22 22 - 32 mmol/L   Hemoglobin 13.9 12.0 - 15.0 g/dL   HCT 42.5 95.6 - 38.7 %  I-Stat CG4 Lactic Acid     Status: None   Collection Time: 03/19/23  12:07 AM  Result Value Ref Range   Lactic Acid, Venous 1.1 0.5 - 1.9 mmol/L  I-Stat venous blood gas, ([facility] ED, [facility], [facility])     Status: Abnormal   Collection Time: 03/19/23 12:11 AM  Result Value Ref Range   pH, Ven 7.406 7.25 - 7.43   pCO2, Ven 38.4 (L) 44 - 60 mmHg   pO2, Ven 44 32 - 45 mmHg   Bicarbonate 24.1 20.0 - 28.0 mmol/L   TCO2 25 22 - 32 mmol/L   O2 Saturation 80 %   Acid-Base Excess 0.0 0.0 - 2.0 mmol/L   Sodium 144 135 - 145 mmol/L   Potassium 3.7 3.5 - 5.1 mmol/L   Calcium, Ion 1.21 1.15 - 1.40 mmol/L   HCT 41.0  36.0 - 46.0 %   Hemoglobin 13.9 12.0 - 15.0 g/dL   Sample type VENOUS   Resp panel by RT-PCR (RSV, Flu A&B, Covid) Anterior Nasal Swab     Status: None   Collection Time: 03/19/23 12:50 AM   Specimen: Anterior Nasal Swab  Result Value Ref Range   SARS Coronavirus 2 by RT PCR NEGATIVE NEGATIVE   Influenza A by PCR NEGATIVE NEGATIVE   Influenza B by PCR NEGATIVE NEGATIVE    Comment: (NOTE) The Xpert Xpress SARS-CoV-2/FLU/RSV plus assay is intended as an aid in the diagnosis of influenza from Nasopharyngeal swab specimens and should not be used as a sole basis for treatment. Nasal washings and aspirates are unacceptable for Xpert Xpress SARS-CoV-2/FLU/RSV testing.  Fact Sheet for Patients: BloggerCourse.com  Fact Sheet for Healthcare Providers: SeriousBroker.it  This test is not yet approved or cleared by the [place] FDA and has been authorized for detection and/or diagnosis of SARS-CoV-2 by FDA under an Emergency Use Authorization (EUA). This EUA will remain in effect (meaning this test can be used) for the duration of the COVID-19 declaration under Section 564(b)(1) of the Act, 21 U.S.C. section 360bbb-3(b)(1), unless the authorization is terminated or revoked.     Resp Syncytial Virus by PCR NEGATIVE NEGATIVE    Comment: (NOTE) Fact Sheet for Patients: BloggerCourse.com  Fact Sheet for Healthcare Providers: SeriousBroker.it  This test is not yet approved or cleared by the [place] FDA and has been authorized for detection and/or diagnosis of SARS-CoV-2 by FDA under an Emergency Use Authorization (EUA). This EUA will remain in effect (meaning this test can be used) for the duration of the COVID-19 declaration under Section 564(b)(1) of the Act, 21 U.S.C. section 360bbb-3(b)(1), unless the authorization is terminated or revoked.  Performed at [facility]  Lab, [address] [place], [place] [address]   Urinalysis, Routine w reflex microscopic -Urine, Clean Catch     Status: Abnormal   Collection Time: 03/19/23  3:58 AM  Result Value Ref Range   Color, Urine STRAW (A) YELLOW   APPearance CLEAR CLEAR   Specific Gravity, Urine 1.012 1.005 - 1.030   pH 8.0 5.0 - 8.0   Glucose, UA NEGATIVE NEGATIVE mg/dL   Hgb urine dipstick MODERATE (A) NEGATIVE   Bilirubin Urine NEGATIVE NEGATIVE   Ketones, ur NEGATIVE NEGATIVE mg/dL   Protein, ur NEGATIVE NEGATIVE mg/dL   Nitrite NEGATIVE NEGATIVE   Leukocytes,Ua NEGATIVE NEGATIVE   RBC / HPF 0-5 0 - 5 RBC/hpf   WBC, UA 0-5 0 - 5 WBC/hpf   Bacteria, UA NONE SEEN NONE SEEN   Squamous Epithelial / HPF 0-5 0 - 5 /HPF    Comment: Performed at [facility]  Lab, [address], [place], [place] [address]  Rapid urine drug screen (hospital performed)     Status: None   Collection Time: 03/19/23  3:58 AM  Result Value Ref Range   Opiates NONE DETECTED NONE DETECTED   Cocaine NONE DETECTED NONE DETECTED   Benzodiazepines NONE DETECTED NONE DETECTED   Amphetamines NONE DETECTED NONE DETECTED   Tetrahydrocannabinol NONE DETECTED NONE DETECTED   Barbiturates NONE DETECTED NONE DETECTED    Comment: (NOTE) DRUG SCREEN FOR MEDICAL PURPOSES ONLY.  IF CONFIRMATION IS NEEDED FOR ANY PURPOSE, NOTIFY LAB WITHIN 5 DAYS.  LOWEST DETECTABLE LIMITS FOR URINE DRUG SCREEN Drug Class                     Cutoff (ng/mL) Amphetamine and metabolites    1000 Barbiturate and metabolites    200 Benzodiazepine                 200 Opiates and metabolites        300 Cocaine and metabolites        300 THC                            50 Performed at [facility] Lab, [address], [place], [place] [address]   I-Stat CG4 Lactic Acid     Status: None   Collection Time: 03/19/23  4:22 AM  Result Value Ref Range   Lactic Acid, Venous 1.3 0.5 - 1.9 mmol/L  CBC with Differential/Platelet     Status: Abnormal   Collection  Time: 03/19/23  6:54 AM  Result Value Ref Range   WBC 9.2 4.0 - 10.5 K/uL   RBC 4.27 3.87 - 5.11 MIL/uL   Hemoglobin 12.6 12.0 - 15.0 g/dL   HCT 95.1 88.4 - 16.6 %   MCV 92.7 80.0 - 100.0 fL   MCH 29.5 26.0 - 34.0 pg   MCHC 31.8 30.0 - 36.0 g/dL   RDW 06.3 01.6 - 01.0 %   Platelets 147 (L) 150 - 400 K/uL   nRBC 0.0 0.0 - 0.2 %   Neutrophils Relative % 78 %   Neutro Abs 7.0 1.7 - 7.7 K/uL   Lymphocytes Relative 13 %   Lymphs Abs 1.2 0.7 - 4.0 K/uL   Monocytes Relative 8 %   Monocytes Absolute 0.8 0.1 - 1.0 K/uL   Eosinophils Relative 1 %   Eosinophils Absolute 0.1 0.0 - 0.5 K/uL   Basophils Relative 0 %   Basophils Absolute 0.0 0.0 - 0.1 K/uL   Immature Granulocytes 0 %   Abs Immature Granulocytes 0.04 0.00 - 0.07 K/uL    Comment: Performed at [facility], [address], [place], [place] [address]  Comprehensive metabolic panel     Status: Abnormal   Collection Time: 03/19/23  6:54 AM  Result Value Ref Range   Sodium 146 (H) 135 - 145 mmol/L   Potassium 3.8 3.5 - 5.1 mmol/L   Chloride 115 (H) 98 - 111 mmol/L   CO2 25 22 - 32 mmol/L   Glucose, Bld 104 (H) 70 - 99 mg/dL    Comment: Glucose reference range applies only to samples taken after fasting for at least 8 hours.   BUN 12 8 - 23 mg/dL   Creatinine, Ser 5.73 (H) 0.44 - 1.00 mg/dL   Calcium 9.1 8.9 - 22.0 mg/dL   Total Protein 5.6 (L) 6.5 - 8.1 g/dL  Albumin 2.9 (L) 3.5 - 5.0 g/dL   AST 17 15 - 41 U/L   ALT 18 0 - 44 U/L   Alkaline Phosphatase 51 38 - 126 U/L   Total Bilirubin 0.5 <1.2 mg/dL   GFR, Estimated 40 (L) >60 mL/min    Comment: (NOTE) Calculated using the CKD-EPI Creatinine Equation (2021)    Anion gap 6 5 - 15    Comment: Performed at [facility] Lab, [address], [place], [place] [address]  Magnesium     Status: None   Collection Time: 03/19/23  6:54 AM  Result Value Ref Range   Magnesium 2.1 1.7 - 2.4 mg/dL    Comment: Performed at [facility] Lab, [address], [place],  [place] [address]   MR BRAIN WO CONTRAST Result Date: 03/19/2023 CLINICAL DATA:  Delirium. EXAM: MRI HEAD WITHOUT CONTRAST TECHNIQUE: Multiplanar, multiecho pulse sequences of the brain and surrounding structures were obtained without intravenous contrast. COMPARISON:  Head CT 03/19/2023.  MRI brain 12/16/2022. FINDINGS: Brain: No acute infarct or hemorrhage. Stable moderate chronic small-vessel disease. No mass or midline shift. No hydrocephalus or extra-axial collection. No abnormal susceptibility. Vascular: Normal flow voids. Skull and upper cervical spine: Normal marrow signal. Sinuses/Orbits: No acute findings. Other: None. IMPRESSION: 1. No acute intracranial process. 2. Stable moderate chronic small-vessel disease. Electronically Signed   By: Orvan Falconer M.D.   On: 03/19/2023 08:15   CT ABDOMEN PELVIS W CONTRAST Result Date: 03/19/2023 CLINICAL DATA:  Acute abdominal pain. EXAM: CT ABDOMEN AND PELVIS WITH CONTRAST TECHNIQUE: Multidetector CT imaging of the abdomen and pelvis was performed using the standard protocol following bolus administration of intravenous contrast. RADIATION DOSE REDUCTION: This exam was performed according to the departmental dose-optimization program which includes automated exposure control, adjustment of the mA and/or kV according to patient size and/or use of iterative reconstruction technique. CONTRAST:  75mL OMNIPAQUE IOHEXOL 350 MG/ML SOLN COMPARISON:  Remote CT 1123 L5 FINDINGS: Lower chest: No acute findings allowing for motion artifact. Hepatobiliary: Subcapsular cysts in the left lobe of the liver, largest measuring 18 mm. No suspicious liver lesion. Prior cholecystectomy. There is intra and extrahepatic biliary ductal dilatation. The common bile duct measures 19 mm at the porta hepatis. No visualized choledocholithiasis. Pancreas: Fatty atrophy.  No ductal dilatation or inflammation. Spleen: Normal in size without focal abnormality. Adrenals/Urinary Tract: No adrenal  nodule. No hydronephrosis. Homogeneous renal enhancement. No evidence of renal inflammation. No visible stone or focal lesion. Unremarkable urinary bladder. No bladder wall thickening. Stomach/Bowel: Decompressed stomach. No bowel obstruction or inflammation. Prominent sigmoid diverticulosis without focal diverticulitis. Small to moderate volume of stool in the colon. The appendix is not definitively seen, no appendicitis. Vascular/Lymphatic: Aortic atherosclerosis without aneurysm. The portal vein is patent. No acute vascular findings. No abdominopelvic adenopathy. Reproductive: Status post hysterectomy. No adnexal masses. Other: No free air or ascites. Diminutive fat containing umbilical hernia. Musculoskeletal: Left hip arthroplasty. Right hip osteoarthritis. Mild to moderate degenerative change throughout the spine. IMPRESSION: 1. Intra and extrahepatic biliary ductal dilatation, likely due to postcholecystectomy state. Recommend correlation with liver function tests. If LFTs are normal, no further evaluation is needed. If LFTs are elevated, MRCP is the study of choice, however should only be pursued if patient is able to tolerate breath hold technique. 2. Sigmoid diverticulosis without diverticulitis. Aortic Atherosclerosis (ICD10-I70.0). Electronically Signed   By: Narda Rutherford M.D.   On: 03/19/2023 00:40   DG Chest Portable 1 View Result Date: 03/19/2023 CLINICAL DATA:  Altered mental status EXAM: PORTABLE CHEST 1 VIEW COMPARISON:  12/16/2022 FINDINGS: Cardiac shadow is stable. Aortic calcifications are noted. Lungs are well aerated bilaterally. No focal infiltrate or sizable effusion is seen. Postsurgical changes are noted in the cervical spine. IMPRESSION: No active disease. Electronically Signed   By: Alcide Clever M.D.   On: 03/19/2023 00:35   CT Head Wo Contrast Result Date: 03/19/2023 CLINICAL DATA:  Delirium EXAM: CT HEAD WITHOUT CONTRAST CT CERVICAL SPINE WITHOUT CONTRAST TECHNIQUE:  Multidetector CT imaging of the head and cervical spine was performed following the standard protocol without intravenous contrast. Multiplanar CT image reconstructions of the cervical spine were also generated. RADIATION DOSE REDUCTION: This exam was performed according to the departmental dose-optimization program which includes automated exposure control, adjustment of the mA and/or kV according to patient size and/or use of iterative reconstruction technique. COMPARISON:  None Available. FINDINGS: CT HEAD FINDINGS Brain: There is no mass, hemorrhage or extra-axial collection. There is generalized atrophy without lobar predilection. Hypodensity of the white matter is most commonly associated with chronic microvascular disease. Vascular: Atherosclerotic calcification of the internal carotid arteries at the skull base. No abnormal hyperdensity of the major intracranial arteries or dural venous sinuses. Skull: The visualized skull base, calvarium and extracranial soft tissues are normal. Sinuses/Orbits: No fluid levels or advanced mucosal thickening of the visualized paranasal sinuses. No mastoid or middle ear effusion. Normal orbits. Other: None. CT CERVICAL SPINE FINDINGS Alignment: No static subluxation. Facets are aligned. Occipital condyles are normally positioned. Skull base and vertebrae: No acute fracture.  C3-7 ACDF. Soft tissues and spinal canal: No prevertebral fluid or swelling. No visible canal hematoma. Disc levels: No advanced spinal canal or neural foraminal stenosis. Upper chest: No pneumothorax, pulmonary nodule or pleural effusion. Other: Normal visualized paraspinal cervical soft tissues. IMPRESSION: 1. No acute intracranial abnormality. 2. No acute fracture or static subluxation of the cervical spine. 3. C3-7 ACDF without hardware complication. Electronically Signed   By: Deatra Robinson M.D.   On: 03/19/2023 00:33   CT Cervical Spine Wo Contrast Result Date: 03/19/2023 CLINICAL DATA:   Delirium EXAM: CT HEAD WITHOUT CONTRAST CT CERVICAL SPINE WITHOUT CONTRAST TECHNIQUE: Multidetector CT imaging of the head and cervical spine was performed following the standard protocol without intravenous contrast. Multiplanar CT image reconstructions of the cervical spine were also generated. RADIATION DOSE REDUCTION: This exam was performed according to the departmental dose-optimization program which includes automated exposure control, adjustment of the mA and/or kV according to patient size and/or use of iterative reconstruction technique. COMPARISON:  None Available. FINDINGS: CT HEAD FINDINGS Brain: There is no mass, hemorrhage or extra-axial collection. There is generalized atrophy without lobar predilection. Hypodensity of the white matter is most commonly associated with chronic microvascular disease. Vascular: Atherosclerotic calcification of the internal carotid arteries at the skull base. No abnormal hyperdensity of the major intracranial arteries or dural venous sinuses. Skull: The visualized skull base, calvarium and extracranial soft tissues are normal. Sinuses/Orbits: No fluid levels or advanced mucosal thickening of the visualized paranasal sinuses. No mastoid or middle ear effusion. Normal orbits. Other: None. CT CERVICAL SPINE FINDINGS Alignment: No static subluxation. Facets are aligned. Occipital condyles are normally positioned. Skull base and vertebrae: No acute fracture.  C3-7 ACDF. Soft tissues and spinal canal: No prevertebral fluid or swelling. No visible canal hematoma. Disc levels: No advanced spinal canal or neural foraminal stenosis. Upper chest: No pneumothorax, pulmonary nodule or pleural effusion. Other: Normal visualized paraspinal cervical soft tissues. IMPRESSION: 1. No  acute intracranial abnormality. 2. No acute fracture or static subluxation of the cervical spine. 3. C3-7 ACDF without hardware complication. Electronically Signed   By: Deatra Robinson M.D.   On: 03/19/2023  00:33    Pending Labs Unresulted Labs (From admission, onward)    None       Vitals/Pain Today's Vitals   03/19/23 0530 03/19/23 0656 03/19/23 0700 03/19/23 0845  BP: (!) 108/53  (!) 100/50 (!) 140/81  Pulse: (!) 51  (!) 50 97  Resp: 16  11   Temp:  98.7 F (37.1 C)    TempSrc:  Oral    SpO2: 97%  94% 94%  Weight:      Height:        Isolation Precautions No active isolations  Medications Medications  acetaminophen (TYLENOL) tablet 650 mg (has no administration in time range)    Or  acetaminophen (TYLENOL) suppository 650 mg (has no administration in time range)  melatonin tablet 3 mg (has no administration in time range)  ondansetron (ZOFRAN) injection 4 mg (has no administration in time range)  iohexol (OMNIPAQUE) 350 MG/ML injection 75 mL (75 mLs Intravenous Contrast Given 03/19/23 0026)  sodium chloride 0.9 % bolus 1,000 mL (0 mLs Intravenous Stopped 03/19/23 0301)  ondansetron (ZOFRAN) injection 4 mg (4 mg Intravenous Given 03/19/23 0359)    Mobility walks     Focused Assessments Neuro Assessment Handoff:  Swallow screen pass? Yes          Neuro Assessment: Exceptions to WDL Neuro Checks:      Has TPA been given? No If patient is a Neuro Trauma and patient is going to OR before floor call report to 4N Charge nurse: [phone] or [phone]   R Recommendations: See Admitting Provider Note  Report given to:   Additional Notes:

## 2023-03-19 NOTE — Progress Notes (Signed)
  Carryover admission to the Day Admitter.  I discussed this case with the EDP, Dr. Chelsea Aus.  Per these discussions:   This is a 82 year old female with history of atrial fibrillation chronically anticoagulated on Eliquis, who is being admitted with acute encephalopathy of unclear source after presenting with 1 day of confusion relative to her baseline mental status.  She went to bed on the evening of Saturday, 03/17/2023 in her normal state of health and baseline mental status, before awakening on the morning of Sunday, 03/18/2023, confused, with ensuing evidence of somnolence.  She has also had some recent nausea/vomiting which is new for her.  In this context, she reportedly experienced a ground-level fall in which she hit her head around 2000 on  12/29, prompting her to be brought to [facility] emergency department.  It is unclear if she lost consciousness as a component of this fall.   Patient medications notable for tramadol, baclofen.   EDP conveys no evidence of acute focal neurologic deficits on exam.  CT head showed no evidence of acute intracranial process.  EDP is ordered MRI brain to further assess patient's acute encephalopathy without obvious underlying source at this time.  Urinalysis was inconsistent with UTI.  I have placed an order for inpatient admission to med/tele for further evaluation and management of the above.  I have placed some additional preliminary admit orders via the adult multi-morbid admission order set. I have also ordered morning labs in the form of CMP, CBC, and magnesium level.  I have also ordered fall precautions for her.    Newton Pigg, DO Hospitalist

## 2023-03-19 NOTE — ED Notes (Signed)
Patient transported to MRI 

## 2023-03-19 NOTE — ED Notes (Signed)
Trauma Response Nurse Documentation   Bridget [name] is a 82 y.o. female arriving to [facility] ED via POV  On Eliquis (apixaban) daily. Trauma was activated as a Level 2 by EDP based on the following trauma criteria Elderly patients > 65 with head trauma on anti-coagulation (excluding ASA).  Patient cleared for CT by Dr. Wallace Cullens. Pt transported to CT with trauma response nurse present to monitor. RN remained with the patient throughout their absence from the department for clinical observation.   GCS 14.  Trauma MD Arrival Time: N/A.  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     Past Surgical History:  Procedure Laterality Date   ATRIAL FIBRILLATION ABLATION N/A 12/16/2019   Procedure: ATRIAL FIBRILLATION ABLATION;  Surgeon: Hillis Range, MD;  Location: [facility] INVASIVE CV LAB;  Service: Cardiovascular;  Laterality: N/A;   CARDIOVERSION N/A 05/02/2019   Procedure: CARDIOVERSION;  Surgeon: Chrystie Nose, MD;  Location: [facility] ENDOSCOPY;  Service: Cardiovascular;  Laterality: N/A;   CARDIOVERSION N/A 05/14/2019   Procedure: CARDIOVERSION;  Surgeon: Parke Poisson, MD;  Location: [facility] ENDOSCOPY;  Service: Cardiovascular;  Laterality: N/A;   CATARACT EXTRACTION, BILATERAL     CERVICAL DISCECTOMY  2005   with fusion   CHOLECYSTECTOMY  2006   COLONOSCOPY     LUMBAR LAMINECTOMY/DECOMPRESSION MICRODISCECTOMY Right 11/13/2014   Procedure: Right Lumbar four-five Microdiskectomy;  Surgeon: Hilda Lias, MD;  Location: [facility] NEURO ORS;  Service: Neurosurgery;  Laterality: Right;  right   POLYPECTOMY     REPLACEMENT TOTAL KNEE Right 05/2012   TEE WITHOUT CARDIOVERSION N/A 05/02/2019   Procedure: TRANSESOPHAGEAL ECHOCARDIOGRAM (TEE);  Surgeon: Chrystie Nose, MD;  Location: [facility] ENDOSCOPY;  Service: Cardiovascular;  Laterality: N/A;   TOTAL ABDOMINAL HYSTERECTOMY  1978   TOTAL HIP ARTHROPLASTY Left 04/2011   TRANSTHORACIC ECHOCARDIOGRAM  12/2008   EF=>55%, borderline conc LVH; trace MR; mod TR; trace AV regurg       Initial Focused Assessment (If applicable, or please see trauma documentation): Airway-- intact, no visible obstruction Breathing-- spontaneous, unlabored Circulation-- no obvious bleeding noted  CT's Completed:   CT Head, CT C-Spine, and CT abdomen/pelvis w/ contrast   Interventions:  See event summary  Plan for disposition:  Admission to floor   Consults completed:  none at 0335.  Event Summary: Patient arrived POV, initial complaint for possible UTI. Per family patient with fall this evening approx 8 pm. Activated as Level 2 Trauma at this time by EDP. Manual BP obtained. 20 G PIV LAC established, lab work obtained. Xray chest completed. Patient to CT with TRN and Primary RN. CT head, c-spine, abdomen/pelvis completed.  MTP Summary (If applicable):  N/A  Bedside handoff with ED RN Tiffany.    Bridget [name]  Trauma Response RN  Please call TRN at [phone] for further assistance.

## 2023-03-19 NOTE — Progress Notes (Signed)
Transition of Care [facility]) - CAGE-AID Screening   Patient Details  Name: PADMA HERKERT MRN: [id number] Date of Birth: 06/01/1940  Transition of Care [facility]) CM/SW Contact:    [name], RN Phone Number: 03/19/2023, 11:35 PM   Clinical Narrative:  Patient denies the use of alcohol and illicit substances. Resources not given at this time.  CAGE-AID Screening:    Have You Ever Felt You Ought to Cut Down on Your Drinking or Drug Use?: No Have People Annoyed You By Critizing Your Drinking Or Drug Use?: No Have You Felt Bad Or Guilty About Your Drinking Or Drug Use?: No Have You Ever Had a Drink or Used Drugs First Thing In The Morning to Steady Your Nerves or to Get Rid of a Hangover?: No CAGE-AID Score: 0  Substance Abuse Education Offered: No

## 2023-03-19 NOTE — Progress Notes (Signed)
New Admission Note:   Arrival Method: Stretcher Mental Orientation: Alert and oriented  IV: Pain: Right shoulder   Safety Measures: Safety Fall Prevention Plan has been given, discussed and signed [facility] [place] Orientation: Patient has been orientated to the room, unit and staff.  Family:  Orders have been reviewed and implemented. Will continue to monitor the patient. Call light has been placed within reach and bed alarm has been activated.   Minna Merritts RN

## 2023-03-20 DIAGNOSIS — G9340 Encephalopathy, unspecified: Secondary | ICD-10-CM | POA: Diagnosis not present

## 2023-03-20 LAB — COMPREHENSIVE METABOLIC PANEL
ALT: 16 U/L (ref 0–44)
AST: 14 U/L — ABNORMAL LOW (ref 15–41)
Albumin: 2.8 g/dL — ABNORMAL LOW (ref 3.5–5.0)
Alkaline Phosphatase: 55 U/L (ref 38–126)
Anion gap: 11 (ref 5–15)
BUN: 12 mg/dL (ref 8–23)
CO2: 24 mmol/L (ref 22–32)
Calcium: 9.2 mg/dL (ref 8.9–10.3)
Chloride: 108 mmol/L (ref 98–111)
Creatinine, Ser: 1.33 mg/dL — ABNORMAL HIGH (ref 0.44–1.00)
GFR, Estimated: 40 mL/min — ABNORMAL LOW (ref >60)
Glucose, Bld: 99 mg/dL (ref 70–99)
Potassium: 4.3 mmol/L (ref 3.5–5.1)
Sodium: 143 mmol/L (ref 135–145)
Total Bilirubin: 0.6 mg/dL (ref 0.0–1.2)
Total Protein: 5.9 g/dL — ABNORMAL LOW (ref 6.5–8.1)

## 2023-03-20 LAB — CBC WITH DIFFERENTIAL/PLATELET
Abs Immature Granulocytes: 0.04 10*3/uL (ref 0.00–0.07)
Basophils Absolute: 0 10*3/uL (ref 0.0–0.1)
Basophils Relative: 0 %
Eosinophils Absolute: 0.2 10*3/uL (ref 0.0–0.5)
Eosinophils Relative: 2 %
HCT: 42.9 % (ref 36.0–46.0)
Hemoglobin: 13.6 g/dL (ref 12.0–15.0)
Immature Granulocytes: 1 %
Lymphocytes Relative: 18 %
Lymphs Abs: 1.5 10*3/uL (ref 0.7–4.0)
MCH: 28.9 pg (ref 26.0–34.0)
MCHC: 31.7 g/dL (ref 30.0–36.0)
MCV: 91.1 fL (ref 80.0–100.0)
Monocytes Absolute: 0.8 10*3/uL (ref 0.1–1.0)
Monocytes Relative: 9 %
Neutro Abs: 5.9 10*3/uL (ref 1.7–7.7)
Neutrophils Relative %: 70 %
Platelets: 149 10*3/uL — ABNORMAL LOW (ref 150–400)
RBC: 4.71 MIL/uL (ref 3.87–5.11)
RDW: 15.3 % (ref 11.5–15.5)
WBC: 8.4 10*3/uL (ref 4.0–10.5)
nRBC: 0 % (ref 0.0–0.2)

## 2023-03-20 MED ORDER — CEPHALEXIN 500 MG PO CAPS: 500.0000 mg | ORAL_CAPSULE | Freq: Three times a day (TID) | ORAL | Status: DC

## 2023-03-20 MED ORDER — ONDANSETRON HCL 4 MG PO TABS: 4.0000 mg | ORAL_TABLET | Freq: Three times a day (TID) | ORAL | 0 refills | Status: AC | PRN

## 2023-03-20 NOTE — Plan of Care (Signed)

## 2023-03-20 NOTE — Evaluation (Signed)
 Physical Therapy Evaluation Patient Details Name: Bridget [name] MRN: [id number] DOB: [birth year] Today's Date: 03/20/2023  History of Present Illness  admitted after days long bout with N/V, fall, AMS;  has a past medical history of Allergy, Arthritis, Degenerative disc disease, GERD (gastroesophageal reflux disease), Hyperlipidemia, Hypertension, Hypothyroidism, Migraines, Neuromuscular disorder (HCC), Neuropathy, Nonischemic cardiomyopathy (HCC), Persistent atrial fibrillation/flutter, Seasonal allergies, and Skin cancer (2003).  Clinical Impression   Pt admitted with above diagnosis. Lives at home with husband, in a two-level home (can stay on main floor) with 1 steps to enter; Prior to admission, pt was able to manage independnely, walking without assistive device (though she has a cane and a RW; Presents to PT with Generalized weakness and fatigue; decr balance; Light mod assist to help pt to eOB; mod assist to stabilize with sit to stand and to walk a few feet in room with RW; tending to have a posterior bias with standign and taking steps; Pt's daughter tells me she will have adequate assist at home -- emphasized that based on today, I recommend someone be right beside her when she's walking;  Pt currently with functional limitations due to the deficits listed below (see PT Problem List). Pt will benefit from skilled PT to increase their independence and safety with mobility to allow discharge to the venue listed below.           If plan is discharge home, recommend the following: A lot of help with walking and/or transfers;Help with stairs or ramp for entrance;A little help with bathing/dressing/bathroom   Can travel by private vehicle        Equipment Recommendations None recommended by PT (and can defer to HHPT as well)  Recommendations for Other Services       Functional Status Assessment Patient has had a recent decline in their functional status and demonstrates the ability to  make significant improvements in function in a reasonable and predictable amount of time.     Precautions / Restrictions Precautions Precautions: Fall Restrictions Weight Bearing Restrictions Per Provider Order: No      Mobility  Bed Mobility Overal bed mobility: Needs Assistance Bed Mobility: Supine to Sit     Supine to sit: Mod assist     General bed mobility comments: Mod handheld assist to pull to sit    Transfers Overall transfer level: Needs assistance Equipment used: Rolling walker (2 wheels) Transfers: Sit to/from Stand Sit to Stand: Mod assist           General transfer comment: Good power to rise; light mod assist to support due to posterior lean    Ambulation/Gait Ambulation/Gait assistance: Min assist Gait Distance (Feet): 4 Feet Assistive device: Rolling walker (2 wheels) Gait Pattern/deviations: Decreased step length - right, Decreased step length - left, Shuffle       General Gait Details: Posterior bias with short steps  Stairs            Wheelchair Mobility     Tilt Bed    Modified Rankin (Stroke Patients Only)       Balance Overall balance assessment: Needs assistance   Sitting balance-Leahy Scale: Fair       Standing balance-Leahy Scale: Poor Standing balance comment: posterior lean                             Pertinent Vitals/Pain Pain Assessment Pain Assessment: No/denies pain    Home Living Family/patient expects to be discharged  to:: Private residence Living Arrangements: Spouse/significant other Available Help at Discharge: Family;Available 24 hours/day Type of Home: House Home Access: Stairs to enter Entrance Stairs-Rails: None Entrance Stairs-Number of Steps: 1   Home Layout: Able to live on main level with bedroom/bathroom Home Equipment: [employer] (2 wheels) Additional Comments: Pt' sdaughter reports she sleeps on teh sofa    Prior Function               Mobility Comments:  Typically does not walk with assistive device, but has a cane and RW ADLs Comments: Reports independence with bathing; walk in shower     Extremity/Trunk Assessment   Upper Extremity Assessment Upper Extremity Assessment: Generalized weakness    Lower Extremity Assessment Lower Extremity Assessment: Generalized weakness       Communication   Communication Communication: No apparent difficulties  Cognition Arousal: Alert Behavior During Therapy: Flat affect Overall Cognitive Status: Impaired/Different from baseline Area of Impairment: Following commands                       Following Commands: Follows one step commands with increased time       General Comments: Slow processing        General Comments General comments (skin integrity, edema, etc.): Daughter, Lorn, present and helpful    Exercises     Assessment/Plan    PT Assessment Patient needs continued PT services  PT Problem List Decreased strength;Decreased activity tolerance;Decreased balance;Decreased mobility;Decreased coordination;Decreased cognition;Decreased knowledge of use of DME;Decreased safety awareness;Decreased knowledge of precautions       PT Treatment Interventions DME instruction;Gait training;Stair training;Functional mobility training;Therapeutic activities;Therapeutic exercise;Balance training;Neuromuscular re-education;Cognitive remediation;Patient/family education    PT Goals (Current goals can be found in the Care Plan section)  Acute Rehab PT Goals Patient Stated Goal: HOme soon PT Goal Formulation: With patient/family Time For Goal Achievement: 04/03/23 Potential to Achieve Goals: Good    Frequency Min 1X/week     Co-evaluation               AM-PAC PT 6 Clicks Mobility  Outcome Measure Help needed turning from your back to your side while in a flat bed without using bedrails?: A Little Help needed moving from lying on your back to sitting on the side of a  flat bed without using bedrails?: A Little Help needed moving to and from a bed to a chair (including a wheelchair)?: A Lot Help needed standing up from a chair using your arms (e.g., wheelchair or bedside chair)?: A Lot Help needed to walk in hospital room?: A Lot Help needed climbing 3-5 steps with a railing? : A Little 6 Click Score: 15    End of Session Equipment Utilized During Treatment: Gait belt Activity Tolerance: Patient tolerated treatment well Patient left: in chair;with call bell/phone within reach;with family/visitor present Nurse Communication: Mobility status PT Visit Diagnosis: Unsteadiness on feet (R26.81);Muscle weakness (generalized) (M62.81)    Time: 1152-1229 PT Time Calculation (min) (ACUTE ONLY): 37 min   Charges:   PT Evaluation $PT Eval Moderate Complexity: 1 Mod PT Treatments $Therapeutic Activity: 8-22 mins PT General Charges $$ ACUTE PT VISIT: 1 Visit         Silvano Currier, PT  Acute Rehabilitation Services Office [phone] Secure Chat welcomed   Silvano VEAR Currier 03/20/2023, 3:06 PM

## 2023-03-20 NOTE — Progress Notes (Signed)
AVS printed and reviewed with patient all questions answered.  Patient waiting for equipment to be delivered.

## 2023-03-20 NOTE — TOC Transition Note (Signed)
 Transition of Care [facility]) - Discharge Note   Patient Details  Name: Bridget [name] MRN: [id number] Date of Birth: 02/28/1941  Transition of Care [facility] CM/SW Contact:  Andrez JULIANNA George, RN Phone Number: 03/20/2023, 2:56 PM   Clinical Narrative:     Pt is from home with her spouse. Someone is with her most of the time.  DME at home: cane/ walker/ shower seat Spouse and daughter provide needed transportation.  Daughter manages her medications at home.  Pt is discharging home with home health services arranged through [facility]. Daughter states they have used [facility] in the past and asked to use them again. Information on the AVS. Hedda will contact them for the first home visit. Daughter will transport home.  Final next level of care: Home w Home Health Services Barriers to Discharge: No Barriers Identified   Patient Goals and CMS Choice   CMS Medicare.gov Compare Post Acute Care list provided to:: Patient Represenative (must comment) Choice offered to / list presented to : Adult Children      Discharge Placement                       Discharge Plan and Services Additional resources added to the After Visit Summary for                            [facility] Arranged: PT, OT [facility] Agency: [facility] Date [facility] Agency Contacted: 03/20/23   Representative spoke with at [facility] Agency: [place]  Social Drivers of Health (SDOH) Interventions SDOH Screenings   Food Insecurity: No Food Insecurity (03/19/2023)  Housing: Low Risk  (03/19/2023)  Transportation Needs: No Transportation Needs (03/19/2023)  Utilities: Not At Risk (03/19/2023)  Social Connections: Unknown (03/19/2023)  Tobacco Use: Low Risk  (01/10/2023)     Readmission Risk Interventions     No data to display

## 2023-03-20 NOTE — Progress Notes (Signed)
 DISCHARGE NOTE HOME Bridget [name] to be discharged Home per MD order. Discussed prescriptions and follow up appointments with the patient. Prescriptions given to patient; medication list explained in detail. Patient verbalized understanding.  Skin clean, dry and intact without evidence of skin break down, no evidence of skin tears noted. IV catheter discontinued intact. Site without signs and symptoms of complications. Dressing and pressure applied. Pt denies pain at the site currently. No complaints noted.  Patient free of lines, drains, and wounds.   An After Visit Summary (AVS) was printed and given to the patient. Patient escorted via wheelchair, and discharged home via private auto.  [name], RN

## 2023-03-21 LAB — URINE CULTURE

## 2023-03-21 NOTE — Discharge Summary (Signed)
 Physician Discharge Summary   Patient: Bridget [name] MRN: [id number] DOB: Jan 27, 1941  Admit date:     03/18/2023  Discharge date: 03/20/2023  Discharge Physician: Lonni SHAUNNA Dalton   PCP: Ina Marcellus RAMAN, MD     Recommendations at discharge:  Follow up with PCP Dr. Ina in 1 week for gastroenteritis     Discharge Diagnoses: Principal Problem:   Acute metabolic encephalopathy due to dehydration Active Problems:   Nausea & vomiting, possible gastroenteritis   Hypernatremia   Hyperchloremia   Paroxysmal atrial fibrillation (HCC)   Thrombocytopenia (HCC)   Fall at home, initial encounter   Hypothyroidism   Depression   Dementia [facility])     Hospital Course: [name] is an 83 year old F with HTN, AF on Eliquis , hypothyroidism, and mild dementia, lives at home who presented with few days of nausea and vomiting.  She became progressively more sluggish, and altered so family brought her to the ER.  In the ER, she was hypernatremic, hyperchloremic.  Urinalysis showed no pyuria, white cells, or bacteria.  CT head and C-spine was normal, CT abdomen and pelvis showed no acute findings, stigmata of prior cholecystectomy.  Influenza, COVID and RSV were negative.     Acute metabolic encephalopathy due to dehydration, likely gastroenteritis Acute cystitis ruled out Patient was admitted and given IV fluids.  She improved to her baseline, was able to take orals well.  MRI brain was obtained that showed no acute infarct.  LFTs normal.  PT worked with the patient and recommended home health physical therapy.  Urine culture was obtained that grew multiple species, likely contaminants or asymptomatic bacteriuria given the setting of normal urinalysis, no symptoms.              The [place]  Controlled Substances Registry was reviewed for this patient prior to discharge.  Consultants: None Procedures performed:  CT head CT abdomen and pelvis MRI brain    Disposition: Home health Diet recommendation:  Discharge Diet Orders (From admission, onward)     Start     Ordered   03/20/23 0000  Diet - low sodium heart healthy        03/20/23 1532             DISCHARGE MEDICATION: Allergies as of 03/20/2023       Reactions   Codeine Anaphylaxis   Other Reaction(s): GI Intolerance   Sulfonamide Derivatives Anaphylaxis   Triptans Other (See Comments)   Stopped blood supply to mesenteric artery per pt Other Reaction(s): Other (See Comments)   Amlodipine Other (See Comments)   swelling   Chocolate Other (See Comments)   Headache   Onion Other (See Comments)   Headache   Other    MSG- headaches        Medication List     TAKE these medications    amiodarone  200 MG tablet Commonly known as: PACERONE  Take 1 tablet (200 mg total) by mouth daily.   ARTIFICIAL TEARS PF OP Place 1 drop into both eyes daily as needed (For dry eyes).   baclofen  10 MG tablet Commonly known as: LIORESAL  Take 0.5 tablets by mouth as needed (as needed for migraine pain and neuropathy.).   Biofreeze Cool The Pain 4 % Gel Generic drug: Menthol  (Topical Analgesic) Apply 1 Application topically as needed (neck pain).   diltiazem  240 MG 24 hr capsule Commonly known as: CARDIZEM  CD TAKE 1 CAPSULE BY MOUTH EVERY DAY   Eliquis  5 MG Tabs tablet Generic drug: apixaban   TAKE 1 TABLET BY MOUTH TWICE A DAY   Klor-Con  M20 20 MEQ tablet Generic drug: potassium chloride  SA TAKE 1 TABLET (20 MEQ TOTAL) BY MOUTH AS NEEDED (TAKE ON DAYS LASIX  IS TAKEN). What changed: See the new instructions.   levothyroxine  75 MCG tablet Commonly known as: SYNTHROID  Take 75 mcg by mouth daily before breakfast.   ondansetron  4 MG tablet Commonly known as: ZOFRAN  Take 1 tablet (4 mg total) by mouth every 8 (eight) hours as needed for nausea or vomiting.   primidone  50 MG tablet Commonly known as: MYSOLINE  Take 50 mg by mouth at bedtime.   rosuvastatin  20 MG  tablet Commonly known as: CRESTOR  TAKE 1 TABLET BY MOUTH EVERY DAY What changed: when to take this   sertraline  25 MG tablet Commonly known as: ZOLOFT  Take 25 mg by mouth at bedtime.   traMADol  50 MG tablet Commonly known as: ULTRAM  Take 50-100 mg by mouth every 4 (four) hours as needed for moderate pain (as needed for migraine pain and neuropathy.).        Follow-up Information     Care, [facility] Follow up.   Specialty: Home Health Services Why: The home health agency will contact you for the first home visit Contact information: [address] [place] [address] [phone]                 Discharge Instructions     Diet - low sodium heart healthy   Complete by: As directed    Discharge instructions   Complete by: As directed    **IMPORTANT DISCHARGE INSTRUCTIONS**   From Dr. Jonel: You were admitted for sleepiness and confusion and sluggishness.  Here, we found that this was from dehdyration.  This kind of delirium is common in those in their 82s when they have an illness  Your CT head and MRI brain were normal and ruled out stroke or bleeding  Your CT abdomen showed changes from your old gallbladder removal, but your liver function tests were normal  Your white blood cell count was normal and urinalysis was normal  I suspect you had a viral gastroenteritis (a stomach bug) and that this illness caused you to be confused  It looks like it ran its course, and you are feeling better.  Resume all your home medicines and go see your primary in 1 week  CAUTION: both baclofen  and tramadol  have some exagerrated effects in those in their 82s and can worsen confusion  Be cautious with thesse for the next few days  I have written a short prescription for ondansetron /Zofran  for you, to use as needed if you have more nausea   If you don't have issues with this, you don't need to fill it   Increase activity slowly   Complete by:  As directed    No wound care   Complete by: As directed        Discharge Exam: Filed Weights   03/18/23 2311  Weight: 71.7 kg    General: Pt is alert, awake, not in acute distress Cardiovascular: RRR, nl S1-S2, no murmurs appreciated.   No LE edema.   Respiratory: Normal respiratory rate and rhythm.  CTAB without rales or wheezes. Abdominal: Abdomen soft and non-tender.  No distension or HSM.   Neuro/Psych: Strength symmetric in upper and lower extremities.  Judgment and insight appear mildly impaired but at baseline.   Condition at discharge: fair  The results of significant diagnostics from this hospitalization (including  imaging, microbiology, ancillary and laboratory) are listed below for reference.   Imaging Studies: MR BRAIN WO CONTRAST Result Date: 03/19/2023 CLINICAL DATA:  Delirium. EXAM: MRI HEAD WITHOUT CONTRAST TECHNIQUE: Multiplanar, multiecho pulse sequences of the brain and surrounding structures were obtained without intravenous contrast. COMPARISON:  Head CT 03/19/2023.  MRI brain 12/16/2022. FINDINGS: Brain: No acute infarct or hemorrhage. Stable moderate chronic small-vessel disease. No mass or midline shift. No hydrocephalus or extra-axial collection. No abnormal susceptibility. Vascular: Normal flow voids. Skull and upper cervical spine: Normal marrow signal. Sinuses/Orbits: No acute findings. Other: None. IMPRESSION: 1. No acute intracranial process. 2. Stable moderate chronic small-vessel disease. Electronically Signed   By: Ryan Chess M.D.   On: 03/19/2023 08:15   CT ABDOMEN PELVIS W CONTRAST Result Date: 03/19/2023 CLINICAL DATA:  Acute abdominal pain. EXAM: CT ABDOMEN AND PELVIS WITH CONTRAST TECHNIQUE: Multidetector CT imaging of the abdomen and pelvis was performed using the standard protocol following bolus administration of intravenous contrast. RADIATION DOSE REDUCTION: This exam was performed according to the departmental dose-optimization program  which includes automated exposure control, adjustment of the mA and/or kV according to patient size and/or use of iterative reconstruction technique. CONTRAST:  75mL OMNIPAQUE  IOHEXOL  350 MG/ML SOLN COMPARISON:  Remote CT 1123 L5 FINDINGS: Lower chest: No acute findings allowing for motion artifact. Hepatobiliary: Subcapsular cysts in the left lobe of the liver, largest measuring 18 mm. No suspicious liver lesion. Prior cholecystectomy. There is intra and extrahepatic biliary ductal dilatation. The common bile duct measures 19 mm at the porta hepatis. No visualized choledocholithiasis. Pancreas: Fatty atrophy.  No ductal dilatation or inflammation. Spleen: Normal in size without focal abnormality. Adrenals/Urinary Tract: No adrenal nodule. No hydronephrosis. Homogeneous renal enhancement. No evidence of renal inflammation. No visible stone or focal lesion. Unremarkable urinary bladder. No bladder wall thickening. Stomach/Bowel: Decompressed stomach. No bowel obstruction or inflammation. Prominent sigmoid diverticulosis without focal diverticulitis. Small to moderate volume of stool in the colon. The appendix is not definitively seen, no appendicitis. Vascular/Lymphatic: Aortic atherosclerosis without aneurysm. The portal vein is patent. No acute vascular findings. No abdominopelvic adenopathy. Reproductive: Status post hysterectomy. No adnexal masses. Other: No free air or ascites. Diminutive fat containing umbilical hernia. Musculoskeletal: Left hip arthroplasty. Right hip osteoarthritis. Mild to moderate degenerative change throughout the spine. IMPRESSION: 1. Intra and extrahepatic biliary ductal dilatation, likely due to postcholecystectomy state. Recommend correlation with liver function tests. If LFTs are normal, no further evaluation is needed. If LFTs are elevated, MRCP is the study of choice, however should only be pursued if patient is able to tolerate breath hold technique. 2. Sigmoid diverticulosis  without diverticulitis. Aortic Atherosclerosis (ICD10-I70.0). Electronically Signed   By: Andrea Gasman M.D.   On: 03/19/2023 00:40   DG Chest Portable 1 View Result Date: 03/19/2023 CLINICAL DATA:  Altered mental status EXAM: PORTABLE CHEST 1 VIEW COMPARISON:  12/16/2022 FINDINGS: Cardiac shadow is stable. Aortic calcifications are noted. Lungs are well aerated bilaterally. No focal infiltrate or sizable effusion is seen. Postsurgical changes are noted in the cervical spine. IMPRESSION: No active disease. Electronically Signed   By: Oneil Devonshire M.D.   On: 03/19/2023 00:35   CT Head Wo Contrast Result Date: 03/19/2023 CLINICAL DATA:  Delirium EXAM: CT HEAD WITHOUT CONTRAST CT CERVICAL SPINE WITHOUT CONTRAST TECHNIQUE: Multidetector CT imaging of the head and cervical spine was performed following the standard protocol without intravenous contrast. Multiplanar CT image reconstructions of the cervical spine were also generated. RADIATION DOSE REDUCTION: This exam was  performed according to the departmental dose-optimization program which includes automated exposure control, adjustment of the mA and/or kV according to patient size and/or use of iterative reconstruction technique. COMPARISON:  None Available. FINDINGS: CT HEAD FINDINGS Brain: There is no mass, hemorrhage or extra-axial collection. There is generalized atrophy without lobar predilection. Hypodensity of the white matter is most commonly associated with chronic microvascular disease. Vascular: Atherosclerotic calcification of the internal carotid arteries at the skull base. No abnormal hyperdensity of the major intracranial arteries or dural venous sinuses. Skull: The visualized skull base, calvarium and extracranial soft tissues are normal. Sinuses/Orbits: No fluid levels or advanced mucosal thickening of the visualized paranasal sinuses. No mastoid or middle ear effusion. Normal orbits. Other: None. CT CERVICAL SPINE FINDINGS Alignment: No  static subluxation. Facets are aligned. Occipital condyles are normally positioned. Skull base and vertebrae: No acute fracture.  C3-7 ACDF. Soft tissues and spinal canal: No prevertebral fluid or swelling. No visible canal hematoma. Disc levels: No advanced spinal canal or neural foraminal stenosis. Upper chest: No pneumothorax, pulmonary nodule or pleural effusion. Other: Normal visualized paraspinal cervical soft tissues. IMPRESSION: 1. No acute intracranial abnormality. 2. No acute fracture or static subluxation of the cervical spine. 3. C3-7 ACDF without hardware complication. Electronically Signed   By: Franky Stanford M.D.   On: 03/19/2023 00:33   CT Cervical Spine Wo Contrast Result Date: 03/19/2023 CLINICAL DATA:  Delirium EXAM: CT HEAD WITHOUT CONTRAST CT CERVICAL SPINE WITHOUT CONTRAST TECHNIQUE: Multidetector CT imaging of the head and cervical spine was performed following the standard protocol without intravenous contrast. Multiplanar CT image reconstructions of the cervical spine were also generated. RADIATION DOSE REDUCTION: This exam was performed according to the departmental dose-optimization program which includes automated exposure control, adjustment of the mA and/or kV according to patient size and/or use of iterative reconstruction technique. COMPARISON:  None Available. FINDINGS: CT HEAD FINDINGS Brain: There is no mass, hemorrhage or extra-axial collection. There is generalized atrophy without lobar predilection. Hypodensity of the white matter is most commonly associated with chronic microvascular disease. Vascular: Atherosclerotic calcification of the internal carotid arteries at the skull base. No abnormal hyperdensity of the major intracranial arteries or dural venous sinuses. Skull: The visualized skull base, calvarium and extracranial soft tissues are normal. Sinuses/Orbits: No fluid levels or advanced mucosal thickening of the visualized paranasal sinuses. No mastoid or middle ear  effusion. Normal orbits. Other: None. CT CERVICAL SPINE FINDINGS Alignment: No static subluxation. Facets are aligned. Occipital condyles are normally positioned. Skull base and vertebrae: No acute fracture.  C3-7 ACDF. Soft tissues and spinal canal: No prevertebral fluid or swelling. No visible canal hematoma. Disc levels: No advanced spinal canal or neural foraminal stenosis. Upper chest: No pneumothorax, pulmonary nodule or pleural effusion. Other: Normal visualized paraspinal cervical soft tissues. IMPRESSION: 1. No acute intracranial abnormality. 2. No acute fracture or static subluxation of the cervical spine. 3. C3-7 ACDF without hardware complication. Electronically Signed   By: Franky Stanford M.D.   On: 03/19/2023 00:33    Microbiology: Results for orders placed or performed during the hospital encounter of 03/18/23  Resp panel by RT-PCR (RSV, Flu A&B, Covid) Anterior Nasal Swab     Status: None   Collection Time: 03/19/23 12:50 AM   Specimen: Anterior Nasal Swab  Result Value Ref Range Status   SARS Coronavirus 2 by RT PCR NEGATIVE NEGATIVE Final   Influenza A by PCR NEGATIVE NEGATIVE Final   Influenza B by PCR NEGATIVE NEGATIVE Final  Comment: (NOTE) The Xpert Xpress SARS-CoV-2/FLU/RSV plus assay is intended as an aid in the diagnosis of influenza from Nasopharyngeal swab specimens and should not be used as a sole basis for treatment. Nasal washings and aspirates are unacceptable for Xpert Xpress SARS-CoV-2/FLU/RSV testing.  Fact Sheet for Patients: bloggercourse.com  Fact Sheet for Healthcare Providers: seriousbroker.it  This test is not yet approved or cleared by the [place]  FDA and has been authorized for detection and/or diagnosis of SARS-CoV-2 by FDA under an Emergency Use Authorization (EUA). This EUA will remain in effect (meaning this test can be used) for the duration of the COVID-19 declaration under Section  564(b)(1) of the Act, 21 U.S.C. section 360bbb-3(b)(1), unless the authorization is terminated or revoked.     Resp Syncytial Virus by PCR NEGATIVE NEGATIVE Final    Comment: (NOTE) Fact Sheet for Patients: bloggercourse.com  Fact Sheet for Healthcare Providers: seriousbroker.it  This test is not yet approved or cleared by the [place]  FDA and has been authorized for detection and/or diagnosis of SARS-CoV-2 by FDA under an Emergency Use Authorization (EUA). This EUA will remain in effect (meaning this test can be used) for the duration of the COVID-19 declaration under Section 564(b)(1) of the Act, 21 U.S.C. section 360bbb-3(b)(1), unless the authorization is terminated or revoked.  Performed at [facility], [address], [place], [place] [address]   Urine Culture (for pregnant, neutropenic or urologic patients or patients with an indwelling urinary catheter)     Status: Abnormal   Collection Time: 03/19/23  3:58 AM   Specimen: Urine, Clean Catch  Result Value Ref Range Status   Specimen Description URINE, CLEAN CATCH  Final   Special Requests   Final    NONE Performed at [facility], [address], [place], [place] [address]    Culture MULTIPLE SPECIES PRESENT, SUGGEST RECOLLECTION (A)  Final   Report Status 03/21/2023 FINAL  Final    Labs: CBC: Recent Labs  Lab 03/18/23 2338 03/19/23 0006 03/19/23 0011 03/19/23 0654 03/20/23 0808  WBC 9.9  --   --  9.2 8.4  NEUTROABS 7.9*  --   --  7.0 5.9  HGB 14.2 13.9 13.9 12.6 13.6  HCT 45.6 41.0 41.0 39.6 42.9  MCV 94.2  --   --  92.7 91.1  PLT 161  --   --  147* 149*   Basic Metabolic Panel: Recent Labs  Lab 03/18/23 2338 03/19/23 0006 03/19/23 0011 03/19/23 0654 03/20/23 0808  NA 144 145 144 146* 143  K 3.7 3.6 3.7 3.8 4.3  CL 113* 113*  --  115* 108  CO2 21*  --   --  25 24  GLUCOSE 118* 116*  --  104* 99  BUN 14 15  --  12 12   CREATININE 1.39* 1.40*  --  1.34* 1.33*  CALCIUM  9.6  --   --  9.1 9.2  MG  --   --   --  2.1  --    Liver Function Tests: Recent Labs  Lab 03/18/23 2338 03/19/23 0654 03/20/23 0808  AST 22 17 14*  ALT 20 18 16   ALKPHOS 60 51 55  BILITOT 0.3 0.5 0.6  PROT 6.3* 5.6* 5.9*  ALBUMIN 3.3* 2.9* 2.8*   CBG: No results for input(s): GLUCAP in the last 168 hours.  Discharge time spent: approximately 45 minutes spent on discharge counseling, evaluation of patient on day of discharge, and coordination of discharge planning with nursing,  social work, pharmacy and case management  Signed: Lonni SHAUNNA Dalton, MD [facility] 03/21/2023

## 2023-04-26 DIAGNOSIS — M542 Cervicalgia: Secondary | ICD-10-CM | POA: Diagnosis not present

## 2023-04-26 DIAGNOSIS — G8929 Other chronic pain: Secondary | ICD-10-CM | POA: Diagnosis not present

## 2023-04-26 DIAGNOSIS — R519 Headache, unspecified: Secondary | ICD-10-CM | POA: Diagnosis not present

## 2023-04-26 DIAGNOSIS — R413 Other amnesia: Secondary | ICD-10-CM | POA: Diagnosis not present

## 2023-04-26 DIAGNOSIS — G629 Polyneuropathy, unspecified: Secondary | ICD-10-CM | POA: Diagnosis not present

## 2023-04-27 DIAGNOSIS — H109 Unspecified conjunctivitis: Secondary | ICD-10-CM | POA: Diagnosis not present

## 2023-04-27 DIAGNOSIS — Z6823 Body mass index (BMI) 23.0-23.9, adult: Secondary | ICD-10-CM | POA: Diagnosis not present

## 2023-05-25 DIAGNOSIS — R300 Dysuria: Secondary | ICD-10-CM | POA: Diagnosis not present

## 2023-05-28 DIAGNOSIS — C009 Malignant neoplasm of lip, unspecified: Secondary | ICD-10-CM | POA: Diagnosis not present

## 2023-07-14 ENCOUNTER — Other Ambulatory Visit: Payer: Self-pay | Admitting: Internal Medicine

## 2023-07-16 NOTE — Telephone Encounter (Signed)
 Prescription refill request for Eliquis  received. Indication: A Flutter Last office visit: 01/10/23  Claudell Cruz MD Scr: 1.33 on 03/20/23  Epic Age: 83 Weight: 71.7kg  Based on above findings Eliquis  5mg  twice daily is the appropriate dose.  Refill approved.

## 2023-07-20 DIAGNOSIS — R531 Weakness: Secondary | ICD-10-CM | POA: Diagnosis not present

## 2023-07-20 DIAGNOSIS — Z6823 Body mass index (BMI) 23.0-23.9, adult: Secondary | ICD-10-CM | POA: Diagnosis not present

## 2023-07-20 DIAGNOSIS — R2689 Other abnormalities of gait and mobility: Secondary | ICD-10-CM | POA: Diagnosis not present

## 2023-07-30 DIAGNOSIS — R319 Hematuria, unspecified: Secondary | ICD-10-CM | POA: Diagnosis not present

## 2023-07-30 DIAGNOSIS — R55 Syncope and collapse: Secondary | ICD-10-CM | POA: Diagnosis not present

## 2023-07-30 DIAGNOSIS — R000 Tachycardia, unspecified: Secondary | ICD-10-CM | POA: Diagnosis not present

## 2023-07-30 DIAGNOSIS — R001 Bradycardia, unspecified: Secondary | ICD-10-CM | POA: Diagnosis not present

## 2023-07-31 DIAGNOSIS — Z79899 Other long term (current) drug therapy: Secondary | ICD-10-CM | POA: Diagnosis not present

## 2023-07-31 DIAGNOSIS — E039 Hypothyroidism, unspecified: Secondary | ICD-10-CM | POA: Diagnosis not present

## 2023-07-31 DIAGNOSIS — E785 Hyperlipidemia, unspecified: Secondary | ICD-10-CM | POA: Diagnosis not present

## 2023-07-31 DIAGNOSIS — I517 Cardiomegaly: Secondary | ICD-10-CM | POA: Diagnosis not present

## 2023-07-31 DIAGNOSIS — I959 Hypotension, unspecified: Secondary | ICD-10-CM | POA: Diagnosis not present

## 2023-07-31 DIAGNOSIS — E876 Hypokalemia: Secondary | ICD-10-CM | POA: Diagnosis not present

## 2023-07-31 DIAGNOSIS — N179 Acute kidney failure, unspecified: Secondary | ICD-10-CM | POA: Diagnosis not present

## 2023-07-31 DIAGNOSIS — F32A Depression, unspecified: Secondary | ICD-10-CM | POA: Diagnosis not present

## 2023-07-31 DIAGNOSIS — R319 Hematuria, unspecified: Secondary | ICD-10-CM | POA: Diagnosis not present

## 2023-07-31 DIAGNOSIS — R339 Retention of urine, unspecified: Secondary | ICD-10-CM | POA: Diagnosis not present

## 2023-07-31 DIAGNOSIS — R000 Tachycardia, unspecified: Secondary | ICD-10-CM | POA: Diagnosis not present

## 2023-07-31 DIAGNOSIS — Z7901 Long term (current) use of anticoagulants: Secondary | ICD-10-CM | POA: Diagnosis not present

## 2023-07-31 DIAGNOSIS — I4729 Other ventricular tachycardia: Secondary | ICD-10-CM | POA: Diagnosis not present

## 2023-07-31 DIAGNOSIS — I4891 Unspecified atrial fibrillation: Secondary | ICD-10-CM | POA: Diagnosis not present

## 2023-07-31 DIAGNOSIS — R410 Disorientation, unspecified: Secondary | ICD-10-CM | POA: Diagnosis not present

## 2023-07-31 DIAGNOSIS — R41841 Cognitive communication deficit: Secondary | ICD-10-CM | POA: Diagnosis not present

## 2023-07-31 DIAGNOSIS — W19XXXA Unspecified fall, initial encounter: Secondary | ICD-10-CM | POA: Diagnosis not present

## 2023-07-31 DIAGNOSIS — R531 Weakness: Secondary | ICD-10-CM | POA: Diagnosis not present

## 2023-07-31 DIAGNOSIS — S7002XD Contusion of left hip, subsequent encounter: Secondary | ICD-10-CM | POA: Diagnosis not present

## 2023-07-31 DIAGNOSIS — R55 Syncope and collapse: Secondary | ICD-10-CM | POA: Diagnosis not present

## 2023-07-31 DIAGNOSIS — Z7409 Other reduced mobility: Secondary | ICD-10-CM | POA: Diagnosis not present

## 2023-07-31 DIAGNOSIS — S7002XA Contusion of left hip, initial encounter: Secondary | ICD-10-CM | POA: Diagnosis not present

## 2023-07-31 DIAGNOSIS — R1311 Dysphagia, oral phase: Secondary | ICD-10-CM | POA: Diagnosis not present

## 2023-07-31 DIAGNOSIS — I10 Essential (primary) hypertension: Secondary | ICD-10-CM | POA: Diagnosis not present

## 2023-07-31 DIAGNOSIS — Z87440 Personal history of urinary (tract) infections: Secondary | ICD-10-CM | POA: Diagnosis not present

## 2023-07-31 DIAGNOSIS — F0390 Unspecified dementia without behavioral disturbance: Secondary | ICD-10-CM | POA: Diagnosis not present

## 2023-07-31 DIAGNOSIS — R2681 Unsteadiness on feet: Secondary | ICD-10-CM | POA: Diagnosis not present

## 2023-07-31 DIAGNOSIS — G8929 Other chronic pain: Secondary | ICD-10-CM | POA: Diagnosis not present

## 2023-07-31 DIAGNOSIS — E861 Hypovolemia: Secondary | ICD-10-CM | POA: Diagnosis not present

## 2023-07-31 DIAGNOSIS — F0393 Unspecified dementia, unspecified severity, with mood disturbance: Secondary | ICD-10-CM | POA: Diagnosis not present

## 2023-07-31 DIAGNOSIS — Z741 Need for assistance with personal care: Secondary | ICD-10-CM | POA: Diagnosis not present

## 2023-07-31 DIAGNOSIS — M6281 Muscle weakness (generalized): Secondary | ICD-10-CM | POA: Diagnosis not present

## 2023-07-31 DIAGNOSIS — I951 Orthostatic hypotension: Secondary | ICD-10-CM | POA: Diagnosis not present

## 2023-08-02 DIAGNOSIS — R000 Tachycardia, unspecified: Secondary | ICD-10-CM | POA: Diagnosis not present

## 2023-08-02 DIAGNOSIS — I517 Cardiomegaly: Secondary | ICD-10-CM | POA: Diagnosis not present

## 2023-08-03 DIAGNOSIS — I4891 Unspecified atrial fibrillation: Secondary | ICD-10-CM | POA: Diagnosis not present

## 2023-08-04 DIAGNOSIS — R55 Syncope and collapse: Secondary | ICD-10-CM | POA: Diagnosis not present

## 2023-08-04 DIAGNOSIS — G8929 Other chronic pain: Secondary | ICD-10-CM | POA: Diagnosis not present

## 2023-08-04 DIAGNOSIS — M6281 Muscle weakness (generalized): Secondary | ICD-10-CM | POA: Diagnosis not present

## 2023-08-04 DIAGNOSIS — E039 Hypothyroidism, unspecified: Secondary | ICD-10-CM | POA: Diagnosis not present

## 2023-08-04 DIAGNOSIS — S7000XA Contusion of unspecified hip, initial encounter: Secondary | ICD-10-CM | POA: Insufficient documentation

## 2023-08-04 DIAGNOSIS — Z741 Need for assistance with personal care: Secondary | ICD-10-CM | POA: Diagnosis not present

## 2023-08-04 DIAGNOSIS — R531 Weakness: Secondary | ICD-10-CM | POA: Diagnosis not present

## 2023-08-04 DIAGNOSIS — R1311 Dysphagia, oral phase: Secondary | ICD-10-CM | POA: Diagnosis not present

## 2023-08-04 DIAGNOSIS — I4891 Unspecified atrial fibrillation: Secondary | ICD-10-CM | POA: Diagnosis not present

## 2023-08-04 DIAGNOSIS — I959 Hypotension, unspecified: Secondary | ICD-10-CM | POA: Diagnosis not present

## 2023-08-04 DIAGNOSIS — R339 Retention of urine, unspecified: Secondary | ICD-10-CM | POA: Diagnosis not present

## 2023-08-04 DIAGNOSIS — F0390 Unspecified dementia without behavioral disturbance: Secondary | ICD-10-CM | POA: Diagnosis not present

## 2023-08-04 DIAGNOSIS — F32A Depression, unspecified: Secondary | ICD-10-CM | POA: Diagnosis not present

## 2023-08-04 DIAGNOSIS — S7002XD Contusion of left hip, subsequent encounter: Secondary | ICD-10-CM | POA: Diagnosis not present

## 2023-08-04 DIAGNOSIS — R410 Disorientation, unspecified: Secondary | ICD-10-CM | POA: Diagnosis not present

## 2023-08-04 DIAGNOSIS — R2681 Unsteadiness on feet: Secondary | ICD-10-CM | POA: Diagnosis not present

## 2023-08-04 DIAGNOSIS — R41841 Cognitive communication deficit: Secondary | ICD-10-CM | POA: Diagnosis not present

## 2023-08-04 DIAGNOSIS — E785 Hyperlipidemia, unspecified: Secondary | ICD-10-CM | POA: Diagnosis not present

## 2023-08-04 DIAGNOSIS — Z7409 Other reduced mobility: Secondary | ICD-10-CM | POA: Diagnosis not present

## 2023-08-04 DIAGNOSIS — N179 Acute kidney failure, unspecified: Secondary | ICD-10-CM | POA: Diagnosis not present

## 2023-08-06 DIAGNOSIS — I4891 Unspecified atrial fibrillation: Secondary | ICD-10-CM | POA: Diagnosis not present

## 2023-08-06 DIAGNOSIS — E039 Hypothyroidism, unspecified: Secondary | ICD-10-CM | POA: Diagnosis not present

## 2023-08-06 DIAGNOSIS — R55 Syncope and collapse: Secondary | ICD-10-CM | POA: Diagnosis not present

## 2023-08-06 DIAGNOSIS — N179 Acute kidney failure, unspecified: Secondary | ICD-10-CM | POA: Diagnosis not present

## 2023-08-07 DIAGNOSIS — I959 Hypotension, unspecified: Secondary | ICD-10-CM | POA: Diagnosis not present

## 2023-08-07 DIAGNOSIS — I4891 Unspecified atrial fibrillation: Secondary | ICD-10-CM | POA: Diagnosis not present

## 2023-08-07 DIAGNOSIS — R55 Syncope and collapse: Secondary | ICD-10-CM | POA: Diagnosis not present

## 2023-08-07 DIAGNOSIS — E039 Hypothyroidism, unspecified: Secondary | ICD-10-CM | POA: Diagnosis not present

## 2023-08-10 DIAGNOSIS — E039 Hypothyroidism, unspecified: Secondary | ICD-10-CM | POA: Diagnosis not present

## 2023-08-10 DIAGNOSIS — R55 Syncope and collapse: Secondary | ICD-10-CM | POA: Diagnosis not present

## 2023-08-10 DIAGNOSIS — I4891 Unspecified atrial fibrillation: Secondary | ICD-10-CM | POA: Diagnosis not present

## 2023-08-10 DIAGNOSIS — I959 Hypotension, unspecified: Secondary | ICD-10-CM | POA: Diagnosis not present

## 2023-08-17 DIAGNOSIS — Z96651 Presence of right artificial knee joint: Secondary | ICD-10-CM | POA: Diagnosis not present

## 2023-08-17 DIAGNOSIS — Z993 Dependence on wheelchair: Secondary | ICD-10-CM | POA: Diagnosis not present

## 2023-08-17 DIAGNOSIS — F0283 Dementia in other diseases classified elsewhere, unspecified severity, with mood disturbance: Secondary | ICD-10-CM | POA: Diagnosis not present

## 2023-08-17 DIAGNOSIS — K5790 Diverticulosis of intestine, part unspecified, without perforation or abscess without bleeding: Secondary | ICD-10-CM | POA: Diagnosis not present

## 2023-08-17 DIAGNOSIS — F32A Depression, unspecified: Secondary | ICD-10-CM | POA: Diagnosis not present

## 2023-08-17 DIAGNOSIS — K219 Gastro-esophageal reflux disease without esophagitis: Secondary | ICD-10-CM | POA: Diagnosis not present

## 2023-08-17 DIAGNOSIS — S7002XD Contusion of left hip, subsequent encounter: Secondary | ICD-10-CM | POA: Diagnosis not present

## 2023-08-17 DIAGNOSIS — N179 Acute kidney failure, unspecified: Secondary | ICD-10-CM | POA: Diagnosis not present

## 2023-08-17 DIAGNOSIS — G8929 Other chronic pain: Secondary | ICD-10-CM | POA: Diagnosis not present

## 2023-08-17 DIAGNOSIS — G43909 Migraine, unspecified, not intractable, without status migrainosus: Secondary | ICD-10-CM | POA: Diagnosis not present

## 2023-08-17 DIAGNOSIS — E039 Hypothyroidism, unspecified: Secondary | ICD-10-CM | POA: Diagnosis not present

## 2023-08-17 DIAGNOSIS — I959 Hypotension, unspecified: Secondary | ICD-10-CM | POA: Diagnosis not present

## 2023-08-17 DIAGNOSIS — I4891 Unspecified atrial fibrillation: Secondary | ICD-10-CM | POA: Diagnosis not present

## 2023-08-17 DIAGNOSIS — G608 Other hereditary and idiopathic neuropathies: Secondary | ICD-10-CM | POA: Diagnosis not present

## 2023-08-17 DIAGNOSIS — Z87440 Personal history of urinary (tract) infections: Secondary | ICD-10-CM | POA: Diagnosis not present

## 2023-08-17 DIAGNOSIS — M1990 Unspecified osteoarthritis, unspecified site: Secondary | ICD-10-CM | POA: Diagnosis not present

## 2023-08-17 DIAGNOSIS — Z9181 History of falling: Secondary | ICD-10-CM | POA: Diagnosis not present

## 2023-08-17 DIAGNOSIS — I10 Essential (primary) hypertension: Secondary | ICD-10-CM | POA: Diagnosis not present

## 2023-08-17 DIAGNOSIS — E782 Mixed hyperlipidemia: Secondary | ICD-10-CM | POA: Diagnosis not present

## 2023-08-22 DIAGNOSIS — F0283 Dementia in other diseases classified elsewhere, unspecified severity, with mood disturbance: Secondary | ICD-10-CM | POA: Diagnosis not present

## 2023-08-22 DIAGNOSIS — E039 Hypothyroidism, unspecified: Secondary | ICD-10-CM | POA: Diagnosis not present

## 2023-08-22 DIAGNOSIS — G8929 Other chronic pain: Secondary | ICD-10-CM | POA: Diagnosis not present

## 2023-08-22 DIAGNOSIS — K219 Gastro-esophageal reflux disease without esophagitis: Secondary | ICD-10-CM | POA: Diagnosis not present

## 2023-08-22 DIAGNOSIS — Z87440 Personal history of urinary (tract) infections: Secondary | ICD-10-CM | POA: Diagnosis not present

## 2023-08-22 DIAGNOSIS — G43909 Migraine, unspecified, not intractable, without status migrainosus: Secondary | ICD-10-CM | POA: Diagnosis not present

## 2023-08-22 DIAGNOSIS — Z9181 History of falling: Secondary | ICD-10-CM | POA: Diagnosis not present

## 2023-08-22 DIAGNOSIS — M1990 Unspecified osteoarthritis, unspecified site: Secondary | ICD-10-CM | POA: Diagnosis not present

## 2023-08-22 DIAGNOSIS — E782 Mixed hyperlipidemia: Secondary | ICD-10-CM | POA: Diagnosis not present

## 2023-08-22 DIAGNOSIS — F32A Depression, unspecified: Secondary | ICD-10-CM | POA: Diagnosis not present

## 2023-08-22 DIAGNOSIS — K5790 Diverticulosis of intestine, part unspecified, without perforation or abscess without bleeding: Secondary | ICD-10-CM | POA: Diagnosis not present

## 2023-08-22 DIAGNOSIS — S7002XD Contusion of left hip, subsequent encounter: Secondary | ICD-10-CM | POA: Diagnosis not present

## 2023-08-22 DIAGNOSIS — I10 Essential (primary) hypertension: Secondary | ICD-10-CM | POA: Diagnosis not present

## 2023-08-22 DIAGNOSIS — I959 Hypotension, unspecified: Secondary | ICD-10-CM | POA: Diagnosis not present

## 2023-08-22 DIAGNOSIS — Z96651 Presence of right artificial knee joint: Secondary | ICD-10-CM | POA: Diagnosis not present

## 2023-08-22 DIAGNOSIS — I4891 Unspecified atrial fibrillation: Secondary | ICD-10-CM | POA: Diagnosis not present

## 2023-08-22 DIAGNOSIS — N179 Acute kidney failure, unspecified: Secondary | ICD-10-CM | POA: Diagnosis not present

## 2023-08-22 DIAGNOSIS — G608 Other hereditary and idiopathic neuropathies: Secondary | ICD-10-CM | POA: Diagnosis not present

## 2023-08-22 DIAGNOSIS — Z993 Dependence on wheelchair: Secondary | ICD-10-CM | POA: Diagnosis not present

## 2023-08-23 DIAGNOSIS — R300 Dysuria: Secondary | ICD-10-CM | POA: Diagnosis not present

## 2023-08-23 DIAGNOSIS — G608 Other hereditary and idiopathic neuropathies: Secondary | ICD-10-CM | POA: Diagnosis not present

## 2023-08-31 DIAGNOSIS — F29 Unspecified psychosis not due to a substance or known physiological condition: Secondary | ICD-10-CM | POA: Diagnosis not present

## 2023-08-31 DIAGNOSIS — R1084 Generalized abdominal pain: Secondary | ICD-10-CM | POA: Diagnosis not present

## 2023-08-31 DIAGNOSIS — R001 Bradycardia, unspecified: Secondary | ICD-10-CM | POA: Diagnosis not present

## 2023-08-31 DIAGNOSIS — E876 Hypokalemia: Secondary | ICD-10-CM | POA: Diagnosis not present

## 2023-08-31 DIAGNOSIS — R410 Disorientation, unspecified: Secondary | ICD-10-CM | POA: Diagnosis not present

## 2023-08-31 DIAGNOSIS — R1030 Lower abdominal pain, unspecified: Secondary | ICD-10-CM | POA: Diagnosis not present

## 2023-08-31 DIAGNOSIS — R4182 Altered mental status, unspecified: Secondary | ICD-10-CM | POA: Diagnosis not present

## 2023-08-31 DIAGNOSIS — I444 Left anterior fascicular block: Secondary | ICD-10-CM | POA: Diagnosis not present

## 2023-08-31 DIAGNOSIS — K5730 Diverticulosis of large intestine without perforation or abscess without bleeding: Secondary | ICD-10-CM | POA: Diagnosis not present

## 2023-08-31 DIAGNOSIS — N3001 Acute cystitis with hematuria: Secondary | ICD-10-CM | POA: Diagnosis not present

## 2023-08-31 DIAGNOSIS — R9431 Abnormal electrocardiogram [ECG] [EKG]: Secondary | ICD-10-CM | POA: Diagnosis not present

## 2023-09-01 DIAGNOSIS — N390 Urinary tract infection, site not specified: Secondary | ICD-10-CM | POA: Diagnosis not present

## 2023-09-01 DIAGNOSIS — N179 Acute kidney failure, unspecified: Secondary | ICD-10-CM | POA: Diagnosis not present

## 2023-09-01 DIAGNOSIS — G9341 Metabolic encephalopathy: Secondary | ICD-10-CM | POA: Diagnosis not present

## 2023-09-02 DIAGNOSIS — G9341 Metabolic encephalopathy: Secondary | ICD-10-CM | POA: Diagnosis not present

## 2023-09-02 DIAGNOSIS — N390 Urinary tract infection, site not specified: Secondary | ICD-10-CM | POA: Diagnosis not present

## 2023-09-02 DIAGNOSIS — N179 Acute kidney failure, unspecified: Secondary | ICD-10-CM | POA: Diagnosis not present

## 2023-09-03 DIAGNOSIS — N179 Acute kidney failure, unspecified: Secondary | ICD-10-CM | POA: Diagnosis not present

## 2023-09-03 DIAGNOSIS — G9341 Metabolic encephalopathy: Secondary | ICD-10-CM | POA: Diagnosis not present

## 2023-09-03 DIAGNOSIS — N390 Urinary tract infection, site not specified: Secondary | ICD-10-CM | POA: Diagnosis not present

## 2023-09-04 NOTE — Progress Notes (Signed)
 This encounter was created in error - please disregard.

## 2023-09-05 ENCOUNTER — Ambulatory Visit: Admitting: Pulmonary Disease

## 2023-09-24 DIAGNOSIS — Z09 Encounter for follow-up examination after completed treatment for conditions other than malignant neoplasm: Secondary | ICD-10-CM | POA: Diagnosis not present

## 2023-09-24 DIAGNOSIS — N390 Urinary tract infection, site not specified: Secondary | ICD-10-CM | POA: Diagnosis not present

## 2023-09-24 DIAGNOSIS — Z6820 Body mass index (BMI) 20.0-20.9, adult: Secondary | ICD-10-CM | POA: Diagnosis not present

## 2023-10-04 DIAGNOSIS — Z113 Encounter for screening for infections with a predominantly sexual mode of transmission: Secondary | ICD-10-CM | POA: Diagnosis not present

## 2023-10-17 DIAGNOSIS — N390 Urinary tract infection, site not specified: Secondary | ICD-10-CM | POA: Diagnosis not present

## 2023-10-27 DIAGNOSIS — M1990 Unspecified osteoarthritis, unspecified site: Secondary | ICD-10-CM | POA: Diagnosis not present

## 2023-10-27 DIAGNOSIS — N390 Urinary tract infection, site not specified: Secondary | ICD-10-CM | POA: Diagnosis not present

## 2023-10-27 DIAGNOSIS — E785 Hyperlipidemia, unspecified: Secondary | ICD-10-CM | POA: Diagnosis not present

## 2023-10-27 DIAGNOSIS — E876 Hypokalemia: Secondary | ICD-10-CM | POA: Diagnosis not present

## 2023-10-27 DIAGNOSIS — I499 Cardiac arrhythmia, unspecified: Secondary | ICD-10-CM | POA: Diagnosis not present

## 2023-10-27 DIAGNOSIS — R531 Weakness: Secondary | ICD-10-CM | POA: Diagnosis not present

## 2023-10-27 DIAGNOSIS — U071 COVID-19: Secondary | ICD-10-CM | POA: Diagnosis not present

## 2023-10-27 DIAGNOSIS — I4510 Unspecified right bundle-branch block: Secondary | ICD-10-CM | POA: Diagnosis not present

## 2023-10-27 DIAGNOSIS — Z743 Need for continuous supervision: Secondary | ICD-10-CM | POA: Diagnosis not present

## 2023-10-27 DIAGNOSIS — Z87440 Personal history of urinary (tract) infections: Secondary | ICD-10-CM | POA: Diagnosis not present

## 2023-10-27 DIAGNOSIS — I4892 Unspecified atrial flutter: Secondary | ICD-10-CM | POA: Diagnosis not present

## 2023-10-27 DIAGNOSIS — R112 Nausea with vomiting, unspecified: Secondary | ICD-10-CM | POA: Diagnosis not present

## 2023-10-27 DIAGNOSIS — G8929 Other chronic pain: Secondary | ICD-10-CM | POA: Diagnosis not present

## 2023-10-27 DIAGNOSIS — N183 Chronic kidney disease, stage 3 unspecified: Secondary | ICD-10-CM | POA: Diagnosis not present

## 2023-10-27 DIAGNOSIS — M542 Cervicalgia: Secondary | ICD-10-CM | POA: Diagnosis not present

## 2023-10-27 DIAGNOSIS — E860 Dehydration: Secondary | ICD-10-CM | POA: Diagnosis not present

## 2023-10-27 DIAGNOSIS — I491 Atrial premature depolarization: Secondary | ICD-10-CM | POA: Diagnosis not present

## 2023-10-27 DIAGNOSIS — F0393 Unspecified dementia, unspecified severity, with mood disturbance: Secondary | ICD-10-CM | POA: Diagnosis not present

## 2023-10-27 DIAGNOSIS — I4891 Unspecified atrial fibrillation: Secondary | ICD-10-CM | POA: Diagnosis not present

## 2023-10-27 DIAGNOSIS — R55 Syncope and collapse: Secondary | ICD-10-CM | POA: Diagnosis not present

## 2023-10-27 DIAGNOSIS — E039 Hypothyroidism, unspecified: Secondary | ICD-10-CM | POA: Diagnosis not present

## 2023-10-27 DIAGNOSIS — I482 Chronic atrial fibrillation, unspecified: Secondary | ICD-10-CM | POA: Diagnosis not present

## 2023-10-27 DIAGNOSIS — I129 Hypertensive chronic kidney disease with stage 1 through stage 4 chronic kidney disease, or unspecified chronic kidney disease: Secondary | ICD-10-CM | POA: Diagnosis not present

## 2023-10-27 DIAGNOSIS — Z888 Allergy status to other drugs, medicaments and biological substances status: Secondary | ICD-10-CM | POA: Diagnosis not present

## 2023-10-27 DIAGNOSIS — N179 Acute kidney failure, unspecified: Secondary | ICD-10-CM | POA: Diagnosis not present

## 2023-10-27 DIAGNOSIS — Z7901 Long term (current) use of anticoagulants: Secondary | ICD-10-CM | POA: Diagnosis not present

## 2023-10-27 DIAGNOSIS — M549 Dorsalgia, unspecified: Secondary | ICD-10-CM | POA: Diagnosis not present

## 2023-10-27 DIAGNOSIS — G4489 Other headache syndrome: Secondary | ICD-10-CM | POA: Diagnosis not present

## 2023-10-27 DIAGNOSIS — R001 Bradycardia, unspecified: Secondary | ICD-10-CM | POA: Diagnosis not present

## 2023-10-27 DIAGNOSIS — Z79899 Other long term (current) drug therapy: Secondary | ICD-10-CM | POA: Diagnosis not present

## 2023-10-27 DIAGNOSIS — R000 Tachycardia, unspecified: Secondary | ICD-10-CM | POA: Diagnosis not present

## 2023-10-27 DIAGNOSIS — Z7401 Bed confinement status: Secondary | ICD-10-CM | POA: Diagnosis not present

## 2023-10-27 DIAGNOSIS — I10 Essential (primary) hypertension: Secondary | ICD-10-CM | POA: Diagnosis not present

## 2023-10-27 DIAGNOSIS — I951 Orthostatic hypotension: Secondary | ICD-10-CM | POA: Diagnosis not present

## 2023-10-27 DIAGNOSIS — Z882 Allergy status to sulfonamides status: Secondary | ICD-10-CM | POA: Diagnosis not present

## 2023-10-28 DIAGNOSIS — F0393 Unspecified dementia, unspecified severity, with mood disturbance: Secondary | ICD-10-CM | POA: Diagnosis not present

## 2023-10-28 DIAGNOSIS — I482 Chronic atrial fibrillation, unspecified: Secondary | ICD-10-CM | POA: Diagnosis not present

## 2023-10-28 DIAGNOSIS — U071 COVID-19: Secondary | ICD-10-CM | POA: Diagnosis not present

## 2023-10-29 DIAGNOSIS — I482 Chronic atrial fibrillation, unspecified: Secondary | ICD-10-CM | POA: Diagnosis not present

## 2023-10-29 DIAGNOSIS — F0393 Unspecified dementia, unspecified severity, with mood disturbance: Secondary | ICD-10-CM | POA: Diagnosis not present

## 2023-10-29 DIAGNOSIS — U071 COVID-19: Secondary | ICD-10-CM | POA: Diagnosis not present

## 2023-10-30 DIAGNOSIS — I482 Chronic atrial fibrillation, unspecified: Secondary | ICD-10-CM | POA: Diagnosis not present

## 2023-10-30 DIAGNOSIS — F0393 Unspecified dementia, unspecified severity, with mood disturbance: Secondary | ICD-10-CM | POA: Diagnosis not present

## 2023-10-30 DIAGNOSIS — I4891 Unspecified atrial fibrillation: Secondary | ICD-10-CM | POA: Diagnosis not present

## 2023-10-30 DIAGNOSIS — U071 COVID-19: Secondary | ICD-10-CM | POA: Diagnosis not present

## 2023-10-30 DIAGNOSIS — R55 Syncope and collapse: Secondary | ICD-10-CM | POA: Diagnosis not present

## 2023-10-31 DIAGNOSIS — U071 COVID-19: Secondary | ICD-10-CM | POA: Diagnosis not present

## 2023-10-31 DIAGNOSIS — R55 Syncope and collapse: Secondary | ICD-10-CM

## 2023-10-31 DIAGNOSIS — N390 Urinary tract infection, site not specified: Secondary | ICD-10-CM

## 2023-10-31 DIAGNOSIS — I4891 Unspecified atrial fibrillation: Secondary | ICD-10-CM | POA: Diagnosis not present

## 2023-10-31 DIAGNOSIS — N179 Acute kidney failure, unspecified: Secondary | ICD-10-CM

## 2023-10-31 DIAGNOSIS — I10 Essential (primary) hypertension: Secondary | ICD-10-CM

## 2023-10-31 DIAGNOSIS — I4510 Unspecified right bundle-branch block: Secondary | ICD-10-CM | POA: Diagnosis not present

## 2023-10-31 DIAGNOSIS — I482 Chronic atrial fibrillation, unspecified: Secondary | ICD-10-CM | POA: Diagnosis not present

## 2023-10-31 DIAGNOSIS — F0393 Unspecified dementia, unspecified severity, with mood disturbance: Secondary | ICD-10-CM | POA: Diagnosis not present

## 2023-10-31 DIAGNOSIS — I4892 Unspecified atrial flutter: Secondary | ICD-10-CM | POA: Diagnosis not present

## 2023-11-01 DIAGNOSIS — U071 COVID-19: Secondary | ICD-10-CM | POA: Diagnosis not present

## 2023-11-01 DIAGNOSIS — F0393 Unspecified dementia, unspecified severity, with mood disturbance: Secondary | ICD-10-CM | POA: Diagnosis not present

## 2023-11-01 DIAGNOSIS — I4510 Unspecified right bundle-branch block: Secondary | ICD-10-CM | POA: Diagnosis not present

## 2023-11-01 DIAGNOSIS — N179 Acute kidney failure, unspecified: Secondary | ICD-10-CM | POA: Diagnosis not present

## 2023-11-01 DIAGNOSIS — R55 Syncope and collapse: Secondary | ICD-10-CM | POA: Diagnosis not present

## 2023-11-01 DIAGNOSIS — I482 Chronic atrial fibrillation, unspecified: Secondary | ICD-10-CM | POA: Diagnosis not present

## 2023-11-01 DIAGNOSIS — I10 Essential (primary) hypertension: Secondary | ICD-10-CM | POA: Diagnosis not present

## 2023-11-01 DIAGNOSIS — I4892 Unspecified atrial flutter: Secondary | ICD-10-CM | POA: Diagnosis not present

## 2023-11-02 DIAGNOSIS — R55 Syncope and collapse: Secondary | ICD-10-CM | POA: Diagnosis not present

## 2023-11-02 DIAGNOSIS — R001 Bradycardia, unspecified: Secondary | ICD-10-CM | POA: Diagnosis not present

## 2023-11-02 DIAGNOSIS — I4891 Unspecified atrial fibrillation: Secondary | ICD-10-CM | POA: Diagnosis not present

## 2023-11-02 DIAGNOSIS — U071 COVID-19: Secondary | ICD-10-CM | POA: Diagnosis not present

## 2023-11-02 DIAGNOSIS — I4892 Unspecified atrial flutter: Secondary | ICD-10-CM | POA: Diagnosis not present

## 2023-11-02 DIAGNOSIS — I4510 Unspecified right bundle-branch block: Secondary | ICD-10-CM | POA: Diagnosis not present

## 2023-11-02 DIAGNOSIS — I10 Essential (primary) hypertension: Secondary | ICD-10-CM | POA: Diagnosis not present

## 2023-11-02 DIAGNOSIS — I482 Chronic atrial fibrillation, unspecified: Secondary | ICD-10-CM | POA: Diagnosis not present

## 2023-11-02 DIAGNOSIS — F0393 Unspecified dementia, unspecified severity, with mood disturbance: Secondary | ICD-10-CM | POA: Diagnosis not present

## 2023-11-03 DIAGNOSIS — R001 Bradycardia, unspecified: Secondary | ICD-10-CM | POA: Diagnosis not present

## 2023-11-03 DIAGNOSIS — I4891 Unspecified atrial fibrillation: Secondary | ICD-10-CM | POA: Diagnosis not present

## 2023-11-03 DIAGNOSIS — I10 Essential (primary) hypertension: Secondary | ICD-10-CM | POA: Diagnosis not present

## 2023-11-03 DIAGNOSIS — I482 Chronic atrial fibrillation, unspecified: Secondary | ICD-10-CM | POA: Diagnosis not present

## 2023-11-03 DIAGNOSIS — I4510 Unspecified right bundle-branch block: Secondary | ICD-10-CM | POA: Diagnosis not present

## 2023-11-03 DIAGNOSIS — F0393 Unspecified dementia, unspecified severity, with mood disturbance: Secondary | ICD-10-CM | POA: Diagnosis not present

## 2023-11-03 DIAGNOSIS — U071 COVID-19: Secondary | ICD-10-CM | POA: Diagnosis not present

## 2023-11-03 DIAGNOSIS — R55 Syncope and collapse: Secondary | ICD-10-CM | POA: Diagnosis not present

## 2023-11-04 DIAGNOSIS — I482 Chronic atrial fibrillation, unspecified: Secondary | ICD-10-CM | POA: Diagnosis not present

## 2023-11-04 DIAGNOSIS — R55 Syncope and collapse: Secondary | ICD-10-CM | POA: Diagnosis not present

## 2023-11-04 DIAGNOSIS — I491 Atrial premature depolarization: Secondary | ICD-10-CM

## 2023-11-04 DIAGNOSIS — I10 Essential (primary) hypertension: Secondary | ICD-10-CM | POA: Diagnosis not present

## 2023-11-04 DIAGNOSIS — R001 Bradycardia, unspecified: Secondary | ICD-10-CM | POA: Diagnosis not present

## 2023-11-04 DIAGNOSIS — U071 COVID-19: Secondary | ICD-10-CM | POA: Diagnosis not present

## 2023-11-04 DIAGNOSIS — F0393 Unspecified dementia, unspecified severity, with mood disturbance: Secondary | ICD-10-CM | POA: Diagnosis not present

## 2023-11-04 DIAGNOSIS — I4510 Unspecified right bundle-branch block: Secondary | ICD-10-CM | POA: Diagnosis not present

## 2023-11-04 DIAGNOSIS — I4891 Unspecified atrial fibrillation: Secondary | ICD-10-CM | POA: Diagnosis not present

## 2023-11-05 DIAGNOSIS — I10 Essential (primary) hypertension: Secondary | ICD-10-CM | POA: Diagnosis not present

## 2023-11-05 DIAGNOSIS — U071 COVID-19: Secondary | ICD-10-CM | POA: Diagnosis not present

## 2023-11-05 DIAGNOSIS — I499 Cardiac arrhythmia, unspecified: Secondary | ICD-10-CM | POA: Diagnosis not present

## 2023-11-05 DIAGNOSIS — Z7401 Bed confinement status: Secondary | ICD-10-CM | POA: Diagnosis not present

## 2023-11-05 DIAGNOSIS — Z743 Need for continuous supervision: Secondary | ICD-10-CM | POA: Diagnosis not present

## 2023-11-05 DIAGNOSIS — N179 Acute kidney failure, unspecified: Secondary | ICD-10-CM | POA: Diagnosis not present

## 2023-11-05 DIAGNOSIS — R531 Weakness: Secondary | ICD-10-CM | POA: Diagnosis not present

## 2023-11-05 DIAGNOSIS — I482 Chronic atrial fibrillation, unspecified: Secondary | ICD-10-CM | POA: Diagnosis not present

## 2023-11-05 DIAGNOSIS — F0393 Unspecified dementia, unspecified severity, with mood disturbance: Secondary | ICD-10-CM | POA: Diagnosis not present

## 2023-11-05 DIAGNOSIS — I4891 Unspecified atrial fibrillation: Secondary | ICD-10-CM | POA: Diagnosis not present

## 2023-11-06 DIAGNOSIS — T7840XA Allergy, unspecified, initial encounter: Secondary | ICD-10-CM | POA: Insufficient documentation

## 2023-11-06 DIAGNOSIS — J302 Other seasonal allergic rhinitis: Secondary | ICD-10-CM | POA: Insufficient documentation

## 2023-11-07 ENCOUNTER — Encounter: Payer: Self-pay | Admitting: Cardiology

## 2023-11-07 ENCOUNTER — Ambulatory Visit

## 2023-11-28 ENCOUNTER — Other Ambulatory Visit: Payer: Self-pay | Admitting: Internal Medicine

## 2023-12-23 ENCOUNTER — Other Ambulatory Visit: Payer: Self-pay | Admitting: Internal Medicine

## 2024-01-07 ENCOUNTER — Other Ambulatory Visit: Payer: Self-pay | Admitting: Internal Medicine

## 2024-02-01 DIAGNOSIS — Z79899 Other long term (current) drug therapy: Secondary | ICD-10-CM | POA: Diagnosis not present

## 2024-02-01 DIAGNOSIS — K529 Noninfective gastroenteritis and colitis, unspecified: Secondary | ICD-10-CM | POA: Diagnosis not present

## 2024-02-01 DIAGNOSIS — I4891 Unspecified atrial fibrillation: Secondary | ICD-10-CM | POA: Diagnosis not present

## 2024-02-01 DIAGNOSIS — E785 Hyperlipidemia, unspecified: Secondary | ICD-10-CM | POA: Diagnosis not present

## 2024-02-01 DIAGNOSIS — E43 Unspecified severe protein-calorie malnutrition: Secondary | ICD-10-CM | POA: Diagnosis not present

## 2024-02-01 DIAGNOSIS — F03A0 Unspecified dementia, mild, without behavioral disturbance, psychotic disturbance, mood disturbance, and anxiety: Secondary | ICD-10-CM | POA: Diagnosis not present

## 2024-02-01 DIAGNOSIS — R7401 Elevation of levels of liver transaminase levels: Secondary | ICD-10-CM | POA: Diagnosis not present

## 2024-02-01 DIAGNOSIS — E860 Dehydration: Secondary | ICD-10-CM | POA: Diagnosis not present

## 2024-02-01 DIAGNOSIS — I482 Chronic atrial fibrillation, unspecified: Secondary | ICD-10-CM | POA: Diagnosis not present

## 2024-02-01 DIAGNOSIS — Z888 Allergy status to other drugs, medicaments and biological substances status: Secondary | ICD-10-CM | POA: Diagnosis not present

## 2024-02-01 DIAGNOSIS — Z7401 Bed confinement status: Secondary | ICD-10-CM | POA: Diagnosis not present

## 2024-02-01 DIAGNOSIS — E8809 Other disorders of plasma-protein metabolism, not elsewhere classified: Secondary | ICD-10-CM | POA: Diagnosis not present

## 2024-02-01 DIAGNOSIS — M1990 Unspecified osteoarthritis, unspecified site: Secondary | ICD-10-CM | POA: Diagnosis not present

## 2024-02-01 DIAGNOSIS — Z7901 Long term (current) use of anticoagulants: Secondary | ICD-10-CM | POA: Diagnosis not present

## 2024-02-01 DIAGNOSIS — Z87440 Personal history of urinary (tract) infections: Secondary | ICD-10-CM | POA: Diagnosis not present

## 2024-02-01 DIAGNOSIS — Z681 Body mass index (BMI) 19 or less, adult: Secondary | ICD-10-CM | POA: Diagnosis not present

## 2024-02-01 DIAGNOSIS — E778 Other disorders of glycoprotein metabolism: Secondary | ICD-10-CM | POA: Diagnosis not present

## 2024-02-01 DIAGNOSIS — I10 Essential (primary) hypertension: Secondary | ICD-10-CM | POA: Diagnosis not present

## 2024-02-01 DIAGNOSIS — Z1152 Encounter for screening for COVID-19: Secondary | ICD-10-CM | POA: Diagnosis not present

## 2024-02-01 DIAGNOSIS — Z882 Allergy status to sulfonamides status: Secondary | ICD-10-CM | POA: Diagnosis not present

## 2024-02-01 DIAGNOSIS — R4182 Altered mental status, unspecified: Secondary | ICD-10-CM | POA: Diagnosis not present

## 2024-02-01 DIAGNOSIS — E039 Hypothyroidism, unspecified: Secondary | ICD-10-CM | POA: Diagnosis not present

## 2024-02-01 DIAGNOSIS — E876 Hypokalemia: Secondary | ICD-10-CM | POA: Diagnosis not present

## 2024-02-01 DIAGNOSIS — L89153 Pressure ulcer of sacral region, stage 3: Secondary | ICD-10-CM | POA: Diagnosis not present

## 2024-02-01 DIAGNOSIS — R112 Nausea with vomiting, unspecified: Secondary | ICD-10-CM | POA: Diagnosis not present

## 2024-02-01 DIAGNOSIS — N200 Calculus of kidney: Secondary | ICD-10-CM | POA: Diagnosis not present

## 2024-02-01 DIAGNOSIS — K219 Gastro-esophageal reflux disease without esophagitis: Secondary | ICD-10-CM | POA: Diagnosis not present

## 2024-02-01 DIAGNOSIS — G9341 Metabolic encephalopathy: Secondary | ICD-10-CM | POA: Diagnosis not present

## 2024-02-05 ENCOUNTER — Telehealth: Payer: Self-pay

## 2024-02-05 DIAGNOSIS — L89153 Pressure ulcer of sacral region, stage 3: Secondary | ICD-10-CM | POA: Diagnosis not present

## 2024-02-05 NOTE — Transitions of Care (Post Inpatient/ED Visit) (Signed)
   02/05/2024  Name: Bridget [name] MRN: [id number] DOB: November 09, 1940  Today's TOC FU Call Status: Today's TOC FU Call Status:: Unsuccessful Call (1st Attempt) Unsuccessful Call (1st Attempt) Date: 02/05/24  Attempted to reach the patient regarding the most recent Inpatient/ED visit.  Follow Up Plan: Additional outreach attempts will be made to reach the patient to complete the Transitions of Care (Post Inpatient/ED visit) call.  Shona Prow RN, CCM [place]  VBCI-Population Health RN Care Manager [phone]

## 2024-02-06 ENCOUNTER — Telehealth: Payer: Self-pay

## 2024-02-06 NOTE — Transitions of Care (Post Inpatient/ED Visit) (Signed)
   02/06/2024  Name: Bridget [name] MRN: [id number] DOB: 1940/08/15  Today's TOC FU Call Status: Today's TOC FU Call Status:: Unsuccessful Call (2nd Attempt) Unsuccessful Call (2nd Attempt) Date: 02/06/24  Attempted to reach the patient regarding the most recent Inpatient/ED visit.  Follow Up Plan: Additional outreach attempts will be made to reach the patient to complete the Transitions of Care (Post Inpatient/ED visit) call.   Shona Prow RN, CCM [place]  VBCI-Population Health RN Care Manager [phone]

## 2024-02-07 ENCOUNTER — Telehealth: Payer: Self-pay

## 2024-02-07 NOTE — Transitions of Care (Post Inpatient/ED Visit) (Signed)
   02/07/2024  Name: Bridget [name] MRN: [id number] DOB: 1940/04/28  Today's TOC FU Call Status: Today's TOC FU Call Status:: Unsuccessful Call (3rd Attempt) Unsuccessful Call (3rd Attempt) Date: 02/07/24  Attempted to reach the patient regarding the most recent Inpatient/ED visit.  Follow Up Plan: No further outreach attempts will be made at this time. We have been unable to contact the patient.  Shona Prow RN, CCM [place]  VBCI-Population Health RN Care Manager [phone]

## 2024-02-22 DIAGNOSIS — S31819A Unspecified open wound of right buttock, initial encounter: Secondary | ICD-10-CM | POA: Diagnosis not present

## 2024-02-22 DIAGNOSIS — S31829A Unspecified open wound of left buttock, initial encounter: Secondary | ICD-10-CM | POA: Diagnosis not present

## 2024-02-22 DIAGNOSIS — L89153 Pressure ulcer of sacral region, stage 3: Secondary | ICD-10-CM | POA: Diagnosis not present

## 2024-02-25 DIAGNOSIS — L89153 Pressure ulcer of sacral region, stage 3: Secondary | ICD-10-CM | POA: Diagnosis not present

## 2024-02-25 DIAGNOSIS — S31819A Unspecified open wound of right buttock, initial encounter: Secondary | ICD-10-CM | POA: Diagnosis not present

## 2024-02-25 DIAGNOSIS — S31829A Unspecified open wound of left buttock, initial encounter: Secondary | ICD-10-CM | POA: Diagnosis not present

## 2024-03-07 DIAGNOSIS — S31819A Unspecified open wound of right buttock, initial encounter: Secondary | ICD-10-CM | POA: Diagnosis not present

## 2024-03-07 DIAGNOSIS — S31829A Unspecified open wound of left buttock, initial encounter: Secondary | ICD-10-CM | POA: Diagnosis not present

## 2024-03-27 ENCOUNTER — Other Ambulatory Visit: Payer: Self-pay | Admitting: Cardiology
